# Patient Record
Sex: Male | Born: 1940 | Race: Black or African American | Hispanic: No | Marital: Married | Smoking: Former smoker
Health system: Southern US, Community
[De-identification: ages and names within clinical notes are randomized; demographics above are authoritative.]

## PROBLEM LIST (undated history)

## (undated) DIAGNOSIS — K649 Unspecified hemorrhoids: Secondary | ICD-10-CM

## (undated) DIAGNOSIS — Z9109 Other allergy status, other than to drugs and biological substances: Secondary | ICD-10-CM

## (undated) DIAGNOSIS — K219 Gastro-esophageal reflux disease without esophagitis: Secondary | ICD-10-CM

## (undated) DIAGNOSIS — R351 Nocturia: Secondary | ICD-10-CM

## (undated) DIAGNOSIS — Z87898 Personal history of other specified conditions: Secondary | ICD-10-CM

## (undated) DIAGNOSIS — C61 Malignant neoplasm of prostate: Secondary | ICD-10-CM

## (undated) DIAGNOSIS — N400 Enlarged prostate without lower urinary tract symptoms: Secondary | ICD-10-CM

## (undated) DIAGNOSIS — M1990 Unspecified osteoarthritis, unspecified site: Secondary | ICD-10-CM

## (undated) DIAGNOSIS — R634 Abnormal weight loss: Secondary | ICD-10-CM

## (undated) DIAGNOSIS — IMO0002 Reserved for concepts with insufficient information to code with codable children: Secondary | ICD-10-CM

## (undated) HISTORY — DX: Benign prostatic hyperplasia without lower urinary tract symptoms: N40.0

## (undated) HISTORY — PX: HEMORRHOID BANDING: SHX5850

## (undated) HISTORY — DX: Gastro-esophageal reflux disease without esophagitis: K21.9

## (undated) HISTORY — DX: Abnormal weight loss: R63.4

## (undated) HISTORY — DX: Unspecified hemorrhoids: K64.9

## (undated) HISTORY — DX: Malignant neoplasm of prostate: C61

---

## 2006-10-02 ENCOUNTER — Emergency Department: Payer: Self-pay | Admitting: Unknown Physician Specialty

## 2006-10-02 ENCOUNTER — Other Ambulatory Visit: Payer: Self-pay

## 2013-02-25 ENCOUNTER — Encounter: Payer: Self-pay | Admitting: *Deleted

## 2013-03-07 ENCOUNTER — Ambulatory Visit (INDEPENDENT_AMBULATORY_CARE_PROVIDER_SITE_OTHER): Payer: Medicare Other | Admitting: General Surgery

## 2013-03-07 ENCOUNTER — Encounter: Payer: Self-pay | Admitting: General Surgery

## 2013-03-07 VITALS — BP 128/76 | HR 76 | Resp 14 | Ht 65.0 in | Wt 147.0 lb

## 2013-03-07 DIAGNOSIS — K648 Other hemorrhoids: Secondary | ICD-10-CM

## 2013-03-07 MED ORDER — POLYETHYLENE GLYCOL 3350 17 GM/SCOOP PO POWD: ORAL | Status: DC

## 2013-03-07 NOTE — Patient Instructions (Addendum)
Colonoscopy A colonoscopy is an exam to evaluate your entire colon. In this exam, your colon is cleansed. A long fiberoptic tube is inserted through your rectum and into your colon. The fiberoptic scope (endoscope) is a long bundle of enclosed and very flexible fibers. These fibers transmit light to the area examined and send images from that area to your caregiver. Discomfort is usually minimal. You may be given a drug to help you sleep (sedative) during or prior to the procedure. This exam helps to detect lumps (tumors), polyps, inflammation, and areas of bleeding. Your caregiver may also take a small piece of tissue (biopsy) that will be examined under a microscope. LET YOUR CAREGIVER KNOW ABOUT:   Allergies to food or medicine.  Medicines taken, including vitamins, herbs, eyedrops, over-the-counter medicines, and creams.  Use of steroids (by mouth or creams).  Previous problems with anesthetics or numbing medicines.  History of bleeding problems or blood clots.  Previous surgery.  Other health problems, including diabetes and kidney problems.  Possibility of pregnancy, if this applies. BEFORE THE PROCEDURE   A clear liquid diet may be required for 2 days before the exam.  Ask your caregiver about changing or stopping your regular medications.  Liquid injections (enemas) or laxatives may be required.  A large amount of electrolyte solution may be given to you to drink over a short period of time. This solution is used to clean out your colon.  You should be present 60 minutes prior to your procedure or as directed by your caregiver. AFTER THE PROCEDURE   If you received a sedative or pain relieving medication, you will need to arrange for someone to drive you home.  Occasionally, there is a little blood passed with the first bowel movement. Do not be concerned. FINDING OUT THE RESULTS OF YOUR TEST Not all test results are available during your visit. If your test results are  not back during the visit, make an appointment with your caregiver to find out the results. Do not assume everything is normal if you have not heard from your caregiver or the medical facility. It is important for you to follow up on all of your test results. HOME CARE INSTRUCTIONS   It is not unusual to pass moderate amounts of gas and experience mild abdominal cramping following the procedure. This is due to air being used to inflate your colon during the exam. Walking or a warm pack on your belly (abdomen) may help.  You may resume all normal meals and activities after sedatives and medicines have worn off.  Only take over-the-counter or prescription medicines for pain, discomfort, or fever as directed by your caregiver. Do not use aspirin or blood thinners if a biopsy was taken. Consult your caregiver for medicine usage if biopsies were taken. SEEK IMMEDIATE MEDICAL CARE IF:   You have a fever.  You pass large blood clots or fill a toilet with blood following the procedure. This may also occur 10 to 14 days following the procedure. This is more likely if a biopsy was taken.  You develop abdominal pain that keeps getting worse and cannot be relieved with medicine. Document Released: 07/22/2000 Document Revised: 10/17/2011 Document Reviewed: 03/06/2008 [facility] Patient Information 2014 [place], [place].  Patient has been scheduled for a colonoscopy on 03-13-13 at [facility]. This patient has been asked to discontinue his fish oil today.

## 2013-03-07 NOTE — Progress Notes (Signed)
Patient ID: Keith Bates, male   DOB: March 14, 1941, 72 y.o.   MRN: [id number]  Chief Complaint  Patient presents with  . Other    anal mass    HPI Keith Bates is a 72 y.o. male here for assessment of an anal mass. He has had problems with hemorrhoids for the past 50 years. He states he sometimes gets spotting after wiping. He states that his bowel movements are normally hard. HPI  Past Medical History  Diagnosis Date  . Enlarged prostate     Past Surgical History  Procedure Laterality Date  . Hemorrhoid banding      Family History  Problem Relation Age of Onset  . Prostate cancer Father     Social History History  Substance Use Topics  . Smoking status: Former Smoker    Types: Cigarettes  . Smokeless tobacco: Never Used  . Alcohol Use: No    Allergies  Allergen Reactions  . Penicillins Rash    Current Outpatient Prescriptions  Medication Sig Dispense Refill  . fish oil-omega-3 fatty acids 1000 MG capsule Take 2 g by mouth daily.      . polyethylene glycol powder (GLYCOLAX/MIRALAX) powder 255 grams one bottle for colonoscopy prep  255 g  0   No current facility-administered medications for this visit.    Review of Systems Review of Systems  Constitutional: Negative.   Respiratory: Negative.   Cardiovascular: Negative.     Blood pressure 128/76, pulse 76, resp. rate 14, height 5\' 5"  (1.651 m), weight 147 lb (66.679 kg).  Physical Exam Physical Exam  Constitutional: He is oriented to person, place, and time. He appears well-developed and well-nourished.  Eyes: Conjunctivae are normal. No scleral icterus.  Neck: Neck supple.  Cardiovascular: Normal rate, regular rhythm and normal heart sounds.   Pulses:      Carotid pulses are 2+ on the right side, and 2+ on the left side.      Femoral pulses are 2+ on the right side, and 2+ on the left side.      Popliteal pulses are 2+ on the right side, and 2+ on the left side.  Pulmonary/Chest: Effort normal and breath  sounds normal.  Abdominal: Soft.  Genitourinary: Rectal exam shows internal hemorrhoid (at least 2 , may be 3 , not freely prolapsing.). Prostate is enlarged (nodular.).  Lymphadenopathy:    He has no cervical adenopathy.  Neurological: He is alert and oriented to person, place, and time.    Data Reviewed Dr Keith Bates note  Assessment    Internal hemorrhoids.  Did not see a mass as per prior eval.     Plan    Discussed colonoscopy with pt and he is agreeable.     Patient has been scheduled for a colonoscopy on 03-13-13 at [facility]. This patient has been asked to discontinue his fish oil today.   SANKAR,SEEPLAPUTHUR G 03/08/2013, 11:38 AM

## 2013-03-08 ENCOUNTER — Encounter: Payer: Self-pay | Admitting: General Surgery

## 2013-03-11 ENCOUNTER — Other Ambulatory Visit: Payer: Self-pay | Admitting: General Surgery

## 2013-03-11 DIAGNOSIS — Z1211 Encounter for screening for malignant neoplasm of colon: Secondary | ICD-10-CM

## 2013-03-11 DIAGNOSIS — K648 Other hemorrhoids: Secondary | ICD-10-CM

## 2013-03-13 ENCOUNTER — Ambulatory Visit: Payer: Self-pay | Admitting: General Surgery

## 2013-03-13 DIAGNOSIS — K648 Other hemorrhoids: Secondary | ICD-10-CM

## 2013-03-13 DIAGNOSIS — Z1211 Encounter for screening for malignant neoplasm of colon: Secondary | ICD-10-CM

## 2013-03-13 DIAGNOSIS — D126 Benign neoplasm of colon, unspecified: Secondary | ICD-10-CM

## 2013-03-13 HISTORY — PX: COLONOSCOPY: SHX174

## 2013-03-15 ENCOUNTER — Encounter: Payer: Self-pay | Admitting: General Surgery

## 2013-03-19 ENCOUNTER — Encounter: Payer: Self-pay | Admitting: General Surgery

## 2013-03-20 ENCOUNTER — Telehealth: Payer: Self-pay | Admitting: *Deleted

## 2013-03-20 NOTE — Telephone Encounter (Signed)
Message copied by Currie Paris on Wed Mar 20, 2013  9:57 AM ------      Message from: Kieth Brightly      Created: Tue Mar 19, 2013  7:09 PM       Please let Keith Bates Keith Bates know the pathology was normal on his colon polyp biopsy. Check on f/u appt for his hemorrhoids. ------

## 2013-03-20 NOTE — Telephone Encounter (Signed)
Notified patient as instructed, patient pleased. Discussed follow-up appointments, patient agrees.  appt made for hemorrhoids

## 2013-04-09 ENCOUNTER — Ambulatory Visit (INDEPENDENT_AMBULATORY_CARE_PROVIDER_SITE_OTHER): Payer: Medicare Other | Admitting: General Surgery

## 2013-04-09 ENCOUNTER — Encounter: Payer: Self-pay | Admitting: General Surgery

## 2013-04-09 VITALS — BP 120/68 | Ht 67.0 in | Wt 157.0 lb

## 2013-04-09 DIAGNOSIS — K648 Other hemorrhoids: Secondary | ICD-10-CM

## 2013-04-09 NOTE — Progress Notes (Signed)
Patient ID: Keith Bates, male   DOB: [birth year], 72 y.o.   MRN: [id number]  Chief Complaint  Patient presents with  . Other    hemorrhoids    HPI Keith Bates is a 72 y.o. male. here today for an hemorrhoids check. Patient states he is not bleeding at this time and overall feels better. Patient colonoscopy was normal. HPI  Past Medical History  Diagnosis Date  . Enlarged prostate     Past Surgical History  Procedure Laterality Date  . Hemorrhoid banding    . Colonoscopy  2014    Family History  Problem Relation Age of Onset  . Prostate cancer Father     Social History History  Substance Use Topics  . Smoking status: Former Smoker    Types: Cigarettes  . Smokeless tobacco: Never Used  . Alcohol Use: No    Allergies  Allergen Reactions  . Penicillins Rash    Current Outpatient Prescriptions  Medication Sig Dispense Refill  . fish oil-omega-3 fatty acids 1000 MG capsule Take 2 g by mouth daily.      . polyethylene glycol powder (GLYCOLAX/MIRALAX) powder 255 grams one bottle for colonoscopy prep  255 g  0   No current facility-administered medications for this visit.    Review of Systems Review of Systems  Constitutional: Negative.   Respiratory: Negative.   Cardiovascular: Negative.   Gastrointestinal: Negative.     Blood pressure 120/68, height 5\' 7"  (1.702 m), weight 157 lb (71.215 kg).  Physical Exam Physical Exam  Constitutional: He is oriented to person, place, and time. He appears well-developed and well-nourished.  Genitourinary:  Only one small internal hemorrhoid seen at 4 o'cl location. No other abnormality noted.  Neurological: He is alert and oriented to person, place, and time.  Skin: Skin is warm and dry.    Data Reviewed Path on colon polyp -hyperplastic .  Assessment    Internal hemorrhoids, improved.     Plan     Patient to return as needed.     Abbagayle Zaragoza G 04/09/2013, 11:13 AM

## 2013-04-09 NOTE — Patient Instructions (Addendum)
Patient to return as needed. 

## 2013-04-30 ENCOUNTER — Ambulatory Visit: Payer: Self-pay | Admitting: Urology

## 2013-05-03 ENCOUNTER — Ambulatory Visit: Payer: Self-pay | Admitting: Urology

## 2013-05-03 ENCOUNTER — Telehealth: Payer: Self-pay | Admitting: *Deleted

## 2013-05-03 NOTE — Telephone Encounter (Signed)
Called pt to introduce myself as Prostate Clinic coordinator and to confirm 05/17/13 date.  Line busy.  Will call again on Monday.  Young Berry, RN, BSN, [facility] Prostate Oncology Navigator [phone]

## 2013-05-06 ENCOUNTER — Telehealth: Payer: Self-pay | Admitting: *Deleted

## 2013-05-06 NOTE — Telephone Encounter (Signed)
Called pt to introduce myself and discuss PMDC.  No answer; no VM option. Will try again.  Young Berry, RN, BSN, [facility] Prostate Oncology Navigator [phone]

## 2013-05-07 ENCOUNTER — Telehealth: Payer: Self-pay | Admitting: *Deleted

## 2013-05-07 NOTE — Telephone Encounter (Signed)
Pt returned my call.  Introduced myself, provided an overview of the PMDC, and indicated he would be receiving an Administrator, arts in the mail in the next couple of days.  Will contact pt, again, prior to clinic to answer any questions.  Pt expressed appreciation for information.  Young Berry, RN, BSN, [facility] Prostate Oncology Navigator [phone]

## 2013-05-08 ENCOUNTER — Telehealth: Payer: Self-pay | Admitting: *Deleted

## 2013-05-08 NOTE — Telephone Encounter (Signed)
Pt wife called for appointment clarification.  I also provided directions to [facility].  Young Berry, RN, BSN, [facility] Prostate Oncology Navigator [phone]

## 2013-05-16 ENCOUNTER — Encounter: Payer: Self-pay | Admitting: Radiation Oncology

## 2013-05-16 ENCOUNTER — Telehealth: Payer: Self-pay | Admitting: *Deleted

## 2013-05-16 DIAGNOSIS — Z8546 Personal history of malignant neoplasm of prostate: Secondary | ICD-10-CM | POA: Insufficient documentation

## 2013-05-16 DIAGNOSIS — C61 Malignant neoplasm of prostate: Secondary | ICD-10-CM

## 2013-05-16 NOTE — Progress Notes (Signed)
Referral paperwork very difficult to read  GU Location of Tumor / Histology: adenocarcinoma of prostate  If Prostate Cancer, Gleason Score is (3 + 4=7) and PSA is (66) on 02/25/2013  Patient presented to establish a PCP after ten years without medical care in July of this year. PSA elevated and referral was made to [facility]  Biopsies of prostate (if applicable) revealed:     Past/Anticipated interventions by urology, if any: possible surgery  Past/Anticipated interventions by medical oncology, if any: None  Weight changes, if any: 10 lb over six months (read on another note patient had lost 15 lb within a three year period)  Bowel/Bladder complaints, if any: urinary frequency, stool incontinence, hemorrhoids problems, occasionally sees blood from the rectum   Nausea/Vomiting, if any: None noted   Pain issues, if any:  Pain runs down the back of the legs whenever he lifts items.   SAFETY ISSUES:  Prior radiation? *NO  Pacemaker/ICD? NO  Possible current pregnancy? NO  Is the patient on methotrexate? NO  Current Complaints / other details:  72 year old male. Reports decreased energy. Prostate volume 193 cc. PSA has put in the high risk category. MR of lumbar spine done 05/03/13 showed no evidence of metastatic disease.

## 2013-05-16 NOTE — Telephone Encounter (Signed)
Made multiple attempts to contact patient to remind him of arrival time for clinic but line busy or no answer.  Also LVM on wife's cell to call me.  Young Berry, RN, BSN, [facility] Prostate Oncology Navigator [phone]

## 2013-05-17 ENCOUNTER — Ambulatory Visit
Admission: RE | Admit: 2013-05-17 | Discharge: 2013-05-17 | Disposition: A | Discharge status: Home / Self Care | Payer: Medicare Other | Source: Ambulatory Visit | Attending: Radiation Oncology | Admitting: Radiation Oncology

## 2013-05-17 ENCOUNTER — Encounter: Payer: Self-pay | Admitting: Radiation Oncology

## 2013-05-17 ENCOUNTER — Ambulatory Visit (HOSPITAL_BASED_OUTPATIENT_CLINIC_OR_DEPARTMENT_OTHER): Payer: Medicare Other | Admitting: Oncology

## 2013-05-17 ENCOUNTER — Encounter: Payer: Self-pay | Admitting: *Deleted

## 2013-05-17 VITALS — BP 139/83 | HR 65 | Temp 98.5°F | Resp 18 | Ht 67.0 in | Wt 151.2 lb

## 2013-05-17 DIAGNOSIS — K644 Residual hemorrhoidal skin tags: Secondary | ICD-10-CM | POA: Insufficient documentation

## 2013-05-17 DIAGNOSIS — R300 Dysuria: Secondary | ICD-10-CM | POA: Insufficient documentation

## 2013-05-17 DIAGNOSIS — R61 Generalized hyperhidrosis: Secondary | ICD-10-CM | POA: Insufficient documentation

## 2013-05-17 DIAGNOSIS — C61 Malignant neoplasm of prostate: Secondary | ICD-10-CM | POA: Insufficient documentation

## 2013-05-17 DIAGNOSIS — R32 Unspecified urinary incontinence: Secondary | ICD-10-CM | POA: Insufficient documentation

## 2013-05-17 DIAGNOSIS — L723 Sebaceous cyst: Secondary | ICD-10-CM | POA: Insufficient documentation

## 2013-05-17 NOTE — Progress Notes (Signed)
Please see consult note.  

## 2013-05-17 NOTE — Progress Notes (Signed)
[facility] Clinical Social Work Prostate Clinic Clinical Social Work met with Pt and his wife at prostate clinic for assessment of psychosocial needs and to review Pt's distress screen.  Pt reports a 4 or mild distress on the distress screen. Pt shared he has had some issues with anxiety due to his dx of prostate cancer. CSW reviewed concerns and shared with them both resources/support available to assist.  They both deny concerns or stressors and agree to seek assistance as needed.   Doreen Salvage, LCSW Clinical Social Worker [facility] Wednesday, Thursday and Friday Phone: [phone] Fax: [phone]

## 2013-05-17 NOTE — Progress Notes (Signed)
Radiation Oncology         [phone] ________________________________  Initial outpatient Consultation  Name: Keith Bates MRN: [id number]  Date: 05/17/2013  DOB: 12/14/1940  WU:[id number],NWGNF H, MD  Fidel Levy, MD   REFERRING PHYSICIAN: Fidel Levy, MD  DIAGNOSIS: 72 y.o. 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 gentleman.  He was noted to have an elevated PSA of 66 by his primary care physician, Dr. Veto Kemps on 7/25.  Accordingly, he was referred for evaluation in urology by Dr. Mena Goes,  digital rectal examination was performed at that time revealing possible apical nodule.  The patient proceeded to transrectal ultrasound with 12 biopsies of the prostate on 04/03/13.  The prostate volume measured 193 cc.  Out of 12 core biopsies,2 were positive.  The maximum Gleason score was 3+4, and this was seen in 50% of the right lateral base and 10% of the right lateral mid.  The patient reviewed the biopsy results with his urologist and he has kindly been referred today for discussion of potential radiation treatment options at our multidisciplinary prostate cancer clinic following presentation of slides and x-rays in our conference.  PREVIOUS RADIATION THERAPY: No  PAST MEDICAL HISTORY:  has a past medical history of Enlarged prostate and Prostate cancer.    PAST SURGICAL HISTORY: Past Surgical History  Procedure Laterality Date  . Hemorrhoid banding    . Colonoscopy  03/13/2013    FAMILY HISTORY: family history includes Cancer in his brother; Prostate cancer in his father.  SOCIAL HISTORY:  reports that he has quit smoking. His smoking use included Cigarettes. He smoked 1.50 packs per day. He has never used smokeless tobacco. He reports that he does not drink alcohol or use illicit drugs.  ALLERGIES: Penicillins  MEDICATIONS:  Current Outpatient Prescriptions    Medication Sig Dispense Refill  . phenylephrine-shark liver oil-mineral oil-petrolatum (PREPARATION H) 0.25-3-14-71.9 % rectal ointment Place rectally 2 (two) times daily as needed for hemorrhoids.      . shark liver oil-cocoa butter (PREPARATION H) 0.25-3-85.5 % suppository Place 1 suppository rectally as needed for hemorrhoids.      . fish oil-omega-3 fatty acids 1000 MG capsule Take 2 g by mouth daily.      . polyethylene glycol powder (GLYCOLAX/MIRALAX) powder 255 grams one bottle for colonoscopy prep  255 g  0   No current facility-administered medications for this encounter.    REVIEW OF SYSTEMS:  A 15 point review of systems is documented in the electronic medical record. This was obtained by the nursing staff. However, I reviewed this with the patient to discuss relevant findings and make appropriate changes.  A comprehensive review of systems was negative..  The patient completed an IPSS and IIEF questionnaire.  His IPSS score was 22 indicating severe urinary outflow obstructive symptoms.  He indicated that his erectile function has not been assessed in years.   PHYSICAL EXAM: This patient is in no acute distress.  He is alert and oriented.   height is 5\' 7"  (1.702 m) and weight is 151 lb 3.2 oz (68.584 kg). His oral temperature is 98.5 F (36.9 C). His blood pressure is 139/83 and his pulse is 65. His respiration is 18 and oxygen saturation is 100%.  He exhibits no respiratory distress or labored breathing.  He appears neurologically intact.  His mood is pleasant.  His affect is  appropriate.  Please note the digital rectal exam findings described above.  KPS = 90   - Able to carry on normal activity; minor signs or symptoms of disease.  LABORATORY DATA:  PSA = 66  RADIOGRAPHY:   04/30/2013-CT pelvis severely enlarged and irregular prostate. Periprostatic lymph nodes cannot be excluded.  04/30/2013-bone scan performed in [place] revealed a single focus of increased activity in  the mid lumbar spine potentially representing degenerative disease although MRI was suggested.  05/03/2013-MRI lumbar spine performed in [place] demonstrated no evidence of metastatic disease.     IMPRESSION: This gentleman is a 72 y.o. 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  His T-Stage, Gleason's Score, and PSA put him into the intermediate risk group.  Accordingly he is eligible for a variety of potential treatment options including radical prostatectomy. He is not necessarily an ideal candidate for radiotherapy given the large volume of his gland.  PLAN:Today I reviewed the findings and workup thus far.  We discussed the natural history of prostate cancer.  We reviewed the the implications of T-stage, Gleason's Score, and PSA on decision-making and outcomes related to prostate cancer.  We discussed radiation treatment in the management of prostate cancer with regard to the logistics and delivery of external beam radiation treatment as well as the logistics and delivery of prostate brachytherapy.  We compared and contrasted each of these approaches and also compared these against prostatectomy.    We discussed some of the potential proven indications for postoperative radiotherapy including positive margins, extracapsular extension, and seminal vesicle involvement. We also talked about some of the other potential findings leading to a recommendation for radiotherapy including a non-zero postoperative PSA and positive lymph nodes.   The patient would like to proceed with prostatectomy. I enjoyed meeting with him today, and will look forward to participating in the care of this very nice gentleman.  I will look forward to following his progress   I spent 60 minutes minutes face to face with the patient and more than 50% of that time was spent in counseling and/or coordination of care.    ------------------------------------------------  Artist Pais. Kathrynn Running, M.D.

## 2013-05-17 NOTE — Consult Note (Signed)
Reason for Referral: Prostate cancer.   HPI: 72 year old gentleman native of [place] he lived the majority of his life. He is currently retired and have had multiple occupations in the past. He does have a past medical history that is really unremarkable except for occasional hemorrhoids. Patient is noted to have an elevated PSA of 66 and was evaluated by urology and underwent a prostate biopsy that was done on 04/25/2013 which showed a Gleason score 3+4 equals 7 in 2/12 cores. His staging workup included an MRI of the lumbar spine done on 05/03/2013 which showed severe degenerative changes and spinal stenosis but no evidence of metastatic disease. A bone scan was also done on 04/30/2013 which showed a single focus of increased activity in the mid lumbar vertebra but again the MRIs fail to show metastatic disease. CT scan of the abdomen and pelvis also showed no evidence to suggest metastatic disease.  Clinically, he does report to a lot of symptoms of incomplete emptying, frequency, urgency and straining most of the time. He also reports occasional nocturia at times. He does not report any constitutional symptoms of fevers or chills or sweats. Stat report any weight loss or appetite changes. He still have reasonable performance status and remains active. Does not report any chest pain shortness of breath or difficulty breathing. Is that report any cough or hemoptysis or hematemesis. Does not report any nausea or vomiting or abdominal pain. Does not report any musculoskeletal complaints.   Past Medical History  Diagnosis Date  . Enlarged prostate   . Prostate cancer   :  Past Surgical History  Procedure Laterality Date  . Hemorrhoid banding    . Colonoscopy  03/13/2013  :   Current Outpatient Prescriptions  Medication Sig Dispense Refill  . fish oil-omega-3 fatty acids 1000 MG capsule Take 2 g by mouth daily.      . phenylephrine-shark liver oil-mineral oil-petrolatum (PREPARATION  H) 0.25-3-14-71.9 % rectal ointment Place rectally 2 (two) times daily as needed for hemorrhoids.      . polyethylene glycol powder (GLYCOLAX/MIRALAX) powder 255 grams one bottle for colonoscopy prep  255 g  0  . shark liver oil-cocoa butter (PREPARATION H) 0.25-3-85.5 % suppository Place 1 suppository rectally as needed for hemorrhoids.       No current facility-administered medications for this visit.      Allergies  Allergen Reactions  . Penicillins Rash  :  Family History  Problem Relation Age of Onset  . Prostate cancer Father     with mets to nodes  . Cancer Brother     colon and prostate  :  History   Social History  . Marital Status: Married    Spouse Name: N/A    Number of Children: N/A  . Years of Education: N/A   Occupational History  . Not on file.   Social History Main Topics  . Smoking status: Former Smoker -- 1.50 packs/day    Types: Cigarettes  . Smokeless tobacco: Never Used  . Alcohol Use: No  . Drug Use: No  . Sexual Activity: Not Currently   Other Topics Concern  . Not on file   Social History Narrative  . No narrative on file  :  Pertinent items are noted in HPI.  Exam: ECOG 0 There were no vitals taken for this visit. General appearance: alert, cooperative and appears stated age Head: Normocephalic, without obvious abnormality, atraumatic Nose: Nares normal. Septum midline. Mucosa normal. No drainage or sinus tenderness. Throat:  lips, mucosa, and tongue normal; teeth and gums normal Back: symmetric, no curvature. ROM normal. No CVA tenderness. Resp: clear to auscultation bilaterally Chest wall: no tenderness Cardio: regular rate and rhythm, S1, S2 normal, no murmur, click, rub or gallop GI: soft, non-tender; bowel sounds normal; no masses,  no organomegaly Extremities: extremities normal, atraumatic, no cyanosis or edema Pulses: 2+ and symmetric Skin: Skin color, texture, turgor normal. No rashes or lesions Lymph nodes: Cervical,  supraclavicular, and axillary nodes normal. Neurologic: Grossly normal  Radiology:  His CT scan, bone scan, MRI were reviewed today with radiology as well as with the patient personally.  Pathology: His pathology slides were reviewed today with the reviewing pathologists.   Assessment and Plan:   72 year old gentleman with the diagnosis of prostate cancer presented with a PSA of 66 and a Gleason score 3+4 equals 7 involving 2/12 cores. Options of treatments were discussed today with Mr. Crystal as a part of the prostate cancer multidisciplinary clinic. The natural course of prostate cancer in general was discussed today in detail multiple modalities include radiation therapy, surgical resection, hormone therapy as well as the role of systemic therapy for advanced disease were discussed today.  During the can find it nature of his disease we feel that surgical resection would be his best option with consideration of postop radiation depending on the final pathological findings.  I discussed with him the role of systemic therapy which will be only used it he has developed advanced disease at this time without any role for any adjuvant therapy.  All his questions are answered today to his satisfaction.

## 2013-05-17 NOTE — Progress Notes (Signed)
Patient reports he has 3 cysts on his lower back. He reports these cyst have been present since June. Reports that he has external hemorrhoids that "pop out, swell but, rarely bleed." Patient reports that he uses preparation H to relieve his hemorrhoids. Reports he has night sweats. Reports that he has sinus problems and dentures. Reports that he dribbles urine, suffers from incontinence, and has pain associated with urination. Denies hematuria. Reports back pain. IPSS 25.

## 2013-05-20 NOTE — Progress Notes (Signed)
Met with patient as part of Prostate [facility].  Reintroduced my role as his navigator and encouraged him to call as he proceeds with treatments and appointments at [facility].  Provided the accompanying Care Plan Summary:    Prostate MDC Care Plan Summary  Name: Keith Bates DOB: 03/09/1941  Your Medical Team:   Urologist - Dr. Sebastian Ache, [facility] Hageman[facility]  Radiation Oncologist - Dr. Margaretmary Dys  Medical Oncologist - Dr. Eli Hose  Recommendations: 1) Prostatectomy (removal of prostate gland). * These recommendations are based on information available as of today's consult.      Recommendations may change depending on the results of further tests or exams.  Next Steps: 1) Scheduling of surgery by [facility].  When appointments need to be scheduled, you will be contacted by [facility] and/or [facility].      Questions? Please do not hesitate to call Young Berry, RN, BSN, [facility] at [phone] with any questions or concerns.  Raiford Noble is Radio broadcast assistant and is available to assist you while you're receiving your medical care at [facility].  Young Berry, RN, BSN, [facility] Prostate Oncology Navigator [phone]

## 2013-05-28 ENCOUNTER — Other Ambulatory Visit: Payer: Self-pay | Admitting: Urology

## 2013-06-03 ENCOUNTER — Other Ambulatory Visit: Payer: Self-pay | Admitting: Urology

## 2013-06-06 ENCOUNTER — Ambulatory Visit (INDEPENDENT_AMBULATORY_CARE_PROVIDER_SITE_OTHER): Payer: Medicare Other | Admitting: Cardiovascular Disease

## 2013-06-06 ENCOUNTER — Encounter: Payer: Self-pay | Admitting: Cardiovascular Disease

## 2013-06-06 VITALS — BP 120/88 | HR 85 | Ht 67.0 in | Wt 151.0 lb

## 2013-06-06 DIAGNOSIS — C61 Malignant neoplasm of prostate: Secondary | ICD-10-CM

## 2013-06-06 DIAGNOSIS — Z01810 Encounter for preprocedural cardiovascular examination: Secondary | ICD-10-CM

## 2013-06-06 DIAGNOSIS — R9431 Abnormal electrocardiogram [ECG] [EKG]: Secondary | ICD-10-CM | POA: Insufficient documentation

## 2013-06-06 NOTE — Assessment & Plan Note (Signed)
Nonspecific ST and T wave abnormality seen on recent EKGs. Similar changes noted on EKG in 2008. He is relatively asymptomatic, very active at baseline. Denies any symptoms concerning for angina. Given EKG changes were noted previously, given his very active lifestyle, minimal risk factors (minimal smoking, nondiabetic, no significant family history), would suggest he would be acceptable risk for upcoming prostatectomy. Would hold off on any further testing at this time. We have recommended to him that if he has symptoms in the future of shortness of breath or chest pain with exertion, that he contact our office.

## 2013-06-06 NOTE — Patient Instructions (Signed)
You are doing well. No medication changes were made.  Please call us if you have new issues that need to be addressed before your next appt.    

## 2013-06-06 NOTE — Assessment & Plan Note (Addendum)
He would be acceptable risk for upcoming prostatectomy surgery. No further testing needed at this time.

## 2013-06-06 NOTE — Assessment & Plan Note (Signed)
Scheduled to have surgery in 2 weeks' time at [facility].

## 2013-06-06 NOTE — Progress Notes (Signed)
Patient ID: Keith Bates, male    DOB: [birth year], 72 y.o.   MRN: [id number]  HPI Comments: Keith Bates is a very pleasant 72 year old gentleman, patient of Dr. Juanetta Gosling, recent diagnosis of prostate cancer, remote history of smoking when he was young who presents by referral for evaluation of abnormal EKG and preoperative evaluation prior to prostatectomy.  He reports that overall he is reactive, denies any significant symptoms of shortness of breath or chest pain. In fact he push mows several lawns on a regular basis. Within the past week mowed for 3 hours. No significant symptoms during that time. He is able to lift heavy items, reports lifting refrigerators onto the back of a truck, chopping wood getting ready for the winter. Only complaint he has is occasional GERD symptoms. Also had anxiety while in the MRI machine, had an upset stomach.   He reports having no history of cardiac disease. No significant family history of CAD Notes indicate weight loss since August 2014 of 8 pounds EKG performed by Dr. Juanetta Gosling shows nonspecific ST abnormality in V5 and V6, leads 3 and aVF  He reports having workup in the hospital in 2008 with Dr.CAllwood with echocardiogram and stress test at that time. By his report, these were normal. EKG in 2008 showed nonspecific T wave abnormality in leads V4 through V6, lead 3 and aVF EKG today shows nonspecific ST and T wave abnormality in leads V4 through V6, 2, 3, aVF, unable to exclude repolarization abnormality   Outpatient Encounter Prescriptions as of 06/06/2013  Medication Sig Dispense Refill  . fluticasone (FLONASE) 50 MCG/ACT nasal spray Place 2 sprays into the nose daily.      . phenylephrine-shark liver oil-mineral oil-petrolatum (PREPARATION H) 0.25-3-14-71.9 % rectal ointment Place rectally 2 (two) times daily as needed for hemorrhoids.      . polyethylene glycol powder (GLYCOLAX/MIRALAX) powder 255 grams one bottle for colonoscopy prep  255 g  0  . shark  liver oil-cocoa butter (PREPARATION H) 0.25-3-85.5 % suppository Place 1 suppository rectally as needed for hemorrhoids.        Review of Systems  Constitutional: Negative.   HENT: Negative.   Eyes: Negative.   Respiratory: Negative.   Cardiovascular: Negative.   Gastrointestinal: Negative.   Endocrine: Negative.   Musculoskeletal: Negative.   Skin: Negative.   Allergic/Immunologic: Negative.   Neurological: Negative.   Hematological: Negative.   Psychiatric/Behavioral: Negative.   All other systems reviewed and are negative.    BP 120/88  Pulse 85  Ht 5\' 7"  (1.702 m)  Wt 151 lb (68.493 kg)  BMI 23.64 kg/m2  Physical Exam  Nursing note and vitals reviewed. Constitutional: He is oriented to person, place, and time. He appears well-developed and well-nourished.  HENT:  Head: Normocephalic.  Nose: Nose normal.  Mouth/Throat: Oropharynx is clear and moist.  Eyes: Conjunctivae are normal. Pupils are equal, round, and reactive to light.  Neck: Normal range of motion. Neck supple. No JVD present.  Cardiovascular: Normal rate, regular rhythm, S1 normal, S2 normal, normal heart sounds and intact distal pulses.  Exam reveals no gallop and no friction rub.   No murmur heard. Pulmonary/Chest: Effort normal and breath sounds normal. No respiratory distress. He has no wheezes. He has no rales. He exhibits no tenderness.  Abdominal: Soft. Bowel sounds are normal. He exhibits no distension. There is no tenderness.  Musculoskeletal: Normal range of motion. He exhibits no edema and no tenderness.  Lymphadenopathy:    He has no cervical adenopathy.  Neurological: He is alert and oriented to person, place, and time. Coordination normal.  Skin: Skin is warm and dry. No rash noted. No erythema.  Psychiatric: He has a normal mood and affect. His behavior is normal. Judgment and thought content normal.      Assessment and Plan

## 2013-06-10 ENCOUNTER — Encounter: Payer: Self-pay | Admitting: *Deleted

## 2013-06-11 ENCOUNTER — Encounter: Payer: Self-pay | Admitting: *Deleted

## 2013-06-13 ENCOUNTER — Other Ambulatory Visit (HOSPITAL_COMMUNITY): Payer: Self-pay | Admitting: *Deleted

## 2013-06-13 ENCOUNTER — Encounter (HOSPITAL_COMMUNITY): Payer: Self-pay

## 2013-06-14 ENCOUNTER — Other Ambulatory Visit (HOSPITAL_COMMUNITY): Payer: Self-pay | Admitting: Urology

## 2013-06-17 ENCOUNTER — Encounter (HOSPITAL_COMMUNITY): Payer: Self-pay

## 2013-06-17 ENCOUNTER — Encounter (HOSPITAL_COMMUNITY): Payer: Self-pay | Admitting: Pharmacy Technician

## 2013-06-17 ENCOUNTER — Encounter (HOSPITAL_COMMUNITY)
Admission: RE | Admit: 2013-06-17 | Discharge: 2013-06-17 | Disposition: A | Discharge status: Home / Self Care | Payer: Medicare Other | Source: Ambulatory Visit | Attending: Urology | Admitting: Urology

## 2013-06-17 DIAGNOSIS — Z01812 Encounter for preprocedural laboratory examination: Secondary | ICD-10-CM | POA: Insufficient documentation

## 2013-06-17 HISTORY — DX: Unspecified osteoarthritis, unspecified site: M19.90

## 2013-06-17 HISTORY — DX: Reserved for concepts with insufficient information to code with codable children: IMO0002

## 2013-06-17 HISTORY — DX: Personal history of other specified conditions: Z87.898

## 2013-06-17 HISTORY — DX: Nocturia: R35.1

## 2013-06-17 HISTORY — DX: Other allergy status, other than to drugs and biological substances: Z91.09

## 2013-06-17 LAB — BASIC METABOLIC PANEL
BUN: 14 mg/dL (ref 6–23)
CO2: 30 mEq/L (ref 19–32)
Calcium: 10.5 mg/dL (ref 8.4–10.5)
Creatinine, Ser: 0.92 mg/dL (ref 0.50–1.35)
GFR calc non Af Amer: 83 mL/min — ABNORMAL LOW (ref >90)
Glucose, Bld: 108 mg/dL — ABNORMAL HIGH (ref 70–99)
Potassium: 4.8 mEq/L (ref 3.5–5.1)
Sodium: 138 mEq/L (ref 135–145)

## 2013-06-17 LAB — CBC
Hemoglobin: 15.5 g/dL (ref 13.0–17.0)
MCH: 31.3 pg (ref 26.0–34.0)
MCHC: 33.5 g/dL (ref 30.0–36.0)
Platelets: 247 10*3/uL (ref 150–400)
RBC: 4.95 MIL/uL (ref 4.22–5.81)
RDW: 13 % (ref 11.5–15.5)

## 2013-06-17 NOTE — Patient Instructions (Addendum)
Keith Bates  06/17/2013                           YOUR PROCEDURE IS SCHEDULED ON:  06/21/13               PLEASE REPORT TO SHORT STAY CENTER AT :  8:30 am               CALL THIS NUMBER IF ANY PROBLEMS THE DAY OF SURGERY :               [phone]                      REMEMBER:   Do not eat food or drink liquids AFTER MIDNIGHT   Take these medicines the morning of surgery with A SIP OF WATER: PRILOSEC / CLARITIN / FLONASE NASAL SPRAY   Do not wear jewelry, make-up   Do not wear lotions, powders, or perfumes.   Do not shave legs or underarms 12 hrs. before surgery (men may shave face)  Do not bring valuables to the hospital.  Contacts, dentures or bridgework may not be worn into surgery.  Leave suitcase in the car. After surgery it may be brought to your room.  For patients admitted to the hospital more than one night, checkout time is 11:00                          The day of discharge.   Patients discharged the day of surgery will not be allowed to drive home                             If going home same day of surgery, must have someone stay with you first                           24 hrs at home and arrange for some one to drive you home from hospital.    Special Instructions:   Please read over the following fact sheets that you were given:                 1. [place] PREPARING FOR SURGERY SHEET                                                X_____________________________________________________________________        Failure to follow these instructions may result in cancellation of your surgery

## 2013-06-18 LAB — ABO/RH: ABO/RH(D): O POS

## 2013-06-21 ENCOUNTER — Encounter (HOSPITAL_COMMUNITY)
Admission: RE | Disposition: A | Discharge status: Home / Self Care | Payer: Self-pay | Source: Ambulatory Visit | Attending: Urology

## 2013-06-21 ENCOUNTER — Encounter (HOSPITAL_COMMUNITY): Payer: Self-pay | Admitting: *Deleted

## 2013-06-21 ENCOUNTER — Inpatient Hospital Stay (HOSPITAL_COMMUNITY): Payer: Medicare Other | Admitting: Anesthesiology

## 2013-06-21 ENCOUNTER — Inpatient Hospital Stay (HOSPITAL_COMMUNITY)
Admission: RE | Admit: 2013-06-21 | Discharge: 2013-07-09 | DRG: 707 | Disposition: A | Discharge status: Home / Self Care | Payer: Medicare Other | Source: Ambulatory Visit | Attending: Urology | Admitting: Urology

## 2013-06-21 ENCOUNTER — Encounter (HOSPITAL_COMMUNITY): Payer: Medicare Other | Admitting: Anesthesiology

## 2013-06-21 DIAGNOSIS — E46 Unspecified protein-calorie malnutrition: Secondary | ICD-10-CM | POA: Diagnosis present

## 2013-06-21 DIAGNOSIS — Z87891 Personal history of nicotine dependence: Secondary | ICD-10-CM

## 2013-06-21 DIAGNOSIS — IMO0002 Reserved for concepts with insufficient information to code with codable children: Secondary | ICD-10-CM | POA: Diagnosis not present

## 2013-06-21 DIAGNOSIS — N390 Urinary tract infection, site not specified: Secondary | ICD-10-CM | POA: Diagnosis not present

## 2013-06-21 DIAGNOSIS — R000 Tachycardia, unspecified: Secondary | ICD-10-CM | POA: Diagnosis present

## 2013-06-21 DIAGNOSIS — K219 Gastro-esophageal reflux disease without esophagitis: Secondary | ICD-10-CM | POA: Diagnosis present

## 2013-06-21 DIAGNOSIS — E872 Acidosis, unspecified: Secondary | ICD-10-CM | POA: Diagnosis present

## 2013-06-21 DIAGNOSIS — Z880 Allergy status to penicillin: Secondary | ICD-10-CM

## 2013-06-21 DIAGNOSIS — Y832 Surgical operation with anastomosis, bypass or graft as the cause of abnormal reaction of the patient, or of later complication, without mention of misadventure at the time of the procedure: Secondary | ICD-10-CM | POA: Diagnosis not present

## 2013-06-21 DIAGNOSIS — N529 Male erectile dysfunction, unspecified: Secondary | ICD-10-CM | POA: Diagnosis present

## 2013-06-21 DIAGNOSIS — C61 Malignant neoplasm of prostate: Principal | ICD-10-CM | POA: Diagnosis present

## 2013-06-21 DIAGNOSIS — Z8249 Family history of ischemic heart disease and other diseases of the circulatory system: Secondary | ICD-10-CM

## 2013-06-21 DIAGNOSIS — N9989 Other postprocedural complications and disorders of genitourinary system: Secondary | ICD-10-CM | POA: Diagnosis not present

## 2013-06-21 DIAGNOSIS — K560 Paralytic ileus: Secondary | ICD-10-CM | POA: Diagnosis not present

## 2013-06-21 DIAGNOSIS — Z8042 Family history of malignant neoplasm of prostate: Secondary | ICD-10-CM

## 2013-06-21 DIAGNOSIS — K929 Disease of digestive system, unspecified: Secondary | ICD-10-CM | POA: Diagnosis not present

## 2013-06-21 HISTORY — PX: LYMPHADENECTOMY: SHX5960

## 2013-06-21 HISTORY — PX: ROBOT ASSISTED LAPAROSCOPIC RADICAL PROSTATECTOMY: SHX5141

## 2013-06-21 LAB — CBC
HCT: 42.3 % (ref 39.0–52.0)
Hemoglobin: 14.3 g/dL (ref 13.0–17.0)
MCH: 31.6 pg (ref 26.0–34.0)
Platelets: 242 10*3/uL (ref 150–400)
RBC: 4.53 MIL/uL (ref 4.22–5.81)
WBC: 19.9 10*3/uL — ABNORMAL HIGH (ref 4.0–10.5)

## 2013-06-21 LAB — HEMOGLOBIN AND HEMATOCRIT, BLOOD: Hemoglobin: 14.5 g/dL (ref 13.0–17.0)

## 2013-06-21 LAB — TYPE AND SCREEN
ABO/RH(D): O POS
Antibody Screen: NEGATIVE

## 2013-06-21 LAB — CREATININE, SERUM
Creatinine, Ser: 1.02 mg/dL (ref 0.50–1.35)
GFR calc Af Amer: 83 mL/min — ABNORMAL LOW (ref >90)

## 2013-06-21 SURGERY — ROBOTIC ASSISTED LAPAROSCOPIC RADICAL PROSTATECTOMY
Anesthesia: General | Site: Prostate | Wound class: Clean Contaminated

## 2013-06-21 MED ORDER — LIDOCAINE HCL (CARDIAC) 20 MG/ML IV SOLN
INTRAVENOUS | Status: DC | PRN
  Administered 2013-06-21: 100 mg via INTRAVENOUS

## 2013-06-21 MED ORDER — LABETALOL HCL 5 MG/ML IV SOLN
INTRAVENOUS | Status: DC | PRN
  Administered 2013-06-21: 2.5 mg via INTRAVENOUS

## 2013-06-21 MED ORDER — CLINDAMYCIN PHOSPHATE 900 MG/50ML IV SOLN
900.0000 mg | Freq: Once | INTRAVENOUS | Status: AC
  Administered 2013-06-21: 900 mg via INTRAVENOUS

## 2013-06-21 MED ORDER — LACTATED RINGERS IV SOLN
INTRAVENOUS | Status: DC
  Administered 2013-06-21: 14:00:00 via INTRAVENOUS
  Administered 2013-06-21: 1000 mL via INTRAVENOUS

## 2013-06-21 MED ORDER — CIPROFLOXACIN HCL 500 MG PO TABS: 500.0000 mg | ORAL_TABLET | Freq: Two times a day (BID) | ORAL | Status: DC

## 2013-06-21 MED ORDER — HEPARIN SODIUM (PORCINE) 5000 UNIT/ML IJ SOLN
5000.0000 [IU] | Freq: Three times a day (TID) | INTRAMUSCULAR | Status: DC
  Administered 2013-06-22 – 2013-07-09 (×52): 5000 [IU] via SUBCUTANEOUS
  Filled 2013-06-21 (×58): qty 1

## 2013-06-21 MED ORDER — HYDROMORPHONE HCL PF 1 MG/ML IJ SOLN
0.2500 mg | INTRAMUSCULAR | Status: DC | PRN
  Administered 2013-06-21 (×2): 0.5 mg via INTRAVENOUS

## 2013-06-21 MED ORDER — ONDANSETRON HCL 4 MG/2ML IJ SOLN
INTRAMUSCULAR | Status: DC | PRN
  Administered 2013-06-21: 4 mg via INTRAVENOUS

## 2013-06-21 MED ORDER — CISATRACURIUM BESYLATE (PF) 10 MG/5ML IV SOLN
INTRAVENOUS | Status: DC | PRN
  Administered 2013-06-21: 6 mg via INTRAVENOUS
  Administered 2013-06-21: 4 mg via INTRAVENOUS
  Administered 2013-06-21: 14 mg via INTRAVENOUS
  Administered 2013-06-21 (×2): 4 mg via INTRAVENOUS

## 2013-06-21 MED ORDER — BUPIVACAINE LIPOSOME 1.3 % IJ SUSP
20.0000 mL | Freq: Once | INTRAMUSCULAR | Status: AC
  Administered 2013-06-21: 20 mL
  Filled 2013-06-21: qty 20

## 2013-06-21 MED ORDER — CLINDAMYCIN PHOSPHATE 900 MG/50ML IV SOLN
INTRAVENOUS | Status: AC
Start: 2013-06-21 — End: 2013-06-21
  Filled 2013-06-21: qty 50

## 2013-06-21 MED ORDER — PANTOPRAZOLE SODIUM 40 MG PO TBEC
40.0000 mg | DELAYED_RELEASE_TABLET | Freq: Every day | ORAL | Status: DC
  Administered 2013-06-22 – 2013-06-25 (×4): 40 mg via ORAL
  Filled 2013-06-21 (×6): qty 1

## 2013-06-21 MED ORDER — HYDROMORPHONE HCL PF 1 MG/ML IJ SOLN
INTRAMUSCULAR | Status: AC
  Filled 2013-06-21: qty 1

## 2013-06-21 MED ORDER — MEPERIDINE HCL 50 MG/ML IJ SOLN: 6.2500 mg | INTRAMUSCULAR | Status: DC | PRN

## 2013-06-21 MED ORDER — HYDROCODONE-ACETAMINOPHEN 5-325 MG PO TABS: 1.0000 | ORAL_TABLET | Freq: Four times a day (QID) | ORAL | Status: DC | PRN

## 2013-06-21 MED ORDER — MORPHINE SULFATE 2 MG/ML IJ SOLN
2.0000 mg | INTRAMUSCULAR | Status: DC | PRN
  Administered 2013-06-23 – 2013-06-27 (×4): 2 mg via INTRAVENOUS
  Administered 2013-06-27: 4 mg via INTRAVENOUS
  Administered 2013-06-29 – 2013-07-03 (×5): 2 mg via INTRAVENOUS
  Filled 2013-06-21: qty 1
  Filled 2013-06-21: qty 2
  Filled 2013-06-21 (×8): qty 1

## 2013-06-21 MED ORDER — NEOSTIGMINE METHYLSULFATE 1 MG/ML IJ SOLN
INTRAMUSCULAR | Status: DC | PRN
  Administered 2013-06-21: 4 mg via INTRAVENOUS

## 2013-06-21 MED ORDER — KETOROLAC TROMETHAMINE 15 MG/ML IJ SOLN
15.0000 mg | Freq: Four times a day (QID) | INTRAMUSCULAR | Status: AC
  Administered 2013-06-21 – 2013-06-23 (×6): 15 mg via INTRAVENOUS
  Filled 2013-06-21 (×6): qty 1

## 2013-06-21 MED ORDER — EPHEDRINE SULFATE 50 MG/ML IJ SOLN
INTRAMUSCULAR | Status: DC | PRN
  Administered 2013-06-21: 10 mg via INTRAVENOUS
  Administered 2013-06-21 (×2): 5 mg via INTRAVENOUS

## 2013-06-21 MED ORDER — PROPOFOL 10 MG/ML IV BOLUS
INTRAVENOUS | Status: DC | PRN
  Administered 2013-06-21: 170 mg via INTRAVENOUS

## 2013-06-21 MED ORDER — HYDROCODONE-ACETAMINOPHEN 5-325 MG PO TABS
1.0000 | ORAL_TABLET | ORAL | Status: DC | PRN
  Administered 2013-06-23: 1 via ORAL
  Administered 2013-06-24 – 2013-07-08 (×10): 2 via ORAL
  Filled 2013-06-21 (×4): qty 2
  Filled 2013-06-21: qty 1
  Filled 2013-06-21 (×6): qty 2

## 2013-06-21 MED ORDER — PROMETHAZINE HCL 25 MG/ML IJ SOLN: 6.2500 mg | INTRAMUSCULAR | Status: DC | PRN

## 2013-06-21 MED ORDER — LACTATED RINGERS IV SOLN: INTRAVENOUS | Status: DC

## 2013-06-21 MED ORDER — CIPROFLOXACIN IN D5W 400 MG/200ML IV SOLN
INTRAVENOUS | Status: AC
  Filled 2013-06-21: qty 200

## 2013-06-21 MED ORDER — ACETAMINOPHEN 325 MG PO TABS
650.0000 mg | ORAL_TABLET | ORAL | Status: DC | PRN
  Administered 2013-06-22: 650 mg via ORAL
  Filled 2013-06-21: qty 2

## 2013-06-21 MED ORDER — LACTATED RINGERS IR SOLN
Status: DC | PRN
  Administered 2013-06-21: 1000 mL

## 2013-06-21 MED ORDER — SUFENTANIL CITRATE 50 MCG/ML IV SOLN
INTRAVENOUS | Status: DC | PRN
  Administered 2013-06-21: 10 ug via INTRAVENOUS
  Administered 2013-06-21: 20 ug via INTRAVENOUS
  Administered 2013-06-21 (×2): 10 ug via INTRAVENOUS

## 2013-06-21 MED ORDER — DEXTROSE-NACL 5-0.45 % IV SOLN
INTRAVENOUS | Status: DC
  Administered 2013-06-21: 1000 mL via INTRAVENOUS
  Administered 2013-06-22 (×2): via INTRAVENOUS

## 2013-06-21 MED ORDER — MIDAZOLAM HCL 5 MG/5ML IJ SOLN
INTRAMUSCULAR | Status: DC | PRN
  Administered 2013-06-21: 2 mg via INTRAVENOUS

## 2013-06-21 MED ORDER — CIPROFLOXACIN IN D5W 400 MG/200ML IV SOLN
400.0000 mg | Freq: Two times a day (BID) | INTRAVENOUS | Status: DC
  Administered 2013-06-21: 400 mg via INTRAVENOUS

## 2013-06-21 MED ORDER — GLYCOPYRROLATE 0.2 MG/ML IJ SOLN
INTRAMUSCULAR | Status: DC | PRN
  Administered 2013-06-21: .8 mg via INTRAVENOUS

## 2013-06-21 MED ORDER — DEXAMETHASONE SODIUM PHOSPHATE 10 MG/ML IJ SOLN
INTRAMUSCULAR | Status: DC | PRN
  Administered 2013-06-21: 10 mg via INTRAVENOUS

## 2013-06-21 MED ORDER — SODIUM CHLORIDE 0.9 % IV BOLUS (SEPSIS)
1000.0000 mL | Freq: Once | INTRAVENOUS | Status: AC
Start: 2013-06-21 — End: 2013-06-21
  Administered 2013-06-21: 1000 mL via INTRAVENOUS

## 2013-06-21 MED ORDER — HYDROMORPHONE HCL PF 1 MG/ML IJ SOLN
INTRAMUSCULAR | Status: DC | PRN
  Administered 2013-06-21: .5 mg via INTRAVENOUS
  Administered 2013-06-21 (×2): 0.5 mg via INTRAVENOUS
  Administered 2013-06-21: .5 mg via INTRAVENOUS

## 2013-06-21 SURGICAL SUPPLY — 54 items
CABLE HIGH FREQUENCY MONO STRZ (ELECTRODE) ×3 IMPLANT
CANISTER SUCTION 2500CC (MISCELLANEOUS) IMPLANT
CATH FOLEY 2WAY SLVR 18FR 30CC (CATHETERS) ×3 IMPLANT
CATH TIEMANN FOLEY 18FR 5CC (CATHETERS) ×3 IMPLANT
CHLORAPREP W/TINT 26ML (MISCELLANEOUS) ×3 IMPLANT
CLIP LIGATING HEM O LOK PURPLE (MISCELLANEOUS) ×9 IMPLANT
CLIP LIGATING HEMO LOK XL GOLD (MISCELLANEOUS) ×3 IMPLANT
CONT SPECI 4OZ STER CLIK (MISCELLANEOUS) ×3 IMPLANT
COVER SURGICAL LIGHT HANDLE (MISCELLANEOUS) ×3 IMPLANT
COVER TIP SHEARS 8 DVNC (MISCELLANEOUS) ×2 IMPLANT
COVER TIP SHEARS 8MM DA VINCI (MISCELLANEOUS) ×1
CUTTER ECHEON FLEX ENDO 45 340 (ENDOMECHANICALS) ×3 IMPLANT
DECANTER SPIKE VIAL GLASS SM (MISCELLANEOUS) ×3 IMPLANT
DERMABOND ADVANCED (GAUZE/BANDAGES/DRESSINGS) ×1
DERMABOND ADVANCED .7 DNX12 (GAUZE/BANDAGES/DRESSINGS) ×2 IMPLANT
DRAPE SURG IRRIG POUCH 19X23 (DRAPES) ×3 IMPLANT
DRSG TEGADERM 2-3/8X2-3/4 SM (GAUZE/BANDAGES/DRESSINGS) ×12 IMPLANT
DRSG TEGADERM 4X4.75 (GAUZE/BANDAGES/DRESSINGS) ×9 IMPLANT
DRSG TEGADERM 6X8 (GAUZE/BANDAGES/DRESSINGS) ×6 IMPLANT
ELECT REM PT RETURN 9FT ADLT (ELECTROSURGICAL) ×3
ELECTRODE REM PT RTRN 9FT ADLT (ELECTROSURGICAL) ×2 IMPLANT
GAUZE SPONGE 2X2 8PLY STRL LF (GAUZE/BANDAGES/DRESSINGS) ×4 IMPLANT
GLOVE BIO SURGEON STRL SZ 6.5 (GLOVE) ×3 IMPLANT
GLOVE BIOGEL M STRL SZ7.5 (GLOVE) ×9 IMPLANT
GOWN PREVENTION PLUS LG XLONG (DISPOSABLE) ×6 IMPLANT
GOWN STRL REIN XL XLG (GOWN DISPOSABLE) ×12 IMPLANT
HOLDER FOLEY CATH W/STRAP (MISCELLANEOUS) ×3 IMPLANT
IV LACTATED RINGERS 1000ML (IV SOLUTION) ×3 IMPLANT
KIT ACCESSORY DA VINCI DISP (KITS) ×1
KIT ACCESSORY DVNC DISP (KITS) ×2 IMPLANT
KIT PROCEDURE DA VINCI SI (MISCELLANEOUS) ×1
KIT PROCEDURE DVNC SI (MISCELLANEOUS) ×2 IMPLANT
NEEDLE INSUFFLATION 14GA 120MM (NEEDLE) ×3 IMPLANT
NEEDLE SPNL 22GX7 SPINOC (NEEDLE) ×3 IMPLANT
PACK ROBOT UROLOGY CUSTOM (CUSTOM PROCEDURE TRAY) ×3 IMPLANT
POUCH ENDO CATCH II 15MM (MISCELLANEOUS) ×3 IMPLANT
RELOAD GREEN ECHELON 45 (STAPLE) ×3 IMPLANT
SET TUBE IRRIG SUCTION NO TIP (IRRIGATION / IRRIGATOR) ×3 IMPLANT
SOLUTION ELECTROLUBE (MISCELLANEOUS) ×3 IMPLANT
SPONGE GAUZE 2X2 STER 10/PKG (GAUZE/BANDAGES/DRESSINGS) ×2
SPONGE LAP 4X18 X RAY DECT (DISPOSABLE) ×3 IMPLANT
SUT ETHILON 3 0 PS 1 (SUTURE) IMPLANT
SUT MNCRL AB 4-0 PS2 18 (SUTURE) ×6 IMPLANT
SUT PDS AB 1 CT1 27 (SUTURE) ×6 IMPLANT
SUT SILK 2 0 30  PSL (SUTURE) ×1
SUT SILK 2 0 30 PSL (SUTURE) ×2 IMPLANT
SUT VICRYL 0 UR6 27IN ABS (SUTURE) IMPLANT
SUT VLOC BARB 180 ABS3/0GR12 (SUTURE) ×9
SUTURE VLOC BRB 180 ABS3/0GR12 (SUTURE) ×6 IMPLANT
SYR 27GX1/2 1ML LL SAFETY (SYRINGE) ×3 IMPLANT
TOWEL OR NON WOVEN STRL DISP B (DISPOSABLE) ×3 IMPLANT
TROCAR 12M 150ML BLUNT (TROCAR) ×3 IMPLANT
TUBE FEEDING 8FR 16IN STR KANG (MISCELLANEOUS) ×3 IMPLANT
WATER STERILE IRR 1500ML POUR (IV SOLUTION) ×6 IMPLANT

## 2013-06-21 NOTE — Anesthesia Procedure Notes (Signed)
Procedure Name: Intubation Date/Time: 06/21/2013 11:59 AM Performed by: Leroy Libman L Patient Re-evaluated:Patient Re-evaluated prior to inductionOxygen Delivery Method: Circle system utilized Preoxygenation: Pre-oxygenation with 100% oxygen Intubation Type: IV induction Ventilation: Mask ventilation without difficulty and Oral airway inserted - appropriate to patient size Laryngoscope Size: Miller and 3 Grade View: Grade I Tube type: Oral Tube size: 8.0 mm Number of attempts: 1 Airway Equipment and Method: Stylet Placement Confirmation: ETT inserted through vocal cords under direct vision,  positive ETCO2,  CO2 detector and breath sounds checked- equal and bilateral Secured at: 22 cm Tube secured with: Tape

## 2013-06-21 NOTE — Transfer of Care (Signed)
Immediate Anesthesia Transfer of Care Note  Patient: Keith Bates  Procedure(s) Performed: Procedure(s) (LRB): ROBOTIC ASSISTED LAPAROSCOPIC RADICAL PROSTATECTOMY (N/A) LYMPHADENECTOMY  " BILATERAL PELVIC LYMPH NODE DISSECTION  (Bilateral)  Patient Location: PACU  Anesthesia Type: General  Level of Consciousness: sedated, patient cooperative and responds to stimulation  Airway & Oxygen Therapy: Patient Spontanous Breathing and Patient connected to face mask oxgen  Post-op Assessment: Report given to PACU RN and Post -op Vital signs reviewed and stable  Post vital signs: Reviewed and stable  Complications: No apparent anesthesia complications

## 2013-06-21 NOTE — Anesthesia Preprocedure Evaluation (Addendum)
Anesthesia Evaluation  Patient identified by MRN, date of birth, ID band Patient awake    Reviewed: Allergy & Precautions, H&P , NPO status , Patient's Chart, lab work & pertinent test results  Airway Mallampati: II TM Distance: >3 FB Neck ROM: Full    Dental no notable dental hx. (+) Partial Upper   Pulmonary neg pulmonary ROS, former smoker,  breath sounds clear to auscultation  Pulmonary exam normal       Cardiovascular negative cardio ROS  Rhythm:Regular Rate:Normal     Neuro/Psych negative neurological ROS  negative psych ROS   GI/Hepatic negative GI ROS, Neg liver ROS,   Endo/Other  negative endocrine ROS  Renal/GU negative Renal ROS  negative genitourinary   Musculoskeletal negative musculoskeletal ROS (+)   Abdominal   Peds negative pediatric ROS (+)  Hematology negative hematology ROS (+)   Anesthesia Other Findings   Reproductive/Obstetrics negative OB ROS                          Anesthesia Physical Anesthesia Plan  ASA: II  Anesthesia Plan: General   Post-op Pain Management:    Induction: Intravenous  Airway Management Planned: Oral ETT  Additional Equipment:   Intra-op Plan:   Post-operative Plan: Extubation in OR  Informed Consent: I have reviewed the patients History and Physical, chart, labs and discussed the procedure including the risks, benefits and alternatives for the proposed anesthesia with the patient or authorized representative who has indicated his/her understanding and acceptance.   Dental advisory given  Plan Discussed with: CRNA  Anesthesia Plan Comments:         Anesthesia Quick Evaluation

## 2013-06-21 NOTE — Anesthesia Postprocedure Evaluation (Signed)
  Anesthesia Post-op Note  Patient: Keith Bates  Procedure(s) Performed: Procedure(s) (LRB): ROBOTIC ASSISTED LAPAROSCOPIC RADICAL PROSTATECTOMY (N/A) LYMPHADENECTOMY  " BILATERAL PELVIC LYMPH NODE DISSECTION  (Bilateral)  Patient Location: PACU  Anesthesia Type: General  Level of Consciousness: awake and alert   Airway and Oxygen Therapy: Patient Spontanous Breathing  Post-op Pain: mild  Post-op Assessment: Post-op Vital signs reviewed, Patient's Cardiovascular Status Stable, Respiratory Function Stable, Patent Airway and No signs of Nausea or vomiting  Last Vitals:  Filed Vitals:   06/21/13 1715  BP: 156/94  Pulse: 82  Temp:   Resp: 19    Post-op Vital Signs: stable   Complications: No apparent anesthesia complications

## 2013-06-21 NOTE — Preoperative (Signed)
Beta Blockers   Reason not to administer Beta Blockers:Not Applicable 

## 2013-06-21 NOTE — H&P (Signed)
Keith Bates is an 72 y.o. male.    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 TRUS volume 193gm with large median lobe. IPSS 22 and not sexually active. CT with possible spine lesion proven degenerative on MRI. Bone scan negative.  PMH unremarkable. No CV disease. No strong blood thinners.   Today [name] is seen to proceed with robotic prostatectomy. He visited the [facility] multidisciplinary prostate clinic and has chosen to proceed with surgery.   Past Medical History  Diagnosis Date  . Enlarged prostate   . Prostate cancer   . Unspecified hemorrhoids without mention of complication   . Loss of weight   . GERD (gastroesophageal reflux disease)   . Environmental allergies   . Arthritis   . Hx of ulcer disease   . Nocturia   . DDD (degenerative disc disease)     Past Surgical History  Procedure Laterality Date  . Hemorrhoid banding    . Colonoscopy  03/13/2013    Family History  Problem Relation Age of Onset  . Prostate cancer Father     with mets to nodes  . Cancer Brother     colon and prostate  . Heart attack Mother    Social History:  reports that he quit smoking about 41 years ago. His smoking use included Cigarettes. He has a 15 pack-year smoking history. He has never used smokeless tobacco. He reports that he does not drink alcohol or use illicit drugs.  Allergies:  Allergies  Allergen Reactions  . Penicillins Rash    No prescriptions prior to admission    No results found for this or any previous visit (from the past 48 hour(s)). No results found.  Review of Systems  Constitutional: Negative.  Negative for fever and chills.  HENT: Negative.   Eyes: Negative.   Respiratory: Negative.   Cardiovascular: Negative.   Gastrointestinal: Negative.   Genitourinary: Positive for frequency and hematuria. Negative for flank  pain.  Musculoskeletal: Negative.   Skin: Negative.   Neurological: Negative.   Endo/Heme/Allergies: Negative.   Psychiatric/Behavioral: Negative.     There were no vitals taken for this visit. Physical Exam  Constitutional: He is oriented to person, place, and time. He appears well-developed and well-nourished.  HENT:  Head: Normocephalic and atraumatic.  Eyes: Pupils are equal, round, and reactive to light.  Neck: Normal range of motion. Neck supple.  Cardiovascular: Normal rate and regular rhythm.   Respiratory: Effort normal.  GI: Soft.  Genitourinary: Penis normal.  Musculoskeletal: Normal range of motion.  Neurological: He is alert and oriented to person, place, and time.  Skin: Skin is warm and dry.  Psychiatric: He has a normal mood and affect. His behavior is normal. Judgment and thought content normal.     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 I showed the patient on their abdomen the approximately 6 small incision (trocar) sites as well as presumed extraction sites with robotic approach as well as possible open incision sites should open conversion be necessary. We rediscussed peri-operative risks including bleeding, infection, deep vein thrombosis, pulmonary embolism, compartment syndrome, nuropathy / neuropraxia, heart attack, stroke, death, as well as long-term risks  such as non-cure / need for additional therapy. We specifically readdressed that the procedure would compromise urinary control leading to stress incontinence which typically resolves with time and pelvic rehabilitation (Kegel's, etc..), but can sometimes be permanent and require additional therapy including surgery. We also specifically readdressed sexual sequellae including significant erectile dysfunction which  typically partially resolves with time but can also be permanent and require additional therapy including surgery.   We rediscussed the typical hospital course including usual 1-2 night hospitalization, discharge with foley catheter in place usually for 1-2 weeks before voiding trial as well as usually 2 week recovery until able to perform most non-strenuous activity and 6 weeks until able to return to most jobs and more strenuous activity such as exercise. I specifically sated that he would be at substantially higher risk of all complications and longer local healing given large prostate size.  Pt voiced understanding and will proceed today as planned.  [name] 06/21/2013, 5:49 AM

## 2013-06-21 NOTE — Brief Op Note (Signed)
06/21/2013  4:57 PM  PATIENT:  Keith Bates  72 y.o. male  PRE-OPERATIVE DIAGNOSIS:  PROSTATE CANCER  POST-OPERATIVE DIAGNOSIS:  PROSTATE CANCER  PROCEDURE:  Procedure(s): ROBOTIC ASSISTED LAPAROSCOPIC RADICAL PROSTATECTOMY (N/A) LYMPHADENECTOMY  " BILATERAL PELVIC LYMPH NODE DISSECTION  (Bilateral)  SURGEON:  Surgeon(s) and Role:    * Sebastian Ache, MD - Primary    * Milford Cage, MD - Assisting  PHYSICIAN ASSISTANT:   ASSISTANTS: Lujean Rave PA   ANESTHESIA:   local and general  EBL:  Total I/O In: 2000 [I.V.:2000] Out: 800 [Urine:750; Blood:50]  BLOOD ADMINISTERED:none  DRAINS: JP to bulb suciton, Foley to straight drain   LOCAL MEDICATIONS USED:  MARCAINE     SPECIMEN:  Source of Specimen:  1 - Rt and Lt ext iliac nodes, 2- Rt and Lt obsturator nodes, 3 - radical prostatectomy, 4- periprostatic fat  DISPOSITION OF SPECIMEN:  PATHOLOGY  COUNTS:  YES  TOURNIQUET:  * No tourniquets in log *  DICTATION: .Other Dictation: Dictation Number [id number]  PLAN OF CARE: Admit to inpatient   PATIENT DISPOSITION:  PACU - hemodynamically stable.   Delay start of Pharmacological VTE agent (>24hrs) due to surgical blood loss or risk of bleeding: yes

## 2013-06-22 LAB — BASIC METABOLIC PANEL
Calcium: 9.2 mg/dL (ref 8.4–10.5)
GFR calc Af Amer: 42 mL/min — ABNORMAL LOW (ref >90)
GFR calc non Af Amer: 36 mL/min — ABNORMAL LOW (ref >90)
Glucose, Bld: 120 mg/dL — ABNORMAL HIGH (ref 70–99)
Potassium: 4.8 mEq/L (ref 3.5–5.1)
Sodium: 133 mEq/L — ABNORMAL LOW (ref 135–145)

## 2013-06-22 LAB — HEMOGLOBIN AND HEMATOCRIT, BLOOD
HCT: 39.5 % (ref 39.0–52.0)
Hemoglobin: 13.4 g/dL (ref 13.0–17.0)

## 2013-06-22 LAB — CREATININE, FLUID (PLEURAL, PERITONEAL, JP DRAINAGE): Creat, Fluid: 6.8 mg/dL

## 2013-06-22 NOTE — Op Note (Deleted)
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  PROCEDURES: 1. Robotic-assisted laparoscopic prostatectomy. 2. Bilateral pelvic lymphadenectomy.  CO-SURGEON:  Natalia Leatherwood, MD.  ASSISTANT:  Pecola Leisure, PA.  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  ESTIMATED BLOOD LOSS:  Less than 100 mL.  ASSESSMENT: 1. Right obturator lymph nodes. 2. Right external iliac lymph nodes. 3. Left obturator lymph nodes. 4. Left external iliac lymph nodes. 5. Radical prostatectomy. 6. Periprostatic fat.  All for permanent pathology.  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  He has undergone staging imaging including CT and bone scan, which revealed no distant disease.  He notably has a very large prostate that is approximately 200 g with a very large median lobe and he does have some significant baseline obstructive symptoms.  Options were discussed with the patient in detail.  He is also evaluated at the [facility] multidisciplinary prostate cancer conference and he was wished to proceed with radical prostatectomy.  Informed consent was obtained and placed in medical record.  PROCEDURE IN DETAIL:  The patient being, Keith Bates, verified. Procedure being radial prostatectomy was confirmed, procedure was carried out.  Time-out was performed.  Intravenous antibiotics were administered.  General endotracheal anesthesia was introduced.  The patient placed into a  low-lithotomy position.  His penis, perineum, proximal thighs were prepped with iodine, and then xiphoid, abdomen was prepped with chlorhexidine gluconate.  He was further fashioned on the operating table using 3-inch tape over his chest that was padded. Testis tissue in position was performed.  He was found to be adequately positioned.  Foley catheter was placed through a straight drain.  Next, a high-flow low-pressure pneumoperitoneum was obtained using Veress technique in the supraumbilical midline having passed the aspiration and drop test.  A 12-mm robotic camera port was placed in this location and laparoscopic examination of peritoneal cavity revealed no significant adhesions and no visceral injury.  Additional ports were placed as follows, right far lateral 12 mm assist port, right paramedian 8 mm robotic port, left paramedian 10 mm robotic port, left far lateral 8 mm robotic port, right paramedian 5 mm cyst port.  Robot was docked and passed through electronic checks.  Initial attention was directed at development of retroperitoneum.  Incision was made lateral to the left medial umbilical ligament from the midline towards the area of the internal ring and following the course of the iliac vessels.  The bladder was swept away from the sidewall.  Mirror-image dissection was performed on the right side.  Anterior attachments were then taken down to the area at the base of the prostate.  The endopelvic fascia was then identified and subway and then  the lateral border of the prostate and base to apex orientation.  This exposed the dorsal venous complex, which was controlled with endovascular load stapler.  Next, the bladder neck was identified in the Foley catheter back and forth.  Dissection proceeded at this level in the anterior-posterior direction, thus separating the bladder neck from the base of the prostate.  The mid to posterior aspect of this, the massive median lobe was  encountered and carefully delivered through the incision.  A figure-of-eight Vicryl suture was applied to this to allow manipulation, this placed in superior traction.  Ureteral orifices were identified at this point, and posterior dissection was performed, thus separating the bladder neck completely from the base of the prostate, taking great care to ensure that no direct injury occurred. Dissection proceeded in this plane inferiorly and the bilateral seminal vesicles were encountered, dissected for distance of 4 cm placed on gentle superior lateral traction and the bilateral seminal vesicles. Also, encountered dissection to the tips. The space of Denonviller  was then further developed from the base to apex of the prostate.  This sweeping the perirectal fat posteriorly.  This exposed the prostatic pedicles bilaterally first on the right side using sequential clipping technique.  The right prostatic pedicle was taken out in a base to apex orientation and mirror-image dissection was performed on the left side. Next, apical dissection was performed by keeping the prostate in gentle superior traction, identifying the membranous urethra and transecting leaving what appeared to be an adequate membranous urethral stump.  This completely freed up the radical prostatectomy specimen.  This specimen appeared to be much too large for placement in a standard retreival bag and was therefore set aside at this time.  Attention was directed pelvic lymphadenectomy first in the right side.  All fibrofatty tissue in the confines of the external iliac vein, artery, and genitofemoral nerve carefully dissected.  Hemostasis was achieved with cold clips.  This set aside labeled right external iliac lymph nodes.  All fibrofatty tissue in the confines of the right internal iliac artery, obturator nerve, and pelvic side wall were then dissected, labeled right external iliac lymph nodes and internal iliac lymph  nodes and set aside for permanent pathology.  A mirror-image lymphadenectomy performed on the left side, dissecting again the left external iliac as well as the left obturator and internal iliac lymph nodes.  All set aside for permanent pathology separately and lymphostasis achieved with cold clips.  Following these maneuvers, the obturator nerve were visualized and found to be uninjured.  Next, bladder neck reconstruction was performed using figure-of- eight 2-0 Vicryl at the 3 o'clock and 9 o'clock position of the bladder. Thus, making the caliber more suitable for anastomosis,this apparently also folded ureteral orifices away from the area of anastomosis further. Next, posterior reconstruction was performed by using a single V-Loc suture between the posterior membranous urethral plate and posterior bladder neck fascia reapproximating and tension-free anastomosis.  The mucosa anastomosis was then performed with double-armed V-Loc suture in a posterior and anterior direction, resulted in excellent mucosal apposition.  Final Foley catheter was placed, inflated, irrigated, and found to be no evidence of gross leak.  Notably just prior to the anastomosis, digital rectal exam was performed and there was no evidence of rectal perforation.  A closed suction drain was brought through the left lateral most robotic port site to the area of the pelvis.  The right 12 mm port site was closed with fascia using Leverne Humbles suture passer.  After this, port site was used to deliver the extra large retrieval bag towards the large prostate.  Specimen was retrieved by extending camera port site for a distance of approximately 6 cm and fascia was reapproximated using absorbable suture as a Scarpa's in skin.  All incision sites were infiltrated with dilute Marcaine and the procedure was then terminated.  The patient tolerated the procedure well.  There were no immediate periprocedural complications.  The  patient was taken to postanesthesia care unit in stable condition.          ______________________________ Sebastian Ache, MD     TM/MEDQ  D:  06/21/2013  T:  06/22/2013  Job:  [id number]

## 2013-06-22 NOTE — Progress Notes (Signed)
1 Day Post-Op Subjective: Patient reports minimal abdominal pain. There has been significant drainage around his JP site.  Objective: Vital signs in last 24 hours: Temp:  [97.4 F (36.3 C)-98.4 F (36.9 C)] 97.9 F (36.6 C) (11/15 0246) Pulse Rate:  [71-88] 71 (11/15 0246) Resp:  [9-20] 18 (11/15 0246) BP: (133-163)/(65-94) 135/65 mmHg (11/15 0246) SpO2:  [96 %-100 %] 99 % (11/15 0246) Weight:  [72.8 kg (160 lb 7.9 oz)] 72.8 kg (160 lb 7.9 oz) (11/14 1757)  Intake/Output from previous day: 11/14 0701 - 11/15 0700 In: 3745.4 [P.O.:60; I.V.:2685.4; IV Piggyback:1000] Out: 1248 [Urine:1150; Drains:48; Blood:50] Intake/Output this shift: Total I/O In: 500 [I.V.:500] Out: 390 [Urine:350; Drains:40]  Physical Exam:  Constitutional: Vital signs reviewed. WD WN in NAD   Eyes: PERRL, No scleral icterus.   Cardiovascular: RRR Pulmonary/Chest: Normal effort Abdominal: Soft. Non-tender, non-distended, bowel sounds are normal, no masses, organomegaly, or guarding present. Wounds are clean, dry and intact. JP wound site is draining serosanguineous fluid. Genitourinary: No edema. Extremities: No cyanosis or edema   Lab Results:  Recent Labs  06/21/13 1700 06/21/13 1910  HGB 14.5 14.3  HCT 42.8 42.3   BMET  Recent Labs  06/21/13 1910  CREATININE 1.02   No results found for this basename: LABPT, INR,  in the last 72 hours No results found for this basename: LABURIN,  in the last 72 hours No results found for this or any previous visit.  Studies/Results: No results found.  Assessment/Plan:   Postoperative day #1 robotic assisted laparoscopic radical prostatectomy. He seems to be doing well. There is a fair amount of drainage from his JP site. This may well be urine. I stripped the drain tubing. This decreased some of the fluid drainage around the drain tubing. Otherwise, he is doing well.    I will advance his diet. He will ambulate. Drain fluid sent for creatinine.   LOS: 1 day   Marcine Matar M 06/22/2013, 6:01 AM

## 2013-06-22 NOTE — Progress Notes (Signed)
And 0200 and 0600 pt's JP drain was fully charged with no output noted. Pt did have excessive draining around tubing from JP drain and dressing had to be changed x2 as well as linens changed at these times. Will continue to monitor

## 2013-06-22 NOTE — Progress Notes (Signed)
On assessment, JP site was draining excessively from around site. Not much coming out into bulb. Monitored and changed dressing until MD rounded. MD made aware of drainage and he assessed pt. After MD stripped JP drain, more output was noted in the bulb and site continued to leak at same rate. JP bulb started to fill rapidly and required very frequent emptying. MD made aware. To decreased the amount of emptying and with the approval of MD, the bulb was removed and placed inside an ostomy bag that was attached around the JP site. Hooked ostomy bag up to standard drainage bag. Excessive amount of drainage still continues. Will continue to monitor. Will notify MD if any further changes occur. Julio Sicks RN

## 2013-06-23 LAB — BASIC METABOLIC PANEL
BUN: 29 mg/dL — ABNORMAL HIGH (ref 6–23)
CO2: 21 mEq/L (ref 19–32)
Chloride: 98 mEq/L (ref 96–112)
Creatinine, Ser: 2.02 mg/dL — ABNORMAL HIGH (ref 0.50–1.35)
Glucose, Bld: 162 mg/dL — ABNORMAL HIGH (ref 70–99)
Potassium: 4.8 mEq/L (ref 3.5–5.1)
Sodium: 131 mEq/L — ABNORMAL LOW (ref 135–145)

## 2013-06-23 LAB — CBC WITH DIFFERENTIAL/PLATELET
Eosinophils Absolute: 0 10*3/uL (ref 0.0–0.7)
Eosinophils Relative: 0 % (ref 0–5)
HCT: 46.8 % (ref 39.0–52.0)
Lymphocytes Relative: 4 % — ABNORMAL LOW (ref 12–46)
Lymphs Abs: 0.7 10*3/uL (ref 0.7–4.0)
MCV: 91.9 fL (ref 78.0–100.0)
Monocytes Absolute: 1.2 10*3/uL — ABNORMAL HIGH (ref 0.1–1.0)
Monocytes Relative: 7 % (ref 3–12)
Neutrophils Relative %: 89 % — ABNORMAL HIGH (ref 43–77)
Platelets: 220 10*3/uL (ref 150–400)
RBC: 5.09 MIL/uL (ref 4.22–5.81)
WBC: 16.7 10*3/uL — ABNORMAL HIGH (ref 4.0–10.5)

## 2013-06-23 MED ORDER — ONDANSETRON HCL 4 MG/2ML IJ SOLN
4.0000 mg | INTRAMUSCULAR | Status: DC | PRN
  Administered 2013-06-23 – 2013-06-27 (×13): 4 mg via INTRAVENOUS
  Filled 2013-06-23 (×13): qty 2

## 2013-06-23 MED ORDER — BISACODYL 10 MG RE SUPP
10.0000 mg | Freq: Once | RECTAL | Status: AC
  Administered 2013-06-23: 10 mg via RECTAL
  Filled 2013-06-23: qty 1

## 2013-06-23 NOTE — Progress Notes (Signed)
2 Days Post-Op Subjective: Patient reports some nausea. He had a bit of emesis this morning. He is not complaining about significant abdominal pain. He has not had significant flatus or bowel movement yet.  Objective: Vital signs in last 24 hours: Temp:  [98.3 F (36.8 C)-101.3 F (38.5 C)] 98.6 F (37 C) (11/16 0529) Pulse Rate:  [68-97] 89 (11/16 0529) Resp:  [16-18] 18 (11/16 0529) BP: (123-132)/(68-81) 132/81 mmHg (11/16 0529) SpO2:  [97 %-98 %] 97 % (11/16 0529)  Intake/Output from previous day: 11/15 0701 - 11/16 0700 In: 2096.7 [P.O.:1680; I.V.:416.7] Out: 5150 [Urine:850; Drains:4300] Intake/Output this shift:    Physical Exam:  Constitutional: Vital signs reviewed. WD WN in NAD   Eyes: PERRL, No scleral icterus.   Cardiovascular: RRR Pulmonary/Chest: Normal effort Abdominal: Slightly distended. Appropriately tender. Incision sites are all healing well about evidence of infection. Bowel sounds present but diminished.  Genitourinary: Slight penile and scrotal edema Extremities: No cyanosis or edema   Lab Results:  Recent Labs  06/21/13 1700 06/21/13 1910 06/22/13 0453  HGB 14.5 14.3 13.4  HCT 42.8 42.3 39.5   BMET  Recent Labs  06/21/13 1910 06/22/13 0453  NA  --  133*  K  --  4.8  CL  --  100  CO2  --  24  GLUCOSE  --  120*  BUN  --  18  CREATININE 1.02 1.79*  CALCIUM  --  9.2   No results found for this basename: LABPT, INR,  in the last 72 hours No results found for this basename: LABURIN,  in the last 72 hours No results found for this or any previous visit.  Studies/Results: No results found.  Assessment/Plan:   Postoperative day #2 robotic assisted laparoscopic radical prostatectomy. He has a significant urinary leak. He has a mild ileus.    CBC is pending. I have taken his Harrison Mons drain off of suction. This morning, I   inflated his catheter balloon to 30 cc and put it on gentle traction. If this does not significantly decrease his  drainage, I may advance the drain slightly. I will also consider CT cystogram to assess for extent of anastomotic leak.   LOS: 2 days   Marcine Matar M 06/23/2013, 8:03 AM

## 2013-06-23 NOTE — Progress Notes (Signed)
Pt bowel sounds still hypoactive. Continues to have nausea. Vomited after consuming bowl of soup for lunch. Pt has been encouraged to ambulate in hall every 1-2 hours. Has been ambulating and tolerating it well.

## 2013-06-23 NOTE — Progress Notes (Signed)
INITIAL NUTRITION ASSESSMENT  DOCUMENTATION CODES Per approved criteria  -Not Applicable   INTERVENTION: - Patient encouraged to order easy to digest foods and achieve adequate intake.  - RD will monitor intake and add supplements if necessary  NUTRITION DIAGNOSIS: Inadequate oral intake related to emesis as evidenced by 10% intake of breakfast.   Goal: Patient will meet >/=90% of estimated nutrition needs  Monitor:  PO intake, weight, labs  Reason for Assessment: Malnutrition screening tool  72 y.o. male  Admitting Dx: Radical prostatectomy  ASSESSMENT: Patient with history of high risk prostate cancer, admitted for radial prostatectomy. He reports that he was eating well prior to admission. He lost about 10-12 pounds over the summer, but this is due to an increase in physical activity rather than decreased intake. He did have some emesis this morning, but was able to keep down a snack. No flatus or bowel movement yet.   Height: Ht Readings from Last 1 Encounters:  06/21/13 5\' 7"  (1.702 m)    Weight: Wt Readings from Last 1 Encounters:  06/21/13 160 lb 7.9 oz (72.8 kg)    Ideal Body Weight: 148 pounds  % Ideal Body Weight: 108%  Wt Readings from Last 10 Encounters:  06/21/13 160 lb 7.9 oz (72.8 kg)  06/21/13 160 lb 7.9 oz (72.8 kg)  06/17/13 151 lb (68.493 kg)  06/06/13 151 lb (68.493 kg)  05/17/13 151 lb 3.2 oz (68.584 kg)  04/09/13 157 lb (71.215 kg)  03/07/13 147 lb (66.679 kg)    Usual Body Weight: Unknown  % Usual Body Weight:   BMI:  Body mass index is 25.13 kg/(m^2). Patient is overweight.   Estimated Nutritional Needs: Kcal: 1850-2000 kcal Protein: 85-100 g Fluid: >2.2 L/day  Skin: 6 incisions abdomen with closed system drain  Diet Order: General  EDUCATION NEEDS: -No education needs identified at this time   Intake/Output Summary (Last 24 hours) at 06/23/13 1443 Last data filed at 06/23/13 0940  Gross per 24 hour  Intake    300 ml   Output   4675 ml  Net  -4375 ml    Last BM: PTA   Labs:   Recent Labs Lab 06/17/13 0950 06/21/13 1910 06/22/13 0453 06/23/13 0752  NA 138  --  133* 131*  K 4.8  --  4.8 4.8  CL 101  --  100 98  CO2 30  --  24 21  BUN 14  --  18 29*  CREATININE 0.92 1.02 1.79* 2.02*  CALCIUM 10.5  --  9.2 9.7  GLUCOSE 108*  --  120* 162*    CBG (last 3)  No results found for this basename: GLUCAP,  in the last 72 hours  Scheduled Meds: . heparin subcutaneous  5,000 Units Subcutaneous Q8H  . pantoprazole  40 mg Oral Daily    Continuous Infusions:   Past Medical History  Diagnosis Date  . Enlarged prostate   . Prostate cancer   . Unspecified hemorrhoids without mention of complication   . Loss of weight   . GERD (gastroesophageal reflux disease)   . Environmental allergies   . Arthritis   . Hx of ulcer disease   . Nocturia   . DDD (degenerative disc disease)     Past Surgical History  Procedure Laterality Date  . Hemorrhoid banding    . Colonoscopy  03/13/2013    Linnell Fulling, RD, LDN Pager #: [phone] After-Hours Pager #: [phone]

## 2013-06-24 ENCOUNTER — Encounter (HOSPITAL_COMMUNITY): Payer: Self-pay | Admitting: Urology

## 2013-06-24 LAB — CBC
HCT: 43.7 % (ref 39.0–52.0)
Hemoglobin: 15 g/dL (ref 13.0–17.0)
MCH: 31.8 pg (ref 26.0–34.0)
MCHC: 34.3 g/dL (ref 30.0–36.0)

## 2013-06-24 LAB — BASIC METABOLIC PANEL
BUN: 27 mg/dL — ABNORMAL HIGH (ref 6–23)
Chloride: 97 mEq/L (ref 96–112)
Creatinine, Ser: 1.64 mg/dL — ABNORMAL HIGH (ref 0.50–1.35)
GFR calc non Af Amer: 40 mL/min — ABNORMAL LOW (ref >90)
Glucose, Bld: 131 mg/dL — ABNORMAL HIGH (ref 70–99)
Potassium: 4.5 mEq/L (ref 3.5–5.1)

## 2013-06-24 MED ORDER — MAGNESIUM HYDROXIDE 400 MG/5ML PO SUSP
30.0000 mL | Freq: Once | ORAL | Status: AC
  Administered 2013-06-24: 20:00:00 30 mL via ORAL
  Filled 2013-06-24: qty 30

## 2013-06-24 MED ORDER — BISACODYL 10 MG RE SUPP
10.0000 mg | Freq: Every day | RECTAL | Status: DC | PRN
  Administered 2013-06-24: 14:00:00 10 mg via RECTAL
  Filled 2013-06-24 (×3): qty 1

## 2013-06-24 NOTE — Progress Notes (Signed)
3 Days Post-Op  Subjective:  1 - High Risk Prostate Cancer in Very Large Prostate - s/p robotic radical prostatectomy with bilateral pelvic lymphadenectomy 06/21/2013 for Gleason 4+3=7 adenocarcinoma and PSA 66. TRUS volume 193gm with large median lobe. Path pending.  2 - Urine Leak - Pt with very high JP output and JP Cr 6.8 c/w urine leak. Initially more urine via drain than foley, but transitioning away now.   Today [place] is making progress. Pain controlled, ambulatory, maintaining PO. Had 1 episode of emesis after eating "heavy food too fast". BM x 2 past 24 hours.   Objective: Vital signs in last 24 hours: Temp:  [98.1 F (36.7 C)-100 F (37.8 C)] 100 F (37.8 C) (11/17 1514) Pulse Rate:  [85-96] 96 (11/17 1514) Resp:  [16-28] 20 (11/17 1514) BP: (127-154)/(78-83) 154/83 mmHg (11/17 1514) SpO2:  [96 %-99 %] 99 % (11/17 1514) Last BM Date: 06/24/13  Intake/Output from previous day: 11/16 0701 - 11/17 0700 In: 420 [P.O.:420] Out: 975 [Urine:275; Drains:700] Intake/Output this shift: Total I/O In: 360 [P.O.:360] Out: -   General appearance: alert, cooperative and appears stated age Head: Normocephalic, without obvious abnormality, atraumatic Eyes: conjunctivae/corneas clear. PERRL, EOM's intact. Fundi benign. Ears: normal TM's and external ear canals both ears Nose: Nares normal. Septum midline. Mucosa normal. No drainage or sinus tenderness. Throat: lips, mucosa, and tongue normal; teeth and gums normal Neck: no adenopathy, no carotid bruit, no JVD, supple, symmetrical, trachea midline and thyroid not enlarged, symmetric, no tenderness/mass/nodules Back: symmetric, no curvature. ROM normal. No CVA tenderness. Resp: clear to auscultation bilaterally Chest wall: no tenderness Cardio: regular rate and rhythm, S1, S2 normal, no murmur, click, rub or gallop GI: soft, non-tender; bowel sounds normal; no masses,  no organomegaly Male genitalia: normal, foley in place with clear  urine. Extremities: extremities normal, atraumatic, no cyanosis or edema Pulses: 2+ and symmetric Skin: Skin color, texture, turgor normal. No rashes or lesions Lymph nodes: Cervical, supraclavicular, and axillary nodes normal. Neurologic: Grossly normal Incision/Wound: JP with copious serous output.   Lab Results:   Recent Labs  06/23/13 0752 06/24/13 0747  WBC 16.7* 12.1*  HGB 16.3 15.0  HCT 46.8 43.7  PLT 220 208   BMET  Recent Labs  06/23/13 0752 06/24/13 0747  NA 131* 133*  K 4.8 4.5  CL 98 97  CO2 21 28  GLUCOSE 162* 131*  BUN 29* 27*  CREATININE 2.02* 1.64*  CALCIUM 9.7 9.6   PT/INR No results found for this basename: LABPROT, INR,  in the last 72 hours ABG No results found for this basename: PHART, PCO2, PO2, HCO3,  in the last 72 hours  Studies/Results: No results found.  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 Path pending. Tolerating regular diet with resumed bowel function.   2 - Urine Leak - relative outputs improving,  Now more from foely than JP with light traction, continue. If trend continues, will DC home with current drains with pt to make chart to review at f/u.   [facility], Donnie Panik 06/24/2013

## 2013-06-24 NOTE — Clinical Documentation Improvement (Signed)
Pt with abnormal BUN/CR/GFR  Clarification Needed   Please clarify the clinical significance or underlying diagnosis responsible for the abnormal BUN/CR/GFR in setting of prostate cancer.    Possible Clinical Conditions?                                   Other Condition___________________                 Cannot Clinically Determine_________   Supporting Information: Risk Factors:  Prostate cancer Paralytic ileus Laparoscopic robotic assisted procedure Radical prostatectomy   Diagnostics  Component      BUN Creatinine  Latest Ref Rng      6 - 23 mg/dL 1.61 - 0.96 mg/dL    0.45 (H)  40/98/1191      29 (H) 2.02 (H)  06/24/2013      27 (H) 1.64 (H)   Component      GFR calc Af Amer  Latest Ref Rng      >90 mL/min  06/21/2013     7:10 PM 83 (L)  06/22/2013      42 (L)  06/23/2013      36 (L)  06/24/2013      47 (L)    Treatment:  Monitoring dextrose 5 %-0.45 % sodium chloride infusion     sodium chloride 0.9 % bolus 1,000 mL        Thank You, Enis Slipper RN, BSN, MSN/Inf, CCDS Clinical Documentation Specialist [facility] Dept Pager: [phone] / E-mail: [email or web address] [phone]  [place]- Health Information Management

## 2013-06-25 LAB — BASIC METABOLIC PANEL
BUN: 36 mg/dL — ABNORMAL HIGH (ref 6–23)
CO2: 30 mEq/L (ref 19–32)
Calcium: 9.8 mg/dL (ref 8.4–10.5)
Chloride: 93 mEq/L — ABNORMAL LOW (ref 96–112)
GFR calc Af Amer: 40 mL/min — ABNORMAL LOW (ref >90)
GFR calc non Af Amer: 34 mL/min — ABNORMAL LOW (ref >90)
Sodium: 134 mEq/L — ABNORMAL LOW (ref 135–145)

## 2013-06-25 LAB — URINE CULTURE: Culture: NO GROWTH

## 2013-06-25 MED ORDER — KCL IN DEXTROSE-NACL 20-5-0.45 MEQ/L-%-% IV SOLN
INTRAVENOUS | Status: DC
  Administered 2013-06-25 – 2013-06-26 (×2): via INTRAVENOUS
  Administered 2013-06-26: 1000 mL via INTRAVENOUS
  Administered 2013-06-27: 08:00:00 via INTRAVENOUS
  Administered 2013-06-27: 100 mL/h via INTRAVENOUS
  Administered 2013-06-28 – 2013-06-29 (×4): via INTRAVENOUS
  Filled 2013-06-25 (×13): qty 1000

## 2013-06-25 MED ORDER — BISACODYL 10 MG RE SUPP
10.0000 mg | Freq: Once | RECTAL | Status: AC
  Administered 2013-06-25: 10 mg via RECTAL
  Filled 2013-06-25: qty 1

## 2013-06-25 MED ORDER — VITAMINS A & D EX OINT
TOPICAL_OINTMENT | CUTANEOUS | Status: AC
  Administered 2013-06-25: 06:00:00
  Filled 2013-06-25: qty 5

## 2013-06-25 NOTE — Progress Notes (Signed)
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 TRUS volume 193gm with large median lobe. Path pending.  2 - Urine Leak - Pt with very high JP output and JP Cr 6.8 c/w urine leak. Initially more urine via drain than foley, but transitioning away now.   Today Keith Bates is having some nausea and not eating much. Ambulatory. Emesis x 2 past 24 hrs.  Objective: Vital signs in last 24 hours: Temp:  [98.4 F (36.9 C)-98.9 F (37.2 C)] 98.4 F (36.9 C) (11/18 1340) Pulse Rate:  [100-112] 112 (11/18 1340) Resp:  [16-20] 20 (11/18 1340) BP: (145-158)/(82-100) 145/82 mmHg (11/18 1413) SpO2:  [95 %-98 %] 95 % (11/18 1340) Last BM Date: 06/24/13  Intake/Output from previous day: 11/17 0701 - 11/18 0700 In: 360 [P.O.:360] Out: 1000 [Urine:575; Drains:425] Intake/Output this shift: Total I/O In: 600 [P.O.:600] Out: 350 [Urine:150; Drains:200]  General appearance: alert, cooperative and appears stated age Head: Normocephalic, without obvious abnormality, atraumatic Eyes: conjunctivae/corneas clear. PERRL, EOM's intact. Fundi benign. Ears: normal TM's and external ear canals both ears Nose: Nares normal. Septum midline. Mucosa normal. No drainage or sinus tenderness. Throat: lips, mucosa, and tongue normal; teeth and gums normal Neck: no adenopathy, no carotid bruit, no JVD, supple, symmetrical, trachea midline and thyroid not enlarged, symmetric, no tenderness/mass/nodules Back: symmetric, no curvature. ROM normal. No CVA tenderness. Resp: clear to auscultation bilaterally Chest wall: no tenderness Cardio: regular rate and rhythm, S1, S2 normal, no murmur, click, rub or gallop GI: soft, non-tender; bowel sounds normal; no masses,  no organomegaly and No distention. No tympany. Male genitalia: normal, foley c/d/i with clear  urine. Extremities: extremities normal, atraumatic, no cyanosis or edema Pulses: 2+ and symmetric Skin: Skin color, texture, turgor normal. No rashes or lesions Lymph nodes: Cervical, supraclavicular, and axillary nodes normal. Neurologic: Grossly normal Incision/Wound: Recent port sites and extraction sites c/d/i. JP site pouched with drain in place and stable serous / uriniferous output.  Lab Results:   Recent Labs  06/23/13 0752 06/24/13 0747  WBC 16.7* 12.1*  HGB 16.3 15.0  HCT 46.8 43.7  PLT 220 208   BMET  Recent Labs  06/24/13 0747 06/25/13 0434  NA 133* 134*  K 4.5 4.8  CL 97 93*  CO2 28 30  GLUCOSE 131* 157*  BUN 27* 36*  CREATININE 1.64* 1.88*  CALCIUM 9.6 9.8   PT/INR No results found for this basename: LABPROT, INR,  in the last 72 hours ABG No results found for this basename: PHART, PCO2, PO2, HCO3,  in the last 72 hours  Studies/Results: No results found.  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 Path pending. Now with some nausea / ileus picture likely from  bowel irritation from urine leak.   2 - Urine Leak - relative outputs improving, Now more from foely than JP with light traction, continue. If not sig improved, may consider CT cystogram. This likely explains his ileus as well as increased serum cr via reabsorption.   Resume IVF as not maintaining good PO hydration.  [facility], Maylea Soria 06/25/2013

## 2013-06-25 NOTE — Care Management Note (Signed)
    Page 1 of 1   06/25/2013     12:40:15 PM   CARE MANAGEMENT NOTE 06/25/2013  Patient:  Keith Bates, Keith Bates   Account Number:  [id number]  Date Initiated:  06/22/2013  Documentation initiated by:  Algernon Huxley  Subjective/Objective Assessment:   72 year old male admitted s/p ROBOTIC ASSISTED LAPAROSCOPIC RADICAL PROSTATECTOMY (N/A)  LYMPHADENECTOMY  " BILATERAL PELVIC LYMPH NODE DISSECTION (Bilateral     Action/Plan:   From home.   Anticipated DC Date:  06/27/2013   Anticipated DC Plan:  HOME/SELF CARE      DC Planning Services  CM consult      Choice offered to / List presented to:             Status of service:  In process, will continue to follow Medicare Important Message given?  NA - LOS <3 / Initial given by admissions (If response is "NO", the following Medicare IM given date fields will be blank) Date Medicare IM given:   Date Additional Medicare IM given:    Discharge Disposition:    Per UR Regulation:  Reviewed for med. necessity/level of care/duration of stay  If discussed at Long Length of Stay Meetings, dates discussed:    Comments:  06/25/13 Berkley Wrightsman RN,BSN NCM [phone] POD#3 LAP ASST RAD PROSTATECTOMY.URINE LEAK-UROSTOMY BAG HIGH OUTPUT,PATH PEND.NAUSEA/VOMITING.D/C PLAN HOME.

## 2013-06-26 ENCOUNTER — Inpatient Hospital Stay (HOSPITAL_COMMUNITY): Payer: Medicare Other

## 2013-06-26 ENCOUNTER — Encounter (HOSPITAL_COMMUNITY): Payer: Self-pay | Admitting: Radiology

## 2013-06-26 LAB — BASIC METABOLIC PANEL
BUN: 46 mg/dL — ABNORMAL HIGH (ref 6–23)
Creatinine, Ser: 1.57 mg/dL — ABNORMAL HIGH (ref 0.50–1.35)
GFR calc Af Amer: 49 mL/min — ABNORMAL LOW (ref >90)
GFR calc non Af Amer: 43 mL/min — ABNORMAL LOW (ref >90)
Glucose, Bld: 171 mg/dL — ABNORMAL HIGH (ref 70–99)
Potassium: 4.1 mEq/L (ref 3.5–5.1)
Sodium: 131 mEq/L — ABNORMAL LOW (ref 135–145)

## 2013-06-26 MED ORDER — METOPROLOL TARTRATE 25 MG PO TABS
25.0000 mg | ORAL_TABLET | Freq: Two times a day (BID) | ORAL | Status: DC
  Administered 2013-06-26: 25 mg via ORAL
  Filled 2013-06-26 (×3): qty 1

## 2013-06-26 MED ORDER — SODIUM CHLORIDE 0.9 % IV SOLN
25.0000 mg | Freq: Four times a day (QID) | INTRAVENOUS | Status: DC | PRN
  Administered 2013-06-26: 23:00:00 25 mg via INTRAVENOUS
  Filled 2013-06-26: qty 1

## 2013-06-26 MED ORDER — BISACODYL 10 MG RE SUPP
10.0000 mg | Freq: Once | RECTAL | Status: AC
  Administered 2013-06-26: 10 mg via RECTAL
  Filled 2013-06-26: qty 1

## 2013-06-26 MED ORDER — PROMETHAZINE HCL 25 MG/ML IJ SOLN
12.5000 mg | Freq: Four times a day (QID) | INTRAMUSCULAR | Status: DC | PRN
  Administered 2013-06-26: 21:00:00 12.5 mg via INTRAVENOUS
  Filled 2013-06-26: qty 1

## 2013-06-26 MED ORDER — METOCLOPRAMIDE HCL 5 MG/ML IJ SOLN
10.0000 mg | Freq: Three times a day (TID) | INTRAMUSCULAR | Status: DC
  Administered 2013-06-26 – 2013-07-09 (×40): 10 mg via INTRAVENOUS
  Filled 2013-06-26 (×49): qty 2

## 2013-06-26 MED ORDER — IOHEXOL 300 MG/ML  SOLN
25.0000 mL | INTRAMUSCULAR | Status: AC
  Administered 2013-06-26: 25 mL via ORAL

## 2013-06-26 NOTE — Progress Notes (Signed)
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 TRUS volume 193gm with large median lobe. Path T3bN0Mx with multifocal positive margins including bladder neck, seminal vesicals.  2 - Urine Leak - Pt with very high JP output and JP Cr 6.8 c/w urine leak. Initially more urine via drain than foley, but transitioning away now. CTcystogram 11/19 confirms small anterior anastomotic leak, JP in good position.  3 - Ileus - Pt with persistent nausea and occasional emesis post-op. CT cystogram 06/2013 confirms ileus picture, no obstruction / abscess / perforation.  Today Keith Bates continues to have some nausea. Ambulatory. Emesis x 2 past 24 hrs.  Objective: Vital signs in last 24 hours: Temp:  [97.8 F (36.6 C)-98.5 F (36.9 C)] 97.8 F (36.6 C) (11/19 1523) Pulse Rate:  [97-102] 97 (11/19 1523) Resp:  [19-20] 19 (11/19 1523) BP: (158-159)/(93-103) 159/103 mmHg (11/19 1523) SpO2:  [96 %-97 %] 97 % (11/19 1523) Last BM Date: 06/24/13  Intake/Output from previous day: 11/18 0701 - 11/19 0700 In: 1573.8 [P.O.:600; I.V.:973.8] Out: 1135 [Urine:750; Drains:385] Intake/Output this shift: Total I/O In: 437.5 [P.O.:120; I.V.:317.5] Out: 500 [Urine:400; Drains:100]  General appearance: alert, cooperative and appears stated age Head: Normocephalic, without obvious abnormality, atraumatic Eyes: conjunctivae/corneas clear. PERRL, EOM's intact. Fundi benign. Ears: normal TM's and external ear canals both ears Nose: Nares normal. Septum midline. Mucosa normal. No drainage or sinus tenderness. Throat: lips, mucosa, and tongue normal; teeth and gums normal Neck: no adenopathy, no carotid bruit, no JVD, supple, symmetrical, trachea midline and thyroid not enlarged, symmetric, no tenderness/mass/nodules Back: symmetric, no curvature. ROM normal.  No CVA tenderness. Resp: clear to auscultation bilaterally Chest wall: no tenderness Cardio: regular rate and rhythm, S1, S2 normal, no murmur, click, rub or gallop GI: BS hypoactie, but present, Abd non-tympanic. Male genitalia: normal Extremities: extremities normal, atraumatic, no cyanosis or edema Pulses: 2+ and symmetric Skin: Skin color, texture, turgor normal. No rashes or lesions Lymph nodes: Cervical, supraclavicular, and axillary nodes normal. Neurologic: Grossly normal Incision/Wound: Port sites and extraction sites c/d/i. Jp with uriniferous efflux. Foley c/d/i with clear urine.   Lab Results:   Recent Labs  06/24/13 0747  WBC 12.1*  HGB 15.0  HCT 43.7  PLT 208   BMET  Recent Labs  06/25/13 0434 06/26/13 0438  NA 134* 131*  K 4.8 4.1  CL 93* 86*  CO2 30 34*  GLUCOSE 157* 171*  BUN 36* 46*  CREATININE 1.88* 1.57*  CALCIUM 9.8 10.0   PT/INR No results found for this basename: LABPROT, INR,  in the last 72 hours ABG No results found for this basename: PHART, PCO2, PO2, HCO3,  in the last 72 hours  Studies/Results: Ct Abdomen Pelvis Wo Contrast  06/26/2013   CLINICAL DATA:  Prostate cancer.  EXAM: CT ABDOMEN AND PELVIS WITHOUT CONTRAST  TECHNIQUE: Multidetector CT imaging of the abdomen and pelvis was performed following the standard protocol without intravenous contrast.  COMPARISON:  None.  FINDINGS: The lung bases are clear. There is coronary artery atherosclerosis.  No renal, ureteral, or bladder calculi. No obstructive uropathy. No perinephric stranding is seen. The kidneys are symmetric in size without evidence for exophytic mass.  The liver demonstrates no focal abnormality. The gallbladder is unremarkable. The spleen demonstrates no focal abnormality. The adrenal glands and pancreas are normal.  There is a Foley catheter within the  bladder. Diluted Omnipaque was hand injected into the bladder. There is contrast extravasation from the anterior aspect of  the prostatic urethra and contrast is seen along the lower pelvis and along the left pericolic gutter. There is also contrast seen along the posterior bladder wall which approaches the serosal surface, but does not appear to extend beyond the bladder.  There is distention of the stomach. There is dilatation of the small bowel measuring up to 4.9 cm in diameter with multiple small bowel and stomach air-fluid levels. There is a moderate amount of stool in the ascending colon. There are a few small locular soft pneumoperitoneum likely postsurgical. There is a surgical drain present within the lower abdomen. There is no pneumoperitoneum, pneumatosis, or portal venous gas. There is no lymphadenopathy.  The abdominal aorta is normal in caliber with atherosclerosis.  There is lumbar spine spondylosis.  IMPRESSION: 1. There is contrast extravasation from the anterior aspect of the prostatic urethra and contrast is seen along the lower pelvis and along the left pericolic gutter.  2. Stomach and small bowel distension with multiple air-fluid levels likely reflecting a postoperative ileus.  3. Small amount a pneumoperitoneum likely postsurgical. There is a surgical drain also present.  4. There is also contrast seen along the posterior bladder wall which approaches the serosal surface, but does not appear to extend beyond the bladder.   Electronically Signed   By: Elige Ko   On: 06/26/2013 15:00    Anti-infectives: Anti-infectives   Start     Dose/Rate Route Frequency Ordered Stop   06/21/13 0930  clindamycin (CLEOCIN) IVPB 900 mg     900 mg 100 mL/hr over 30 Minutes Intravenous  Once 06/21/13 0921 06/21/13 1150   06/21/13 0921  ciprofloxacin (CIPRO) IVPB 400 mg  Status:  Discontinued     400 mg 200 mL/hr over 60 Minutes Intravenous Every 12 hours 06/21/13 0921 06/21/13 1753   06/21/13 0000  ciprofloxacin (CIPRO) 500 MG tablet     500 mg Oral 2 times daily 06/21/13 1628        Assessment/Plan:  1 - High  Risk Prostate Cancer in Very Large Prostate - s/p surgery. Path discussed with pt. Will likely need adjuvant therapy eventually.  2 - Urine Leak - relative outputs improving, Now more from foley than JP with light traction, continue. CT cystogram confirms likely anterior anastamotic leak.  3 - Ileus - Likelyl from peritoneal irritation from urine leak. No obstruction. Begin reglan, continue daily ducolax spp and PO bowel regiemn.   [facility], Keith Bates 06/26/2013

## 2013-06-26 NOTE — Progress Notes (Addendum)
Paged Dr. Ezzard Flax, received call back from Jetta Lout, NP.   Regarding worsening abdomen distention, persistent nausea, hiccups and pt is now clammy and diaphoretic.    B/P 158/93 HR 114 T 98.4 SPO2 95.  Received call back with orders of Phenergan 12.5 mg and Simethicone for Hiccups, MD did not want to proceed with any further orders since he was examined within the last two hours.   Renold Don NP back requested Thorazine 25mg  by IV for Hiccups / Nausea to avoid PO route of Simethicone.

## 2013-06-27 ENCOUNTER — Inpatient Hospital Stay (HOSPITAL_COMMUNITY): Payer: Medicare Other

## 2013-06-27 LAB — CBC
HCT: 47.9 % (ref 39.0–52.0)
Hemoglobin: 16.6 g/dL (ref 13.0–17.0)
MCH: 31.8 pg (ref 26.0–34.0)
MCHC: 34.7 g/dL (ref 30.0–36.0)
MCV: 91.8 fL (ref 78.0–100.0)
RBC: 5.22 MIL/uL (ref 4.22–5.81)
WBC: 14.1 10*3/uL — ABNORMAL HIGH (ref 4.0–10.5)

## 2013-06-27 LAB — BASIC METABOLIC PANEL
CO2: 29 mEq/L (ref 19–32)
Chloride: 88 mEq/L — ABNORMAL LOW (ref 96–112)
Creatinine, Ser: 1.61 mg/dL — ABNORMAL HIGH (ref 0.50–1.35)
GFR calc Af Amer: 48 mL/min — ABNORMAL LOW (ref >90)
GFR calc non Af Amer: 41 mL/min — ABNORMAL LOW (ref >90)
Potassium: 4.4 mEq/L (ref 3.5–5.1)
Sodium: 129 mEq/L — ABNORMAL LOW (ref 135–145)

## 2013-06-27 LAB — COMPREHENSIVE METABOLIC PANEL
ALT: 9 U/L (ref 0–53)
BUN: 56 mg/dL — ABNORMAL HIGH (ref 6–23)
CO2: 32 mEq/L (ref 19–32)
Calcium: 9.7 mg/dL (ref 8.4–10.5)
GFR calc Af Amer: 39 mL/min — ABNORMAL LOW (ref >90)
GFR calc non Af Amer: 34 mL/min — ABNORMAL LOW (ref >90)
Glucose, Bld: 243 mg/dL — ABNORMAL HIGH (ref 70–99)
Potassium: 4.1 mEq/L (ref 3.5–5.1)
Total Protein: 7.4 g/dL (ref 6.0–8.3)

## 2013-06-27 LAB — URINE MICROSCOPIC-ADD ON

## 2013-06-27 LAB — URINALYSIS, ROUTINE W REFLEX MICROSCOPIC
Glucose, UA: NEGATIVE mg/dL
Ketones, ur: NEGATIVE mg/dL
pH: 5.5 (ref 5.0–8.0)

## 2013-06-27 LAB — MRSA PCR SCREENING: MRSA by PCR: NEGATIVE

## 2013-06-27 LAB — LACTIC ACID, PLASMA
Lactic Acid, Venous: 3.7 mmol/L — ABNORMAL HIGH (ref 0.5–2.2)
Lactic Acid, Venous: 1.8 mmol/L (ref 0.5–2.2)

## 2013-06-27 LAB — PROTIME-INR: INR: 1.13 (ref 0.00–1.49)

## 2013-06-27 LAB — CORTISOL: Cortisol, Plasma: 85.7 ug/dL

## 2013-06-27 LAB — PROCALCITONIN: Procalcitonin: 1.97 ng/mL

## 2013-06-27 LAB — CK TOTAL AND CKMB (NOT AT ARMC): Total CK: 75 U/L (ref 7–232)

## 2013-06-27 MED ORDER — PANTOPRAZOLE SODIUM 40 MG IV SOLR
40.0000 mg | Freq: Two times a day (BID) | INTRAVENOUS | Status: DC
  Administered 2013-06-27 – 2013-07-04 (×15): 40 mg via INTRAVENOUS
  Filled 2013-06-27 (×17): qty 40

## 2013-06-27 MED ORDER — BOOST / RESOURCE BREEZE PO LIQD
1.0000 | Freq: Two times a day (BID) | ORAL | Status: DC
  Administered 2013-07-02 – 2013-07-05 (×7): 1 via ORAL

## 2013-06-27 MED ORDER — METOPROLOL TARTRATE 1 MG/ML IV SOLN
5.0000 mg | Freq: Four times a day (QID) | INTRAVENOUS | Status: DC
  Administered 2013-06-27 – 2013-07-09 (×47): 5 mg via INTRAVENOUS
  Filled 2013-06-27 (×58): qty 5

## 2013-06-27 MED ORDER — BISACODYL 10 MG RE SUPP
10.0000 mg | Freq: Once | RECTAL | Status: AC
  Administered 2013-06-27: 10 mg via RECTAL

## 2013-06-27 MED ORDER — ADULT MULTIVITAMIN LIQUID CH
5.0000 mL | Freq: Every day | ORAL | Status: DC
  Administered 2013-06-27: 5 mL via ORAL
  Filled 2013-06-27 (×3): qty 5

## 2013-06-27 MED ORDER — SODIUM CHLORIDE 0.9 % IV BOLUS (SEPSIS)
1000.0000 mL | Freq: Once | INTRAVENOUS | Status: AC
  Administered 2013-06-27: 1000 mL via INTRAVENOUS

## 2013-06-27 NOTE — Progress Notes (Signed)
Paged D. Myrtie Soman, NP with abnormal labs. Order given to transfer to ICU.

## 2013-06-27 NOTE — Progress Notes (Signed)
NUTRITION FOLLOW UP  Intervention:   Diet advancement per MD discretion Provide Resource Breeze BID until diet advanced Provide Liquid Multivitamin daily   Nutrition Dx:   Inadequate oral intake related to emesis as evidenced by 10% intake of breakfast; ongoing- varied PO intake 25-100%, clear liquids today  Goal:   Pt to meet >/= 90% of their estimated nutrition needs; not likely being met  Monitor:   Diet advancement PO intake; varied 25-100% Weight; 3 lb weight increase from 11/10 to 11/20 Labs; low sodium, high BUN, low GFR  Assessment:   Patient with history of high risk prostate cancer, admitted for radial prostatectomy. Pt had robotic radical prostatectomy with bilateral pelvic lymphadenectomy 06/21/2013.  Pt was on a regular diet 11/15 until today; pt now on a clear liquid diet due to ileus. Pt ate <50% of lunch today; pt reports his abdomen feels tight so, he can't eat much. Brought pt a Nurse, adult supplement at time of visit; pt willing to drink supplements daily until diet is advanced.     Height: Ht Readings from Last 1 Encounters:  06/27/13 5\' 7"  (1.702 m)    Weight Status:   Wt Readings from Last 1 Encounters:  06/27/13 154 lb 8.7 oz (70.1 kg)    Re-estimated needs:  Kcal: 1750-1950 kcal  Protein: 85-100 g  Fluid: 1.8-2 L/day  Skin: abdominal incisions  Diet Order: Clear Liquid   Intake/Output Summary (Last 24 hours) at 06/27/13 1423 Last data filed at 06/27/13 1400  Gross per 24 hour  Intake 3388.55 ml  Output   2375 ml  Net 1013.55 ml    Last BM: 11/19   Labs:   Recent Labs Lab 06/26/13 0438 06/27/13 0210 06/27/13 1300  NA 131* 130* 129*  K 4.1 4.1 4.4  CL 86* 83* 88*  CO2 34* 32 29  BUN 46* 56* 55*  CREATININE 1.57* 1.91* 1.61*  CALCIUM 10.0 9.7 9.2  GLUCOSE 171* 243* 194*    CBG (last 3)  No results found for this basename: GLUCAP,  in the last 72 hours  Scheduled Meds: . heparin subcutaneous  5,000 Units  Subcutaneous Q8H  . metoCLOPramide (REGLAN) injection  10 mg Intravenous Q8H  . metoprolol  5 mg Intravenous Q6H  . pantoprazole (PROTONIX) IV  40 mg Intravenous Q12H    Continuous Infusions: . dextrose 5 % and 0.45 % NaCl with KCl 20 mEq/L 100 mL/hr at 06/27/13 0825    Ian Malkin RD, LDN Inpatient Clinical Dietitian Pager: [phone] After Hours Pager: [phone]

## 2013-06-27 NOTE — Progress Notes (Signed)
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 TRUS volume 193gm with large median lobe. Path T3bN0Mx with multifocal positive margins including bladder neck, seminal vesicals.  2 - Urine Leak - Pt with very high JP output and JP Cr 6.8 c/w urine leak. Initially more urine via drain than foley, but transitioning away now. CTcystogram 11/19 confirms small anterior anastomotic leak, JP in good position.  3 - Ileus - Pt with persistent nausea and occasional emesis post-op. CT cystogram 06/2013 confirms ileus picture, no obstruction / abscess / perforation.   Today Keith Bates feels a little better in terms of abdomina distension and nausea. No emesis last night. He was moved to ICU for episode of confusion after promethazine that has since resolved. His PO intake remains poor.   Objective: Vital signs in last 24 hours: Temp:  [97.6 F (36.4 C)-98.4 F (36.9 C)] 97.6 F (36.4 C) (11/20 0111) Pulse Rate:  [97-133] 120 (11/20 0600) Resp:  [15-19] 16 (11/20 0600) BP: (135-169)/(81-117) 169/99 mmHg (11/20 0600) SpO2:  [95 %-100 %] 97 % (11/20 0600) Weight:  [70.1 kg (154 lb 8.7 oz)] 70.1 kg (154 lb 8.7 oz) (11/20 0400) Last BM Date: 06/26/13  Intake/Output from previous day: 11/19 0701 - 11/20 0700 In: 1939.8 [P.O.:240; I.V.:1674.8; IV Piggyback:25] Out: 1100 [Urine:600; Drains:500] Intake/Output this shift: Total I/O In: 1382.3 [I.V.:1357.3; IV Piggyback:25] Out: 600 [Urine:200; Drains:400]  General appearance: alert, cooperative and appears stated age Head: Normocephalic, without obvious abnormality, atraumatic Eyes: conjunctivae/corneas clear. PERRL, EOM's intact. Fundi benign. Ears: normal TM's and external ear canals both ears Nose: Nares normal. Septum midline. Mucosa normal. No drainage or sinus tenderness. Throat: lips,  mucosa, and tongue normal; teeth and gums normal Neck: no adenopathy, no carotid bruit, no JVD, supple, symmetrical, trachea midline and thyroid not enlarged, symmetric, no tenderness/mass/nodules Back: symmetric, no curvature. ROM normal. No CVA tenderness. Resp: clear to auscultation bilaterally Breasts: normal appearance, no masses or tenderness Cardio: regular rate and rhythm, S1, S2 normal, no murmur, click, rub or gallop and tachycardic, regular GI: moderate distension, stable. hypoactive bowel sounds. Male genitalia: normal, foley on light traction with clear urine in bag.  Extremities: extremities normal, atraumatic, no cyanosis or edema Pulses: 2+ and symmetric Skin: Skin color, texture, turgor normal. No rashes or lesions Lymph nodes: Cervical, supraclavicular, and axillary nodes normal. Neurologic: Alert and oriented X 3, normal strength and tone. Normal symmetric reflexes. Normal coordination and gait Incision/Wound: Recent port sites / incision sites c/d/i. JP with uiniferous fluid.   Lab Results:   Recent Labs  06/24/13 0747 06/27/13 0210  WBC 12.1* 14.1*  HGB 15.0 16.6  HCT 43.7 47.9  PLT 208 283   BMET  Recent Labs  06/26/13 0438 06/27/13 0210  NA 131* 130*  K 4.1 4.1  CL 86* 83*  CO2 34* 32  GLUCOSE 171* 243*  BUN 46* 56*  CREATININE 1.57* 1.91*  CALCIUM 10.0 9.7   PT/INR  Recent Labs  06/27/13 0210  LABPROT 14.3  INR 1.13   ABG No results found for this basename: PHART, PCO2, PO2, HCO3,  in the last 72 hours  Studies/Results: Ct Abdomen Pelvis Wo Contrast  06/26/2013   CLINICAL DATA:  Prostate cancer.  EXAM: CT ABDOMEN AND PELVIS WITHOUT CONTRAST  TECHNIQUE: Multidetector CT imaging of the abdomen and pelvis was performed following the standard protocol without  intravenous contrast.  COMPARISON:  None.  FINDINGS: The lung bases are clear. There is coronary artery atherosclerosis.  No renal, ureteral, or bladder calculi. No obstructive uropathy.  No perinephric stranding is seen. The kidneys are symmetric in size without evidence for exophytic mass.  The liver demonstrates no focal abnormality. The gallbladder is unremarkable. The spleen demonstrates no focal abnormality. The adrenal glands and pancreas are normal.  There is a Foley catheter within the bladder. Diluted Omnipaque was hand injected into the bladder. There is contrast extravasation from the anterior aspect of the prostatic urethra and contrast is seen along the lower pelvis and along the left pericolic gutter. There is also contrast seen along the posterior bladder wall which approaches the serosal surface, but does not appear to extend beyond the bladder.  There is distention of the stomach. There is dilatation of the small bowel measuring up to 4.9 cm in diameter with multiple small bowel and stomach air-fluid levels. There is a moderate amount of stool in the ascending colon. There are a few small locular soft pneumoperitoneum likely postsurgical. There is a surgical drain present within the lower abdomen. There is no pneumoperitoneum, pneumatosis, or portal venous gas. There is no lymphadenopathy.  The abdominal aorta is normal in caliber with atherosclerosis.  There is lumbar spine spondylosis.  IMPRESSION: 1. There is contrast extravasation from the anterior aspect of the prostatic urethra and contrast is seen along the lower pelvis and along the left pericolic gutter.  2. Stomach and small bowel distension with multiple air-fluid levels likely reflecting a postoperative ileus.  3. Small amount a pneumoperitoneum likely postsurgical. There is a surgical drain also present.  4. There is also contrast seen along the posterior bladder wall which approaches the serosal surface, but does not appear to extend beyond the bladder.   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 Path discussed with pt. Will likely need adjuvant therapy eventually.  2 - Urine Leak - relative outputs improving, Now more from foley than JP with light traction, continue. The importance of this was re-itterated to nursing staff. CT cystogram confirms likely anterior anastamotic leak.  3 - Ileus - Likelyl from peritoneal irritation from urine leak. No obstruction. Continue reglan, continue daily ducolax spp and PO bowel regiemn. Will consider NG if refractory emesis.  4 - Tachycardia, Slight Lactic Acidosis - multifactiorial from discomfort and likely relative dehydration. No significant infectious parameters. IVF bolus and increase base rate, begin B-blocker. Recheck BMP,Lactate in PM.  [facility], Keith Bates 06/27/2013

## 2013-06-27 NOTE — Progress Notes (Signed)
eLink Physician-Brief Progress Note Patient Name: Keith Bates DOB: May 27, 1941 MRN: [id number]  Date of Service  06/27/2013   HPI/Events of Note   Ileus causing pain & hypertension  eICU Interventions  D/w Dr Brunilda Payor - NG to LIS npo   Intervention Category Intermediate Interventions: Abdominal pain - evaluation and management  [name] V. 06/27/2013, 7:34 PM

## 2013-06-27 NOTE — Progress Notes (Signed)
Inpatient Diabetes Program Recommendations  AACE/ADA: New Consensus Statement on Inpatient Glycemic Control (2013)  Target Ranges:  Prepandial:   less than 140 mg/dL      Peak postprandial:   less than 180 mg/dL (1-2 hours)      Critically ill patients:  140 - 180 mg/dL   Reason for Visit: Hyperglycemia  Results for Keith Bates, Keith Bates (MRN [id number]) as of 06/27/2013 16:45  Ref. Range 06/25/2013 04:34 06/26/2013 04:38 06/27/2013 02:10 06/27/2013 13:00  Glucose Latest Range: 70-99 mg/dL 409 (H) 811 (H) 914 (H) 194 (H)  Results for Keith Bates, Keith Bates (MRN [id number]) as of 06/27/2013 16:45  Ref. Range 06/27/2013 02:10 06/27/2013 13:00  Sodium Latest Range: 135-145 mEq/L 130 (L) 129 (L)  Potassium Latest Range: 3.5-5.1 mEq/L 4.1 4.4  Chloride Latest Range: 96-112 mEq/L 83 (L) 88 (L)  CO2 Latest Range: 19-32 mEq/L 32 29  BUN Latest Range: 6-23 mg/dL 56 (H) 55 (H)  Creatinine Latest Range: 0.50-1.35 mg/dL 0.86 (H) 5.78 (H)  Calcium Latest Range: 8.4-10.5 mg/dL 9.7 9.2  GFR calc non Af Amer Latest Range: >90 mL/min 34 (L) 41 (L)  GFR calc Af Amer Latest Range: >90 mL/min 39 (L) 48 (L)  Glucose Latest Range: 70-99 mg/dL 469 (H) 629 (H)    Inpatient Diabetes Program Recommendations Correction (SSI): Add Novolog sensitive Q4 while on CL diet - Increase to tidwc when diet advanced. HgbA1C: Check HgbA1C to assess glycemic control prior to hospitalization  Note: Will follow daily while inpatient. Thank you. Ailene Ards, RD, LDN, CDE Inpatient Diabetes Coordinator [phone]

## 2013-06-27 NOTE — Plan of Care (Signed)
Dr Berneice Heinrich notifed of patients B/P elevation ,glucose level on am lab and abdominal distension.No new orders given

## 2013-06-27 NOTE — Plan of Care (Signed)
Problem: Phase II Progression Outcomes Goal: Ambulate in halls 4-6 x day Outcome: Not Progressing Sat up in chair today for 2 hours. Tolerated well. Goal: Tolerates clear liquids POD #1 Outcome: Progressing Abdomen distended and firm.Has hd 1 small bm today.

## 2013-06-27 NOTE — Progress Notes (Signed)
Patient is cool and clammy.  Bp is elevated.  HR below 100 after 5mg  IV lopressor.  Notified Dr. Brunilda Payor of patient's vital signs, cool and clammy skin, distended, taut abdomen and Pain "8" of 10.  Patient given 4 mg IV morphine and still rates pain "8" of 10.

## 2013-06-27 NOTE — Progress Notes (Signed)
Paged Jetta Lout, NP.   Pt is markedly confused based on earlier assessment. Abdomin is more distended.   Vitals: 135/81, HR 133, T 97.6, SPO2 96  Ordered labs and test under sepsis protocol, will page Dr Laverle Patter if abnormal. Per Cherlynn Polo, NP.

## 2013-06-28 LAB — BASIC METABOLIC PANEL
CO2: 28 mEq/L (ref 19–32)
Calcium: 8.8 mg/dL (ref 8.4–10.5)
Chloride: 91 mEq/L — ABNORMAL LOW (ref 96–112)
Creatinine, Ser: 1.41 mg/dL — ABNORMAL HIGH (ref 0.50–1.35)
GFR calc Af Amer: 56 mL/min — ABNORMAL LOW (ref >90)
GFR calc non Af Amer: 49 mL/min — ABNORMAL LOW (ref >90)

## 2013-06-28 LAB — URINE CULTURE
Colony Count: NO GROWTH
Culture: NO GROWTH

## 2013-06-28 LAB — PROCALCITONIN: Procalcitonin: 1.43 ng/mL

## 2013-06-28 MED ORDER — SODIUM CHLORIDE 0.9 % IV BOLUS (SEPSIS)
1000.0000 mL | Freq: Once | INTRAVENOUS | Status: AC
  Administered 2013-06-28: 1000 mL via INTRAVENOUS

## 2013-06-28 NOTE — Progress Notes (Signed)
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 TRUS volume 193gm with large median lobe. Path T3bN0Mx with multifocal positive margins including bladder neck, seminal vesicals.  2 - Urine Leak - Pt with very high JP output and JP Cr 6.8 c/w urine leak. CTcystogram 11/19 confirms small anterior anastomotic leak, JP in good position. Changed to vented foley suction 11/21.   3 - Ileus - Pt with persistent nausea and occasional emesis post-op. CT cystogram 06/2013 confirms ileus picture, no obstruction / abscess / perforation. NGT placed 11/20 PM with 4l output and immediate symptom relief.   Today Keith Bates feels a much better with NGT. Passing flatus and had small BM yesterday.  Objective: Vital signs in last 24 hours: Temp:  [98 F (36.7 C)-99.1 F (37.3 C)] 99.1 F (37.3 C) (11/21 1600) Pulse Rate:  [72-109] 88 (11/21 1800) Resp:  [11-25] 16 (11/21 1800) BP: (134-188)/(76-123) 166/91 mmHg (11/21 1800) SpO2:  [95 %-100 %] 99 % (11/21 1800) Last BM Date: 06/27/13  Intake/Output from previous day: 11/20 0701 - 11/21 0700 In: 3033 [P.O.:300; I.V.:2400; IV Piggyback:333] Out: 7135 [Urine:1660; Emesis/NG output:4350; Drains:1125] Intake/Output this shift: Total I/O In: 1250 [I.V.:1000; IV Piggyback:250] Out: 2350 [Urine:500; Emesis/NG output:1150; Drains:700]  General appearance: alert, cooperative and appears stated age Head: Normocephalic, without obvious abnormality, atraumatic Eyes: conjunctivae/corneas clear. PERRL, EOM's intact. Fundi benign. Ears: normal TM's and external ear canals both ears Nose: Nares normal. Septum midline. Mucosa normal. No drainage or sinus tenderness. Throat: lips, mucosa, and tongue normal; teeth and gums normal Neck: no adenopathy, no carotid bruit, no JVD, supple, symmetrical, trachea midline  and thyroid not enlarged, symmetric, no tenderness/mass/nodules Back: symmetric, no curvature. ROM normal. No CVA tenderness. Resp: clear to auscultation bilaterally Chest wall: no tenderness Cardio: regular rate and rhythm, S1, S2 normal, no murmur, click, rub or gallop GI: soft, non-tender; bowel sounds normal; no masses,  no organomegaly and NGT i place with bilious efflux.  Male genitalia: normal, foley c/d/i with yellow urine on light traction. Vented suction arranged at 40mm HG wtih 18G angiocath blow-off x 2 in line with tubing.  Extremities: extremities normal, atraumatic, no cyanosis or edema Pulses: 2+ and symmetric Skin: Skin color, texture, turgor normal. No rashes or lesions Lymph nodes: Cervical, supraclavicular, and axillary nodes normal. Neurologic: Grossly normal Incision/Wound: Port sites c/d/i. JP site with copious uriniferous fluid.   Lab Results:   Recent Labs  06/27/13 0210  WBC 14.1*  HGB 16.6  HCT 47.9  PLT 283   BMET  Recent Labs  06/27/13 1300 06/28/13 0330  NA 129* 129*  K 4.4 4.7  CL 88* 91*  CO2 29 28  GLUCOSE 194* 148*  BUN 55* 52*  CREATININE 1.61* 1.41*  CALCIUM 9.2 8.8   PT/INR  Recent Labs  06/27/13 0210  LABPROT 14.3  INR 1.13   ABG No results found for this basename: PHART, PCO2, PO2, HCO3,  in the last 72 hours  Studies/Results: Dg Chest Port 1 View  06/27/2013   CLINICAL DATA:  Sepsis  EXAM: PORTABLE CHEST - 1 VIEW  COMPARISON:  None.  FINDINGS: Decreased lung volume. Bibasilar airspace disease may represent atelectasis or pneumonia. Negative for heart failure or effusion. Heart size is normal.  Advanced degenerative change in both shoulder joints.  IMPRESSION: Low lung volumes with bibasilar atelectasis/infiltrate.   Electronically Signed  By: Marlan Palau M.D.   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 Path discussed with pt. Will likely need adjuvant therapy eventually.  2 - Urine Leak - relative outputs stable about 50/50 drain via foley. Placed on vented foley today. If does not improve over next several days will consider re-image and possible neph tubes vs. revision surgery.   3 - Ileus - Likely from peritoneal irritation from urine leak. No obstruction by imaging. Continue reglan, continue daily ducolax spp. Now with NGT and will follow relative outputs. Will consider PICC/TPN if not improving. 1l bolus x1 as behind on fluids with NG output.   [facility], Becker [name] 06/28/2013

## 2013-06-28 NOTE — Clinical Documentation Improvement (Signed)
Please clarify the underlying diagnosis responsible for abnormal BUN/CR/GFR and document in pn or d/c summary    Possible Clinical Conditions?                                   Other Condition___________________                 Cannot Clinically Determine_________   Supporting Information: Risk Factors:  Prostate Ca Dehydration per pn 06/27/13 Diagnostics Component      BUN  Latest Ref Rng      6 - 23 mg/dL  84/69/6295      29 (H)  06/24/2013      27 (H)  06/25/2013      36 (H)  06/26/2013      46 (H)   Component      Creatinine  Latest Ref Rng      0.50 - 1.35 mg/dL  28/41/3244      0.10 (H)  06/24/2013      1.64 (H)  06/25/2013      1.88 (H)  06/26/2013      1.57 (H)   Component      GFR calc Af Amer  Latest Ref Rng      >90 mL/min  06/23/2013      36 (L)  06/24/2013      47 (L)  06/25/2013      40 (L)  06/26/2013      49 (L)   Treatment:  Monitoring  Thank You, Enis Slipper ,RN, BSN, MSN/Inf, CCDS Clinical Documentation Specialist [facility] Dept Pager: [phone] / E-mail: [email or web address]  [phone]  [facility]- [facility] Information Management

## 2013-06-29 LAB — BASIC METABOLIC PANEL
Calcium: 8.5 mg/dL (ref 8.4–10.5)
Chloride: 97 mEq/L (ref 96–112)
GFR calc Af Amer: 84 mL/min — ABNORMAL LOW (ref >90)
GFR calc non Af Amer: 73 mL/min — ABNORMAL LOW (ref >90)
Glucose, Bld: 140 mg/dL — ABNORMAL HIGH (ref 70–99)
Sodium: 133 mEq/L — ABNORMAL LOW (ref 135–145)

## 2013-06-29 LAB — PREALBUMIN: Prealbumin: 13.8 mg/dL — ABNORMAL LOW (ref 17.0–34.0)

## 2013-06-29 LAB — DIFFERENTIAL
Basophils Absolute: 0 10*3/uL (ref 0.0–0.1)
Basophils Relative: 0 % (ref 0–1)
Eosinophils Absolute: 0.1 10*3/uL (ref 0.0–0.7)
Eosinophils Relative: 1 % (ref 0–5)
Neutrophils Relative %: 70 % (ref 43–77)

## 2013-06-29 LAB — TRIGLYCERIDES: Triglycerides: 136 mg/dL (ref <150)

## 2013-06-29 LAB — CBC
MCH: 31.1 pg (ref 26.0–34.0)
MCHC: 33.6 g/dL (ref 30.0–36.0)
MCV: 92.6 fL (ref 78.0–100.0)
Platelets: 246 10*3/uL (ref 150–400)
RDW: 13 % (ref 11.5–15.5)
WBC: 11.5 10*3/uL — ABNORMAL HIGH (ref 4.0–10.5)

## 2013-06-29 LAB — PROCALCITONIN: Procalcitonin: 0.89 ng/mL

## 2013-06-29 LAB — PHOSPHORUS: Phosphorus: 2.3 mg/dL (ref 2.3–4.6)

## 2013-06-29 LAB — MAGNESIUM: Magnesium: 2.7 mg/dL — ABNORMAL HIGH (ref 1.5–2.5)

## 2013-06-29 LAB — GLUCOSE, CAPILLARY: Glucose-Capillary: 116 mg/dL — ABNORMAL HIGH (ref 70–99)

## 2013-06-29 MED ORDER — FAT EMULSION 20 % IV EMUL
250.0000 mL | INTRAVENOUS | Status: AC
  Filled 2013-06-29: qty 250

## 2013-06-29 MED ORDER — SODIUM CHLORIDE 0.9 % IJ SOLN
10.0000 mL | Freq: Two times a day (BID) | INTRAMUSCULAR | Status: DC
  Administered 2013-06-29: 10 mL
  Administered 2013-06-29: 20 mL
  Administered 2013-06-30 – 2013-07-08 (×9): 10 mL

## 2013-06-29 MED ORDER — TRACE MINERALS CR-CU-F-FE-I-MN-MO-SE-ZN IV SOLN
INTRAVENOUS | Status: AC
  Filled 2013-06-29: qty 1000

## 2013-06-29 MED ORDER — SODIUM CHLORIDE 0.9 % IJ SOLN
10.0000 mL | INTRAMUSCULAR | Status: DC | PRN
Start: 2013-06-29 — End: 2013-07-09
  Administered 2013-07-02 – 2013-07-07 (×5): 10 mL

## 2013-06-29 MED ORDER — KCL IN DEXTROSE-NACL 20-5-0.45 MEQ/L-%-% IV SOLN
INTRAVENOUS | Status: DC
  Administered 2013-06-29: 19:00:00 via INTRAVENOUS
  Filled 2013-06-29: qty 1000

## 2013-06-29 MED ORDER — INSULIN ASPART 100 UNIT/ML ~~LOC~~ SOLN
0.0000 [IU] | SUBCUTANEOUS | Status: DC
  Administered 2013-06-30 – 2013-07-02 (×8): 1 [IU] via SUBCUTANEOUS

## 2013-06-29 NOTE — Progress Notes (Signed)
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 TRUS volume 193gm with large median lobe. Path T3bN0Mx with multifocal positive margins including bladder neck, seminal vesicals.  2 - Urine Leak - Pt with very high JP output and JP Cr 6.8 c/w urine leak. CTcystogram 11/19 confirms small anterior anastomotic leak, JP in good position. Changed to vented foley suction 11/21.   3 - Ileus - Pt with persistent nausea and occasional emesis post-op. CT cystogram 06/2013 confirms ileus picture, no obstruction / abscess / perforation. NGT placed 11/20 PM with 4l output and immediate symptom relief. RX PICC/TPN 11/22.  Today Curly is stable. Much improved abd symptoms with NGT. No significant change in relative drain outputs on vented suction last 24 hrs at pressure .  Objective: Vital signs in last 24 hours: Temp:  [98.2 F (36.8 C)-99.1 F (37.3 C)] 98.2 F (36.8 C) (11/22 0400) Pulse Rate:  [71-94] 79 (11/22 0600) Resp:  [11-23] 15 (11/22 0600) BP: (134-171)/(72-96) 148/84 mmHg (11/22 0600) SpO2:  [97 %-100 %] 99 % (11/22 0600) Weight:  [63.3 kg (139 lb 8.8 oz)] 63.3 kg (139 lb 8.8 oz) (11/22 0400) Last BM Date: 06/27/13  Intake/Output from previous day: 11/21 0701 - 11/22 0700 In: 3300 [I.V.:2300; IV Piggyback:1000] Out: 3825 [Urine:900; Emesis/NG output:1825; Drains:1100] Intake/Output this shift:    General appearance: alert, cooperative and appears stated age Head: Normocephalic, without obvious abnormality, atraumatic Eyes: conjunctivae/corneas clear. PERRL, EOM's intact. Fundi benign. Ears: normal TM's and external ear canals both ears Nose: Nares normal. Septum midline. Mucosa normal. No drainage or sinus tenderness. Throat: lips, mucosa, and tongue normal; teeth and gums normal Neck: no adenopathy, no carotid  bruit, no JVD, supple, symmetrical, trachea midline and thyroid not enlarged, symmetric, no tenderness/mass/nodules Back: symmetric, no curvature. ROM normal. No CVA tenderness. Resp: clear to auscultation bilaterally Chest wall: no tenderness Cardio: regular rate and rhythm, S1, S2 normal, no murmur, click, rub or gallop GI: soft, non-tender; bowel sounds normal; no masses,  no organomegaly Male genitalia: normal, foley c/d/i with clear urine on light traction. Vented suction pressure increased to 87m Hg. Extremities: extremities normal, atraumatic, no cyanosis or edema Pulses: 2+ and symmetric Skin: Skin color, texture, turgor normal. No rashes or lesions Lymph nodes: Cervical, supraclavicular, and axillary nodes normal. Neurologic: Grossly normal  Lab Results:   Recent Labs  06/27/13 0210  WBC 14.1*  HGB 16.6  HCT 47.9  PLT 283   BMET  Recent Labs  06/28/13 0330 06/29/13 0330  NA 129* 133*  K 4.7 4.4  CL 91* 97  CO2 28 30  GLUCOSE 148* 140*  BUN 52* 31*  CREATININE 1.41* 1.01  CALCIUM 8.8 8.5   PT/INR  Recent Labs  06/27/13 0210  LABPROT 14.3  INR 1.13   ABG No results found for this basename: PHART, PCO2, PO2, HCO3,  in the last 72 hours  Studies/Results: No results found.  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 Path discussed with pt. Will likely need adjuvant therapy eventually.  2 - Urine Leak - relative outputs  stable about 50/50 drain via foley. Increased suction pressure on vent foley to just enoguth to see urine motion in tubing, 70mm Hg, vents working properly. If does not improve over next several days will consider re-image and possible neph tubes vs. revision surgery.   3 - Ileus - Likely from peritoneal irritation from urine leak. No obstruction by imaging. Continue reglan, continue daily ducolax spp. Now with NGT and will follow relative outputs. RX'd PICC/TPN today as I am concerned this will be present for at least several more days and pt not received good nutrition in some time.   [facility], Janett Kamath 06/29/2013

## 2013-06-29 NOTE — Progress Notes (Signed)
PARENTERAL NUTRITION CONSULT NOTE - INITIAL  Pharmacy Consult for TNA Indication: post-op ileus  Allergies  Allergen Reactions  . Penicillins Rash   Patient Measurements: Height: 5\' 7"  (170.2 cm) Weight: 139 lb 8.8 oz (63.3 kg) IBW/kg (Calculated) : 66.1 TBW: 63kg  Vital Signs: Temp: 98.7 F (37.1 C) (11/22 1200) Temp src: Oral (11/22 1200) BP: 149/75 mmHg (11/22 1400) Pulse Rate: 69 (11/22 1400) Intake/Output from previous day: 11/21 0701 - 11/22 0700 In: 3400 [I.V.:2400; IV Piggyback:1000] Out: 3825 [Urine:900; Emesis/NG output:1825; Drains:1100] Intake/Output from this shift: Total I/O In: 700 [I.V.:700] Out: 1125 [Urine:600; Emesis/NG output:300; Drains:225]  Labs:  Recent Labs  06/27/13 0210 06/29/13 0724  WBC 14.1* 11.5*  HGB 16.6 15.1  HCT 47.9 45.0  PLT 283 246  APTT 26  --   INR 1.13  --     Recent Labs  06/27/13 0210 06/27/13 1300 06/28/13 0330 06/29/13 0330 06/29/13 0724  NA 130* 129* 129* 133*  --   K 4.1 4.4 4.7 4.4  --   CL 83* 88* 91* 97  --   CO2 32 29 28 30   --   GLUCOSE 243* 194* 148* 140*  --   BUN 56* 55* 52* 31*  --   CREATININE 1.91* 1.61* 1.41* 1.01  --   CALCIUM 9.7 9.2 8.8 8.5  --   MG  --   --   --   --  2.7*  PHOS  --   --   --   --  2.3  PROT 7.4  --   --   --   --   ALBUMIN 3.4*  --   --   --   --   AST 14  --   --   --   --   ALT 9  --   --   --   --   ALKPHOS 72  --   --   --   --   BILITOT 1.0  --   --   --   --   PREALBUMIN  --   --   --   --  13.8*  TRIG  --   --   --   --  136   Estimated Creatinine Clearance: 60.1 ml/min (by C-G formula based on Cr of 1.01).   No results found for this basename: GLUCAP,  in the last 72 hours  Medical History: Past Medical History  Diagnosis Date  . Enlarged prostate   . Prostate cancer   . Unspecified hemorrhoids without mention of complication   . Loss of weight   . GERD (gastroesophageal reflux disease)   . Environmental allergies   . Arthritis   . Hx of ulcer  disease   . Nocturia   . DDD (degenerative disc disease)    Medications:  Scheduled:  . feeding supplement (RESOURCE BREEZE)  1 Container Oral BID BM  . heparin subcutaneous  5,000 Units Subcutaneous Q8H  . metoCLOPramide (REGLAN) injection  10 mg Intravenous Q8H  . metoprolol  5 mg Intravenous Q6H  . pantoprazole (PROTONIX) IV  40 mg Intravenous Q12H  . sodium chloride  10-40 mL Intracatheter Q12H   Infusions:  . dextrose 5 % and 0.45 % NaCl with KCl 20 mEq/L 100 mL/hr at 06/29/13 1130  . Marland KitchenTPN (CLINIMIX-E) Adult     And  . fat emulsion     Insulin Requirements in the past 24 hours:  None ordered, no hx DM Will add sensitive Novolog scale  with serum glucose 140's and no po intake.  Current Nutrition & IV fluids NPO D5 1/2NS with 20 KCl/liter at 100 ml/hr  Assessment: 5 yoM with high-risk prostate cancer s/p prostatectomy  & lymphadenectomy 11/14.  Developed post-op ileus, confirmed by CT. Day 8 post-op: begin TNA per pharmacy 11/22  Lytes: Na 133, Magnesium 2.7  LFT's: wnl  Prealbumin: 13.8 (11/22)  Triglyerides: 136 (11/22)  Nutritional Goals: per RD 1750-1950 kCal 85-100 grams of protein per day Clinimix E 5/15 at goal rate of 41ml/hr will deliver 90 gm protein & 1760 Kcal/ 24 hr.  Plan:   Begin TNA at 40 ml/hr, advance as tolerated to goal rate  TNA labs Mon, Thurs  Daily MVI and trace elements  Adjust IV rate to 60 ml/hr when TNA begins  Add sensitive Novolog scale q6hr  Otho Bellows PharmD Pager [phone] 06/29/2013, 3:30 PM

## 2013-06-30 LAB — GLUCOSE, CAPILLARY
Glucose-Capillary: 125 mg/dL — ABNORMAL HIGH (ref 70–99)
Glucose-Capillary: 122 mg/dL — ABNORMAL HIGH (ref 70–99)
Glucose-Capillary: 129 mg/dL — ABNORMAL HIGH (ref 70–99)
Glucose-Capillary: 117 mg/dL — ABNORMAL HIGH (ref 70–99)

## 2013-06-30 LAB — BASIC METABOLIC PANEL
BUN: 21 mg/dL (ref 6–23)
CO2: 27 mEq/L (ref 19–32)
Calcium: 8.8 mg/dL (ref 8.4–10.5)
Creatinine, Ser: 0.85 mg/dL (ref 0.50–1.35)
GFR calc non Af Amer: 86 mL/min — ABNORMAL LOW (ref >90)
Glucose, Bld: 124 mg/dL — ABNORMAL HIGH (ref 70–99)

## 2013-06-30 LAB — PHOSPHORUS: Phosphorus: 2.3 mg/dL (ref 2.3–4.6)

## 2013-06-30 MED ORDER — TRACE MINERALS CR-CU-F-FE-I-MN-MO-SE-ZN IV SOLN
INTRAVENOUS | Status: AC
  Administered 2013-06-30: 11:00:00 via INTRAVENOUS
  Filled 2013-06-30: qty 1000

## 2013-06-30 MED ORDER — TRACE MINERALS CR-CU-F-FE-I-MN-MO-SE-ZN IV SOLN
INTRAVENOUS | Status: DC
  Administered 2013-06-30: 18:00:00 via INTRAVENOUS
  Filled 2013-06-30: qty 1000

## 2013-06-30 MED ORDER — KCL IN DEXTROSE-NACL 20-5-0.45 MEQ/L-%-% IV SOLN
INTRAVENOUS | Status: AC
  Administered 2013-07-01: 06:00:00 via INTRAVENOUS
  Filled 2013-06-30 (×2): qty 1000

## 2013-06-30 MED ORDER — TRACE MINERALS CR-CU-F-FE-I-MN-MO-SE-ZN IV SOLN
INTRAVENOUS | Status: DC
  Filled 2013-06-30: qty 2000

## 2013-06-30 MED ORDER — FAT EMULSION 20 % IV EMUL
250.0000 mL | INTRAVENOUS | Status: DC
  Administered 2013-06-30: 250 mL via INTRAVENOUS
  Filled 2013-06-30: qty 250

## 2013-06-30 MED ORDER — FAT EMULSION 20 % IV EMUL
250.0000 mL | INTRAVENOUS | Status: DC
  Filled 2013-06-30: qty 250

## 2013-06-30 MED ORDER — FAT EMULSION 20 % IV EMUL
250.0000 mL | INTRAVENOUS | Status: AC
  Administered 2013-06-30: 250 mL via INTRAVENOUS
  Filled 2013-06-30: qty 250

## 2013-06-30 NOTE — Progress Notes (Addendum)
NUTRITION FOLLOW UP  Intervention:   TPN to begin with Clinimix E 5/15 at 40 ml/hr, Lipids 20% at 10 ml/hr.  (1162 kcal, 48 gm protein/day)    TPN goal of 75 ml/hr plus 20% lipids at 10 ml/hr to provide:  1758 kcal and 90 gm protein daily which will meet 100% of estimated protein and calorie needs.  Nutrition Dx:   Inadequate oral intake related to emesis as evidenced by 10% intake of breakfast; ongoing- varied PO intake 25-100%, clear liquids today  Goal:   Pt to meet >/= 90% of their estimated nutrition needs; not likely being met  Monitor:   Diet advancement PO intake; varied 25-100% Weight; 3 lb weight increase from 11/10 to 11/20 Labs; low sodium, high BUN, low GFR  Assessment:   Patient with history of high risk prostate cancer, admitted for radial prostatectomy. Pt had robotic radical prostatectomy with bilateral pelvic lymphadenectomy 06/21/2013.   Patient with post op ileus.  Now NPO.  TPN to begin. NG tube in place.  Height: Ht Readings from Last 1 Encounters:  06/27/13 5\' 7"  (1.702 m)    Weight Status:   Wt Readings from Last 1 Encounters:  06/30/13 136 lb 0.4 oz (61.7 kg)    Re-estimated needs:  Kcal: 1750-1950 kcal  Protein: 85-100 g  Fluid: 1.8-2 L/day  Skin: abdominal incisions  Diet Order:  NPO   Intake/Output Summary (Last 24 hours) at 06/30/13 1016 Last data filed at 06/30/13 1000  Gross per 24 hour  Intake   1660 ml  Output   2500 ml  Net   -840 ml    Last BM: 11/20   Labs:   Recent Labs Lab 06/28/13 0330 06/29/13 0330 06/29/13 0724 06/30/13 0332  NA 129* 133*  --  135  K 4.7 4.4  --  4.3  CL 91* 97  --  102  CO2 28 30  --  27  BUN 52* 31*  --  21  CREATININE 1.41* 1.01  --  0.85  CALCIUM 8.8 8.5  --  8.8  MG  --   --  2.7* 2.5  PHOS  --   --  2.3 2.3  GLUCOSE 148* 140*  --  124*    CBG (last 3)   Recent Labs  06/29/13 2326 06/30/13 0355 06/30/13 0756  GLUCAP 117* 118* 125*    Scheduled Meds: . feeding  supplement (RESOURCE BREEZE)  1 Container Oral BID BM  . heparin subcutaneous  5,000 Units Subcutaneous Q8H  . insulin aspart  0-9 Units Subcutaneous Q4H  . metoCLOPramide (REGLAN) injection  10 mg Intravenous Q8H  . metoprolol  5 mg Intravenous Q6H  . pantoprazole (PROTONIX) IV  40 mg Intravenous Q12H  . sodium chloride  10-40 mL Intracatheter Q12H    Continuous Infusions: . dextrose 5 % and 0.45 % NaCl with KCl 20 mEq/L    . Marland KitchenTPN (CLINIMIX-E) Adult     And  . fat emulsion    . Marland KitchenTPN (CLINIMIX-E) Adult     And  . fat emulsion      Oran Rein, RD, LDN Clinical Inpatient Dietitian Pager:  [phone] Weekend and after hours pager:  [phone]

## 2013-06-30 NOTE — Progress Notes (Signed)
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 TRUS volume 193gm with large median lobe. Path T3bN0Mx with multifocal positive margins including bladder neck, seminal vesicals.  2 - Urine Leak - Pt with very high JP output and JP Cr 6.8 c/w urine leak. CTcystogram 11/19 confirms small anterior anastomotic leak, JP in good position. Changed to vented foley suction 11/21 and now relative output improving again.  3 - Ileus - Pt with persistent nausea and occasional emesis post-op. CT cystogram 06/2013 confirms ileus picture, no obstruction / abscess / perforation. NGT placed 11/20 PM with 4l output and immediate symptom relief. RUE PICC placed 11/22 with plan for TPN top begin 11/23.  Today Keith Bates is improved some. Continues to have some flatus. Significant improvement in relative foley / drain outputs on higher pressure vented suction, only 50cc drain site overnight v. 600 foley.   Objective: Vital signs in last 24 hours: Temp:  [97.8 F (36.6 C)-98.7 F (37.1 C)] 97.8 F (36.6 C) (11/23 0400) Pulse Rate:  [54-83] 65 (11/23 0700) Resp:  [11-16] 12 (11/23 0700) BP: (130-170)/(68-87) 150/83 mmHg (11/23 0700) SpO2:  [94 %-100 %] 98 % (11/23 0700) Weight:  [61.7 kg (136 lb 0.4 oz)] 61.7 kg (136 lb 0.4 oz) (11/23 0400) Last BM Date: 06/27/13  Intake/Output from previous day: 11/22 0701 - 11/23 0700 In: 1660 [I.V.:1660] Out: 3005 [Urine:1500; Emesis/NG output:1150; Drains:355] Intake/Output this shift:    General appearance: alert, cooperative and appears stated age Head: Normocephalic, without obvious abnormality, atraumatic Eyes: conjunctivae/corneas clear. PERRL, EOM's intact. Fundi benign. Ears: normal TM's and external ear canals both ears Nose: Nares normal. Septum midline. Mucosa normal. No drainage or sinus  tenderness. Throat: lips, mucosa, and tongue normal; teeth and gums normal Neck: no adenopathy, no carotid bruit, no JVD, supple, symmetrical, trachea midline and thyroid not enlarged, symmetric, no tenderness/mass/nodules Back: symmetric, no curvature. ROM normal. No CVA tenderness. Resp: clear to auscultation bilaterally Chest wall: no tenderness Cardio: regular rate and rhythm, S1, S2 normal, no murmur, click, rub or gallop GI: soft, non-tender; bowel sounds normal; no masses,  no organomegaly and NGT with continued bilious output. Abd flat and non-tedner. + BS. Male genitalia: normal, foley in place on vented suction, changed to intermitant. JP with uriniferous fluid, improved.  Extremities: extremities normal, atraumatic, no cyanosis or edema and RUE PICC c/d/i. Pulses: 2+ and symmetric Skin: Skin color, texture, turgor normal. No rashes or lesions Lymph nodes: Cervical, supraclavicular, and axillary nodes normal. Neurologic: Grossly normal Incision/Wound: Recent port sites / extraction sites c/d/i.  Lab Results:   Recent Labs  06/29/13 0724  WBC 11.5*  HGB 15.1  HCT 45.0  PLT 246   BMET  Recent Labs  06/29/13 0330 06/30/13 0332  NA 133* 135  K 4.4 4.3  CL 97 102  CO2 30 27  GLUCOSE 140* 124*  BUN 31* 21  CREATININE 1.01 0.85  CALCIUM 8.5 8.8   PT/INR No results found for this basename: LABPROT, INR,  in the last 72 hours ABG No results found for this basename: PHART, PCO2, PO2, HCO3,  in the last 72 hours  Studies/Results: No results found.  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 Path discussed with pt. Will likely need adjuvant therapy eventually.  2 - Urine Leak - relative outputs now improving again with higher-pressure vented foley suction. Changed to intermitant today and NSG informed that if relative outputs worsen we may make continuous again and discuss with MD.   3 - Ileus - Likely from peritoneal irritation from urine leak. No obstruction by imaging. + flatus encouraging. Will likely clamp NGT tomorrow if continuing to make progress. Appreciate TNA pharmacist and would like to continue this until clearly re-established on PO intake as he is likely now malnurished.     Keith Bates, Keith Bates 06/30/2013

## 2013-06-30 NOTE — Progress Notes (Signed)
Patient's Foley attached to low intermittent suction (had been on low continuous prior) this AM by MD due to increased UOP and decreased JP output overnight.  This RN switched Foley back to low continuous suction at 1200 due to lower UOP (approx 25cc every hour).  Total urine output as of 1600 is 250cc.  JP drainage has picked back up from last night, and total as of 1600 is 275cc.  MD on call notified.  Will discuss with Dr. Berneice Heinrich this evening, and call back if any new orders.  Will continue to monitor.

## 2013-06-30 NOTE — Progress Notes (Addendum)
PARENTERAL NUTRITION CONSULT NOTE - Follow up  Pharmacy Consult for TNA Indication: post-op ileus  Allergies  Allergen Reactions  . Penicillins Rash   Patient Measurements: Height: 5\' 7"  (170.2 cm) Weight: 136 lb 0.4 oz (61.7 kg) IBW/kg (Calculated) : 66.1 TBW: 63kg  Vital Signs: Temp: 97.8 F (36.6 C) (11/23 0400) Temp src: Oral (11/23 0400) BP: 150/83 mmHg (11/23 0700) Pulse Rate: 65 (11/23 0700) Intake/Output from previous day: 11/22 0701 - 11/23 0700 In: 1660 [I.V.:1660] Out: 3005 [Urine:1500; Emesis/NG output:1150; Drains:355] Intake/Output from this shift:    Labs:  Recent Labs  06/29/13 0724  WBC 11.5*  HGB 15.1  HCT 45.0  PLT 246    Recent Labs  06/28/13 0330 06/29/13 0330 06/29/13 0724 06/30/13 0332  NA 129* 133*  --  135  K 4.7 4.4  --  4.3  CL 91* 97  --  102  CO2 28 30  --  27  GLUCOSE 148* 140*  --  124*  BUN 52* 31*  --  21  CREATININE 1.41* 1.01  --  0.85  CALCIUM 8.8 8.5  --  8.8  MG  --   --  2.7* 2.5  PHOS  --   --  2.3 2.3  PREALBUMIN  --   --  13.8*  --   TRIG  --   --  136  --    Estimated Creatinine Clearance: 69.6 ml/min (by C-G formula based on Cr of 0.85).    Recent Labs  06/29/13 1951 06/29/13 2326 06/30/13 0355  GLUCAP 116* 117* 118*   Medications:  Scheduled:  . feeding supplement (RESOURCE BREEZE)  1 Container Oral BID BM  . heparin subcutaneous  5,000 Units Subcutaneous Q8H  . insulin aspart  0-9 Units Subcutaneous Q4H  . metoCLOPramide (REGLAN) injection  10 mg Intravenous Q8H  . metoprolol  5 mg Intravenous Q6H  . pantoprazole (PROTONIX) IV  40 mg Intravenous Q12H  . sodium chloride  10-40 mL Intracatheter Q12H   Infusions:  . dextrose 5 % and 0.45 % NaCl with KCl 20 mEq/L 60 mL/hr at 06/29/13 1830  . Marland KitchenTPN (CLINIMIX-E) Adult     And  . 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  Developed post-op ileus, confirmed by CT. Day 8 post-op: begin TNA per pharmacy 11/22 pm.  Lytes: Na 135, Magnesium 2.5, Phos 2.3  LFT's: wnl  Prealbumin: 13.8 (11/22)  Triglyerides: 136 (11/22)  11/23: Lytes normal, UOP 1.3 L (0.9 ml/kg/hr), 1L emesis, 551ml/drain, CBG's < 120. Unfortunately TNA was not started.  Nutritional Goals: per RD 1750-1950 kCal 85-100 grams of protein per day Clinimix E 5/15 at goal rate of 51ml/hr will deliver 90 gm protein & 1760 Kcal/ 24 hr.  Plan: amended today 1100: made aware that TNA was not started last night, bag never mixed, no one taking care of patient (nursing/pharmacy) questioned why TNA not mixed/infusing.  Will mix TNA for this am, infuse at 40 ml/hr, Lipids at 10 ml/hr. Full labs in am.  Will not advance TNA today at 1800, continue 40 ml/hr at 1800 and advance as tolerated to goal rate  TNA labs Mon, Thurs  Daily MVI and trace elements  Adjust IV rate to 45 ml/hr when TNA begins  Continue sensitive Novolog scale q6hr  Otho Bellows PharmD Pager [phone] 06/30/2013, 8:00 AM

## 2013-07-01 ENCOUNTER — Encounter: Payer: Self-pay | Admitting: *Deleted

## 2013-07-01 ENCOUNTER — Encounter (HOSPITAL_COMMUNITY): Payer: Self-pay | Admitting: Urology

## 2013-07-01 ENCOUNTER — Inpatient Hospital Stay (HOSPITAL_COMMUNITY): Payer: Medicare Other

## 2013-07-01 LAB — COMPREHENSIVE METABOLIC PANEL
ALT: 15 U/L (ref 0–53)
AST: 22 U/L (ref 0–37)
Albumin: 2.3 g/dL — ABNORMAL LOW (ref 3.5–5.2)
Alkaline Phosphatase: 54 U/L (ref 39–117)
Calcium: 9 mg/dL (ref 8.4–10.5)
GFR calc non Af Amer: 84 mL/min — ABNORMAL LOW (ref >90)
Potassium: 4.1 mEq/L (ref 3.5–5.1)
Sodium: 135 mEq/L (ref 135–145)
Total Protein: 5.7 g/dL — ABNORMAL LOW (ref 6.0–8.3)

## 2013-07-01 LAB — CBC
HCT: 41.5 % (ref 39.0–52.0)
Hemoglobin: 14.3 g/dL (ref 13.0–17.0)
MCH: 31.6 pg (ref 26.0–34.0)
MCV: 91.6 fL (ref 78.0–100.0)
Platelets: 263 10*3/uL (ref 150–400)
RBC: 4.53 MIL/uL (ref 4.22–5.81)
WBC: 17.8 10*3/uL — ABNORMAL HIGH (ref 4.0–10.5)

## 2013-07-01 LAB — TRIGLYCERIDES: Triglycerides: 127 mg/dL (ref <150)

## 2013-07-01 LAB — DIFFERENTIAL
Basophils Absolute: 0 10*3/uL (ref 0.0–0.1)
Eosinophils Relative: 1 % (ref 0–5)
Lymphocytes Relative: 9 % — ABNORMAL LOW (ref 12–46)
Lymphs Abs: 1.6 10*3/uL (ref 0.7–4.0)
Monocytes Absolute: 1.5 10*3/uL — ABNORMAL HIGH (ref 0.1–1.0)
Monocytes Relative: 8 % (ref 3–12)

## 2013-07-01 LAB — GLUCOSE, CAPILLARY
Glucose-Capillary: 122 mg/dL — ABNORMAL HIGH (ref 70–99)
Glucose-Capillary: 130 mg/dL — ABNORMAL HIGH (ref 70–99)
Glucose-Capillary: 90 mg/dL (ref 70–99)
Glucose-Capillary: 98 mg/dL (ref 70–99)
Glucose-Capillary: 112 mg/dL — ABNORMAL HIGH (ref 70–99)

## 2013-07-01 MED ORDER — IOHEXOL 300 MG/ML  SOLN
25.0000 mL | INTRAMUSCULAR | Status: AC
  Administered 2013-07-01 (×2): 25 mL via ORAL

## 2013-07-01 MED ORDER — BISACODYL 10 MG RE SUPP: 10.0000 mg | Freq: Once | RECTAL | Status: DC

## 2013-07-01 MED ORDER — TRACE MINERALS CR-CU-F-FE-I-MN-MO-SE-ZN IV SOLN
INTRAVENOUS | Status: AC
  Administered 2013-07-01: 19:00:00 via INTRAVENOUS
  Filled 2013-07-01: qty 2000

## 2013-07-01 MED ORDER — KCL IN DEXTROSE-NACL 20-5-0.45 MEQ/L-%-% IV SOLN
INTRAVENOUS | Status: DC
  Administered 2013-07-02 – 2013-07-08 (×6): via INTRAVENOUS
  Filled 2013-07-01 (×7): qty 1000

## 2013-07-01 MED ORDER — FAT EMULSION 20 % IV EMUL
250.0000 mL | INTRAVENOUS | Status: AC
  Administered 2013-07-01: 250 mL via INTRAVENOUS
  Filled 2013-07-01: qty 250

## 2013-07-01 MED ORDER — IOHEXOL 300 MG/ML  SOLN
100.0000 mL | Freq: Once | INTRAMUSCULAR | Status: AC | PRN
  Administered 2013-07-01: 100 mL via INTRAVENOUS

## 2013-07-01 NOTE — Progress Notes (Signed)
Visited patient in [id number] to provide support.  He indicated his prostate surgery of the week before last went well.  He hopes to be DC'd by Wednesday.  Young Berry, RN, BSN, [facility] Prostate Oncology Navigator [phone]

## 2013-07-01 NOTE — Progress Notes (Signed)
PARENTERAL NUTRITION CONSULT NOTE - Follow up  Pharmacy Consult for TNA Indication: post-op ileus  Allergies  Allergen Reactions  . Penicillins Rash   Patient Measurements: Height: 5\' 7"  (170.2 cm) Weight: 136 lb 0.4 oz (61.7 kg) IBW/kg (Calculated) : 66.1 TBW: 63kg  Vital Signs: Temp: 98.3 F (36.8 C) (11/24 0400) Temp src: Oral (11/24 0400) BP: 141/67 mmHg (11/24 0600) Pulse Rate: 64 (11/24 0600) Intake/Output from previous day: 11/23 0701 - 11/24 0700 In: 2195 [I.V.:1245; TPN:950] Out: 2095 [Urine:825; Emesis/NG output:850; Drains:420] Intake/Output from this shift:    Labs:  Recent Labs  06/29/13 0724 07/01/13 0410  WBC 11.5* 17.8*  HGB 15.1 14.3  HCT 45.0 41.5  PLT 246 263    Recent Labs  06/29/13 0330 06/29/13 0724 06/30/13 0332 07/01/13 0410  NA 133*  --  135 135  K 4.4  --  4.3 4.1  CL 97  --  102 101  CO2 30  --  27 28  GLUCOSE 140*  --  124* 132*  BUN 31*  --  21 21  CREATININE 1.01  --  0.85 0.89  CALCIUM 8.5  --  8.8 9.0  MG  --  2.7* 2.5 2.2  PHOS  --  2.3 2.3 2.9  PROT  --   --   --  5.7*  ALBUMIN  --   --   --  2.3*  AST  --   --   --  22  ALT  --   --   --  15  ALKPHOS  --   --   --  54  BILITOT  --   --   --  0.3  PREALBUMIN  --  13.8*  --   --   TRIG  --  136  --  127   Estimated Creatinine Clearance: 66.4 ml/min (by C-G formula based on Cr of 0.89).    Recent Labs  06/30/13 1953 07/01/13 0002 07/01/13 0401  GLUCAP 117* 135* 122*   Medications:  Scheduled:  . feeding supplement (RESOURCE BREEZE)  1 Container Oral BID BM  . heparin subcutaneous  5,000 Units Subcutaneous Q8H  . insulin aspart  0-9 Units Subcutaneous Q4H  . metoCLOPramide (REGLAN) injection  10 mg Intravenous Q8H  . metoprolol  5 mg Intravenous Q6H  . pantoprazole (PROTONIX) IV  40 mg Intravenous Q12H  . sodium chloride  10-40 mL Intracatheter Q12H   Infusions:  . dextrose 5 % and 0.45 % NaCl with KCl 20 mEq/L 45 mL/hr at 07/01/13 0559  . Marland KitchenTPN  (CLINIMIX-E) Adult 40 mL/hr at 06/30/13 1756   And  . fat emulsion 250 mL (06/30/13 1757)  . Marland KitchenTPN (CLINIMIX-E) Adult 40 mL/hr at 06/30/13 1117   And  . fat emulsion 250 mL (06/30/13 1118)   Insulin Requirements in the past 24 hours: 5 units.  Nutritional Goals:  RD recs: 85-100g of protein and 1750-1950 kCal per day. Clinimix E 5/15 at a goal rate of 62ml/hr + 20% fat emulsion at 28ml/hr to provide: 90g/day protein, 1758Kcal/day.  Current Nutrition:  Clinimix E 5/15 at 39ml/hr + 20% fat emulsion at 43ml/hr.  NPO  IVF: D5 1/2NS 20KCL at 43ml/hr.  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  Developed post-op ileus, confirmed by CT. 11/23 am began TNA per pharmacy. I/O about even. UOP and NG out yest.  Glucose - 117-132  Electrolytes - wnl, Corr Ca 10.4.  LFTs - wnl  TGs - 127  Prealbumin - 13.8(11/22)  TPN Access: PICC TPN day #: 2  Plan:   Cont Clinimix E 5/15, increase to 37ml/hr.  Cont 20% fat emulsion at 79ml/hr.  Plan to advance as tolerated to the goal rate.  TNA to contain standard multivitamins and trace elements.  Reduce IVF to 78ml/hr.  Cont SSI sens q4h.   TNA lab panels on Mondays & Thursdays.  Bmet, Mag and Phos in the AM.  F/u daily.  Charolotte Eke, PharmD, pager [phone]. 07/01/2013,8:18 AM.

## 2013-07-01 NOTE — Progress Notes (Signed)
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 TRUS volume 193gm with large median lobe. Path T3bN0Mx with multifocal positive margins including bladder neck, seminal vesicals.  2 - Urine Leak - Pt with very high JP output and JP Cr 6.8 c/w urine leak. CTcystogram 11/19 confirms small anterior anastomotic leak, JP in good position. Changed to vented foley suction 11/21 and now relative output improving again. Repeat CT 11/24 with improved pelvic fluid.   3 - Ileus - Pt with persistent nausea and occasional emesis post-op. CT cystogram 06/2013 confirms ileus picture, no obstruction / abscess / perforation. NGT placed 11/20 PM with 4l output and immediate symptom relief. RUE PICC placed 11/22 with plan for TPN top begin 11/23.  Today Keith Bates is making progress from GI perspective. BM yesterday and today, tollerating NG clamped. CT today with improved pelvic fluid an unlikely ureteral injury (though cannot rule out completely)  Objective: Vital signs in last 24 hours: Temp:  [97.4 F (36.3 C)-98.9 F (37.2 C)] 98.9 F (37.2 C) (11/24 1600) Pulse Rate:  [58-74] 70 (11/24 1700) Resp:  [11-22] 15 (11/24 1700) BP: (112-165)/(47-108) 165/73 mmHg (11/24 1700) SpO2:  [97 %-100 %] 100 % (11/24 1700) Last BM Date: 06/30/13  Intake/Output from previous day: 11/23 0701 - 11/24 0700 In: 2195 [I.V.:1245; TPN:950] Out: 2095 [Urine:825; Emesis/NG output:850; Drains:420] Intake/Output this shift: Total I/O In: 400 [TPN:400] Out: 600 [Urine:400; Drains:200]  General appearance: alert, cooperative, appears stated age and wife at bedside Head: Normocephalic, without obvious abnormality, atraumatic Eyes: conjunctivae/corneas clear. PERRL, EOM's intact. Fundi benign. Ears: normal TM's and external ear canals both ears Nose: Nares normal.  Septum midline. Mucosa normal. No drainage or sinus tenderness. Throat: lips, mucosa, and tongue normal; teeth and gums normal Neck: no adenopathy, no carotid bruit, no JVD, supple, symmetrical, trachea midline and thyroid not enlarged, symmetric, no tenderness/mass/nodules Back: symmetric, no curvature. ROM normal. No CVA tenderness. Resp: clear to auscultation bilaterally Chest wall: no tenderness Cardio: regular rate and rhythm, S1, S2 normal, no murmur, click, rub or gallop GI: soft, non-tender; bowel sounds normal; no masses,  no organomegaly and NGT in place. clamped.  Male genitalia: normal, foley c/d/i on very light traction. uriniferous material on JP site drain.  Extremities: extremities normal, atraumatic, no cyanosis or edema and RUE PICC c/d/i.  Pulses: 2+ and symmetric Skin: Skin color, texture, turgor normal. No rashes or lesions Lymph nodes: Cervical, supraclavicular, and axillary nodes normal. Neurologic: Grossly normal Incision/Wound: Recent port sites and extraction sites c/d/i.   Lab Results:   Recent Labs  06/29/13 0724 07/01/13 0410  WBC 11.5* 17.8*  HGB 15.1 14.3  HCT 45.0 41.5  PLT 246 263   BMET  Recent Labs  06/30/13 0332 07/01/13 0410  NA 135 135  K 4.3 4.1  CL 102 101  CO2 27 28  GLUCOSE 124* 132*  BUN 21 21  CREATININE 0.85 0.89  CALCIUM 8.8 9.0   PT/INR No results found for this basename: LABPROT, INR,  in the last 72 hours ABG No results found for this basename: PHART, PCO2, PO2, HCO3,  in the last 72 hours  Studies/Results: Ct Abdomen Pelvis W Contrast  07/01/2013   CLINICAL DATA:  Status post robotic assisted laparoscopic radical prostatectomy on 06/21/2013. Known bladder leak, evaluate for ureteral leak/injury.  EXAM: CT ABDOMEN AND PELVIS WITH CONTRAST  TECHNIQUE:  Multidetector CT imaging of the abdomen and pelvis was performed using the standard protocol following bolus administration of intravenous contrast.  CONTRAST:   OMNIPAQUE IOHEXOL 300 MG/ML  SOLN  COMPARISON:  CT cystogram dated 06/26/2013  FINDINGS: Mild scarring with atelectasis at the lung bases.  Enteric tube terminates in the gastric body.  Liver, spleen, pancreas, and adrenal glands are within normal limits.  Gallbladder is unremarkable. No intrahepatic or extrahepatic ductal dilatation.  Kidneys are within normal limits. Mild bilateral hydroureteronephrosis.  No evidence of bowel obstruction.  Normal appendix.  Atherosclerotic calcifications of the abdominal aorta and branch vessels.  Thick-walled bladder with indwelling Foley catheter. Multiple layering bladder calculi.  Small volume pelvic ascites (series 5/ image 69), corresponding to known bladder leak, improved. At least one layering calculus is present within the fluid collection (series 2/image 92).  Indwelling right lower quadrant surgical drain.  Delayed imaging through the abdomen/pelvis was performed at 5 minutes, and no leak of extraluminal contrast is visualized. However, the bilateral ureters remain dilated but non-opacified at that time.  Degenerative changes of the visualized thoracolumbar spine. 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  IMPRESSION: No leak of extraluminal contrast is visualized on 5 minute delayed imaging. However, the bilateral ureters remain unopacified at that time.  Mild bilateral hydroureteronephrosis.  Thick-walled bladder with indwelling Foley catheter and multiple layering bladder calculi.  Small volume pelvic ascites, corresponding to known bladder leak, improved.  Indwelling right lower quadrant surgical drain.   Electronically Signed   By: Charline Bills M.D.   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 Path discussed with pt. Will likely need adjuvant therapy eventually.  2 - Urine Leak - relative outputs now improving overall with higher-pressure vented foley suction, though certainly some up an down. Continue continuous vented suction.   3 - Ileus - Likely from peritoneal irritation from urine leak. No obstruction by imaging. + BM's encouraging. Now on NG clamping trial, will remove tomorrow in does well overnight.   [facility], Dulce Martian 07/01/2013

## 2013-07-02 LAB — BASIC METABOLIC PANEL
BUN: 17 mg/dL (ref 6–23)
Chloride: 97 mEq/L (ref 96–112)
Creatinine, Ser: 0.86 mg/dL (ref 0.50–1.35)
GFR calc Af Amer: >90 mL/min (ref >90)
Glucose, Bld: 125 mg/dL — ABNORMAL HIGH (ref 70–99)
Potassium: 4.1 mEq/L (ref 3.5–5.1)

## 2013-07-02 LAB — PHOSPHORUS: Phosphorus: 3.5 mg/dL (ref 2.3–4.6)

## 2013-07-02 LAB — GLUCOSE, CAPILLARY
Glucose-Capillary: 130 mg/dL — ABNORMAL HIGH (ref 70–99)
Glucose-Capillary: 144 mg/dL — ABNORMAL HIGH (ref 70–99)

## 2013-07-02 MED ORDER — INSULIN ASPART 100 UNIT/ML ~~LOC~~ SOLN
0.0000 [IU] | Freq: Three times a day (TID) | SUBCUTANEOUS | Status: DC
  Administered 2013-07-02 – 2013-07-06 (×8): 1 [IU] via SUBCUTANEOUS
  Administered 2013-07-07 (×2): 2 [IU] via SUBCUTANEOUS
  Administered 2013-07-08 – 2013-07-09 (×4): 1 [IU] via SUBCUTANEOUS

## 2013-07-02 NOTE — Progress Notes (Signed)
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 TRUS volume 193gm with large median lobe. Path T3bN0Mx with multifocal positive margins including bladder neck, seminal vesicals.  2 - Urine Leak - Pt with very high JP output and JP Cr 6.8 c/w urine leak. CTcystogram 11/19 confirms small anterior anastomotic leak, JP in good position. Changed to vented foley suction 11/21 and now relative output improving again. Repeat CT 11/24 with improved pelvic fluid.   3 - Ileus - Pt with persistent nausea and occasional emesis post-op. CT cystogram 06/2013 confirms ileus picture, no obstruction / abscess / perforation. NGT placed 11/20 PM with 4l output and immediate symptom relief. RUE PICC placed 11/22 and TPN started 11/23. Tollerated NG clamping with continued large BM and NG removed 11/25.  Today Antwoin continues to make slow progress. JP output slowly trending better with vented foley suction / light traction, Ileus continues to resolve.   Objective: Vital signs in last 24 hours: Temp:  [97.4 F (36.3 C)-99.6 F (37.6 C)] 98.2 F (36.8 C) (11/25 0400) Pulse Rate:  [58-72] 66 (11/25 0600) Resp:  [11-25] 14 (11/25 0600) BP: (118-165)/(66-93) 156/68 mmHg (11/25 0600) SpO2:  [97 %-100 %] 98 % (11/25 0600) Weight:  [63.9 kg (140 lb 14 oz)] 63.9 kg (140 lb 14 oz) (11/25 0400) Last BM Date: 07/01/13  Intake/Output from previous day: 11/24 0701 - 11/25 0700 In: 2034 [I.V.:690; ZOX:0960] Out: 1555 [Urine:1150; Drains:405] Intake/Output this shift:    General appearance: alert, cooperative and appears stated age Head: Normocephalic, without obvious abnormality, atraumatic Eyes: conjunctivae/corneas clear. PERRL, EOM's intact. Fundi benign. Ears: normal TM's and external ear canals both ears Nose: Nares normal. Septum midline. Mucosa  normal. No drainage or sinus tenderness., NGT in placed and clamped. Removed.  Throat: lips, mucosa, and tongue normal; teeth and gums normal Neck: no adenopathy, no carotid bruit, no JVD, supple, symmetrical, trachea midline and thyroid not enlarged, symmetric, no tenderness/mass/nodules Back: symmetric, no curvature. ROM normal. No CVA tenderness. Resp: clear to auscultation bilaterally Chest wall: no tenderness Cardio: regular rate and rhythm, S1, S2 normal, no murmur, click, rub or gallop GI: soft, non-tender; bowel sounds normal; no masses,  no organomegaly Male genitalia: normal, foley c/d/i with urine in bag on vented suction and very light traction. JP with decreased but persistant uriniferous fluid.  Extremities: extremities normal, atraumatic, no cyanosis or edema Pulses: 2+ and symmetric Skin: Skin color, texture, turgor normal. No rashes or lesions Lymph nodes: Cervical, supraclavicular, and axillary nodes normal. Neurologic: Grossly normal Incision/Wound: Recent incision sites c/d/i.   Lab Results:   Recent Labs  07/01/13 0410  WBC 17.8*  HGB 14.3  HCT 41.5  PLT 263   BMET  Recent Labs  07/01/13 0410 07/02/13 0440  NA 135 131*  K 4.1 4.1  CL 101 97  CO2 28 28  GLUCOSE 132* 125*  BUN 21 17  CREATININE 0.89 0.86  CALCIUM 9.0 8.6   PT/INR No results found for this basename: LABPROT, INR,  in the last 72 hours ABG No results found for this basename: PHART, PCO2, PO2, HCO3,  in the last 72 hours  Studies/Results: Ct Abdomen Pelvis W Contrast  07/01/2013   CLINICAL DATA:  Status post robotic assisted laparoscopic radical prostatectomy on 06/21/2013. Known bladder leak, evaluate for ureteral leak/injury.  EXAM: CT ABDOMEN AND PELVIS WITH CONTRAST  TECHNIQUE: Multidetector CT imaging of the abdomen and pelvis was performed using the standard protocol following bolus administration of intravenous contrast.  CONTRAST:  OMNIPAQUE IOHEXOL 300 MG/ML  SOLN   COMPARISON:  CT cystogram dated 06/26/2013  FINDINGS: Mild scarring with atelectasis at the lung bases.  Enteric tube terminates in the gastric body.  Liver, spleen, pancreas, and adrenal glands are within normal limits.  Gallbladder is unremarkable. No intrahepatic or extrahepatic ductal dilatation.  Kidneys are within normal limits. Mild bilateral hydroureteronephrosis.  No evidence of bowel obstruction.  Normal appendix.  Atherosclerotic calcifications of the abdominal aorta and branch vessels.  Thick-walled bladder with indwelling Foley catheter. Multiple layering bladder calculi.  Small volume pelvic ascites (series 5/ image 69), corresponding to known bladder leak, improved. At least one layering calculus is present within the fluid collection (series 2/image 92).  Indwelling right lower quadrant surgical drain.  Delayed imaging through the abdomen/pelvis was performed at 5 minutes, and no leak of extraluminal contrast is visualized. However, the bilateral ureters remain dilated but non-opacified at that time.  Degenerative changes of the visualized thoracolumbar spine. 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  IMPRESSION: No leak of extraluminal contrast is visualized on 5 minute delayed imaging. However, the bilateral ureters remain unopacified at that time.  Mild bilateral hydroureteronephrosis.  Thick-walled bladder with indwelling Foley catheter and multiple layering bladder calculi.  Small volume pelvic ascites, corresponding to known bladder leak, improved.  Indwelling right lower quadrant surgical drain.   Electronically Signed   By: Charline Bills M.D.   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 Path discussed with pt. Will likely need adjuvant therapy eventually.  2 - Urine Leak - relative outputs now improving overall with higher-pressure vented foley suction, though certainly some up an down. Continue continuous vented suction.   3 - Ileus - Likely from peritoneal irritation from urine leak, improving considerable. NG removed. Begin clears. Greatly appreciate TNA pharmacist help. I feel his current TNA can run out and then hold as long as he tollerates PO.   [facility], Zoii Florer 07/02/2013

## 2013-07-02 NOTE — Progress Notes (Signed)
[id number] Earlene Plater RN, BSN, CCN: [id number] Case management. Chart reviewed for discharge planning and present needs. Discharge needs: none present at time of review. Patient continues to require icu level of care at this time. Next chart review due:  [id number]

## 2013-07-02 NOTE — Progress Notes (Signed)
PARENTERAL NUTRITION CONSULT NOTE - Follow up  Pharmacy Consult for TNA Indication: post-op ileus  Allergies  Allergen Reactions  . Penicillins Rash   Patient Measurements: Height: 5\' 7"  (170.2 cm) Weight: 140 lb 14 oz (63.9 kg) IBW/kg (Calculated) : 66.1 TBW: 63kg  Vital Signs: Temp: 98.2 F (36.8 C) (11/25 0400) Temp src: Oral (11/25 0400) BP: 156/68 mmHg (11/25 0600) Pulse Rate: 66 (11/25 0600) Intake/Output from previous day: 11/24 0701 - 11/25 0700 In: 2034 [I.V.:690; ZOX:0960] Out: 1555 [Urine:1150; Drains:405] Intake/Output from this shift: Total I/O In: -  Out: 100 [Urine:100]  Labs:  Recent Labs  07/01/13 0410  WBC 17.8*  HGB 14.3  HCT 41.5  PLT 263    Recent Labs  06/30/13 0332 07/01/13 0410 07/02/13 0440  NA 135 135 131*  K 4.3 4.1 4.1  CL 102 101 97  CO2 27 28 28   GLUCOSE 124* 132* 125*  BUN 21 21 17   CREATININE 0.85 0.89 0.86  CALCIUM 8.8 9.0 8.6  MG 2.5 2.2 2.0  PHOS 2.3 2.9 3.5  PROT  --  5.7*  --   ALBUMIN  --  2.3*  --   AST  --  22  --   ALT  --  15  --   ALKPHOS  --  54  --   BILITOT  --  0.3  --   PREALBUMIN  --  15.2*  --   TRIG  --  127  --    Estimated Creatinine Clearance: 71.2 ml/min (by C-G formula based on Cr of 0.86).    Recent Labs  07/01/13 2356 07/02/13 0422 07/02/13 0835  GLUCAP 130* 124* 114*   Medications:  Scheduled:  . feeding supplement (RESOURCE BREEZE)  1 Container Oral BID BM  . heparin subcutaneous  5,000 Units Subcutaneous Q8H  . insulin aspart  0-9 Units Subcutaneous TID WC  . metoCLOPramide (REGLAN) injection  10 mg Intravenous Q8H  . metoprolol  5 mg Intravenous Q6H  . pantoprazole (PROTONIX) IV  40 mg Intravenous Q12H  . sodium chloride  10-40 mL Intracatheter Q12H   Infusions:  . dextrose 5 % and 0.45 % NaCl with KCl 20 mEq/L 30 mL/hr at 07/02/13 0626  . Marland KitchenTPN (CLINIMIX-E) Adult 60 mL/hr at 07/01/13 1848   And  . fat emulsion 250 mL (07/01/13 1849)   Insulin Requirements in the  past 24 hours: 3 units  Nutritional Goals:  RD recs: 85-100g of protein and 1750-1950 kCal per day. Clinimix E 5/15 at a goal rate of 70ml/hr + 20% fat emulsion at 45ml/hr to provide: 90g/day protein, 1758Kcal/day.  Current Nutrition:  Clinimix E 5/15 at 67ml/hr + 20% fat emulsion at 6ml/hr.  Clear liquid Resource Breeze  IVF: D5 1/2NS 20KCL at 36ml/hr.  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  Developed post-op ileus, confirmed by CT. 11/23 am began TNA per pharmacy. I/O about even. UOP and NG out yest. NG removed and tolerated 100% of clear liquid breakfast with no nausea. Checked with patient and wife at 11:20 and still denies nausea.   Glucose - 90-130  Electrolytes - Na low, Corr Ca 10.9.  LFTs - wnl  TGs - 127  Prealbumin - 13.8(11/22)  TPN Access: PICC TPN day #: 3  Plan:   As per Dr. Emmaline Life note, stop TPN after current bag finishes at 18:00.   DC routine TNA labs. (Ordered a Bmet for 11/26)  Change SSI sens to  [facility].   Pharmacy will dc follow up. Thank you.  Charolotte Eke, PharmD, pager [phone]. 07/02/2013,11:27 AM.

## 2013-07-03 ENCOUNTER — Encounter: Payer: Self-pay | Admitting: *Deleted

## 2013-07-03 LAB — GLUCOSE, CAPILLARY
Glucose-Capillary: 108 mg/dL — ABNORMAL HIGH (ref 70–99)
Glucose-Capillary: 135 mg/dL — ABNORMAL HIGH (ref 70–99)

## 2013-07-03 LAB — CULTURE, BLOOD (ROUTINE X 2)

## 2013-07-03 LAB — BASIC METABOLIC PANEL
BUN: 15 mg/dL (ref 6–23)
CO2: 25 mEq/L (ref 19–32)
Calcium: 8.8 mg/dL (ref 8.4–10.5)
Chloride: 99 mEq/L (ref 96–112)
Creatinine, Ser: 0.93 mg/dL (ref 0.50–1.35)
GFR calc Af Amer: >90 mL/min (ref >90)
GFR calc non Af Amer: 83 mL/min — ABNORMAL LOW (ref >90)
Potassium: 4.3 mEq/L (ref 3.5–5.1)
Sodium: 132 mEq/L — ABNORMAL LOW (ref 135–145)

## 2013-07-03 NOTE — Progress Notes (Signed)
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 TRUS volume 193gm with large median lobe. Path T3bN0Mx with multifocal positive margins including bladder neck, seminal vesicals.  2 - Urine Leak - Pt with very high JP output and JP Cr 6.8 c/w urine leak. CTcystogram 11/19 confirms small anterior anastomotic leak, JP in good position. Changed to vented foley suction 11/21 and now relative output improving again. Repeat CT 11/24 with improved pelvic fluid.   3 - Ileus - Pt with persistent nausea and occasional emesis post-op. CT cystogram 06/2013 confirms ileus picture, no obstruction / abscess / perforation. NGT placed 11/20 PM with 4l output and immediate symptom relief. RUE PICC placed 11/22 and TPN started 11/23. Tollerated NG clamping with continued large BM and NG removed 11/24 and advanced to regular diet 11/26.  Today Alper is without complaints. Had another good BM last night and tollerated clears w/o nausea / emesis.   Objective: Vital signs in last 24 hours: Temp:  [97 F (36.1 C)-98.8 F (37.1 C)] 98.2 F (36.8 C) (11/26 0400) Pulse Rate:  [64-88] 64 (11/26 0600) Resp:  [11-22] 11 (11/26 0600) BP: (102-157)/(61-105) 102/67 mmHg (11/26 0600) SpO2:  [96 %-100 %] 98 % (11/26 0600) Last BM Date: 07/02/13  Intake/Output from previous day: 11/25 0701 - 11/26 0700 In: 1641.7 [I.V.:723.5; TPN:918.2] Out: 2507 [Urine:2100; Drains:405; Stool:2] Intake/Output this shift: Total I/O In: 330 [I.V.:330] Out: 1226 [Urine:1000; Drains:225; Stool:1]  General appearance: alert, cooperative and appears stated age Head: Normocephalic, without obvious abnormality, atraumatic Eyes: conjunctivae/corneas clear. PERRL, EOM's intact. Fundi benign. Ears: normal TM's and external ear canals both ears Nose: Nares normal. Septum midline.  Mucosa normal. No drainage or sinus tenderness. Throat: lips, mucosa, and tongue normal; teeth and gums normal Neck: no adenopathy, no carotid bruit, no JVD, supple, symmetrical, trachea midline and thyroid not enlarged, symmetric, no tenderness/mass/nodules Back: symmetric, no curvature. ROM normal. No CVA tenderness. Resp: clear to auscultation bilaterally Chest wall: no tenderness Cardio: regular rate and rhythm, S1, S2 normal, no murmur, click, rub or gallop GI: soft, non-tender; bowel sounds normal; no masses,  no organomegaly Male genitalia: normal, foley c/d/i on very light traction. JP with darker uriniferous fluid Extremities: extremities normal, atraumatic, no cyanosis or edema and RUE PICC c/d/i. NO erythema.  Pulses: 2+ and symmetric Skin: Skin color, texture, turgor normal. No rashes or lesions Lymph nodes: Cervical, supraclavicular, and axillary nodes normal. Neurologic: Grossly normal Incision/Wound: Recent port sites c/d/i.   Lab Results:   Recent Labs  07/01/13 0410  WBC 17.8*  HGB 14.3  HCT 41.5  PLT 263   BMET  Recent Labs  07/02/13 0440 07/03/13 0440  NA 131* 132*  K 4.1 4.3  CL 97 99  CO2 28 25  GLUCOSE 125* 113*  BUN 17 15  CREATININE 0.86 0.93  CALCIUM 8.6 8.8   PT/INR No results found for this basename: LABPROT, INR,  in the last 72 hours ABG No results found for this basename: PHART, PCO2, PO2, HCO3,  in the last 72 hours  Studies/Results: Ct Abdomen Pelvis W Contrast  07/01/2013   CLINICAL DATA:  Status post robotic assisted laparoscopic radical prostatectomy on 06/21/2013. Known bladder leak, evaluate for ureteral leak/injury.  EXAM: CT ABDOMEN AND PELVIS WITH CONTRAST  TECHNIQUE: Multidetector CT imaging of the abdomen and pelvis was performed using the standard protocol following  bolus administration of intravenous contrast.  CONTRAST:  OMNIPAQUE IOHEXOL 300 MG/ML  SOLN  COMPARISON:  CT cystogram dated 06/26/2013  FINDINGS: Mild  scarring with atelectasis at the lung bases.  Enteric tube terminates in the gastric body.  Liver, spleen, pancreas, and adrenal glands are within normal limits.  Gallbladder is unremarkable. No intrahepatic or extrahepatic ductal dilatation.  Kidneys are within normal limits. Mild bilateral hydroureteronephrosis.  No evidence of bowel obstruction.  Normal appendix.  Atherosclerotic calcifications of the abdominal aorta and branch vessels.  Thick-walled bladder with indwelling Foley catheter. Multiple layering bladder calculi.  Small volume pelvic ascites (series 5/ image 69), corresponding to known bladder leak, improved. At least one layering calculus is present within the fluid collection (series 2/image 92).  Indwelling right lower quadrant surgical drain.  Delayed imaging through the abdomen/pelvis was performed at 5 minutes, and no leak of extraluminal contrast is visualized. However, the bilateral ureters remain dilated but non-opacified at that time.  Degenerative changes of the visualized thoracolumbar spine. 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  IMPRESSION: No leak of extraluminal contrast is visualized on 5 minute delayed imaging. However, the bilateral ureters remain unopacified at that time.  Mild bilateral hydroureteronephrosis.  Thick-walled bladder with indwelling Foley catheter and multiple layering bladder calculi.  Small volume pelvic ascites, corresponding to known bladder leak, improved.  Indwelling right lower quadrant surgical drain.   Electronically Signed   By: Charline Bills M.D.   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 Path discussed with pt. Will likely need adjuvant therapy eventually.  2 - Urine Leak - relative outputs continue to slowly improvel with vented foley suction, though certainly some up an down. Continue continuous vented suction until daily output less than 100-200cc per JP.   3 - Ileus - Likely from peritoneal irritation from urine leak, improving considerable. Advance to regular diet today.   [facility], Kathaleya Mcduffee 07/03/2013

## 2013-07-03 NOTE — Progress Notes (Signed)
Visited patient at [facility] 1229.  He was sitting in recliner, indicated he was feeling much better than previous past couple of days.  He expects to be hospitalized for a few more days.  Will continue to provide support during this admission.  Young Berry, RN, BSN, [facility] Prostate Oncology Navigator [phone]

## 2013-07-04 LAB — GLUCOSE, CAPILLARY: Glucose-Capillary: 123 mg/dL — ABNORMAL HIGH (ref 70–99)

## 2013-07-04 MED ORDER — PANTOPRAZOLE SODIUM 40 MG PO TBEC
40.0000 mg | DELAYED_RELEASE_TABLET | Freq: Two times a day (BID) | ORAL | Status: DC
  Administered 2013-07-04 – 2013-07-09 (×11): 40 mg via ORAL
  Filled 2013-07-04 (×12): qty 1

## 2013-07-04 NOTE — Progress Notes (Signed)
Pt transferred to 1427. Vented coude catheter in place and hooked to suction. Telemetry intact to box 1427.  Pt alert and oriented to transfer.

## 2013-07-04 NOTE — Progress Notes (Signed)
Report given by Clinical research associate to nurse on 4th floor, Amil Amen.

## 2013-07-04 NOTE — Progress Notes (Signed)
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 TRUS volume 193gm with large median lobe. Path T3bN0Mx with multifocal positive margins including bladder neck, seminal vesicals.  2 - Urine Leak - Pt with very high JP output and JP Cr 6.8 c/w urine leak. CTcystogram 11/19 confirms small anterior anastomotic leak, JP in good position. Changed to vented foley suction 11/21 and now relative output improving again. Repeat CT 11/24 with improved pelvic fluid.  3 - Ileus - Pt with persistent nausea and occasional emesis post-op. CT cystogram 06/2013 confirms ileus picture, no obstruction / abscess / perforation. NGT placed 11/20 PM with 4l output and immediate symptom relief. RUE PICC placed 11/22 and TPN started 11/23. Tollerated NG clamping with continued large BM and NG removed 11/24 and advanced to regular diet 11/26.     Subjective:     No acute urologic events overnight. Ambulation:   negative Flatus:    positive Bowel movement  positive  Pain: some relief  Objective:  Blood pressure 102/60, pulse 72, temperature 98.1 F (36.7 C), temperature source Oral, resp. rate 23, height 5\' 7"  (1.702 m), weight 63.9 kg (140 lb 14 oz), SpO2 98.00%.  Physical Exam:  General:  No acute distress, awake Extremities: extremities normal, atraumatic, no cyanosis or edema Genitourinary:  normal Foley:yes    I/O last 3 completed shifts: In: 1830 [P.O.:780; I.V.:1050] Out: 3046 [Urine:2480; Drains:565; Stool:1]  No results found for this basename: HGB, WBC, PLT,  in the last 72 hours  Recent Labs     07/02/13  0440  07/03/13  0440  NA  131*  132*  K  4.1  4.3  CL  97  99  CO2  28  25  BUN  17  15  CREATININE  0.86  0.93  CALCIUM  8.6  8.8  GFRNONAA  85*  83*  GFRAA  >90  >90     No results found for this basename: PT,  INR, APTT,  in the last 72 hours   No components found with this basename: ABG,   Assessment/Plan: Pt seen by Dr. Katheran James this AM.  For transfer to 4th floor today. Catheter not removed. Continue any current medications.

## 2013-07-04 NOTE — Progress Notes (Signed)
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 TRUS volume 193gm with large median lobe. Path T3bN0Mx with multifocal positive margins including bladder neck, seminal vesicals.  2 - Urine Leak - Pt with very high JP output and JP Cr 6.8 c/w urine leak. CTcystogram 11/19 confirms small anterior anastomotic leak, JP in good position. Changed to vented foley suction 11/21 and now relative output improving again. Repeat CT 11/24 with improved pelvic fluid.   3 - Ileus - Pt with persistent nausea and occasional emesis post-op. CT cystogram 06/2013 confirms ileus picture, no obstruction / abscess / perforation. NGT placed 11/20 PM with 4l output and immediate symptom relief. RUE PICC placed 11/22 and TPN started 11/23. Tollerated NG clamping with continued large BM and NG removed 11/24 and advanced to regular diet 11/26.  Today Keith Bates is feeling stronger. Tolerating regular diet and doing well with ambulation. He had numerous family members visit yesterday.   Objective: Vital signs in last 24 hours: Temp:  [97.5 F (36.4 C)-99 F (37.2 C)] 98.1 F (36.7 C) (11/27 0400) Pulse Rate:  [71-86] 72 (11/27 0400) Resp:  [14-23] 23 (11/27 0400) BP: (102-145)/(56-82) 102/60 mmHg (11/27 0400) SpO2:  [92 %-100 %] 98 % (11/27 0400) Last BM Date: 07/03/13  Intake/Output from previous day: 11/26 0701 - 11/27 0700 In: 1470 [P.O.:780; I.V.:690] Out: 1820 [Urine:1480; Drains:340] Intake/Output this shift:    General appearance: alert, cooperative and appears stated age Head: Normocephalic, without obvious abnormality, atraumatic Eyes: conjunctivae/corneas clear. PERRL, EOM's intact. Fundi benign. Ears: normal TM's and external ear canals both ears Nose: Nares normal. Septum midline. Mucosa normal. No drainage or sinus tenderness. Throat:  lips, mucosa, and tongue normal; teeth and gums normal Neck: no adenopathy, no carotid bruit, no JVD, supple, symmetrical, trachea midline and thyroid not enlarged, symmetric, no tenderness/mass/nodules Back: symmetric, no curvature. ROM normal. No CVA tenderness. Resp: clear to auscultation bilaterally Chest wall: no tenderness Cardio: regular rate and rhythm, S1, S2 normal, no murmur, click, rub or gallop GI: soft, non-tender; bowel sounds normal; no masses,  no organomegaly Male genitalia: normal, foley c/d/i on very light traction with clear urine in vented suction container with vents working well. JP with uriniferous fluid.  Extremities: extremities normal, atraumatic, no cyanosis or edema and RUE PICC c/d/i on KVO. Pulses: 2+ and symmetric Skin: Skin color, texture, turgor normal. No rashes or lesions Lymph nodes: Cervical, supraclavicular, and axillary nodes normal. Neurologic: Grossly normal Incision/Wound: Recent port and extraction sites c/d/i.   Lab Results:  No results found for this basename: WBC, HGB, HCT, PLT,  in the last 72 hours BMET  Recent Labs  07/02/13 0440 07/03/13 0440  NA 131* 132*  K 4.1 4.3  CL 97 99  CO2 28 25  GLUCOSE 125* 113*  BUN 17 15  CREATININE 0.86 0.93  CALCIUM 8.6 8.8   PT/INR No results found for this basename: LABPROT, INR,  in the last 72 hours ABG No results found for this basename: PHART, PCO2, PO2, HCO3,  in the last 72 hours  Studies/Results: No results found.  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 Path discussed with pt. Will likely need adjuvant therapy eventually.  2 - Urine Leak - relative outputs continue to slowly improvel with vented foley suction, though certainly some up an down. Continue continuous vented suction until daily output less than 100-200cc per JP.   3 - Ileus - Likely from peritoneal irritation from urine leak, now essentially resolved.  4 - Transfer to floor. Re-iterated importance of vented suction and light foley traction to NSG for hand-off.   [facility], Kaiser Belluomini 07/04/2013

## 2013-07-04 NOTE — Progress Notes (Signed)
The patient is receiving Protonix by the intravenous route.  Based on criteria approved by the Pharmacy and Therapeutics Committee and the Medical Executive Committee, the medication is being converted to the equivalent oral dose form.  These criteria include: -No Active GI bleeding -Able to tolerate diet of full liquids (or better) or tube feeding -Able to tolerate other medications by the oral or enteral route  If you have any questions about this conversion, please contact the Pharmacy Department (phone [phone]).  Thank you.    Aviv Rota, Loma Messing PharmD Pager #: [phone] 9:59 AM 07/04/2013

## 2013-07-04 NOTE — Progress Notes (Signed)
Patient had 8 beats of v.tach, asymptomatic and resting in bed. Vital signs stable. Will continue to monitor patient.

## 2013-07-05 LAB — GLUCOSE, CAPILLARY
Glucose-Capillary: 120 mg/dL — ABNORMAL HIGH (ref 70–99)
Glucose-Capillary: 130 mg/dL — ABNORMAL HIGH (ref 70–99)
Glucose-Capillary: 135 mg/dL — ABNORMAL HIGH (ref 70–99)

## 2013-07-05 MED ORDER — ENSURE COMPLETE PO LIQD
237.0000 mL | ORAL | Status: DC
  Administered 2013-07-06 – 2013-07-08 (×3): 237 mL via ORAL

## 2013-07-05 MED ORDER — BOOST / RESOURCE BREEZE PO LIQD
1.0000 | ORAL | Status: DC
  Administered 2013-07-06 – 2013-07-09 (×4): 1 via ORAL

## 2013-07-05 NOTE — Progress Notes (Signed)
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 TRUS volume 193gm with large median lobe. Path T3bN0Mx with multifocal positive margins including bladder neck, seminal vesicals.  2 - Urine Leak - Pt with very high JP output and JP Cr 6.8 c/w urine leak. CTcystogram 11/19 confirms small anterior anastomotic leak, JP in good position. Changed to vented foley suction 11/21 and now relative output improving again. Repeat CT 11/24 with improved pelvic fluid.   3 - Ileus - Pt with persistent nausea and occasional emesis post-op. CT cystogram 06/2013 confirms ileus picture, no obstruction / abscess / perforation. NGT placed 11/20 PM with 4l output and immediate symptom relief. RUE PICC placed 11/22 and TPN started 11/23. Tollerated NG clamping with continued large BM and NG removed 11/24 and advanced to regular diet 11/26.  Today Keith Bates is back on the med-surg unit, no complaints. JP output continues to fall.   Objective: Vital signs in last 24 hours: Temp:  [97.7 F (36.5 C)-99.5 F (37.5 C)] 99.4 F (37.4 C) (11/28 0514) Pulse Rate:  [85-94] 87 (11/28 0514) Resp:  [16-18] 16 (11/28 0514) BP: (113-136)/(65-74) 136/68 mmHg (11/28 0514) SpO2:  [98 %-100 %] 98 % (11/28 0514) Weight:  [63.9 kg (140 lb 14 oz)] 63.9 kg (140 lb 14 oz) (11/27 1146) Last BM Date: 07/03/13  Intake/Output from previous day: 11/27 0701 - 11/28 0700 In: 990 [P.O.:480; I.V.:510] Out: 1425 [Urine:1300; Drains:125] Intake/Output this shift: Total I/O In: 390 [P.O.:240; I.V.:150] Out: 500 [Urine:500]  General appearance: alert, cooperative and appears stated age Head: Normocephalic, without obvious abnormality, atraumatic Eyes: conjunctivae/corneas clear. PERRL, EOM's intact. Fundi benign. Ears: normal TM's and external ear canals both ears Nose: Nares  normal. Septum midline. Mucosa normal. No drainage or sinus tenderness. Throat: lips, mucosa, and tongue normal; teeth and gums normal Neck: no adenopathy, no carotid bruit, no JVD, supple, symmetrical, trachea midline and thyroid not enlarged, symmetric, no tenderness/mass/nodules Back: symmetric, no curvature. ROM normal. No CVA tenderness. Resp: clear to auscultation bilaterally Chest wall: no tenderness Cardio: regular rate and rhythm, S1, S2 normal, no murmur, click, rub or gallop GI: soft, non-tender; bowel sounds normal; no masses,  no organomegaly Male genitalia: normal, foley on light traction wo vented suction, vents working well. JP output wiht minimal uriniferous fluid Extremities: extremities normal, atraumatic, no cyanosis or edema Pulses: 2+ and symmetric Skin: Skin color, texture, turgor normal. No rashes or lesions Lymph nodes: Cervical, supraclavicular, and axillary nodes normal. Neurologic: Grossly normal Incision/Wound: Recent port sites / extraction sites c/d/i.  Lab Results:  No results found for this basename: WBC, HGB, HCT, PLT,  in the last 72 hours BMET  Recent Labs  07/03/13 0440  NA 132*  K 4.3  CL 99  CO2 25  GLUCOSE 113*  BUN 15  CREATININE 0.93  CALCIUM 8.8   PT/INR No results found for this basename: LABPROT, INR,  in the last 72 hours ABG No results found for this basename: PHART, PCO2, PO2, HCO3,  in the last 72 hours  Studies/Results: No results found.  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 Path discussed with pt. Will likely need adjuvant therapy eventually.  2 - Urine Leak - relative outputs continue to slowly improvel with vented foley suction, though certainly some up an down. Continue continuous vented suction until daily output less than 100-200cc per JP. With current trend, this will likely be tomorrow.  3 - Ileus - Likely from peritoneal irritation from urine leak, now essentially resolved.  4 - Ambulate, out of bed. Vented suction can certainly be off when ambulatory.   [facility], Keith Bates 07/05/2013

## 2013-07-05 NOTE — Progress Notes (Addendum)
NUTRITION FOLLOW UP  Intervention:   Resource Breeze once daily Ensure Complete once daily Encouraged continued good intake of meals and supplements.  Nutrition Dx:   Inadequate oral intake related to emesis as evidenced by 10% intake of breakfast; ongoing- varied PO intake 25-100%, clear liquids today- resolved.  New Nutrition Dx:   Increased nutrient needs related to illness and post op0 AEB protein needs >80 gm daily.  Goal:   Pt to meet >/= 90% of their estimated nutrition needs- met.  Monitor:   Diet advancement PO intake; varied 25-100% Weight; 3 lb weight increase from 11/10 to 11/20 Labs; low sodium, high BUN, low GFR  Assessment:   Patient with history of high risk prostate cancer, admitted for radial prostatectomy. Pt had robotic radical prostatectomy with bilateral pelvic lymphadenectomy 06/21/2013.   Patient with post op ileus, NG tube and TNA now d/ced.  Patient on regular diet with very good intake.  Resource BID which patient likes.  Wants to try Ensure.  Last BM today. Height: Ht Readings from Last 1 Encounters:  07/04/13 5\' 7"  (1.702 m)    Weight Status:   Wt Readings from Last 1 Encounters:  07/04/13 140 lb 14 oz (63.9 kg)    Re-estimated needs:  Kcal: 1750-1950 kcal  Protein: 85-100 g  Fluid: 1.8-2 L/day  Skin: abdominal incisions  Diet Order: GeneralNPO   Intake/Output Summary (Last 24 hours) at 07/05/13 1113 Last data filed at 07/05/13 0825  Gross per 24 hour  Intake   1440 ml  Output   1975 ml  Net   -535 ml    Last BM: 11/28   Labs:   Recent Labs Lab 06/30/13 0332 07/01/13 0410 07/02/13 0440 07/03/13 0440  NA 135 135 131* 132*  K 4.3 4.1 4.1 4.3  CL 102 101 97 99  CO2 27 28 28 25   BUN 21 21 17 15   CREATININE 0.85 0.89 0.86 0.93  CALCIUM 8.8 9.0 8.6 8.8  MG 2.5 2.2 2.0  --   PHOS 2.3 2.9 3.5  --   GLUCOSE 124* 132* 125* 113*    CBG (last 3)   Recent Labs  07/04/13 1138 07/04/13 1636 07/05/13 0720  GLUCAP 123*  101* 120*    Scheduled Meds: . feeding supplement (RESOURCE BREEZE)  1 Container Oral BID BM  . heparin subcutaneous  5,000 Units Subcutaneous Q8H  . insulin aspart  0-9 Units Subcutaneous TID WC  . metoCLOPramide (REGLAN) injection  10 mg Intravenous Q8H  . metoprolol  5 mg Intravenous Q6H  . pantoprazole  40 mg Oral BID  . sodium chloride  10-40 mL Intracatheter Q12H    Continuous Infusions: . dextrose 5 % and 0.45 % NaCl with KCl 20 mEq/L 30 mL/hr at 07/04/13 2152    Oran Rein, RD, LDN Clinical Inpatient Dietitian Pager:  [phone] Weekend and after hours pager:  [phone]

## 2013-07-06 LAB — GLUCOSE, CAPILLARY
Glucose-Capillary: 136 mg/dL — ABNORMAL HIGH (ref 70–99)
Glucose-Capillary: 140 mg/dL — ABNORMAL HIGH (ref 70–99)

## 2013-07-06 NOTE — Progress Notes (Signed)
Urology Progress Note  Subjective:     No acute urologic events overnight. Negative nausea or emesis. Tolerating regular diet. Positive flatus today. Last BM was last night.  ROS: Negative: chest pain or SOB.  Objective:  Patient Vitals for the past 24 hrs:  BP Temp Temp src Pulse Resp SpO2  07/06/13 0459 122/77 mmHg 98.5 F (36.9 C) Oral 100 16 97 %  07/05/13 2337 132/75 mmHg - - 107 - -  07/05/13 2102 142/72 mmHg 98.5 F (36.9 C) Oral 97 18 99 %  07/05/13 1400 164/68 mmHg 99 F (37.2 C) Oral 93 18 97 %    Physical Exam: General:  No acute distress, awake Cardiovascular:    [x]   S1/S2 present, RRR  []   Irregularly irregular Chest:  CTA-B Abdomen:               []  Soft, appropriately TTP  []  Soft, NTTP  [x]  Soft, non- TTP, incision(s) clean/dry/intact, JP serosanguinous.  Genitourinary: Foley clear yellow on vented low intermittent suction. Vents checked and still working.     I/O last 3 completed shifts: In: 1693.5 [P.O.:600; I.V.:1093.5] Out: 2110 [Urine:2025; Drains:85]  No results found for this basename: HGB, WBC, PLT,  in the last 72 hours  No results found for this basename: NA, K, CL, CO2, BUN, CREATININE, CALCIUM, MAGNESIUM, GFRNONAA, GFRAA,  in the last 72 hours   No results found for this basename: PT, INR, APTT,  in the last 72 hours   No components found with this basename: ABG,     Length of stay: 15 days.  Assessment: Prostate cancer. Ileus. Urine leak. POD#15 Robotic radical prostatectomy.   Plan: -JP drain output decreasing. -Will continue foley to vented suction today as JP continues to drop and if remains on course will place back to gravity tomorrow. -Continue regular diet. -Continue prophylaxis w/ Incentive spirometry and heparin.   Natalia Leatherwood, MD [phone]

## 2013-07-07 LAB — GLUCOSE, CAPILLARY: Glucose-Capillary: 178 mg/dL — ABNORMAL HIGH (ref 70–99)

## 2013-07-07 NOTE — Progress Notes (Signed)
Urology Progress Note  Subjective:     No acute urologic events overnight. Eating food well. Positive flatus. Negative nausea. JP output not recorded for day shift yesterday, but the output over night was so low it was unmeasurible.  ROS: Negative: chest pain or SOB.  Objective:  Patient Vitals for the past 24 hrs:  BP Temp Temp src Pulse Resp SpO2  07/07/13 0545 119/80 mmHg 98.5 F (36.9 C) Oral - 20 98 %  07/06/13 2200 124/66 mmHg 98.1 F (36.7 C) Oral 83 20 98 %  07/06/13 1300 117/58 mmHg 98.8 F (37.1 C) Oral 99 18 98 %    Physical Exam: General:  No acute distress, awake Cardiovascular:    [x]   S1/S2 present, RRR  []   Irregularly irregular Chest:  CTA-B Abdomen:               []  Soft, appropriately TTP  []  Soft, NTTP  [x]  Soft, non- TTP, incision(s) clean/dry/intact, JP serosanguinous.  Genitourinary: Foley clear yellow on vented low intermittent suction. Vents checked and still working.     I/O last 3 completed shifts: In: 1560 [P.O.:840; I.V.:720] Out: 1760 [Urine:1750; Drains:10]  No results found for this basename: HGB, WBC, PLT,  in the last 72 hours  No results found for this basename: NA, K, CL, CO2, BUN, CREATININE, CALCIUM, MAGNESIUM, GFRNONAA, GFRAA,  in the last 72 hours   No results found for this basename: PT, INR, APTT,  in the last 72 hours   No components found with this basename: ABG,     Length of stay: 16 days.  Assessment: Prostate cancer. Ileus. Urine leak. POD#16 Robotic radical prostatectomy.   Plan: -Change foley to dependent drainage. -Accurate I's/O's needed. -Continue regular diet. -Continue prophylaxis w/ Incentive spirometry and heparin.   Natalia Leatherwood, MD [phone]

## 2013-07-08 LAB — GENTAMICIN LEVEL, RANDOM: Gentamicin Rm: 1.7 ug/mL

## 2013-07-08 LAB — GLUCOSE, CAPILLARY
Glucose-Capillary: 122 mg/dL — ABNORMAL HIGH (ref 70–99)
Glucose-Capillary: 133 mg/dL — ABNORMAL HIGH (ref 70–99)

## 2013-07-08 LAB — CREATININE, FLUID (PLEURAL, PERITONEAL, JP DRAINAGE)

## 2013-07-08 MED ORDER — FLUCONAZOLE 100MG IVPB
100.0000 mg | INTRAVENOUS | Status: AC
  Administered 2013-07-08 – 2013-07-09 (×2): 100 mg via INTRAVENOUS
  Filled 2013-07-08 (×2): qty 50

## 2013-07-08 MED ORDER — GENTAMICIN SULFATE 40 MG/ML IJ SOLN
7.0000 mg/kg | INTRAMUSCULAR | Status: DC
  Administered 2013-07-08: 450 mg via INTRAVENOUS
  Filled 2013-07-08 (×2): qty 11.25

## 2013-07-08 NOTE — Progress Notes (Signed)
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 TRUS volume 193gm with large median lobe. Path T3bN0Mx with multifocal positive margins including bladder neck, seminal vesicals.  2 - Urine Leak - Pt with very high JP output and JP Cr 6.8 c/w urine leak. CTcystogram 11/19 confirms small anterior anastomotic leak, JP in good position. Changed to vented foley suction 11/21 and now relative output improving again. Repeat CT 11/24 with improved pelvic fluid. Foley changed to gravity 11/30 without increase in JP output.   3 - Ileus - Pt with persistent nausea and occasional emesis post-op. CT cystogram 06/2013 confirms ileus picture, no obstruction / abscess / perforation. NGT placed 11/20 PM with 4l output and immediate symptom relief. RUE PICC placed 11/22 and TPN started 11/23. Tollerated NG clamping with continued large BM and NG removed 11/24 and advanced to regular diet 11/26.  Today Keith Bates is without complaints. Had one fever last PM and urine in foley cloudy this AM.   Objective: Vital signs in last 24 hours: Temp:  [98.1 F (36.7 C)-101.3 F (38.5 C)] 98.1 F (36.7 C) (12/01 0534) Pulse Rate:  [99-114] 99 (12/01 0534) Resp:  [18-20] 18 (12/01 0534) BP: (116-140)/(59-68) 128/65 mmHg (12/01 0534) SpO2:  [98 %-99 %] 99 % (12/01 0534) Last BM Date: 07/05/13  Intake/Output from previous day: 11/30 0701 - 12/01 0700 In: 1605 [P.O.:960; I.V.:645] Out: 1835 [Urine:1830; Drains:5] Intake/Output this shift:    General appearance: alert, cooperative and appears stated age Head: Normocephalic, without obvious abnormality, atraumatic Eyes: conjunctivae/corneas clear. PERRL, EOM's intact. Fundi benign. Ears: normal TM's and external ear canals both ears Nose: Nares normal. Septum midline. Mucosa normal. No drainage or sinus  tenderness. Throat: lips, mucosa, and tongue normal; teeth and gums normal Neck: no adenopathy, no carotid bruit, no JVD, supple, symmetrical, trachea midline and thyroid not enlarged, symmetric, no tenderness/mass/nodules Back: symmetric, no curvature. ROM normal. No CVA tenderness. Resp: clear to auscultation bilaterally Chest wall: no tenderness Cardio: regular rate and rhythm, S1, S2 normal, no murmur, click, rub or gallop GI: soft, non-tender; bowel sounds normal; no masses,  no organomegaly Male genitalia: normal, foley c/d/i with cloudy urine. JP site wtih minimal serous output. Extremities: extremities normal, atraumatic, no cyanosis or edema and RUE PICC site c/d/i. No erythema. Pulses: 2+ and symmetric Skin: Skin color, texture, turgor normal. No rashes or lesions Lymph nodes: Cervical, supraclavicular, and axillary nodes normal. Neurologic: Grossly normal Incision/Wound: Recent port and extraction sites c/d/i.   Lab Results:  No results found for this basename: WBC, HGB, HCT, PLT,  in the last 72 hours BMET No results found for this basename: NA, K, CL, CO2, GLUCOSE, BUN, CREATININE, CALCIUM,  in the last 72 hours PT/INR No results found for this basename: LABPROT, INR,  in the last 72 hours ABG No results found for this basename: PHART, PCO2, PO2, HCO3,  in the last 72 hours  Studies/Results: No results found.  Anti-infectives: Anti-infectives   Start     Dose/Rate Route Frequency Ordered Stop   07/08/13 0800  fluconazole (DIFLUCAN) IVPB 100 mg     100 mg 50 mL/hr over 60 Minutes Intravenous Every 24 hours 07/08/13 0753 07/10/13 0759   06/21/13 0930  clindamycin (CLEOCIN) IVPB 900 mg     900 mg 100 mL/hr over 30 Minutes Intravenous  Once 06/21/13 0921 06/21/13 1150  06/21/13 0921  ciprofloxacin (CIPRO) IVPB 400 mg  Status:  Discontinued     400 mg 200 mL/hr over 60 Minutes Intravenous Every 12 hours 06/21/13 0921 06/21/13 1753   06/21/13 0000  ciprofloxacin  (CIPRO) 500 MG tablet     500 mg Oral 2 times daily 06/21/13 1628        Assessment/Plan:  1 - 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 Path discussed with pt. Will likely need adjuvant therapy eventually.  2 - Urine Leak - relative outputs continue to improve with foley now on gravity drain only. This is very encouraging.   3 - Ileus - Likely from peritoneal irritation from urine leak, now essentially resolved.  4 - UCX and gent/diflucan for presuemed UTI, if fever persists will BCX. LIkely DC in next day or so if continued to do well on gravity-drain foley and remains afebrile.   [facility], Akhila Mahnken 07/08/2013

## 2013-07-08 NOTE — Progress Notes (Signed)
ANTIBIOTIC CONSULT NOTE - INITIAL  Pharmacy Consult for Extended Interval Gentamicin Indication: UTI  Allergies  Allergen Reactions  . Penicillins Rash    Patient Measurements: Height: 5\' 7"  (170.2 cm) Weight: 140 lb 14 oz (63.9 kg) IBW/kg (Calculated) : 66.1   Vital Signs: Temp: 98.1 F (36.7 C) (12/01 0534) Temp src: Oral (12/01 0534) BP: 128/65 mmHg (12/01 0534) Pulse Rate: 99 (12/01 0534) Intake/Output from previous day: 11/30 0701 - 12/01 0700 In: 1605 [P.O.:960; I.V.:645] Out: 1835 [Urine:1830; Drains:5] Intake/Output from this shift: Total I/O In: 240 [P.O.:240] Out: -   Labs: No results found for this basename: WBC, HGB, PLT, LABCREA, CREATININE,  in the last 72 hours Estimated Creatinine Clearance: 65.8 ml/min (by C-G formula based on Cr of 0.93). No results found for this basename: VANCOTROUGH, Leodis Binet, VANCORANDOM, GENTTROUGH, GENTPEAK, GENTRANDOM, TOBRATROUGH, TOBRAPEAK, TOBRARND, AMIKACINPEAK, AMIKACINTROU, AMIKACIN,  in the last 72 hours   Microbiology: Recent Results (from the past 720 hour(s))  URINE CULTURE     Status: None   Collection Time    06/24/13  9:29 AM      Result Value Range Status   Specimen Description URINE, CATHETERIZED   Final   Special Requests NONE   Final   Culture  Setup Time     Final   Value: 06/24/2013 13:54     Performed at [facility]   Culture     Final   Value: NO GROWTH     Performed at [facility]   Report Status 06/25/2013 FINAL   Final  CULTURE, BLOOD (ROUTINE X 2)     Status: None   Collection Time    06/27/13  2:00 AM      Result Value Range Status   Specimen Description BLOOD RIGHT ANTECUBITAL   Final   Special Requests BOTTLES DRAWN AEROBIC AND ANAEROBIC 3CC   Final   Culture  Setup Time     Final   Value: 06/27/2013 08:59     Performed at [facility]   Culture     Final   Value: NO GROWTH 5 DAYS     Performed at [facility]   Report Status 07/03/2013 FINAL   Final   CULTURE, BLOOD (ROUTINE X 2)     Status: None   Collection Time    06/27/13  2:10 AM      Result Value Range Status   Specimen Description BLOOD LEFT HAND   Final   Special Requests BOTTLES DRAWN AEROBIC AND ANAEROBIC 5CC   Final   Culture  Setup Time     Final   Value: 06/27/2013 08:57     Performed at [facility]   Culture     Final   Value: NO GROWTH 5 DAYS     Performed at [facility]   Report Status 07/03/2013 FINAL   Final  MRSA PCR SCREENING     Status: None   Collection Time    06/27/13  3:46 AM      Result Value Range Status   MRSA by PCR NEGATIVE  NEGATIVE Final   Comment:            The GeneXpert MRSA Assay (FDA     approved for NASAL specimens     only), is one component of a     comprehensive MRSA colonization     surveillance program. It is not     intended to diagnose MRSA     infection nor  to guide or     monitor treatment for     MRSA infections.  URINE CULTURE     Status: None   Collection Time    06/27/13  8:30 AM      Result Value Range Status   Specimen Description URINE, CLEAN CATCH   Final   Special Requests NONE   Final   Culture  Setup Time     Final   Value: 06/27/2013 14:34     Performed at [facility] Count     Final   Value: NO GROWTH     Performed at [facility]   Culture     Final   Value: NO GROWTH     Performed at [facility]   Report Status 06/28/2013 FINAL   Final    Medical History: Past Medical History  Diagnosis Date  . Enlarged prostate   . Prostate cancer   . Unspecified hemorrhoids without mention of complication   . Loss of weight   . GERD (gastroesophageal reflux disease)   . Environmental allergies   . Arthritis   . Hx of ulcer disease   . Nocturia   . DDD (degenerative disc disease)    Assessment: 26 yoM with high risk prostate cancer s/p radical prostatectomy 11/14, also with urine leak improving with foley. Overnight pt had one fever and urine in foley is  cloudy this morning.  Pharmacy consulted to dose extended interval gentamicin for UTI.  Noted if patient remains afebrile, may d/c in the next day.    Wt = 63.9kg (less than IBW) Renal function:  SCr 0.93 on 11/26  WBC: 17.9 (11/24) Tm24h: 101.3  Microbiology: 11/20 Urine: NGF 11/20 Blood: NGF  Goal of Therapy:  Gentamicin trough level <2 mcg/ml  Plan:  1.  Gentamicin 7mg /kg = 450mg  IV q 24 hours.   2.  Check 10 hour gentamicin level.   3.  BMET in AM.    Haynes Hoehn, PharmD 07/08/2013, 8:14 AM  Pager: [phone]

## 2013-07-09 LAB — BASIC METABOLIC PANEL
BUN: 10 mg/dL (ref 6–23)
Chloride: 99 mEq/L (ref 96–112)
GFR calc Af Amer: >90 mL/min (ref >90)
Potassium: 3.4 mEq/L — ABNORMAL LOW (ref 3.5–5.1)
Sodium: 134 mEq/L — ABNORMAL LOW (ref 135–145)

## 2013-07-09 LAB — GLUCOSE, CAPILLARY
Glucose-Capillary: 113 mg/dL — ABNORMAL HIGH (ref 70–99)
Glucose-Capillary: 126 mg/dL — ABNORMAL HIGH (ref 70–99)

## 2013-07-09 MED ORDER — SENNOSIDES-DOCUSATE SODIUM 8.6-50 MG PO TABS: 1.0000 | ORAL_TABLET | Freq: Two times a day (BID) | ORAL | Status: DC

## 2013-07-09 MED ORDER — SULFAMETHOXAZOLE-TMP DS 800-160 MG PO TABS: 1.0000 | ORAL_TABLET | Freq: Two times a day (BID) | ORAL | Status: DC

## 2013-07-09 NOTE — Progress Notes (Signed)
ANTIBIOTIC CONSULT NOTE - FOLLOW UP  Pharmacy Consult for Extended interval gentamicin Indication: UTI  Allergies  Allergen Reactions  . Penicillins Rash    Patient Measurements: Height: 5\' 7"  (170.2 cm) Weight: 140 lb 14 oz (63.9 kg) IBW/kg (Calculated) : 66.1 Adjusted Body Weight:   Vital Signs: Temp: 99.7 F (37.6 C) (12/01 2114) Temp src: Oral (12/01 2114) BP: 135/61 mmHg (12/01 2114) Pulse Rate: 104 (12/01 2114) Intake/Output from previous day: 12/01 0701 - 12/02 0700 In: 490 [P.O.:480; I.V.:10] Out: 1232 [Urine:1225; Drains:7] Intake/Output from this shift: Total I/O In: -  Out: 325 [Urine:325]  Labs: No results found for this basename: WBC, HGB, PLT, LABCREA, CREATININE,  in the last 72 hours Estimated Creatinine Clearance: 65.8 ml/min (by C-G formula based on Cr of 0.93).  Recent Labs  07/08/13 2145  GENTRANDOM 1.7     Microbiology: Recent Results (from the past 720 hour(s))  URINE CULTURE     Status: None   Collection Time    06/24/13  9:29 AM      Result Value Range Status   Specimen Description URINE, CATHETERIZED   Final   Special Requests NONE   Final   Culture  Setup Time     Final   Value: 06/24/2013 13:54     Performed at [facility]   Culture     Final   Value: NO GROWTH     Performed at [facility]   Report Status 06/25/2013 FINAL   Final  CULTURE, BLOOD (ROUTINE X 2)     Status: None   Collection Time    06/27/13  2:00 AM      Result Value Range Status   Specimen Description BLOOD RIGHT ANTECUBITAL   Final   Special Requests BOTTLES DRAWN AEROBIC AND ANAEROBIC 3CC   Final   Culture  Setup Time     Final   Value: 06/27/2013 08:59     Performed at [facility]   Culture     Final   Value: NO GROWTH 5 DAYS     Performed at [facility]   Report Status 07/03/2013 FINAL   Final  CULTURE, BLOOD (ROUTINE X 2)     Status: None   Collection Time    06/27/13  2:10 AM      Result Value Range Status   Specimen Description BLOOD LEFT HAND   Final   Special Requests BOTTLES DRAWN AEROBIC AND ANAEROBIC 5CC   Final   Culture  Setup Time     Final   Value: 06/27/2013 08:57     Performed at [facility]   Culture     Final   Value: NO GROWTH 5 DAYS     Performed at [facility]   Report Status 07/03/2013 FINAL   Final  MRSA PCR SCREENING     Status: None   Collection Time    06/27/13  3:46 AM      Result Value Range Status   MRSA by PCR NEGATIVE  NEGATIVE Final   Comment:            The GeneXpert MRSA Assay (FDA     approved for NASAL specimens     only), is one component of a     comprehensive MRSA colonization     surveillance program. It is not     intended to diagnose MRSA     infection nor to guide or     monitor treatment for  MRSA infections.  URINE CULTURE     Status: None   Collection Time    06/27/13  8:30 AM      Result Value Range Status   Specimen Description URINE, CLEAN CATCH   Final   Special Requests NONE   Final   Culture  Setup Time     Final   Value: 06/27/2013 14:34     Performed at [facility] Count     Final   Value: NO GROWTH     Performed at [facility]   Culture     Final   Value: NO GROWTH     Performed at [facility]   Report Status 06/28/2013 FINAL   Final    Anti-infectives   Start     Dose/Rate Route Frequency Ordered Stop   07/08/13 0900  gentamicin (GARAMYCIN) 450 mg in dextrose 5 % 100 mL IVPB     7 mg/kg  63.9 kg 111.3 mL/hr over 60 Minutes Intravenous Every 24 hours 07/08/13 0815     07/08/13 0800  fluconazole (DIFLUCAN) IVPB 100 mg     100 mg 50 mL/hr over 60 Minutes Intravenous Every 24 hours 07/08/13 0753 07/10/13 0759   06/21/13 0930  clindamycin (CLEOCIN) IVPB 900 mg     900 mg 100 mL/hr over 30 Minutes Intravenous  Once 06/21/13 0921 06/21/13 1150   06/21/13 0921  ciprofloxacin (CIPRO) IVPB 400 mg  Status:  Discontinued     400 mg 200 mL/hr over 60 Minutes Intravenous  Every 12 hours 06/21/13 0921 06/21/13 1753   06/21/13 0000  ciprofloxacin (CIPRO) 500 MG tablet     500 mg Oral 2 times daily 06/21/13 1628        Assessment: Patient with 10hr gentamicin level at goal for q24hr dosing.  Goal of Therapy:  [place] nomogram dosing  Plan:  Follow up culture results Continue gentamicin q24hr dosing  Aleene Davidson Crowford 07/09/2013,2:27 AM

## 2013-07-09 NOTE — Discharge Summary (Signed)
Physician Discharge Summary  Patient ID: Keith Bates MRN: [id number] DOB/AGE: 71-Aug-1942 71 y.o.  Admit date: 06/21/2013 Discharge date: 07/09/2013  Admission Diagnoses: Prostate Cancer  Discharge Diagnoses: Prostate Cancer, urine Leak, Ileus Active Problems:   * No active hospital problems. 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 TRUS volume 193gm with large median lobe. Path T3bN0Mx with multifocal positive margins including bladder neck, seminal vesicals.   2 - Urine Leak - Pt with very high JP output and JP Cr 6.8 c/w urine leak. CTcystogram 11/19 confirms small anterior anastomotic leak, JP in good position. Changed to vented foley suction 11/21 and now relative output improving again. Repeat CT 11/24 with improved pelvic fluid. Foley changed to gravity 11/30 without increase in JP output. By time of discharge JP puttin gout <10cc per day but fluid Cr still c/w some urine in it.   3 - Ileus - Pt with persistent nausea and occasional emesis post-op. CT cystogram 06/2013 confirms ileus picture, no obstruction / abscess / perforation. NGT placed 11/20 PM with 4l output and immediate symptom relief. RUE PICC placed 11/22 and TPN started 11/23. Tollerated NG clamping with continued large BM and NG removed 11/24 and advanced to regular diet 11/26. At discharge he has been tolerating regular diet for several days.   Consults: TPN pharmacy  Significant Diagnostic Studies: labs: pathology and CT scans as per above.  Treatments: IV hydration, surgery: radical prostatectomy with bilateral pelvic lymphadenectomy 06/21/2013  and  IN nutrition.  Discharge Exam: Blood pressure 130/75, pulse 99, temperature 98.4 F (36.9 C), temperature source Oral, resp. rate 16, height 5\' 7"  (1.702 m), weight 63.9 kg  (140 lb 14 oz), SpO2 99.00%. General appearance: alert, cooperative and appears stated age Head: Normocephalic, without obvious abnormality, atraumatic Eyes: conjunctivae/corneas clear. PERRL, EOM's intact. Fundi benign. Ears: normal TM's and external ear canals both ears Nose: Nares normal. Septum midline. Mucosa normal. No drainage or sinus tenderness. Throat: lips, mucosa, and tongue normal; teeth and gums normal Neck: no adenopathy, no carotid bruit, no JVD, supple, symmetrical, trachea midline and thyroid not enlarged, symmetric, no tenderness/mass/nodules Back: symmetric, no curvature. ROM normal. No CVA tenderness. Resp: clear to auscultation bilaterally Chest wall: no tenderness Cardio: regular rate and rhythm, S1, S2 normal, no murmur, click, rub or gallop GI: soft, non-tender; bowel sounds normal; no masses,  no organomegaly Male genitalia: normal, foley c/d/i with clear urine. JP with scant output.  Extremities: extremities normal, atraumatic, no cyanosis or edema and RUE PICC c/d/i, to be removed priro to discharge Pulses: 2+ and symmetric Skin: Skin color, texture, turgor normal. No rashes or lesions Lymph nodes: Cervical, supraclavicular, and axillary nodes normal. Neurologic: Grossly normal Incision/Wound: Recent port sites / extraction sites c/d/i.   Disposition: Final discharge disposition not confirmed     Medication List         ciprofloxacin 500 MG tablet  Commonly known as:  CIPRO  Take 1 tablet (500 mg total) by mouth 2 (two) times daily. Start day prior to office visit for foley removal     fluticasone 50 MCG/ACT nasal spray  Commonly known as:  FLONASE  Place 2 sprays into the nose daily.     HYDROcodone-acetaminophen 5-325 MG per tablet  Commonly known as:  NORCO  Take 1-2 tablets by mouth every 6 (six) hours  as needed.     loratadine 10 MG tablet  Commonly known as:  CLARITIN  Take 10 mg by mouth daily.     omeprazole 20 MG capsule  Commonly  known as:  PRILOSEC  Take 20 mg by mouth daily.     senna-docusate 8.6-50 MG per tablet  Commonly known as:  Senokot-S  Take 1 tablet by mouth 2 (two) times daily. While taking pain meds to prevent constipation     sulfamethoxazole-trimethoprim 800-160 MG per tablet  Commonly known as:  BACTRIM DS  Take 1 tablet by mouth 2 (two) times daily. To prevent infection           Follow-up Information   Follow up with Sebastian Ache, MD. (we will call you to arrange office visit for X-Ray and MD visit late next week.)    Specialty:  Urology   Contact information:   [address] [place] [address] [phone]       Signed: Sebastian Ache 07/09/2013, 8:02 AM

## 2013-07-11 LAB — URINE CULTURE: Colony Count: >=100000

## 2013-07-23 ENCOUNTER — Inpatient Hospital Stay (HOSPITAL_COMMUNITY)
Admission: EM | Admit: 2013-07-23 | Discharge: 2013-08-01 | DRG: 389 | Disposition: A | Discharge status: Home / Self Care | Payer: Medicare Other | Attending: Internal Medicine | Admitting: Internal Medicine

## 2013-07-23 ENCOUNTER — Encounter (HOSPITAL_COMMUNITY): Payer: Self-pay | Admitting: Emergency Medicine

## 2013-07-23 ENCOUNTER — Emergency Department (HOSPITAL_COMMUNITY): Payer: Medicare Other

## 2013-07-23 DIAGNOSIS — Z9079 Acquired absence of other genital organ(s): Secondary | ICD-10-CM

## 2013-07-23 DIAGNOSIS — K648 Other hemorrhoids: Secondary | ICD-10-CM

## 2013-07-23 DIAGNOSIS — R112 Nausea with vomiting, unspecified: Secondary | ICD-10-CM | POA: Diagnosis not present

## 2013-07-23 DIAGNOSIS — Z87891 Personal history of nicotine dependence: Secondary | ICD-10-CM

## 2013-07-23 DIAGNOSIS — C61 Malignant neoplasm of prostate: Secondary | ICD-10-CM

## 2013-07-23 DIAGNOSIS — Z8249 Family history of ischemic heart disease and other diseases of the circulatory system: Secondary | ICD-10-CM

## 2013-07-23 DIAGNOSIS — Z8042 Family history of malignant neoplasm of prostate: Secondary | ICD-10-CM

## 2013-07-23 DIAGNOSIS — E46 Unspecified protein-calorie malnutrition: Secondary | ICD-10-CM | POA: Diagnosis present

## 2013-07-23 DIAGNOSIS — N390 Urinary tract infection, site not specified: Secondary | ICD-10-CM

## 2013-07-23 DIAGNOSIS — R000 Tachycardia, unspecified: Secondary | ICD-10-CM | POA: Diagnosis present

## 2013-07-23 DIAGNOSIS — D638 Anemia in other chronic diseases classified elsewhere: Secondary | ICD-10-CM | POA: Diagnosis present

## 2013-07-23 DIAGNOSIS — R9431 Abnormal electrocardiogram [ECG] [EKG]: Secondary | ICD-10-CM

## 2013-07-23 DIAGNOSIS — K219 Gastro-esophageal reflux disease without esophagitis: Secondary | ICD-10-CM | POA: Diagnosis present

## 2013-07-23 DIAGNOSIS — I4891 Unspecified atrial fibrillation: Secondary | ICD-10-CM | POA: Diagnosis present

## 2013-07-23 DIAGNOSIS — B957 Other staphylococcus as the cause of diseases classified elsewhere: Secondary | ICD-10-CM | POA: Diagnosis present

## 2013-07-23 DIAGNOSIS — E871 Hypo-osmolality and hyponatremia: Secondary | ICD-10-CM | POA: Diagnosis present

## 2013-07-23 DIAGNOSIS — IMO0002 Reserved for concepts with insufficient information to code with codable children: Secondary | ICD-10-CM | POA: Diagnosis present

## 2013-07-23 DIAGNOSIS — K56609 Unspecified intestinal obstruction, unspecified as to partial versus complete obstruction: Principal | ICD-10-CM | POA: Diagnosis present

## 2013-07-23 DIAGNOSIS — K565 Intestinal adhesions [bands], unspecified as to partial versus complete obstruction: Secondary | ICD-10-CM | POA: Diagnosis present

## 2013-07-23 DIAGNOSIS — E876 Hypokalemia: Secondary | ICD-10-CM

## 2013-07-23 DIAGNOSIS — D649 Anemia, unspecified: Secondary | ICD-10-CM

## 2013-07-23 LAB — COMPREHENSIVE METABOLIC PANEL
ALT: 23 U/L (ref 0–53)
AST: 18 U/L (ref 0–37)
Albumin: 4 g/dL (ref 3.5–5.2)
Chloride: 86 mEq/L — ABNORMAL LOW (ref 96–112)
Creatinine, Ser: 1.34 mg/dL (ref 0.50–1.35)
GFR calc non Af Amer: 52 mL/min — ABNORMAL LOW (ref >90)
Sodium: 132 mEq/L — ABNORMAL LOW (ref 135–145)
Total Bilirubin: 0.9 mg/dL (ref 0.3–1.2)

## 2013-07-23 LAB — CBC WITH DIFFERENTIAL/PLATELET
Basophils Absolute: 0 10*3/uL (ref 0.0–0.1)
Basophils Relative: 0 % (ref 0–1)
Eosinophils Absolute: 0 10*3/uL (ref 0.0–0.7)
HCT: 43.3 % (ref 39.0–52.0)
MCH: 31.4 pg (ref 26.0–34.0)
MCHC: 34.4 g/dL (ref 30.0–36.0)
Monocytes Absolute: 1.1 10*3/uL — ABNORMAL HIGH (ref 0.1–1.0)
Neutro Abs: 8.4 10*3/uL — ABNORMAL HIGH (ref 1.7–7.7)
Neutrophils Relative %: 76 % (ref 43–77)
Platelets: 374 10*3/uL (ref 150–400)
RDW: 13.5 % (ref 11.5–15.5)
WBC: 10.9 10*3/uL — ABNORMAL HIGH (ref 4.0–10.5)

## 2013-07-23 MED ORDER — FLUTICASONE PROPIONATE 50 MCG/ACT NA SUSP
2.0000 | Freq: Every day | NASAL | Status: DC
  Administered 2013-07-24 – 2013-08-01 (×9): 2 via NASAL
  Filled 2013-07-23: qty 16

## 2013-07-23 MED ORDER — HYDROCODONE-ACETAMINOPHEN 5-325 MG PO TABS: 1.0000 | ORAL_TABLET | ORAL | Status: DC | PRN

## 2013-07-23 MED ORDER — SODIUM CHLORIDE 0.9 % IV SOLN: INTRAVENOUS | Status: DC

## 2013-07-23 MED ORDER — ONDANSETRON HCL 4 MG/2ML IJ SOLN
4.0000 mg | Freq: Once | INTRAMUSCULAR | Status: AC
  Administered 2013-07-23: 4 mg via INTRAVENOUS
  Filled 2013-07-23: qty 2

## 2013-07-23 MED ORDER — SODIUM CHLORIDE 0.9 % IV BOLUS (SEPSIS)
1000.0000 mL | Freq: Once | INTRAVENOUS | Status: AC
  Administered 2013-07-23: 1000 mL via INTRAVENOUS

## 2013-07-23 MED ORDER — HYDROMORPHONE HCL PF 1 MG/ML IJ SOLN
0.5000 mg | INTRAMUSCULAR | Status: DC | PRN
  Administered 2013-07-24 – 2013-07-29 (×15): 0.5 mg via INTRAVENOUS
  Filled 2013-07-23 (×14): qty 1

## 2013-07-23 MED ORDER — ENOXAPARIN SODIUM 40 MG/0.4ML ~~LOC~~ SOLN
40.0000 mg | SUBCUTANEOUS | Status: DC
  Administered 2013-07-23 – 2013-07-31 (×9): 40 mg via SUBCUTANEOUS
  Filled 2013-07-23 (×10): qty 0.4

## 2013-07-23 MED ORDER — SODIUM CHLORIDE 0.9 % IJ SOLN
3.0000 mL | Freq: Two times a day (BID) | INTRAMUSCULAR | Status: DC
  Administered 2013-07-24 – 2013-07-30 (×4): 3 mL via INTRAVENOUS

## 2013-07-23 MED ORDER — SODIUM CHLORIDE 0.9 % IV SOLN
INTRAVENOUS | Status: DC
  Administered 2013-07-23: 14:00:00 via INTRAVENOUS

## 2013-07-23 MED ORDER — ONDANSETRON HCL 4 MG/2ML IJ SOLN
4.0000 mg | Freq: Four times a day (QID) | INTRAMUSCULAR | Status: DC | PRN
  Administered 2013-07-26 – 2013-07-30 (×4): 4 mg via INTRAVENOUS
  Filled 2013-07-23 (×4): qty 2

## 2013-07-23 MED ORDER — PANTOPRAZOLE SODIUM 40 MG PO TBEC
40.0000 mg | DELAYED_RELEASE_TABLET | Freq: Every day | ORAL | Status: DC
  Filled 2013-07-23: qty 1

## 2013-07-23 MED ORDER — ONDANSETRON HCL 4 MG PO TABS: 4.0000 mg | ORAL_TABLET | Freq: Four times a day (QID) | ORAL | Status: DC | PRN

## 2013-07-23 NOTE — ED Provider Notes (Signed)
CSN: [id number]     Arrival date & time 07/23/13  1130 History   First MD Initiated Contact with Patient 07/23/13 1231     Chief Complaint  Patient presents with  . Emesis  . Constipation   (Consider location/radiation/quality/duration/timing/severity/associated sxs/prior Treatment) Patient is a 72 y.o. male presenting with vomiting and constipation. The history is provided by the patient and the spouse.  Emesis Constipation Associated symptoms: vomiting    patient here with 3 day history of abdominal distention and possible coffee-ground emesis. Similar symptoms in the past associated with bowel obstruction. Notes watery stools as well. Denies any fever or chills. No chest pain chest pressure. Pain characterized as colicky. No dysuria or hematuria. Nothing makes her symptoms better or worse no treatment used prior to arrival.  Past Medical History  Diagnosis Date  . Enlarged prostate   . Prostate cancer   . Unspecified hemorrhoids without mention of complication   . Loss of weight   . GERD (gastroesophageal reflux disease)   . Environmental allergies   . Arthritis   . Hx of ulcer disease   . Nocturia   . DDD (degenerative disc disease)    Past Surgical History  Procedure Laterality Date  . Hemorrhoid banding    . Colonoscopy  03/13/2013  . Robot assisted laparoscopic radical prostatectomy N/A 06/21/2013    Procedure: ROBOTIC ASSISTED LAPAROSCOPIC RADICAL PROSTATECTOMY;  Surgeon: Sebastian Ache, MD;  Location: [facility];  Service: Urology;  Laterality: N/A;  . Lymphadenectomy Bilateral 06/21/2013    Procedure: LYMPHADENECTOMY  " BILATERAL PELVIC LYMPH NODE DISSECTION ;  Surgeon: Sebastian Ache, MD;  Location: [facility];  Service: Urology;  Laterality: Bilateral;   Family History  Problem Relation Age of Onset  . Prostate cancer Father     with mets to nodes  . Cancer Brother     colon and prostate  . Heart attack Mother    History  Substance Use Topics  . Smoking status:  Former Smoker -- 1.50 packs/day for 10 years    Types: Cigarettes    Quit date: 06/06/1972  . Smokeless tobacco: Never Used  . Alcohol Use: No    Review of Systems  Gastrointestinal: Positive for vomiting and constipation.  All other systems reviewed and are negative.    Allergies  Penicillins  Home Medications   Current Outpatient Rx  Name  Route  Sig  Dispense  Refill  . fluticasone (FLONASE) 50 MCG/ACT nasal spray   Nasal   Place 2 sprays into the nose daily.         Marland Kitchen HYDROcodone-acetaminophen (NORCO) 5-325 MG per tablet   Oral   Take 1-2 tablets by mouth every 6 (six) hours as needed.   30 tablet   0   . loratadine (CLARITIN) 10 MG tablet   Oral   Take 10 mg by mouth daily.         Marland Kitchen omeprazole (PRILOSEC) 20 MG capsule   Oral   Take 20 mg by mouth daily.         Marland Kitchen sulfamethoxazole-trimethoprim (BACTRIM DS) 800-160 MG per tablet   Oral   Take 1 tablet by mouth 2 (two) times daily. To prevent infection   40 tablet   0   . ciprofloxacin (CIPRO) 500 MG tablet   Oral   Take 1 tablet (500 mg total) by mouth 2 (two) times daily. Start day prior to office visit for foley removal   6 tablet   0    BP  130/92  Pulse 118  Temp(Src) 98.4 F (36.9 C) (Oral)  Resp 16  SpO2 97% Physical Exam  Nursing note and vitals reviewed. Constitutional: He is oriented to person, place, and time. He appears well-developed and well-nourished.  Non-toxic appearance. No distress.  HENT:  Head: Normocephalic and atraumatic.  Eyes: Conjunctivae, EOM and lids are normal. Pupils are equal, round, and reactive to light.  Neck: Normal range of motion. Neck supple. No tracheal deviation present. No mass present.  Cardiovascular: Normal rate, regular rhythm and normal heart sounds.  Exam reveals no gallop.   No murmur heard. Pulmonary/Chest: Effort normal and breath sounds normal. No stridor. No respiratory distress. He has no decreased breath sounds. He has no wheezes. He has no  rhonchi. He has no rales.  Abdominal: Soft. Normal appearance and bowel sounds are normal. He exhibits distension. There is generalized tenderness. There is no rigidity, no rebound, no guarding and no CVA tenderness.  Musculoskeletal: Normal range of motion. He exhibits no edema and no tenderness.  Neurological: He is alert and oriented to person, place, and time. He has normal strength. No cranial nerve deficit or sensory deficit. GCS eye subscore is 4. GCS verbal subscore is 5. GCS motor subscore is 6.  Skin: Skin is warm and dry. No abrasion and no rash noted.  Psychiatric: He has a normal mood and affect. His speech is normal and behavior is normal.    ED Course  Procedures (including critical care time) Labs Review Labs Reviewed  CBC WITH DIFFERENTIAL  COMPREHENSIVE METABOLIC PANEL  LIPASE, BLOOD   Imaging Review No results found.  EKG Interpretation    Date/Time:  Tuesday July 23 2013 11:53:33 EST Ventricular Rate:  112 PR Interval:  158 QRS Duration: 72 QT Interval:  339 QTC Calculation: 463 R Axis:   42 Text Interpretation:  Sinus tachycardia Right atrial enlargement Anterior infarct, old Confirmed by Stassi Fadely  MD, Monia Timmers ([id number]) on 07/23/2013 12:57:28 PM            MDM  No diagnosis found.   Patient had NG tube placed for small bowel obstruction and be admitted to the medicine service. IV hydration has been given heart rate has improved  Toy Baker, MD 07/23/13 1408

## 2013-07-23 NOTE — H&P (Signed)
[facility] History and Physical  Keith Bates WUJ:[id number] DOB: 02-08-1941 DOA: 07/23/2013  Referring physician: ED physician PCP: Park Pope, MD   Chief Complaint: Abdominal pain and vomiting  HPI:  Patient is 72 year old male with history of prostate cancer, small bowel obstruction, recent prolonged hospital stay and discharged on 07/09/2013, status post robotic radical prostatectomy with bilateral pelvic lymphadenectomy 06/21/2013 for Gleason 4+3=7 adenocarcinoma and PSA 66, who now presents to [facility] long emergency department with main concern of progressively worsening abdominal distention and coffee ground emesis that initially started 3 days prior to this admission. He reports abdominal discomfort in epigastric area, intermittent and throbbing, worse with any oral intake and with no specific alleviating symptoms, no radiating symptoms, 5/10 in severity when present. Patient reports similar events in the past that were due to small bowel obstruction. Patient denies fevers and chills, no chest pain or shortness of breath, no specific urinary concerns. In emergency department, x-ray findings consistent with small bowel obstruction, NG tube placed in emergency department and [facility] asked to admit for further evaluation and management.    Assessment and Plan: Active Problems:   Small bowel obstruction - will admit to telemetry bed due to tachycardia - NG tube is already in place - Continue to provide supportive care with IV fluids, analgesia and antiemetics as needed - Keep n.p.o. for now   Hyponatremia - Most likely prerenal in etiology and secondary to poor oral intake and vomiting - Continue IV fluids as mentioned above and repeat BMP in the morning   Leukocytosis - With low-grade fever of 99.4 Fahrenheit noted in ED - X-ray did not indicate any cardiopulmonary events that could explain low-grade fever and leukocytosis - This could be related to small bowel obstruction but  clear etiology not evident at this time - Will check urinalysis, may need to place on empiric antibiotic if fever and leukocytosis persist   Tachycardia - Patient has history of atrial fibrillation but is currently in sinus rhythm - Tachycardia can be due to dehydration, monitor on telemetry for 24 hours - Patient denies chest pain and shortness of breath - IV fluids  Code Status: Full Family Communication: Pt at bedside Disposition Plan: Admit to telemetry bed   Review of Systems:  Constitutional: Negative for diaphoresis.  HENT: Negative for hearing loss, ear pain, nosebleeds, congestion, sore throat, neck pain, tinnitus and ear discharge.   Eyes: Negative for blurred vision, double vision, photophobia, pain, discharge and redness.  Respiratory: Negative for cough, hemoptysis, sputum production, shortness of breath, wheezing and stridor.   Cardiovascular: Negative for chest pain, palpitations, orthopnea, claudication and leg swelling.  Gastrointestinal: Per history of present illness Genitourinary: Negative for dysuria, urgency, frequency, hematuria and flank pain.  Musculoskeletal: Negative for myalgias, back pain, joint pain and falls.  Skin: Negative for itching and rash.  Neurological: Negative for tingling, tremors, sensory change, speech change, focal weakness, loss of consciousness and headaches.  Endo/Heme/Allergies: Negative for environmental allergies and polydipsia. Does not bruise/bleed easily.  Psychiatric/Behavioral: Negative for suicidal ideas. The patient is not nervous/anxious.      Past Medical History  Diagnosis Date  . Enlarged prostate   . Prostate cancer   . Unspecified hemorrhoids without mention of complication   . Loss of weight   . GERD (gastroesophageal reflux disease)   . Environmental allergies   . Arthritis   . Hx of ulcer disease   . Nocturia   . DDD (degenerative disc disease)     Past Surgical History  Procedure Laterality Date  .  Hemorrhoid banding    . Colonoscopy  03/13/2013  . Robot assisted laparoscopic radical prostatectomy N/A 06/21/2013    Procedure: ROBOTIC ASSISTED LAPAROSCOPIC RADICAL PROSTATECTOMY;  Surgeon: Sebastian Ache, MD;  Location: [facility];  Service: Urology;  Laterality: N/A;  . Lymphadenectomy Bilateral 06/21/2013    Procedure: LYMPHADENECTOMY  " BILATERAL PELVIC LYMPH NODE DISSECTION ;  Surgeon: Sebastian Ache, MD;  Location: [facility];  Service: Urology;  Laterality: Bilateral;    Social History:  reports that he quit smoking about 41 years ago. His smoking use included Cigarettes. He has a 15 pack-year smoking history. He has never used smokeless tobacco. He reports that he does not drink alcohol or use illicit drugs.  Allergies  Allergen Reactions  . 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 Plasticized Base [Plastibase] Rash  . Penicillins Rash    Family History  Problem Relation Age of Onset  . Prostate cancer Father     with mets to nodes  . Cancer Brother     colon and prostate  . Heart attack Mother     Prior to Admission medications   Medication Sig Start Date End Date Taking? Authorizing Provider  fluticasone (FLONASE) 50 MCG/ACT nasal spray Place 2 sprays into the nose daily.   Yes Historical Provider, MD  HYDROcodone-acetaminophen (NORCO) 5-325 MG per tablet Take 1-2 tablets by mouth every 6 (six) hours as needed. 06/21/13  Yes Darol Destine. Myer Haff, PA-C  loratadine (CLARITIN) 10 MG tablet Take 10 mg by mouth daily.   Yes Historical Provider, MD  omeprazole (PRILOSEC) 20 MG capsule Take 20 mg by mouth daily.   Yes Historical Provider, MD  sulfamethoxazole-trimethoprim (BACTRIM DS) 800-160 MG per tablet Take 1 tablet by mouth 2 (two) times daily. To prevent infection 07/09/13  Yes Sebastian Ache, MD  ciprofloxacin (CIPRO) 500 MG tablet Take 1 tablet (500 mg total) by mouth 2 (two) times daily. Start day prior to office visit for foley removal 06/21/13   Darol Destine.  Myer Haff, PA-C    Physical Exam: Filed Vitals:   07/23/13 1430 07/23/13 1500 07/23/13 1530 07/23/13 1557  BP: 149/70 153/86 147/78 147/78  Pulse: 104 106 104 110  Temp:    99.4 F (37.4 C)  TempSrc:    Oral  Resp: 13 14 21 18   SpO2: 97% 96% 97% 100%    Physical Exam  Constitutional: Appears well-developed and well-nourished. No distress.  HENT: Normocephalic. External right and left ear normal. Dry mucous membranes Eyes: Conjunctivae and EOM are normal. PERRLA, no scleral icterus.  Neck: Normal ROM. Neck supple. No JVD. No tracheal deviation. No thyromegaly.  CVS: Regular rhythm, tachycardic, S1/S2 +, no murmurs, no gallops, no carotid bruit.  Pulmonary: Effort and breath sounds normal, no stridor, rhonchi, wheezes, rales.  Abdominal: Soft. BS +,  Distension noted with tenderness in epigastric area, no rebound or guarding.  Musculoskeletal: Normal range of motion. No edema and no tenderness.  Lymphadenopathy: No lymphadenopathy noted, cervical, inguinal. Neuro: Alert. Normal reflexes, muscle tone coordination. No cranial nerve deficit. Skin: Skin is warm and dry. No rash noted. Not diaphoretic. No erythema. No pallor.  Psychiatric: Normal mood and affect. Behavior, judgment, thought content normal.   Labs on Admission:  Basic Metabolic Panel:  Recent Labs Lab 07/23/13 1245  NA 132*  K 3.8  CL 86*  CO2 34*  GLUCOSE 128*  BUN 24*  CREATININE 1.34  CALCIUM 10.9*   Liver  Function Tests:  Recent Labs Lab 07/23/13 1245  AST 18  ALT 23  ALKPHOS 112  BILITOT 0.9  PROT 8.2  ALBUMIN 4.0    Recent Labs Lab 07/23/13 1245  LIPASE 48   CBC:  Recent Labs Lab 07/23/13 1245  WBC 10.9*  NEUTROABS 8.4*  HGB 14.9  HCT 43.3  MCV 91.4  PLT 374   Radiological Exams on Admission: Dg Abd Acute W/chest   07/23/2013  Findings consistent with small bowel obstruction. No free air. No edema or consolidation.    EKG: Normal sinus rhythm, no ST/T wave  changes  Debbora Presto, MD  [facility] Pager [phone]  If 7PM-7AM, please contact night-coverage [email or web address] Password [email or web address] 07/23/2013, 4:15 PM

## 2013-07-23 NOTE — ED Notes (Addendum)
Pt reports a developing nausea and emesis that began Saturday night. Pt also reports difficulty having bowel movements. Pt reports having a BM yesterday, which he used a suppository. Pt states he has had difficulty digesting food and has had a similar problem when he had his prostate removed in November of this year. Pt reports generalized abdominal pain and states that he is bloated. Pt reports difficulty holding urine since his surgery.

## 2013-07-24 ENCOUNTER — Encounter (HOSPITAL_COMMUNITY): Payer: Self-pay | Admitting: Radiology

## 2013-07-24 ENCOUNTER — Ambulatory Visit (HOSPITAL_COMMUNITY): Payer: Medicare Other

## 2013-07-24 DIAGNOSIS — E876 Hypokalemia: Secondary | ICD-10-CM

## 2013-07-24 DIAGNOSIS — N390 Urinary tract infection, site not specified: Secondary | ICD-10-CM

## 2013-07-24 DIAGNOSIS — C61 Malignant neoplasm of prostate: Secondary | ICD-10-CM

## 2013-07-24 LAB — URINALYSIS, ROUTINE W REFLEX MICROSCOPIC
Bilirubin Urine: NEGATIVE
Glucose, UA: NEGATIVE mg/dL
Protein, ur: 30 mg/dL — AB
Specific Gravity, Urine: 1.022 (ref 1.005–1.030)
Urobilinogen, UA: 0.2 mg/dL (ref 0.0–1.0)
pH: 6 (ref 5.0–8.0)

## 2013-07-24 LAB — BASIC METABOLIC PANEL
BUN: 21 mg/dL (ref 6–23)
Chloride: 93 mEq/L — ABNORMAL LOW (ref 96–112)
GFR calc Af Amer: 73 mL/min — ABNORMAL LOW (ref >90)
GFR calc non Af Amer: 63 mL/min — ABNORMAL LOW (ref >90)
Glucose, Bld: 110 mg/dL — ABNORMAL HIGH (ref 70–99)
Potassium: 3.4 mEq/L — ABNORMAL LOW (ref 3.5–5.1)
Sodium: 136 mEq/L (ref 135–145)

## 2013-07-24 LAB — CBC
HCT: 40.3 % (ref 39.0–52.0)
Hemoglobin: 13.7 g/dL (ref 13.0–17.0)
MCH: 31 pg (ref 26.0–34.0)
MCHC: 34 g/dL (ref 30.0–36.0)
MCV: 91.2 fL (ref 78.0–100.0)
Platelets: 344 10*3/uL (ref 150–400)
RBC: 4.42 MIL/uL (ref 4.22–5.81)
RDW: 13.5 % (ref 11.5–15.5)
WBC: 8.9 10*3/uL (ref 4.0–10.5)

## 2013-07-24 LAB — URINE MICROSCOPIC-ADD ON

## 2013-07-24 MED ORDER — IOHEXOL 300 MG/ML  SOLN
25.0000 mL | INTRAMUSCULAR | Status: AC
  Administered 2013-07-24 (×2): 25 mL via ORAL

## 2013-07-24 MED ORDER — KCL IN DEXTROSE-NACL 20-5-0.45 MEQ/L-%-% IV SOLN
INTRAVENOUS | Status: AC
  Administered 2013-07-24 – 2013-07-27 (×5): via INTRAVENOUS
  Filled 2013-07-24 (×7): qty 1000

## 2013-07-24 MED ORDER — PANTOPRAZOLE SODIUM 40 MG IV SOLR
40.0000 mg | Freq: Two times a day (BID) | INTRAVENOUS | Status: DC
  Administered 2013-07-24 – 2013-08-01 (×17): 40 mg via INTRAVENOUS
  Filled 2013-07-24 (×18): qty 40

## 2013-07-24 MED ORDER — DEXTROSE 5 % IV SOLN
1.0000 g | INTRAVENOUS | Status: DC
  Administered 2013-07-24 – 2013-07-26 (×3): 1 g via INTRAVENOUS
  Filled 2013-07-24 (×4): qty 10

## 2013-07-24 MED ORDER — IOHEXOL 300 MG/ML  SOLN
100.0000 mL | Freq: Once | INTRAMUSCULAR | Status: AC | PRN
  Administered 2013-07-24: 100 mL via INTRAVENOUS

## 2013-07-24 NOTE — Progress Notes (Addendum)
[facility] PROGRESS NOTE  Keith Bates WUJ:[id number] DOB: 22-Mar-1941 DOA: 07/23/2013 PCP: Park Pope, MD  Assessment/Plan  Small bowel obstruction recurrent. The patient has a small bowel obstruction when he was admitted to the hospital last month for prostatectomy which was complicated by bowel obstruction and urine leak. -  CT of abdomen and pelvis demonstrates small bowel obstruction with transition point at the location of the JP drain at the right lower quadrant. -  Urology assisted with removal of the JP drain -  continue NG to low intermittent suction -  Continue IV fluids, analgesia and antiemetics -  Ice chips only -  Await return of flatus and bowel sounds -  Is slow to progress, surgery consultation  Hyponatremia Likely due to dehydration resolved with IV fluids  Hypokalemia, likely secondary to GI losses.  Start IVF with potassium  Leukocytosis Resolved.  Chest x-ray negative. Urinalysis marginally positive for a UTI with large leukocyte esterase and 21-50 white blood cells, 11-20 red blood cells -   ceftriaxone  -  Followup urine culture   Tachycardia  - Patient has history of atrial fibrillation but is currently in sinus rhythm  - Tachycardia can be due to dehydration, monitor on telemetry for 24 hours  - Patient denies chest pain and shortness of breath  - IV fluids  Diet:  N.p.o.  Access:  PIV  IVF:  Yes  Proph:  Lovenox   Code Status: Full  Family Communication: Patient, his wife, and his daughter  Disposition Plan: Pending improvement in his small bowel obstruction   Consultants:  Urology   Procedures:  Removal of his JP drain on 12/17    CT of abdomen and pelvis on 12/17  Antibiotics:  Ceftriaxone from 12/17   HPI/Subjective:  Patient says that he feels fairly well, however he has not had flatus or bowel movement since 3 days ago.  He denies abdominal pain but states that his abdomen is becoming somewhat tense  Objective: Filed  Vitals:   07/23/13 1700 07/23/13 2132 07/24/13 0440 07/24/13 1358  BP: 152/86 136/76 154/87 136/90  Pulse: 109 101 110 101  Temp: 99.2 F (37.3 C) 98.8 F (37.1 C) 98.4 F (36.9 C) 98.7 F (37.1 C)  TempSrc: Oral Oral Oral Oral  Resp: 18 18 18 16   Height: 5\' 7"  (1.702 m)     Weight:  62.7 kg (138 lb 3.7 oz)    SpO2: 98% 96% 96% 98%    Intake/Output Summary (Last 24 hours) at 07/24/13 1851 Last data filed at 07/24/13 9562  Gross per 24 hour  Intake  672.5 ml  Output   1750 ml  Net -1077.5 ml   Filed Weights   07/23/13 2132  Weight: 62.7 kg (138 lb 3.7 oz)    Exam:   General:   thin African American male, No acute distress  HEENT:  NCAT, MMM  Cardiovascular:  RRR, nl S1, S2 no mrg, 2+ pulses, warm extremities  Respiratory:  CTAB, no increased WOB  Abdomen:    hypoactive bowel sounds in the bowel sounds are present her very high pitched in nature, mild distention, nontender to palpation  MSK:   Normal tone and bulk, no LEE  Neuro:  Grossly intact  Data Reviewed: Basic Metabolic Panel:  Recent Labs Lab 07/23/13 1245 07/24/13 0501  NA 132* 136  K 3.8 3.4*  CL 86* 93*  CO2 34* 30  GLUCOSE 128* 110*  BUN 24* 21  CREATININE 1.34 1.14  CALCIUM 10.9* 9.9  Liver Function Tests:  Recent Labs Lab 07/23/13 1245  AST 18  ALT 23  ALKPHOS 112  BILITOT 0.9  PROT 8.2  ALBUMIN 4.0    Recent Labs Lab 07/23/13 1245  LIPASE 48   No results found for this basename: AMMONIA,  in the last 168 hours CBC:  Recent Labs Lab 07/23/13 1245 07/24/13 0501  WBC 10.9* 8.9  NEUTROABS 8.4*  --   HGB 14.9 13.7  HCT 43.3 40.3  MCV 91.4 91.2  PLT 374 344   Cardiac Enzymes: No results found for this basename: CKTOTAL, CKMB, CKMBINDEX, TROPONINI,  in the last 168 hours BNP (last 3 results) No results found for this basename: PROBNP,  in the last 8760 hours CBG: No results found for this basename: GLUCAP,  in the last 168 hours  No results found for this or  any previous visit (from the past 240 hour(s)).   Studies: Ct Abdomen Pelvis W Contrast  07/24/2013   CLINICAL DATA:  Small bowel obstruction.  Prostate cancer.  EXAM: CT ABDOMEN AND PELVIS WITH CONTRAST  TECHNIQUE: Multidetector CT imaging of the abdomen and pelvis was performed using the standard protocol following bolus administration of intravenous contrast.  CONTRAST:  OMNIPAQUE IOHEXOL 300 MG/ML  SOLN  COMPARISON:  CT scan dated 07/01/2013  FINDINGS: Slight atelectasis at the lung bases has almost completely resolved.  There is a small bowel obstruction in the right lower quadrant. The small bowel is obstructed immediately anterior to the draining which is crossing under the point of obstruction. This is best seen on image number 49 of series 603.  The liver, biliary tree, spleen, pancreas, adrenal glands, and kidneys are normal.  The colon is not distended.  Catheter has been removed from the bladder. Bladder wall is somewhat thickened. There is no residual free fluid in the abdomen or pelvis.  IMPRESSION: 1. Small bowel obstruction immediately adjacent to the drainage tube in the right lower quadrant. 2. There is no residual free fluid in the abdomen.   Electronically Signed   By: Geanie Cooley M.D.   On: 07/24/2013 13:23   Dg Abd Acute W/chest  07/23/2013   CLINICAL DATA:  Nausea and emesis  EXAM: ACUTE ABDOMEN SERIES (ABDOMEN 2 VIEW & CHEST 1 VIEW)  COMPARISON:  Chest radiograph June 27, 2013; abdomen July 18, 2013  FINDINGS: PA chest: Lungs are clear. Heart size and pulmonary vascularity are normal. No adenopathy. There is arthropathy in both shoulders.  Supine and upright abdomen: There are multiple loops of dilated small bowel with multiple air-fluid levels. The appearance is consistent with bowel obstruction. No free air. There are phleboliths in the pelvis.  IMPRESSION: Findings consistent with small bowel obstruction. No free air. No edema or consolidation.   Electronically  Signed   By: Bretta Bang M.D.   On: 07/23/2013 13:56    Scheduled Meds: . cefTRIAXone (ROCEPHIN)  IV  1 g Intravenous Q24H  . enoxaparin (LOVENOX) injection  40 mg Subcutaneous Q24H  . fluticasone  2 spray Each Nare Daily  . pantoprazole (PROTONIX) IV  40 mg Intravenous Q12H  . sodium chloride  3 mL Intravenous Q12H   Continuous Infusions: . dextrose 5 % and 0.45 % NaCl with KCl 20 mEq/L 75 mL/hr at 07/24/13 0844    Active Problems:   Small bowel obstruction   SBO (small bowel obstruction)    Time spent: 30 min    Jontavious Commons, [facility]  [facility] Pager [phone]. If 7PM-7AM, please contact  night-coverage at [email or web address], password [facility] 07/24/2013, 6:51 PM  LOS: 1 day

## 2013-07-24 NOTE — Consult Note (Signed)
Reason for Consult: Small Bowel Obstruction, Prostate Cancer  Referring Physician: Renae Fickle MD  Keith Bates is an 72 y.o. male.  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 TRUS volume 193gm with large median lobe. Path T3bN0Mx with multifocal positive margins including bladder neck, seminal vesicals. Course complicated by urine leak that resolved with prolonged vented suction drainage as confirmed by cystogram last week. Had JP in place with plan to remove this weak to doubly confirm no high output after foley removal  2 - Small Bowel Obstruction - Pt with new small bowel obstruction by CT 12/17 durnig admission for n/v. No abscess, recurrent leak, bowel perforation. Current JP drain felt to be possible nidus.    Today [name] is seen in consultation for above. He has NGT in place and has had no flatus in 2 days. He did have a good abotu 1 week interval after foley removal when he was doing better and regaining control of urine.   Past Medical History  Diagnosis Date  . Enlarged prostate   . Prostate cancer   . Unspecified hemorrhoids without mention of complication   . Loss of weight   . GERD (gastroesophageal reflux disease)   . Environmental allergies   . Arthritis   . Hx of ulcer disease   . Nocturia   . DDD (degenerative disc disease)     Past Surgical History  Procedure Laterality Date  . Hemorrhoid banding    . Colonoscopy  03/13/2013  . Robot assisted laparoscopic radical prostatectomy N/A 06/21/2013    Procedure: ROBOTIC ASSISTED LAPAROSCOPIC RADICAL PROSTATECTOMY;  Surgeon: Sebastian Ache, MD;  Location: [facility];  Service: Urology;  Laterality: N/A;  . Lymphadenectomy Bilateral 06/21/2013    Procedure: LYMPHADENECTOMY  " BILATERAL PELVIC LYMPH NODE DISSECTION ;  Surgeon: Sebastian Ache, MD;  Location: [facility];  Service:  Urology;  Laterality: Bilateral;    Family History  Problem Relation Age of Onset  . Prostate cancer Father     with mets to nodes  . Cancer Brother     colon and prostate  . Heart attack Mother     Social History:  reports that he quit smoking about 41 years ago. His smoking use included Cigarettes. He has a 15 pack-year smoking history. He has never used smokeless tobacco. He reports that he does not drink alcohol or use illicit drugs.  Allergies:  Allergies  Allergen Reactions  . 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 Plasticized Base [Plastibase] Rash  . Penicillins Rash    Medications: I have reviewed the patient's current medications.  Results for orders placed during the hospital encounter of 07/23/13 (from the past 48 hour(s))  CBC WITH DIFFERENTIAL     Status: Abnormal   Collection Time    07/23/13 12:45 PM      Result Value Range   WBC 10.9 (*) 4.0 - 10.5 K/uL   RBC 4.74  4.22 - 5.81 MIL/uL   Hemoglobin 14.9  13.0 - 17.0 g/dL   HCT 81.1  91.4 - 78.2 %   MCV 91.4  78.0 - 100.0 fL   MCH 31.4  26.0 - 34.0 pg   MCHC 34.4  30.0 - 36.0 g/dL   RDW 95.6  21.3 - 08.6 %   Platelets 374  150 - 400 K/uL   Neutrophils Relative %  76  43 - 77 %   Neutro Abs 8.4 (*) 1.7 - 7.7 K/uL   Lymphocytes Relative 13  12 - 46 %   Lymphs Abs 1.4  0.7 - 4.0 K/uL   Monocytes Relative 10  3 - 12 %   Monocytes Absolute 1.1 (*) 0.1 - 1.0 K/uL   Eosinophils Relative 0  0 - 5 %   Eosinophils Absolute 0.0  0.0 - 0.7 K/uL   Basophils Relative 0  0 - 1 %   Basophils Absolute 0.0  0.0 - 0.1 K/uL  COMPREHENSIVE METABOLIC PANEL     Status: Abnormal   Collection Time    07/23/13 12:45 PM      Result Value Range   Sodium 132 (*) 135 - 145 mEq/L   Potassium 3.8  3.5 - 5.1 mEq/L   Chloride 86 (*) 96 - 112 mEq/L   CO2 34 (*) 19 - 32 mEq/L   Glucose, Bld 128 (*) 70 - 99 mg/dL   BUN 24 (*) 6 - 23 mg/dL   Creatinine, Ser 2.13  0.50 - 1.35 mg/dL   Calcium 08.6 (*) 8.4 - 10.5  mg/dL   Total Protein 8.2  6.0 - 8.3 g/dL   Albumin 4.0  3.5 - 5.2 g/dL   AST 18  0 - 37 U/L   ALT 23  0 - 53 U/L   Alkaline Phosphatase 112  39 - 117 U/L   Total Bilirubin 0.9  0.3 - 1.2 mg/dL   GFR calc non Af Amer 52 (*) >90 mL/min   GFR calc Af Amer 60 (*) >90 mL/min   Comment: (NOTE)     The eGFR has been calculated using the CKD EPI equation.     This calculation has not been validated in all clinical situations.     eGFR's persistently <90 mL/min signify possible Chronic Kidney     Disease.  LIPASE, BLOOD     Status: None   Collection Time    07/23/13 12:45 PM      Result Value Range   Lipase 48  11 - 59 U/L  BASIC METABOLIC PANEL     Status: Abnormal   Collection Time    07/24/13  5:01 AM      Result Value Range   Sodium 136  135 - 145 mEq/L   Potassium 3.4 (*) 3.5 - 5.1 mEq/L   Chloride 93 (*) 96 - 112 mEq/L   Comment: REPEATED TO VERIFY     DELTA CHECK NOTED   CO2 30  19 - 32 mEq/L   Glucose, Bld 110 (*) 70 - 99 mg/dL   BUN 21  6 - 23 mg/dL   Creatinine, Ser 5.78  0.50 - 1.35 mg/dL   Calcium 9.9  8.4 - 46.9 mg/dL   GFR calc non Af Amer 63 (*) >90 mL/min   GFR calc Af Amer 73 (*) >90 mL/min   Comment: (NOTE)     The eGFR has been calculated using the CKD EPI equation.     This calculation has not been validated in all clinical situations.     eGFR's persistently <90 mL/min signify possible Chronic Kidney     Disease.  CBC     Status: None   Collection Time    07/24/13  5:01 AM      Result Value Range   WBC 8.9  4.0 - 10.5 K/uL   RBC 4.42  4.22 - 5.81 MIL/uL   Hemoglobin 13.7  13.0 -  17.0 g/dL   HCT 16.1  09.6 - 04.5 %   MCV 91.2  78.0 - 100.0 fL   MCH 31.0  26.0 - 34.0 pg   MCHC 34.0  30.0 - 36.0 g/dL   RDW 40.9  81.1 - 91.4 %   Platelets 344  150 - 400 K/uL  URINALYSIS, ROUTINE W REFLEX MICROSCOPIC     Status: Abnormal   Collection Time    07/24/13  6:22 AM      Result Value Range   Color, Urine YELLOW  YELLOW   APPearance CLOUDY (*) CLEAR    Specific Gravity, Urine 1.022  1.005 - 1.030   pH 6.0  5.0 - 8.0   Glucose, UA NEGATIVE  NEGATIVE mg/dL   Hgb urine dipstick LARGE (*) NEGATIVE   Bilirubin Urine NEGATIVE  NEGATIVE   Ketones, ur 15 (*) NEGATIVE mg/dL   Protein, ur 30 (*) NEGATIVE mg/dL   Urobilinogen, UA 0.2  0.0 - 1.0 mg/dL   Nitrite NEGATIVE  NEGATIVE   Leukocytes, UA LARGE (*) NEGATIVE  URINE MICROSCOPIC-ADD ON     Status: Abnormal   Collection Time    07/24/13  6:22 AM      Result Value Range   Squamous Epithelial / LPF RARE  RARE   WBC, UA 21-50  <3 WBC/hpf   RBC / HPF 11-20  <3 RBC/hpf   Bacteria, UA FEW (*) RARE    Ct Abdomen Pelvis W Contrast  07/24/2013   CLINICAL DATA:  Small bowel obstruction.  Prostate cancer.  EXAM: CT ABDOMEN AND PELVIS WITH CONTRAST  TECHNIQUE: Multidetector CT imaging of the abdomen and pelvis was performed using the standard protocol following bolus administration of intravenous contrast.  CONTRAST:  OMNIPAQUE IOHEXOL 300 MG/ML  SOLN  COMPARISON:  CT scan dated 07/01/2013  FINDINGS: Slight atelectasis at the lung bases has almost completely resolved.  There is a small bowel obstruction in the right lower quadrant. The small bowel is obstructed immediately anterior to the draining which is crossing under the point of obstruction. This is best seen on image number 49 of series 603.  The liver, biliary tree, spleen, pancreas, adrenal glands, and kidneys are normal.  The colon is not distended.  Catheter has been removed from the bladder. Bladder wall is somewhat thickened. There is no residual free fluid in the abdomen or pelvis.  IMPRESSION: 1. Small bowel obstruction immediately adjacent to the drainage tube in the right lower quadrant. 2. There is no residual free fluid in the abdomen.   Electronically Signed   By: Geanie Cooley M.D.   On: 07/24/2013 13:23   Dg Abd Acute W/chest  07/23/2013   CLINICAL DATA:  Nausea and emesis  EXAM: ACUTE ABDOMEN SERIES (ABDOMEN 2 VIEW & CHEST 1 VIEW)   COMPARISON:  Chest radiograph June 27, 2013; abdomen July 18, 2013  FINDINGS: PA chest: Lungs are clear. Heart size and pulmonary vascularity are normal. No adenopathy. There is arthropathy in both shoulders.  Supine and upright abdomen: There are multiple loops of dilated small bowel with multiple air-fluid levels. The appearance is consistent with bowel obstruction. No free air. There are phleboliths in the pelvis.  IMPRESSION: Findings consistent with small bowel obstruction. No free air. No edema or consolidation.   Electronically Signed   By: Bretta Bang M.D.   On: 07/23/2013 13:56    Review of Systems  Constitutional: Negative.  Negative for fever, chills, weight loss and malaise/fatigue.  Eyes: Negative.  Respiratory: Negative.   Cardiovascular: Negative.   Gastrointestinal: Positive for nausea, vomiting and abdominal pain. Negative for diarrhea, constipation and blood in stool.  Genitourinary: Negative.   Musculoskeletal: Negative.   Skin: Negative.   Neurological: Negative.   Endo/Heme/Allergies: Negative.    Blood pressure 136/90, pulse 101, temperature 98.7 F (37.1 C), temperature source Oral, resp. rate 16, height 5\' 7"  (1.702 m), weight 62.7 kg (138 lb 3.7 oz), SpO2 98.00%. Physical Exam  Constitutional: He is oriented to person, place, and time. He appears well-developed and well-nourished.  Wife at bedside  HENT:  Head: Normocephalic and atraumatic.  Eyes: EOM are normal. Pupils are equal, round, and reactive to light.  Neck: Normal range of motion. Neck supple.  Cardiovascular: Normal rate.   Respiratory: Effort normal.  GI: He exhibits distension. There is tenderness. There is no rebound and no guarding.  Distended and with decreased BS. Prior JP drain removed and dry dressing applied.  Genitourinary: Penis normal.  Musculoskeletal: Normal range of motion.  Neurological: He is alert and oriented to person, place, and time.  Skin: Skin is warm and dry.   Psychiatric: He has a normal mood and affect. His behavior is normal. Judgment and thought content normal.    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  2 - Small Bowel Obstruction - JP drain removed today as no longer necessary (was planning to do tomorrow at office visit previously). I hope this will improve his current symptoms.  If he does not progress in next 24-48 hrs would consider general surgery opinion.  Will follow. Call with questions anytime.  Milanya Sunderland 07/24/2013, 5:05 PM

## 2013-07-24 NOTE — Progress Notes (Signed)
INITIAL NUTRITION ASSESSMENT  DOCUMENTATION CODES Per approved criteria  -Not Applicable   INTERVENTION: Diet advancement per MD discretion Provide Resource Breeze or Ensure Complete BID when diet advanced  NUTRITION DIAGNOSIS: Inadequate oral intake related to inability to eat as evidenced by NPO status.   Goal: Pt to meet >/= 90% of their estimated nutrition needs   Monitor:  Bowel Function Diet advancement PO intake Weight Labs  Reason for Assessment: Malnutrition Screening Tool, score of 3  72 y.o. male  Admitting Dx: SBO  ASSESSMENT: 72 year old male with history of prostate cancer, small bowel obstruction, recent prolonged hospital stay and discharged on 07/09/2013, status post robotic radical prostatectomy with bilateral pelvic lymphadenectomy 06/21/2013, who now presents to [facility] long emergency department with main concern of progressively worsening abdominal distention and coffee ground emesis that initially started 3 days prior to this admission.  RD familiar with pt from previous admission. Pt reports that after discharge 12/2 he was eating very well at home (Per pt's wife he was eating "too much") until 3 days ago. Pt states that for the past 3 days he has mostly been eating popsicles due to abdominal pain and nausea. Pt states he usually weighs 157 lbs. Weight history shows 9% weight loss in less than 2 months.  Height: Ht Readings from Last 1 Encounters:  07/23/13 5\' 7"  (1.702 m)    Weight: Wt Readings from Last 1 Encounters:  07/23/13 138 lb 3.7 oz (62.7 kg)    Ideal Body Weight: 148 lbs  % Ideal Body Weight: 93%  Wt Readings from Last 10 Encounters:  07/23/13 138 lb 3.7 oz (62.7 kg)  07/04/13 140 lb 14 oz (63.9 kg)  07/04/13 140 lb 14 oz (63.9 kg)  06/17/13 151 lb (68.493 kg)  06/06/13 151 lb (68.493 kg)  05/17/13 151 lb 3.2 oz (68.584 kg)  04/09/13 157 lb (71.215 kg)  03/07/13 147 lb (66.679 kg)    Usual Body Weight: 157 lbs  % Usual Body  Weight: 88%  BMI:  Body mass index is 21.64 kg/(m^2). (normal range)  Estimated Nutritional Needs: Kcal: 1700-1900 Protein: 75-85 grams Fluid: 1.7-1.9 L/day  Skin: non-pitting RLE and LLE edema; abdominal incision  Diet Order: NPO  EDUCATION NEEDS: -No education needs identified at this time   Intake/Output Summary (Last 24 hours) at 07/24/13 1157 Last data filed at 07/24/13 0619  Gross per 24 hour  Intake 1047.5 ml  Output   1750 ml  Net -702.5 ml    Last BM: 12/14  Labs:   Recent Labs Lab 07/23/13 1245 07/24/13 0501  NA 132* 136  K 3.8 3.4*  CL 86* 93*  CO2 34* 30  BUN 24* 21  CREATININE 1.34 1.14  CALCIUM 10.9* 9.9  GLUCOSE 128* 110*    CBG (last 3)  No results found for this basename: GLUCAP,  in the last 72 hours  Scheduled Meds: . cefTRIAXone (ROCEPHIN)  IV  1 g Intravenous Q24H  . enoxaparin (LOVENOX) injection  40 mg Subcutaneous Q24H  . fluticasone  2 spray Each Nare Daily  . pantoprazole (PROTONIX) IV  40 mg Intravenous Q12H  . sodium chloride  3 mL Intravenous Q12H    Continuous Infusions: . dextrose 5 % and 0.45 % NaCl with KCl 20 mEq/L 75 mL/hr at 07/24/13 0844    Past Medical History  Diagnosis Date  . Enlarged prostate   . Prostate cancer   . Unspecified hemorrhoids without mention of complication   . Loss of weight   .  GERD (gastroesophageal reflux disease)   . Environmental allergies   . Arthritis   . Hx of ulcer disease   . Nocturia   . DDD (degenerative disc disease)     Past Surgical History  Procedure Laterality Date  . Hemorrhoid banding    . Colonoscopy  03/13/2013  . Robot assisted laparoscopic radical prostatectomy N/A 06/21/2013    Procedure: ROBOTIC ASSISTED LAPAROSCOPIC RADICAL PROSTATECTOMY;  Surgeon: Sebastian Ache, MD;  Location: [facility];  Service: Urology;  Laterality: N/A;  . Lymphadenectomy Bilateral 06/21/2013    Procedure: LYMPHADENECTOMY  " BILATERAL PELVIC LYMPH NODE DISSECTION ;  Surgeon: Sebastian Ache, MD;  Location: [facility];  Service: Urology;  Laterality: Bilateral;    Ian Malkin RD, LDN Inpatient Clinical Dietitian Pager: [phone] After Hours Pager: [phone]

## 2013-07-24 NOTE — Progress Notes (Signed)
Patient was complaining of an 8 out of 10 pain. Then while nurse was in there patient vomited with the NG tube. After that one episode RN checked placement and it was in the stomach. After placement was checked, the NG tube started draining better and patient stated he didn't have anymore nausea. Will continue to monitor patient.

## 2013-07-25 DIAGNOSIS — E876 Hypokalemia: Secondary | ICD-10-CM

## 2013-07-25 DIAGNOSIS — N390 Urinary tract infection, site not specified: Secondary | ICD-10-CM

## 2013-07-25 LAB — CBC
MCH: 30.7 pg (ref 26.0–34.0)
MCV: 91.5 fL (ref 78.0–100.0)
Platelets: 332 10*3/uL (ref 150–400)
RBC: 4.46 MIL/uL (ref 4.22–5.81)
RDW: 13.3 % (ref 11.5–15.5)
WBC: 7.8 10*3/uL (ref 4.0–10.5)

## 2013-07-25 LAB — BASIC METABOLIC PANEL
Calcium: 9.6 mg/dL (ref 8.4–10.5)
Creatinine, Ser: 1.12 mg/dL (ref 0.50–1.35)
GFR calc Af Amer: 74 mL/min — ABNORMAL LOW (ref >90)
GFR calc non Af Amer: 64 mL/min — ABNORMAL LOW (ref >90)
Glucose, Bld: 127 mg/dL — ABNORMAL HIGH (ref 70–99)
Potassium: 3.3 mEq/L — ABNORMAL LOW (ref 3.5–5.1)
Sodium: 130 mEq/L — ABNORMAL LOW (ref 135–145)

## 2013-07-25 LAB — MAGNESIUM: Magnesium: 2.2 mg/dL (ref 1.5–2.5)

## 2013-07-25 MED ORDER — POTASSIUM CHLORIDE 10 MEQ/100ML IV SOLN
10.0000 meq | INTRAVENOUS | Status: AC
  Administered 2013-07-25 (×2): 10 meq via INTRAVENOUS
  Filled 2013-07-25 (×3): qty 100

## 2013-07-25 NOTE — Progress Notes (Signed)
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 TRUS volume 193gm with large median lobe. Path T3bN0Mx with multifocal positive margins including bladder neck, seminal vesicals. Course complicated by urine leak that resolved with prolonged vented suction drainage as confirmed by cystogram last week.    2 - Small Bowel Obstruction - Pt with new small bowel obstruction by CT 12/17 durnig admission for n/v. No abscess, recurrent leak, bowel perforation. He did not have any direct bowel surgery as part of his prior procedure. Current JP drain felt to be possible nidus and therefore removed as no longer necessary.  Today Keith Bates is stable. No new flatus or BM. Abd remains distended. No interval fevers.     Objective: Vital signs in last 24 hours: Temp:  [98.1 F (36.7 C)-98.7 F (37.1 C)] 98.1 F (36.7 C) (12/18 0650) Pulse Rate:  [79-101] 79 (12/18 0650) Resp:  [16-20] 20 (12/18 0650) BP: (136-145)/(61-90) 136/61 mmHg (12/18 0650) SpO2:  [96 %-98 %] 96 % (12/18 0650) Last BM Date: 07/21/13  Intake/Output from previous day: 12/17 0701 - 12/18 0700 In: 1642.5 [I.V.:1642.5] Out: 175 [Emesis/NG output:175] Intake/Output this shift:    General appearance: alert, cooperative and appears stated age Head: Normocephalic, without obvious abnormality, atraumatic Eyes: conjunctivae/corneas clear. PERRL, EOM's intact. Fundi benign. Ears: normal TM's and external ear canals both ears Nose: Nares normal. Septum midline. Mucosa normal. No drainage or sinus tenderness., NGT in place wtih bilious efflux Throat: lips, mucosa, and tongue normal; teeth and gums normal Neck: no adenopathy, no carotid bruit, no JVD, supple, symmetrical, trachea midline and thyroid not enlarged, symmetric, no tenderness/mass/nodules Back: symmetric, no curvature. ROM normal.  No CVA tenderness. Resp: clear to auscultation bilaterally Chest wall: no tenderness Cardio: regular rate and rhythm, S1, S2 normal, no murmur, click, rub or gallop GI: distended and tympanic. No reboung / guarding. Prior JP site c/d/i no drainage. Male genitalia: normal Extremities: extremities normal, atraumatic, no cyanosis or edema Pulses: 2+ and symmetric Skin: Skin color, texture, turgor normal. No rashes or lesions Lymph nodes: Cervical, supraclavicular, and axillary nodes normal. Neurologic: Grossly normal Incision/Wound: Recent incision sites c/d/i. No hernias.  Lab Results:   Recent Labs  07/24/13 0501 07/25/13 0450  WBC 8.9 7.8  HGB 13.7 13.7  HCT 40.3 40.8  PLT 344 332   BMET  Recent Labs  07/24/13 0501 07/25/13 0450  NA 136 130*  K 3.4* 3.3*  CL 93* 92*  CO2 30 27  GLUCOSE 110* 127*  BUN 21 19  CREATININE 1.14 1.12  CALCIUM 9.9 9.6   PT/INR No results found for this basename: LABPROT, INR,  in the last 72 hours ABG No results found for this basename: PHART, PCO2, PO2, HCO3,  in the last 72 hours  Studies/Results: Ct Abdomen Pelvis W Contrast  07/24/2013   CLINICAL DATA:  Small bowel obstruction.  Prostate cancer.  EXAM: CT ABDOMEN AND PELVIS WITH CONTRAST  TECHNIQUE: Multidetector CT imaging of the abdomen and pelvis was performed using the standard protocol following bolus administration of intravenous contrast.  CONTRAST:  OMNIPAQUE IOHEXOL 300 MG/ML  SOLN  COMPARISON:  CT scan dated 07/01/2013  FINDINGS: Slight atelectasis at the lung bases has almost completely resolved.  There is a small bowel obstruction in the right lower quadrant. The small bowel is obstructed immediately anterior to the draining which is crossing under the point  of obstruction. This is best seen on image number 49 of series 603.  The liver, biliary tree, spleen, pancreas, adrenal glands, and kidneys are normal.  The colon is not distended.  Catheter has been removed from the  bladder. Bladder wall is somewhat thickened. There is no residual free fluid in the abdomen or pelvis.  IMPRESSION: 1. Small bowel obstruction immediately adjacent to the drainage tube in the right lower quadrant. 2. There is no residual free fluid in the abdomen.   Electronically Signed   By: Geanie Cooley M.D.   On: 07/24/2013 13:23   Dg Abd Acute W/chest  07/23/2013   CLINICAL DATA:  Nausea and emesis  EXAM: ACUTE ABDOMEN SERIES (ABDOMEN 2 VIEW & CHEST 1 VIEW)  COMPARISON:  Chest radiograph June 27, 2013; abdomen July 18, 2013  FINDINGS: PA chest: Lungs are clear. Heart size and pulmonary vascularity are normal. No adenopathy. There is arthropathy in both shoulders.  Supine and upright abdomen: There are multiple loops of dilated small bowel with multiple air-fluid levels. The appearance is consistent with bowel obstruction. No free air. There are phleboliths in the pelvis.  IMPRESSION: Findings consistent with small bowel obstruction. No free air. No edema or consolidation.   Electronically Signed   By: Bretta Bang M.D.   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 No further intervention while in house.    2 - Small Bowel Obstruction - NO sig progress overnight after drain removal, but this may take some time.  If not making progress in next 24-48 hrs, would consider gen surg consult for opinion. Fortunately no abscess / perforation / leak noted on recent CT.   [facility], Keith Bates 07/25/2013

## 2013-07-25 NOTE — Progress Notes (Signed)
[facility] PROGRESS NOTE  Keith Bates IEP:[id number] DOB: [birth year] DOA: 07/23/2013 PCP: Park Pope, MD  Assessment/Plan  Small bowel obstruction recurrent. The patient has a small bowel obstruction when he was admitted to the hospital last month for prostatectomy which was complicated by bowel obstruction and urine leak. -  CT of abdomen and pelvis demonstrated small bowel obstruction with transition point at the location of the JP drain at the right lower quadrant. -  Urology assisted with removal of the JP drain -  continue NG to low intermittent suction -  Continue IV fluids, analgesia and antiemetics -  Ice chips only -  Await return of flatus  -  Had some bowel sounds today -  If slow to progress, will obtain surgery consultation  Hyponatremia Likely due to dehydration resolved with IV fluids -  Repeat in the morning  Hypokalemia, likely secondary to GI losses.  Start IVF with potassium  Leukocytosis Resolved.  Chest x-ray negative. 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, likely staph saprophyticus -  Continue ceftriaxone day 2   Tachycardia  resolving - Patient has history of atrial fibrillation but is currently in sinus rhythm  - DC telemetry -  continue IV fluids  de Status: Full  Family Communication: Patient alone today   Disposition Plan: Pending improvement in his small bowel obstruction   Consultants:  Urology   Procedures:  Removal of his JP drain on 12/17    CT of abdomen and pelvis on 12/17  Antibiotics:  Ceftriaxone from 12/17   HPI/Subjective:  Patient says that he feels fairly well, however he has not had flatus or bowel movement since 4 days ago.   he has been hearing some bowel sounds. Last night his NG tube became displaced and he had nausea and vomiting.    Objective: Filed Vitals:   07/24/13 1358 07/24/13 2216 07/25/13 0650 07/25/13 1410  BP: 136/90 145/86 136/61 140/87  Pulse: 101 90 79 94  Temp: 98.7 F (37.1 C) 98.4 F (36.9 C) 98.1 F (36.7 C) 98.5 F (36.9 C)  TempSrc: Oral Oral Oral Oral  Resp: 16 16 20 18   Height:      Weight:      SpO2: 98% 97% 96% 97%    Intake/Output Summary (Last 24 hours) at 07/25/13 1948 Last data filed at 07/25/13 1700  Gross per 24 hour  Intake 1642.5 ml  Output    175 ml  Net 1467.5 ml   Filed Weights   07/23/13 2132  Weight: 62.7 kg (138 lb 3.7 oz)    Exam:   General:   thin African American male, No acute distress  HEENT:  NCAT, MMM  Cardiovascular:  RRR, nl S1, S2 no mrg, 2+ pulses, warm extremities  Respiratory:  CTAB, no increased WOB  Abdomen:    Hypoactive but present  bowel sounds, mild distention, nontender to palpation  MSK:   Normal tone and bulk, no LEE  Neuro:  Grossly intact  Data Reviewed: Basic Metabolic Panel:  Recent Labs Lab 07/23/13 1245 07/24/13 0501 07/25/13 0450  NA 132* 136 130*  K 3.8 3.4* 3.3*  CL 86* 93* 92*  CO2 34* 30 27  GLUCOSE 128* 110* 127*  BUN 24* 21 19  CREATININE 1.34 1.14 1.12  CALCIUM 10.9* 9.9 9.6  MG  --   --  2.2   Liver Function Tests:  Recent Labs Lab 07/23/13 1245  AST 18  ALT 23  ALKPHOS 112  BILITOT 0.9  PROT 8.2  ALBUMIN 4.0    Recent Labs Lab 07/23/13 1245  LIPASE 48   No results found for this basename: AMMONIA,  in the last 168 hours CBC:  Recent Labs Lab 07/23/13 1245 07/24/13 0501 07/25/13 0450  WBC 10.9* 8.9 7.8  NEUTROABS 8.4*  --   --   HGB 14.9 13.7 13.7  HCT 43.3 40.3 40.8  MCV 91.4 91.2 91.5  PLT 374 344 332   Cardiac Enzymes: No results found for this basename: CKTOTAL, CKMB, CKMBINDEX, TROPONINI,  in the last 168 hours BNP (last 3 results) No results found for this basename: PROBNP,  in the last 8760 hours CBG: No results found for this basename: GLUCAP,  in the last 168  hours  Recent Results (from the past 240 hour(s))  URINE CULTURE     Status: None   Collection Time    07/24/13  6:22 AM      Result Value Range Status   Specimen Description URINE, CLEAN CATCH   Final   Special Requests NONE   Final   Culture  Setup Time     Final   Value: 07/24/2013 12:52     Performed at [facility] Count     Final   Value: >=100,000 COLONIES/ML     Performed at [facility]   Culture     Final   Value: STAPHYLOCOCCUS SPECIES (COAGULASE NEGATIVE)     Note: RIFAMPIN AND GENTAMICIN SHOULD NOT BE USED AS SINGLE DRUGS FOR TREATMENT OF STAPH INFECTIONS.     Performed at [facility]   Report Status PENDING   Incomplete     Studies: Ct Abdomen Pelvis W Contrast  07/24/2013   CLINICAL DATA:  Small bowel obstruction.  Prostate cancer.  EXAM: CT ABDOMEN AND PELVIS WITH CONTRAST  TECHNIQUE: Multidetector CT imaging of the abdomen and pelvis was performed using the standard protocol following bolus administration of intravenous contrast.  CONTRAST:  OMNIPAQUE IOHEXOL 300 MG/ML  SOLN  COMPARISON:  CT scan dated 07/01/2013  FINDINGS: Slight atelectasis at the lung bases has almost completely resolved.  There is a small bowel obstruction in the right lower quadrant. The small bowel is obstructed immediately anterior to the draining which is crossing under the point of obstruction. This is best seen on image number 49 of series 603.  The liver, biliary tree, spleen, pancreas, adrenal glands, and kidneys are normal.  The colon is not distended.  Catheter has been removed from the bladder. Bladder wall is somewhat thickened. There is no residual free fluid in the abdomen or pelvis.  IMPRESSION: 1. Small bowel obstruction immediately adjacent to the drainage tube in the right lower quadrant. 2. There is no residual free fluid in the abdomen.   Electronically Signed   By: Geanie Cooley M.D.   On: 07/24/2013 13:23    Scheduled Meds: . cefTRIAXone  (ROCEPHIN)  IV  1 g Intravenous Q24H  . enoxaparin (LOVENOX) injection  40 mg Subcutaneous Q24H  . fluticasone  2 spray Each Nare Daily  . pantoprazole (PROTONIX) IV  40 mg Intravenous Q12H  . sodium chloride  3 mL Intravenous Q12H   Continuous Infusions: . dextrose 5 % and 0.45 % NaCl with KCl 20 mEq/L 75 mL/hr at 07/24/13 2112    Active Problems:   Small  bowel obstruction   SBO (small bowel obstruction)   UTI (urinary tract infection)   Hypokalemia    Time spent: 30 min    Sabeen Piechocki  [facility] Pager [phone]. If 7PM-7AM, please contact night-coverage at [email or web address], password [facility] 07/25/2013, 7:48 PM  LOS: 2 days

## 2013-07-26 ENCOUNTER — Encounter (HOSPITAL_COMMUNITY): Payer: Self-pay | Admitting: General Surgery

## 2013-07-26 ENCOUNTER — Inpatient Hospital Stay (HOSPITAL_COMMUNITY): Payer: Medicare Other

## 2013-07-26 DIAGNOSIS — K56609 Unspecified intestinal obstruction, unspecified as to partial versus complete obstruction: Secondary | ICD-10-CM

## 2013-07-26 LAB — CBC
HCT: 40.8 % (ref 39.0–52.0)
Hemoglobin: 13.7 g/dL (ref 13.0–17.0)
RBC: 4.5 MIL/uL (ref 4.22–5.81)
RDW: 13.1 % (ref 11.5–15.5)
WBC: 7.9 10*3/uL (ref 4.0–10.5)

## 2013-07-26 LAB — BASIC METABOLIC PANEL
BUN: 15 mg/dL (ref 6–23)
Calcium: 9.5 mg/dL (ref 8.4–10.5)
Chloride: 95 mEq/L — ABNORMAL LOW (ref 96–112)
Creatinine, Ser: 0.93 mg/dL (ref 0.50–1.35)
GFR calc Af Amer: >90 mL/min (ref >90)
GFR calc non Af Amer: 83 mL/min — ABNORMAL LOW (ref >90)
Potassium: 3.9 mEq/L (ref 3.5–5.1)
Sodium: 132 mEq/L — ABNORMAL LOW (ref 135–145)

## 2013-07-26 LAB — URINE CULTURE: Colony Count: >=100000

## 2013-07-26 MED ORDER — INSULIN ASPART 100 UNIT/ML ~~LOC~~ SOLN
0.0000 [IU] | SUBCUTANEOUS | Status: DC
  Administered 2013-07-27: 1 [IU] via SUBCUTANEOUS
  Administered 2013-07-28: 20:00:00 via SUBCUTANEOUS
  Administered 2013-07-28 – 2013-07-30 (×5): 1 [IU] via SUBCUTANEOUS

## 2013-07-26 MED ORDER — SODIUM CHLORIDE 0.9 % IJ SOLN
10.0000 mL | INTRAMUSCULAR | Status: DC | PRN
  Administered 2013-07-28 – 2013-07-31 (×5): 10 mL

## 2013-07-26 MED ORDER — TRACE MINERALS CR-CU-F-FE-I-MN-MO-SE-ZN IV SOLN
INTRAVENOUS | Status: AC
  Administered 2013-07-27: 18:00:00 via INTRAVENOUS
  Filled 2013-07-26: qty 1000

## 2013-07-26 MED ORDER — SODIUM CHLORIDE 0.9 % IV SOLN
INTRAVENOUS | Status: DC
  Administered 2013-07-27: 40 mL/h via INTRAVENOUS
  Administered 2013-07-30: 04:00:00 via INTRAVENOUS

## 2013-07-26 MED ORDER — FAT EMULSION 20 % IV EMUL
250.0000 mL | INTRAVENOUS | Status: AC
  Administered 2013-07-27: 18:00:00 250 mL via INTRAVENOUS
  Filled 2013-07-26: qty 250

## 2013-07-26 MED ORDER — VANCOMYCIN HCL IN DEXTROSE 750-5 MG/150ML-% IV SOLN
750.0000 mg | Freq: Once | INTRAVENOUS | Status: AC
  Administered 2013-07-26: 21:00:00 750 mg via INTRAVENOUS
  Filled 2013-07-26: qty 150

## 2013-07-26 MED ORDER — VANCOMYCIN HCL IN DEXTROSE 750-5 MG/150ML-% IV SOLN
750.0000 mg | Freq: Two times a day (BID) | INTRAVENOUS | Status: DC
  Administered 2013-07-27 – 2013-07-28 (×3): 750 mg via INTRAVENOUS
  Filled 2013-07-26 (×3): qty 150

## 2013-07-26 NOTE — Progress Notes (Signed)
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 Plasticized Base [Plastibase] Rash  . Penicillins Rash    Patient Measurements: Height: 5\' 7"  (170.2 cm) Weight: 138 lb 3.7 oz (62.7 kg) IBW/kg (Calculated) : 66.1  Vital Signs: Temp: 98.1 F (36.7 C) (12/19 1500) Temp src: Oral (12/19 1500) BP: 149/97 mmHg (12/19 1500) Pulse Rate: 94 (12/19 1500) Intake/Output from previous day: 12/18 0701 - 12/19 0700 In: 900 [I.V.:900] Out: 350 [Emesis/NG output:350] Intake/Output from this shift: Total I/O In: -  Out: 700 [Urine:100; Emesis/NG output:600]  Labs:  Recent Labs  07/24/13 0501 07/25/13 0450 07/26/13 0540  WBC 8.9 7.8 7.9  HGB 13.7 13.7 13.7  PLT 344 332 312  CREATININE 1.14 1.12 0.93   Estimated Creatinine Clearance: 64.6 ml/min (by C-G formula based on Cr of 0.93).   Microbiology: Recent Results (from the past 720 hour(s))  CULTURE, BLOOD (ROUTINE X 2)     Status: None   Collection Time    06/27/13  2:00 AM      Result Value Range Status   Specimen Description BLOOD RIGHT ANTECUBITAL   Final   Special Requests BOTTLES DRAWN AEROBIC AND ANAEROBIC 3CC   Final   Culture  Setup Time     Final   Value: 06/27/2013 08:59     Performed at [facility]   Culture     Final   Value: NO GROWTH 5 DAYS     Performed at [facility]   Report Status 07/03/2013 FINAL   Final  CULTURE, BLOOD (ROUTINE X 2)     Status: None   Collection Time    06/27/13  2:10 AM      Result Value Range Status   Specimen Description BLOOD LEFT HAND   Final   Special Requests BOTTLES DRAWN AEROBIC AND ANAEROBIC 5CC   Final   Culture  Setup Time     Final   Value: 06/27/2013 08:57     Performed at [facility]   Culture     Final   Value: NO GROWTH 5 DAYS     Performed at [facility]    Report Status 07/03/2013 FINAL   Final  MRSA PCR SCREENING     Status: None   Collection Time    06/27/13  3:46 AM      Result Value Range Status   MRSA by PCR NEGATIVE  NEGATIVE Final   Comment:            The GeneXpert MRSA Assay (FDA     approved for NASAL specimens     only), is one component of a     comprehensive MRSA colonization     surveillance program. It is not     intended to diagnose MRSA     infection nor to guide or     monitor treatment for     MRSA infections.  URINE CULTURE     Status: None   Collection Time    06/27/13  8:30 AM      Result Value Range Status   Specimen Description URINE, CLEAN CATCH   Final   Special Requests NONE   Final   Culture  Setup Time     Final   Value: 06/27/2013 14:34     Performed at [facility]  Colony Count     Final   Value: NO GROWTH     Performed at [facility]   Culture     Final   Value: NO GROWTH     Performed at [facility]   Report Status 06/28/2013 FINAL   Final  URINE CULTURE     Status: None   Collection Time    07/08/13  9:23 AM      Result Value Range Status   Specimen Description URINE, CATHETERIZED   Final   Special Requests NONE   Final   Culture  Setup Time     Final   Value: 07/08/2013 13:05     Performed at [facility] Count     Final   Value: >=100,000 COLONIES/ML     Performed at [facility]   Culture     Final   Value: KLEBSIELLA PNEUMONIAE     ENTEROCOCCUS SPECIES     Performed at [facility]   Report Status 07/11/2013 FINAL   Final   Organism ID, Bacteria KLEBSIELLA PNEUMONIAE   Final   Organism ID, Bacteria ENTEROCOCCUS SPECIES   Final  URINE CULTURE     Status: None   Collection Time    07/24/13  6:22 AM      Result Value Range Status   Specimen Description URINE, CLEAN CATCH   Final   Special Requests NONE   Final   Culture  Setup Time     Final   Value: 07/24/2013 12:52     Performed at [facility] Count     Final   Value: >=100,000 COLONIES/ML     Performed at [facility]   Culture     Final   Value: STAPHYLOCOCCUS SPECIES (COAGULASE NEGATIVE)     Note: RIFAMPIN AND GENTAMICIN SHOULD NOT BE USED AS SINGLE DRUGS FOR TREATMENT OF STAPH INFECTIONS.     Performed at [facility]   Report Status 07/26/2013 FINAL   Final   Organism ID, Bacteria STAPHYLOCOCCUS SPECIES (COAGULASE NEGATIVE)   Final    Medical History: Past Medical History  Diagnosis Date  . Enlarged prostate   . Prostate cancer   . Unspecified hemorrhoids without mention of complication   . Loss of weight   . GERD (gastroesophageal reflux disease)   . Environmental allergies   . Arthritis   . Hx of ulcer disease   . Nocturia   . DDD (degenerative disc disease)     Assessment: 41 yoM admitted 12/16 for SBO, now with UTI. Pharmacy consulted to dose vancomycin for CoNS UTI- MDR and inability to take PO medications.   Patient with recent Klebsiella and enterococcus UTI on Bactrim x 10 days starting 12/2  Tmax: 99.4 WBCs: WNL Renal: SCr 0.93, CrCl 65 mL/min (GC), 74 (N)  Antiinfectives: 12/17 >> ceftriaxone >> 12/19 12/19 >> vanc >>  Microbiology: 12/17 urine: coagulase negative staph (S=nitrofurantoin, vancomycin)   Goal of Therapy:  Vancomycin trough level 10-15 mcg/ml  Plan:  - vancomycin 750mg  IV q12h - vancomycin trough at steady state if indicated - follow-up clinical course, culture results, renal function - follow-up antibiotic de-escalation and length of therapy   Thank you for the consult.  Tomi Bamberger, PharmD, BCPS Clinical Pharmacist Pager: [phone] Pharmacy: [phone] 07/26/2013 8:12 PM

## 2013-07-26 NOTE — Progress Notes (Signed)
Nasogastric tube advanced.

## 2013-07-26 NOTE — Progress Notes (Signed)
Peripherally Inserted Central Catheter/Midline Placement  The IV Nurse has discussed with the patient and/or persons authorized to consent for the patient, the purpose of this procedure and the potential benefits and risks involved with this procedure.  The benefits include less needle sticks, lab draws from the catheter and patient may be discharged home with the catheter.  Risks include, but not limited to, infection, bleeding, blood clot (thrombus formation), and puncture of an artery; nerve damage and irregular heat beat.  Alternatives to this procedure were also discussed.  PICC/Midline Placement Documentation  PICC / Midline Double Lumen 07/26/13 PICC Right Basilic 43 cm 1 cm (Active)  Indication for Insertion or Continuance of Line Administration of hyperosmolar/irritating solutions (i.e. TPN, Vancomycin, etc.) 07/26/2013  6:00 PM  Exposed Catheter (cm) 1 cm 07/26/2013  6:00 PM  Dressing Change Due 08/02/13 07/26/2013  6:00 PM       Stacie Glaze Horton 07/26/2013, 6:09 PM

## 2013-07-26 NOTE — Consult Note (Signed)
[employer] 30-Nov-1940  [id number].   Requesting MD: Dr. Renae Fickle Chief Complaint/Reason for Consult: SBO HPI: This is a 72 yo black male with T3N0Mx prostate cancer who underwent robotic radical laparoscopic assisted prostatectomy with bilateral pelvic lymphadenectomy on 06/21/13 by Dr. Berneice Heinrich.  He ended up with a urine leak which was healed with time and management with a JP drain.  He underwent a CT cystogram last week that confirmed resolution of this leak.  He had a prolonged hospital course due to a post op ileus as well and was just discharged from the hospital 17 days ago from today.  This past Sunday, he developed abdominal pain, bloating, nausea, and vomiting.  He then presented to [facility] several days later where he was admitted for management of a bowel obstruction.  He had a CT scan that revealed a bowel obstruction with possible transition point around the JP drain.  Dr. Berneice Heinrich was consulted and removed the JP drain, given no further leak, to see if this would help.  It has not.  His bowel obstruction has persisted.  We have been asked to see him today for further evaluation and recommendations.  ROS: Please see HPI, otherwise all other systems are negative.  Family History  Problem Relation Age of Onset  . Prostate cancer Father     with mets to nodes  . Cancer Brother     colon and prostate  . Heart attack Mother     Past Medical History  Diagnosis Date  . Enlarged prostate   . Prostate cancer   . Unspecified hemorrhoids without mention of complication   . Loss of weight   . GERD (gastroesophageal reflux disease)   . Environmental allergies   . Arthritis   . Hx of ulcer disease   . Nocturia   . DDD (degenerative disc disease)     Past Surgical History  Procedure Laterality Date  . Hemorrhoid banding    . Colonoscopy  03/13/2013  . Robot assisted laparoscopic radical prostatectomy N/A 06/21/2013    Procedure: ROBOTIC ASSISTED LAPAROSCOPIC RADICAL PROSTATECTOMY;   Surgeon: Sebastian Ache, MD;  Location: [facility];  Service: Urology;  Laterality: N/A;  . Lymphadenectomy Bilateral 06/21/2013    Procedure: LYMPHADENECTOMY  " BILATERAL PELVIC LYMPH NODE DISSECTION ;  Surgeon: Sebastian Ache, MD;  Location: [facility];  Service: Urology;  Laterality: Bilateral;    Social History:  reports that he quit smoking about 41 years ago. His smoking use included Cigarettes. He has a 15 pack-year smoking history. He has never used smokeless tobacco. He reports that he does not drink alcohol or use illicit drugs.  Allergies:  Allergies  Allergen Reactions  . 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 Plasticized Base [Plastibase] Rash  . Penicillins Rash    Medications Prior to Admission  Medication Sig Dispense Refill  . fluticasone (FLONASE) 50 MCG/ACT nasal spray Place 2 sprays into the nose daily.      Marland Kitchen HYDROcodone-acetaminophen (NORCO) 5-325 MG per tablet Take 1-2 tablets by mouth every 6 (six) hours as needed.  30 tablet  0  . loratadine (CLARITIN) 10 MG tablet Take 10 mg by mouth daily.      Marland Kitchen omeprazole (PRILOSEC) 20 MG capsule Take 20 mg by mouth daily.      Marland Kitchen sulfamethoxazole-trimethoprim (BACTRIM DS) 800-160 MG per tablet Take 1 tablet by mouth 2 (two) times daily. To prevent infection  40 tablet  0  . ciprofloxacin (CIPRO)  500 MG tablet Take 1 tablet (500 mg total) by mouth 2 (two) times daily. Start day prior to office visit for foley removal  6 tablet  0    Blood pressure 148/91, pulse 96, temperature 98.3 F (36.8 C), temperature source Oral, resp. rate 16, height 5\' 7"  (1.702 m), weight 138 lb 3.7 oz (62.7 kg), SpO2 98.00%. Physical Exam: General: pleasant, WD, WN black male who is laying in bed in NAD HEENT: head is normocephalic, atraumatic.  Sclera are noninjected.  PERRL.  Ears and nose without any masses or lesions.  NGT in place with bilious output.  Mouth is dry. Heart: regular, rate, and rhythm.  Normal s1,s2. No obvious  murmurs, gallops, or rubs noted.  Palpable radial and pedal pulses bilaterally Lungs: CTAB, no wheezes, rhonchi, or rales noted.  Respiratory effort nonlabored Abd: soft, but distended with tympany.  NT.  Multiple well healed incisions are noted throughout his abdomen. no masses, hernias, or organomegaly MS: all 4 extremities are symmetrical with no cyanosis, clubbing, or edema. Skin: warm and dry with no masses, lesions, or rashes Psych: A&Ox3 with an appropriate affect.    Results for orders placed during the hospital encounter of 07/23/13 (from the past 48 hour(s))  BASIC METABOLIC PANEL     Status: Abnormal   Collection Time    07/25/13  4:50 AM      Result Value Range   Sodium 130 (*) 135 - 145 mEq/L   Potassium 3.3 (*) 3.5 - 5.1 mEq/L   Chloride 92 (*) 96 - 112 mEq/L   CO2 27  19 - 32 mEq/L   Glucose, Bld 127 (*) 70 - 99 mg/dL   BUN 19  6 - 23 mg/dL   Creatinine, Ser 1.61  0.50 - 1.35 mg/dL   Calcium 9.6  8.4 - 09.6 mg/dL   GFR calc non Af Amer 64 (*) >90 mL/min   GFR calc Af Amer 74 (*) >90 mL/min   Comment: (NOTE)     The eGFR has been calculated using the CKD EPI equation.     This calculation has not been validated in all clinical situations.     eGFR's persistently <90 mL/min signify possible Chronic Kidney     Disease.  CBC     Status: None   Collection Time    07/25/13  4:50 AM      Result Value Range   WBC 7.8  4.0 - 10.5 K/uL   RBC 4.46  4.22 - 5.81 MIL/uL   Hemoglobin 13.7  13.0 - 17.0 g/dL   HCT 04.5  40.9 - 81.1 %   MCV 91.5  78.0 - 100.0 fL   MCH 30.7  26.0 - 34.0 pg   MCHC 33.6  30.0 - 36.0 g/dL   RDW 91.4  78.2 - 95.6 %   Platelets 332  150 - 400 K/uL  MAGNESIUM     Status: None   Collection Time    07/25/13  4:50 AM      Result Value Range   Magnesium 2.2  1.5 - 2.5 mg/dL  BASIC METABOLIC PANEL     Status: Abnormal   Collection Time    07/26/13  5:40 AM      Result Value Range   Sodium 132 (*) 135 - 145 mEq/L   Potassium 3.9  3.5 - 5.1 mEq/L    Chloride 95 (*) 96 - 112 mEq/L   CO2 26  19 - 32 mEq/L   Glucose, Bld 129 (*)  70 - 99 mg/dL   BUN 15  6 - 23 mg/dL   Creatinine, Ser 1.61  0.50 - 1.35 mg/dL   Calcium 9.5  8.4 - 09.6 mg/dL   GFR calc non Af Amer 83 (*) >90 mL/min   GFR calc Af Amer >90  >90 mL/min   Comment: (NOTE)     The eGFR has been calculated using the CKD EPI equation.     This calculation has not been validated in all clinical situations.     eGFR's persistently <90 mL/min signify possible Chronic Kidney     Disease.  CBC     Status: None   Collection Time    07/26/13  5:40 AM      Result Value Range   WBC 7.9  4.0 - 10.5 K/uL   RBC 4.50  4.22 - 5.81 MIL/uL   Hemoglobin 13.7  13.0 - 17.0 g/dL   HCT 04.5  40.9 - 81.1 %   MCV 90.7  78.0 - 100.0 fL   MCH 30.4  26.0 - 34.0 pg   MCHC 33.6  30.0 - 36.0 g/dL   RDW 91.4  78.2 - 95.6 %   Platelets 312  150 - 400 K/uL   No results found.     Assessment/Plan 1. 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 T3N0Mx prostate cancer, s/p robotic radical laparoscopic assisted prostatectomy with bilateral pelvic lymphadenectomy on 06/21/13 by Dr. Berneice Heinrich  Plan: 1. The patient appears to have a complete SBO.  Unfortunately, removing his JP drain did not help his obstruction.  The patient is only 4-5 weeks from his original surgery date.  This is not an optimal time to proceed with surgical intervention as it puts the patient at high risk for complications such as bowel injuries and fistula formation.  For now, we will insert a PICC line and start TNA for nutritional support.  It's very likely that we would give the patient anywhere from 7-10 days more of bowel rest to get him further out from surgery before proceeding with surgical intervention if he does not resolved on his own.  Dr. Ezzard Standing and Dr. Berneice Heinrich have discussed the patient as well.  I have also discussed the patient with Dr. Malachi Bonds and informed her we plan on starting TNA.  We will repeat abdominal films and  follow closely.  OSBORNE,KELLY E 07/26/2013, 2:38 PM Pager: [phone]  Agree with above. KUB shows NGT not in the correct position, so this needs to be advanced.  Plan conservative management for now.  Ovidio Kin, MD, [facility] Pager: [phone] Office phone:  [phone]

## 2013-07-26 NOTE — Progress Notes (Signed)
[facility] PROGRESS NOTE  Keith Bates ZOX:[id number] DOB: 1940-08-17 DOA: 07/23/2013 PCP: Park Pope, MD  Assessment/Plan  Small bowel obstruction recurrent. The patient has a small bowel obstruction when he was admitted to the hospital last month for prostatectomy which was complicated by bowel obstruction and urine leak.  CT of abdomen and pelvis demonstrated small bowel obstruction with transition point at the location of the JP drain at the right lower quadrant.  The JP drain was removed by urology, however, the patient continues to suffer from bowel obstruction. -  General Surgery consult:  Appreciate recommendations -  Agree with PICC and TPN and prolonged bowel rest -  Urology assisted with removal of the JP drain -  continue NG to low intermittent suction  Hyponatremia  Likely iatrogenic due to IVF -  Repeat in the morning -  Starting TPN  Hypokalemia, likely secondary to GI losses.  Resolved with IV supplementation  Leukocytosis Resolved.  Chest x-ray negative. 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, MDR -  Start vancomycin day 1 of tx -  Repeat UA and culture today  Tachycardia  resolving - Patient has history of atrial fibrillation but is currently in sinus rhythm   Code Status: Full  Family Communication: Patient and sister Disposition Plan: Pending improvement in his small bowel obstruction   Consultants:  Urology   Procedures:  Removal of his JP drain on 12/17    CT of abdomen and pelvis on 12/17  Antibiotics:  Ceftriaxone from 12/17 >> 12/19  HPI/Subjective:  Patient says bowels have been quiet, no flatus, still distended and somewhat tender  Objective: Filed Vitals:   07/25/13 1410 07/25/13 2246 07/26/13 0654 07/26/13 1500  BP: 140/87 154/92 148/91 149/97  Pulse:  94 106 96 94  Temp: 98.5 F (36.9 C) 99.4 F (37.4 C) 98.3 F (36.8 C) 98.1 F (36.7 C)  TempSrc: Oral Oral Oral Oral  Resp: 18 20 16 18   Height:      Weight:      SpO2: 97% 98% 98% 95%    Intake/Output Summary (Last 24 hours) at 07/26/13 1926 Last data filed at 07/26/13 1908  Gross per 24 hour  Intake    900 ml  Output    950 ml  Net    -50 ml   Filed Weights   07/23/13 2132  Weight: 62.7 kg (138 lb 3.7 oz)    Exam:   General:   Thin African American male, No acute distress  HEENT:  NCAT, MMM  Cardiovascular:  RRR, nl S1, S2 no mrg, 2+ pulses, warm extremities  Respiratory:  CTAB, no increased WOB  Abdomen:    Hypoactive but present bowel sounds, high pitched, mild to moderate distention, nontender to palpation  MSK:   Normal tone and bulk, no LEE  Neuro:  Grossly intact  Data Reviewed: Basic Metabolic Panel:  Recent Labs Lab 07/23/13 1245 07/24/13 0501 07/25/13 0450 07/26/13 0540  NA 132* 136 130* 132*  K 3.8 3.4* 3.3* 3.9  CL 86* 93* 92* 95*  CO2 34* 30 27 26   GLUCOSE 128* 110* 127* 129*  BUN 24* 21 19 15   CREATININE 1.34 1.14 1.12 0.93  CALCIUM 10.9* 9.9 9.6 9.5  MG  --   --  2.2  --    Liver Function Tests:  Recent  Labs Lab 07/23/13 1245  AST 18  ALT 23  ALKPHOS 112  BILITOT 0.9  PROT 8.2  ALBUMIN 4.0    Recent Labs Lab 07/23/13 1245  LIPASE 48   No results found for this basename: AMMONIA,  in the last 168 hours CBC:  Recent Labs Lab 07/23/13 1245 07/24/13 0501 07/25/13 0450 07/26/13 0540  WBC 10.9* 8.9 7.8 7.9  NEUTROABS 8.4*  --   --   --   HGB 14.9 13.7 13.7 13.7  HCT 43.3 40.3 40.8 40.8  MCV 91.4 91.2 91.5 90.7  PLT 374 344 332 312   Cardiac Enzymes: No results found for this basename: CKTOTAL, CKMB, CKMBINDEX, TROPONINI,  in the last 168 hours BNP (last 3 results) No results found for this basename: PROBNP,  in the last 8760 hours CBG: No results found for this basename: GLUCAP,  in the last 168  hours  Recent Results (from the past 240 hour(s))  URINE CULTURE     Status: None   Collection Time    07/24/13  6:22 AM      Result Value Range Status   Specimen Description URINE, CLEAN CATCH   Final   Special Requests NONE   Final   Culture  Setup Time     Final   Value: 07/24/2013 12:52     Performed at [facility] Count     Final   Value: >=100,000 COLONIES/ML     Performed at [facility]   Culture     Final   Value: STAPHYLOCOCCUS SPECIES (COAGULASE NEGATIVE)     Note: RIFAMPIN AND GENTAMICIN SHOULD NOT BE USED AS SINGLE DRUGS FOR TREATMENT OF STAPH INFECTIONS.     Performed at [facility]   Report Status 07/26/2013 FINAL   Final   Organism ID, Bacteria STAPHYLOCOCCUS SPECIES (COAGULASE NEGATIVE)   Final     Studies: Dg Abd 2 Views  07/26/2013   CLINICAL DATA:  Evaluate small bowel obstruction. Abdominal pain and distension.  EXAM: ABDOMEN - 2 VIEW  COMPARISON:  07/23/2013  FINDINGS: Nasogastric tube tip is in the distal esophagus. The stomach is distended and contains gas. Evidence for dilated loops of small bowel but the small bowel loops contain more fluid than air at this time. Air-fluid levels within the small bowel. No significant colonic gas.  IMPRESSION: The degree of small bowel distension has minimally changed but there is now more fluid within the small bowel loops. Findings remain compatible with a bowel obstruction.  Nasogastric tube tip in the distal esophagus. Nasogastric tube tip location was discussed with the patient's nurse, Amil Amen, on 07/26/2013 at 3:58 p.m.   Electronically Signed   By: Richarda Overlie M.D.   On: 07/26/2013 15:59    Scheduled Meds: . cefTRIAXone (ROCEPHIN)  IV  1 g Intravenous Q24H  . enoxaparin (LOVENOX) injection  40 mg Subcutaneous Q24H  . fluticasone  2 spray Each Nare Daily  . [START ON 07/27/2013] insulin aspart  0-9 Units Subcutaneous Q4H  . pantoprazole (PROTONIX) IV  40 mg Intravenous Q12H  . sodium  chloride  3 mL Intravenous Q12H   Continuous Infusions: . [START ON 07/27/2013] sodium chloride    . dextrose 5 % and 0.45 % NaCl with KCl 20 mEq/L 75 mL/hr at 07/26/13 1450  . [START ON 07/27/2013] .TPN (CLINIMIX-E) Adult     And  . [START ON 07/27/2013] fat emulsion      Active Problems:   Small bowel  obstruction   SBO (small bowel obstruction)   UTI (urinary tract infection)   Hypokalemia    Time spent: 30 min    Korryn Pancoast, [facility]  [facility] Pager [phone]. If 7PM-7AM, please contact night-coverage at [email or web address], password [facility] 07/26/2013, 7:26 PM  LOS: 3 days

## 2013-07-26 NOTE — Progress Notes (Signed)
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 TRUS volume 193gm with large median lobe. Path [id number] with multifocal positive margins including bladder neck, seminal vesicals. Course complicated by urine leak that resolved with prolonged vented suction drainage as confirmed by cystogram last week.    2 - Small Bowel Obstruction - Pt with new small bowel obstruction by CT 12/17 durnig admission for n/v. No abscess, recurrent leak, bowel perforation. He did not have any direct bowel surgery as part of his prior procedure. Current JP drain felt to be possible nidus and therefore removed as no longer necessary. General surgery consult 08/17/23 and reccomends conservative measures with continued bowel rest + TPN.  Today Keith Bates is stable. No new flatus or BM. Abd remains distended. No interval fevers. NGT now working much better after repostioning  Objective: Vital signs in last 24 hours: Temp:  [98.1 F (36.7 C)-99.4 F (37.4 C)] 98.1 F (36.7 C) 17-Aug-2023 1500) Pulse Rate:  [94-106] 94 2023-08-17 1500) Resp:  [16-20] 18 08/17/23 1500) BP: (148-154)/(91-97) 149/97 mmHg 08-17-2023 1500) SpO2:  [95 %-98 %] 95 % 08-17-23 1500) Last BM Date: 07/21/13  Intake/Output from previous day: 12/18 0701 08-17-23 0700 In: 900 [I.V.:900] Out: 350 [Emesis/NG output:350] Intake/Output this shift:    General appearance: alert, cooperative, appears stated age and wife at bedside Head: Normocephalic, without obvious abnormality, atraumatic Eyes: conjunctivae/corneas clear. PERRL, EOM's intact. Fundi benign. Ears: normal TM's and external ear canals both ears Nose: Nares normal. Septum midline. Mucosa normal. No drainage or sinus tenderness. Throat: lips, mucosa, and tongue normal; teeth and gums normal Neck: no adenopathy, no carotid bruit, no JVD, supple, symmetrical,  trachea midline and thyroid not enlarged, symmetric, no tenderness/mass/nodules Back: symmetric, no curvature. ROM normal. No CVA tenderness. Resp: clear to auscultation bilaterally Chest wall: no tenderness Cardio: regular rate and rhythm, S1, S2 normal, no murmur, click, rub or gallop GI: stabley distended abd. Old JP site now healed over. All recent prior incision sites c/d/i. Male genitalia: normal Extremities: extremities normal, atraumatic, no cyanosis or edema Pulses: 2+ and symmetric Skin: Skin color, texture, turgor normal. No rashes or lesions Lymph nodes: Cervical, supraclavicular, and axillary nodes normal. Neurologic: Grossly normal Incision/Wound: NGT with copios bilious efflux.  Lab Results:   Recent Labs  07/25/13 0450 Aug 16, 2013 0540  WBC 7.8 7.9  HGB 13.7 13.7  HCT 40.8 40.8  PLT 332 312   BMET  Recent Labs  07/25/13 0450 August 16, 2013 0540  NA 130* 132*  K 3.3* 3.9  CL 92* 95*  CO2 27 26  GLUCOSE 127* 129*  BUN 19 15  CREATININE 1.12 0.93  CALCIUM 9.6 9.5   PT/INR No results found for this basename: LABPROT, INR,  in the last 72 hours ABG No results found for this basename: PHART, PCO2, PO2, HCO3,  in the last 72 hours  Studies/Results: Dg Abd 2 Views  08/16/2013   CLINICAL DATA:  Evaluate small bowel obstruction. Abdominal pain and distension.  EXAM: ABDOMEN - 2 VIEW  COMPARISON:  07/23/2013  FINDINGS: Nasogastric tube tip is in the distal esophagus. The stomach is distended and contains gas. Evidence for dilated loops of small bowel but the small bowel loops contain more fluid than air at this time. Air-fluid levels within the small bowel. No significant colonic gas.  IMPRESSION: The degree of small bowel distension has minimally changed but there is  now more fluid within the small bowel loops. Findings remain compatible with a bowel obstruction.  Nasogastric tube tip in the distal esophagus. Nasogastric tube tip location was discussed with the patient's  nurse, Amil Amen, on 07/26/2013 at 3:58 p.m.   Electronically Signed   By: Richarda Overlie M.D.   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 No further intervention while in house.    2 - Small Bowel Obstruction - Agree with initial conservative measures as recommended by general surgery and hospitalist team, appreciate. Should pt require abdominal exploration I am happy to assist in anyway. I will be out of the office next week but will be consult rounding prn but am available by phone anytime.   [facility], Leemon Ayala 07/26/2013

## 2013-07-26 NOTE — Progress Notes (Addendum)
PARENTERAL NUTRITION CONSULT NOTE - INITIAL  Pharmacy Consult for TNA  Indication: SBO  Allergies  Allergen Reactions  . 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 Plasticized Base [Plastibase] Rash  . Penicillins Rash    Patient Measurements: Height: 5\' 7"  (170.2 cm) Weight: 138 lb 3.7 oz (62.7 kg) IBW/kg (Calculated) : 66.1 Usual Weight: 71.4 kg  Vital Signs: Temp: 98.3 F (36.8 C) (12/19 0654) Temp src: Oral (12/19 0654) BP: 148/91 mmHg (12/19 0654) Pulse Rate: 96 (12/19 0654) Intake/Output from previous day: 12/18 0701 - 12/19 0700 In: 900 [I.V.:900] Out: 350 [Emesis/NG output:350]  Labs:  Recent Labs  07/24/13 0501 07/25/13 0450 07/26/13 0540  WBC 8.9 7.8 7.9  HGB 13.7 13.7 13.7  HCT 40.3 40.8 40.8  PLT 344 332 312     Recent Labs  07/24/13 0501 07/25/13 0450 07/26/13 0540  NA 136 130* 132*  K 3.4* 3.3* 3.9  CL 93* 92* 95*  CO2 30 27 26   GLUCOSE 110* 127* 129*  BUN 21 19 15   CREATININE 1.14 1.12 0.93  CALCIUM 9.9 9.6 9.5  MG  --  2.2  --    Estimated Creatinine Clearance: 64.6 ml/min (by C-G formula based on Cr of 0.93).   No results found for this basename: GLUCAP,  in the last 72 hours  Medical History: Past Medical History  Diagnosis Date  . Enlarged prostate   . Prostate cancer   . Unspecified hemorrhoids without mention of complication   . Loss of weight   . GERD (gastroesophageal reflux disease)   . Environmental allergies   . Arthritis   . Hx of ulcer disease   . Nocturia   . DDD (degenerative disc disease)     Medications:  Scheduled:  . cefTRIAXone (ROCEPHIN)  IV  1 g Intravenous Q24H  . enoxaparin (LOVENOX) injection  40 mg Subcutaneous Q24H  . fluticasone  2 spray Each Nare Daily  . pantoprazole (PROTONIX) IV  40 mg Intravenous Q12H  . sodium chloride  3 mL Intravenous Q12H   Infusions:  . dextrose 5 % and 0.45 % NaCl with KCl 20 mEq/L 75 mL/hr at 07/26/13 1450    Insulin Requirements in the past  24 hours:  No insulin ordered  Current Nutrition:  NPO MIVF: D5W 1/2 NS with KCl 20 mEq at 75 ml/hr  Assessment: 65 yoM with history of prostate cancer, small bowel obstruction, recent prolonged hospital stay and discharged on 07/09/13, s/p robotic radical prostatectomy with bilateral pelvic lymphadenectomy 06/21/13, admitted 07/23/13 progressively worsening abdominal distention and coffee ground emesis that initially started 3 days prior to this admission.  Pt reports that after discharge 12/2 he was eating very well at home (Per pt's wife he was eating "too much") until 3 days PTA. Pt states that for the past 3 days he has mostly been eating popsicles due to abdominal pain and nausea  Per surgery, patient to need 7-10 more days of bowel rest prior to attempting further surgery  PICC line has been ordered but do not anticipate placement prior to 1800 so will start TNA on 12/20 as discussed with Surgery PA-C  Electrolytes: Na 132, other lytes WNL Renal: SCr 0.93 (improved since admission), CrCl 65 mL/min LFTs: WNL (12/16) Glucose: no CBG monitoring yet this admission, no Hx DM  RD Nutritional Goals:  Kcal: 1700-1900  Protein: 75-85 grams  Fluid: 1.7-1.9 L/day  Clinimix E 5/20 at 65 mL/hr + 20% fat emulsion at  10 mL/hr will provide 1853 Kcal/d, 78 g protein/d, and 1560 mL/d   Plan:  - start Clinimix E 5/20 at 40 mL/hr and 20% fat emulsion at 10 mL/hr to provide 1324 kcal/d and 48 g protein/d to start 12/20 at 1800  - patient at low risk for refeeding syndrome with decreased PO intake x 5 days PTA but had been with normal PO intake prior - TNA to contain standard MVI and trace elements daily - TNA lab panel in AM, Mondays, and Thursdays  - at start of TNA change MIVF to NS at 40 ml/hr  - NS will need to be reduced as TNA rate is increased - begin CBG checks q4h with sensitive SSI at start of TNA on 12/20  Thank you for the consult.  Tomi Bamberger, PharmD, BCPS Clinical  Pharmacist Pager: [phone] Pharmacy: [phone] 07/26/2013 4:45 PM

## 2013-07-27 ENCOUNTER — Inpatient Hospital Stay (HOSPITAL_COMMUNITY): Payer: Medicare Other

## 2013-07-27 LAB — COMPREHENSIVE METABOLIC PANEL
AST: 15 U/L (ref 0–37)
CO2: 28 mEq/L (ref 19–32)
Calcium: 9.5 mg/dL (ref 8.4–10.5)
Chloride: 94 mEq/L — ABNORMAL LOW (ref 96–112)
Creatinine, Ser: 0.99 mg/dL (ref 0.50–1.35)
GFR calc Af Amer: >90 mL/min (ref >90)
GFR calc non Af Amer: 80 mL/min — ABNORMAL LOW (ref >90)
Glucose, Bld: 113 mg/dL — ABNORMAL HIGH (ref 70–99)
Total Bilirubin: 0.8 mg/dL (ref 0.3–1.2)

## 2013-07-27 LAB — DIFFERENTIAL
Basophils Relative: 0 % (ref 0–1)
Lymphocytes Relative: 13 % (ref 12–46)
Monocytes Absolute: 1.5 10*3/uL — ABNORMAL HIGH (ref 0.1–1.0)
Monocytes Relative: 14 % — ABNORMAL HIGH (ref 3–12)
Neutro Abs: 7.6 10*3/uL (ref 1.7–7.7)
Neutrophils Relative %: 72 % (ref 43–77)

## 2013-07-27 LAB — CBC
HCT: 40.2 % (ref 39.0–52.0)
Hemoglobin: 13.4 g/dL (ref 13.0–17.0)
MCHC: 33.3 g/dL (ref 30.0–36.0)
MCV: 91.8 fL (ref 78.0–100.0)
Platelets: 316 10*3/uL (ref 150–400)
RBC: 4.38 MIL/uL (ref 4.22–5.81)
WBC: 10.6 10*3/uL — ABNORMAL HIGH (ref 4.0–10.5)

## 2013-07-27 LAB — GLUCOSE, CAPILLARY
Glucose-Capillary: 112 mg/dL — ABNORMAL HIGH (ref 70–99)
Glucose-Capillary: 131 mg/dL — ABNORMAL HIGH (ref 70–99)
Glucose-Capillary: 102 mg/dL — ABNORMAL HIGH (ref 70–99)

## 2013-07-27 LAB — MAGNESIUM: Magnesium: 2.2 mg/dL (ref 1.5–2.5)

## 2013-07-27 LAB — URINALYSIS, ROUTINE W REFLEX MICROSCOPIC
Bilirubin Urine: NEGATIVE
Glucose, UA: NEGATIVE mg/dL
Specific Gravity, Urine: 1.025 (ref 1.005–1.030)
pH: 6 (ref 5.0–8.0)

## 2013-07-27 LAB — URINE MICROSCOPIC-ADD ON

## 2013-07-27 LAB — PHOSPHORUS: Phosphorus: 3 mg/dL (ref 2.3–4.6)

## 2013-07-27 NOTE — Progress Notes (Signed)
[facility] PROGRESS NOTE  Keith Bates WUJ:[id number] DOB: 08/08/1941 DOA: 07/23/2013 PCP: Park Pope, MD  Assessment/Plan  Small bowel obstruction recurrent. The patient has a small bowel obstruction when he was admitted to the hospital last month for prostatectomy which was complicated by bowel obstruction and urine leak.  CT of abdomen and pelvis demonstrated small bowel obstruction with transition point at the location of the JP drain at the right lower quadrant.  The JP drain was removed by urology, however, the patient continues to suffer from bowel obstruction. -  General Surgery consult:  Appreciate recommendations -  Agree with PICC and TPN and prolonged bowel rest -  Urology assisted with removal of the JP drain -  continue NG to low intermittent suction  Hyponatremia  Likely iatrogenic due to IVF -  Starting TPN -  Trend  Hypokalemia, likely secondary to GI losses.  Resolved with IV supplementation  Leukocytosis  Recurrent and likely due to UTI which was initially treated with ceftriaxone which was ineffective against culprit organism.  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, MDR -  Start vancomycin day 2 of tx -  Repeat UA with higher WBC  -  F/u repeat culture   Tachycardia  HR in 90s-100s - Patient has history of atrial fibrillation but is currently in sinus rhythm   Code Status: Full  Family Communication: Patient and sister Disposition Plan: Pending improvement in his small bowel obstruction   Consultants:  Urology   Procedures:  Removal of his JP drain on 12/17    CT of abdomen and pelvis on 12/17  Antibiotics:  Ceftriaxone from 12/17 >> 12/19  HPI/Subjective:  Patient says bowels have been quiet, no flatus, still distended and somewhat tender, stable from yesterday  Objective: Filed Vitals:   07/26/13 0654 07/26/13 1500 07/26/13 2141  07/27/13 0507  BP: 148/91 149/97 145/85 155/83  Pulse: 96 94 105 95  Temp: 98.3 F (36.8 C) 98.1 F (36.7 C) 99.8 F (37.7 C) 98.9 F (37.2 C)  TempSrc: Oral Oral Oral Oral  Resp: 16 18 16 16   Height:      Weight:      SpO2: 98% 95% 97% 99%    Intake/Output Summary (Last 24 hours) at 07/27/13 1446 Last data filed at 07/27/13 0814  Gross per 24 hour  Intake   1800 ml  Output   1100 ml  Net    700 ml   Filed Weights   07/23/13 2132  Weight: 62.7 kg (138 lb 3.7 oz)    Exam:   General:   Thin African American male, No acute distress  HEENT:  NCAT, MMM  Cardiovascular:  RRR, nl S1, S2 no mrg, 2+ pulses, warm extremities  Respiratory:  CTAB, no increased WOB  Abdomen:    Hypoactive but present bowel sounds, high pitched, mild to moderate distention, nontender to palpation  MSK:   Normal tone and bulk, no LEE  Neuro:  Grossly intact  Data Reviewed: Basic Metabolic Panel:  Recent Labs Lab 07/23/13 1245 07/24/13 0501 07/25/13 0450 07/26/13 0540 07/27/13 0455  NA 132* 136 130* 132* 132*  K 3.8 3.4* 3.3* 3.9 4.0  CL 86* 93* 92* 95* 94*  CO2 34* 30 27 26 28   GLUCOSE 128* 110* 127* 129* 113*  BUN 24* 21 19 15 15   CREATININE 1.34 1.14 1.12 0.93 0.99  CALCIUM 10.9* 9.9 9.6 9.5 9.5  MG  --   --  2.2  --  2.2  PHOS  --   --   --   --  3.0   Liver Function Tests:  Recent Labs Lab 07/23/13 1245 07/27/13 0455  AST 18 15  ALT 23 14  ALKPHOS 112 94  BILITOT 0.9 0.8  PROT 8.2 6.9  ALBUMIN 4.0 3.2*    Recent Labs Lab 07/23/13 1245  LIPASE 48   No results found for this basename: AMMONIA,  in the last 168 hours CBC:  Recent Labs Lab 07/23/13 1245 07/24/13 0501 07/25/13 0450 07/26/13 0540 07/27/13 0455  WBC 10.9* 8.9 7.8 7.9 10.6*  NEUTROABS 8.4*  --   --   --  7.6  HGB 14.9 13.7 13.7 13.7 13.4  HCT 43.3 40.3 40.8 40.8 40.2  MCV 91.4 91.2 91.5 90.7 91.8  PLT 374 344 332 312 316   Cardiac Enzymes: No results found for this basename: CKTOTAL,  CKMB, CKMBINDEX, TROPONINI,  in the last 168 hours BNP (last 3 results) No results found for this basename: PROBNP,  in the last 8760 hours CBG:  Recent Labs Lab 07/27/13 0808 07/27/13 1224  GLUCAP 119* 110*    Recent Results (from the past 240 hour(s))  URINE CULTURE     Status: None   Collection Time    07/24/13  6:22 AM      Result Value Range Status   Specimen Description URINE, CLEAN CATCH   Final   Special Requests NONE   Final   Culture  Setup Time     Final   Value: 07/24/2013 12:52     Performed at [facility] Count     Final   Value: >=100,000 COLONIES/ML     Performed at [facility]   Culture     Final   Value: STAPHYLOCOCCUS SPECIES (COAGULASE NEGATIVE)     Note: RIFAMPIN AND GENTAMICIN SHOULD NOT BE USED AS SINGLE DRUGS FOR TREATMENT OF STAPH INFECTIONS.     Performed at [facility]   Report Status 07/26/2013 FINAL   Final   Organism ID, Bacteria STAPHYLOCOCCUS SPECIES (COAGULASE NEGATIVE)   Final     Studies: Dg Abd 2 Views  07/27/2013   CLINICAL DATA:  Abdominal pain.  Follow-up small bowel obstruction.  EXAM: ABDOMEN - 2 VIEW  COMPARISON:  07/26/2013.  FINDINGS: Persistent small bowel obstruction bowel gas pattern. There is increased density in the right mid abdomen compared to the prior exam, likely representing contrast entering the colon. Small bowel loop in the central abdomen measures 45 mm. Air-fluid levels are present on the upright. Nasogastric tube is present in the gastric fundus. Fluid level is also present stomach. Small amount of stool and bowel gas in the rectum.  IMPRESSION: Persistent small bowel obstruction bowel gas pattern. The presence of contrast within the right mid abdomen, likely within the cecum suggests partial obstruction.   Electronically Signed   By: Andreas Newport M.D.   On: 07/27/2013 08:46   Dg Abd 2 Views  07/26/2013   CLINICAL DATA:  Evaluate small bowel obstruction. Abdominal pain and  distension.  EXAM: ABDOMEN - 2 VIEW  COMPARISON:  07/23/2013  FINDINGS: Nasogastric tube tip is in the distal esophagus. The stomach is distended and contains gas. Evidence for dilated loops of small bowel but the small bowel loops contain more fluid than air at this time. Air-fluid levels within the small bowel. No significant colonic gas.  IMPRESSION: The  degree of small bowel distension has minimally changed but there is now more fluid within the small bowel loops. Findings remain compatible with a bowel obstruction.  Nasogastric tube tip in the distal esophagus. Nasogastric tube tip location was discussed with the patient's nurse, Amil Amen, on 07/26/2013 at 3:58 p.m.   Electronically Signed   By: Richarda Overlie M.D.   On: 07/26/2013 15:59    Scheduled Meds: . enoxaparin (LOVENOX) injection  40 mg Subcutaneous Q24H  . fluticasone  2 spray Each Nare Daily  . insulin aspart  0-9 Units Subcutaneous Q4H  . pantoprazole (PROTONIX) IV  40 mg Intravenous Q12H  . sodium chloride  3 mL Intravenous Q12H  . vancomycin  750 mg Intravenous Q12H   Continuous Infusions: . sodium chloride    . dextrose 5 % and 0.45 % NaCl with KCl 20 mEq/L 75 mL/hr at 07/27/13 0508  . Marland KitchenTPN (CLINIMIX-E) Adult     And  . fat emulsion      Active Problems:   Small bowel obstruction   SBO (small bowel obstruction)   UTI (urinary tract infection)   Hypokalemia    Time spent: 30 min    Tiona Ruane  [facility] Pager [phone]. If 7PM-7AM, please contact night-coverage at [email or web address], password [facility] 07/27/2013, 2:46 PM  LOS: 4 days

## 2013-07-27 NOTE — Progress Notes (Signed)
Patient ID: Keith Bates, male   DOB: 09-11-1940, 72 y.o.   MRN: [id number]    Subjective: No significant change today. Some occasional crampy abdominal pain no better or worse. No flatus or bowel movements.  Objective: Vital signs in last 24 hours: Temp:  [98.1 F (36.7 C)-99.8 F (37.7 C)] 98.9 F (37.2 C) 08-20-2023 0507) Pulse Rate:  [94-105] 95 08-20-23 0507) Resp:  [16-18] 16 08/20/23 0507) BP: (145-155)/(83-97) 155/83 mmHg 2023-08-20 0507) SpO2:  [95 %-99 %] 99 % August 20, 2023 0507) Last BM Date: 07/21/13  Intake/Output from previous day: 12/19 0701 - 08-20-2023 0700 In: 1800 [I.V.:1800] Out: 1100 [Urine:175; Emesis/NG output:925] Intake/Output this shift:    General appearance: alert, cooperative and no distress GI: moderately distended but soft and nontender. No palpable masses.  Lab Results:   Recent Labs  07/26/13 0540 08/19/2013 0455  WBC 7.9 10.6*  HGB 13.7 13.4  HCT 40.8 40.2  PLT 312 316   BMET  Recent Labs  07/26/13 0540 08-19-2013 0455  NA 132* 132*  K 3.9 4.0  CL 95* 94*  CO2 26 28  GLUCOSE 129* 113*  BUN 15 15  CREATININE 0.93 0.99  CALCIUM 9.5 9.5     Studies/Results: Dg Abd 2 Views  August 19, 2013   CLINICAL DATA:  Abdominal pain.  Follow-up small bowel obstruction.  EXAM: ABDOMEN - 2 VIEW  COMPARISON:  07/26/2013.  FINDINGS: Persistent small bowel obstruction bowel gas pattern. There is increased density in the right mid abdomen compared to the prior exam, likely representing contrast entering the colon. Small bowel loop in the central abdomen measures 45 mm. Air-fluid levels are present on the upright. Nasogastric tube is present in the gastric fundus. Fluid level is also present stomach. Small amount of stool and bowel gas in the rectum.  IMPRESSION: Persistent small bowel obstruction bowel gas pattern. The presence of contrast within the right mid abdomen, likely within the cecum suggests partial obstruction.   Electronically Signed   By: Andreas Newport M.D.   On:  08/19/2013 08:46   Dg Abd 2 Views  07/26/2013   CLINICAL DATA:  Evaluate small bowel obstruction. Abdominal pain and distension.  EXAM: ABDOMEN - 2 VIEW  COMPARISON:  07/23/2013  FINDINGS: Nasogastric tube tip is in the distal esophagus. The stomach is distended and contains gas. Evidence for dilated loops of small bowel but the small bowel loops contain more fluid than air at this time. Air-fluid levels within the small bowel. No significant colonic gas.  IMPRESSION: The degree of small bowel distension has minimally changed but there is now more fluid within the small bowel loops. Findings remain compatible with a bowel obstruction.  Nasogastric tube tip in the distal esophagus. Nasogastric tube tip location was discussed with the patient's nurse, Amil Amen, on 07/26/2013 at 3:58 p.m.   Electronically Signed   By: Richarda Overlie M.D.   On: 07/26/2013 15:59    Anti-infectives: Anti-infectives   Start     Dose/Rate Route Frequency Ordered Stop   08/19/2013 0800  vancomycin (VANCOCIN) IVPB 750 mg/150 ml premix     750 mg 150 mL/hr over 60 Minutes Intravenous Every 12 hours 07/26/13 2003     07/26/13 2100  vancomycin (VANCOCIN) IVPB 750 mg/150 ml premix     750 mg 150 mL/hr over 60 Minutes Intravenous  Once 07/26/13 2002 07/26/13 2148   07/24/13 0900  cefTRIAXone (ROCEPHIN) 1 g in dextrose 5 % 50 mL IVPB  Status:  Discontinued     1 g  100 mL/hr over 30 Minutes Intravenous Every 24 hours 07/24/13 0746 07/26/13 1932      Assessment/Plan: Persistent high grade small bowel obstruction, approximately 4-5 weeks following a robotic prostatectomy.  CT scan has indicated an adhesion around the JP drain site. No clear improvement but remained stable. KUB indicates some passage of contrast into the right colon. Continue nonoperative management for now. TNA being started.   LOS: 4 days    Alishia Lebo T 07/27/2013

## 2013-07-27 NOTE — Progress Notes (Signed)
Upon rounding on patient at 0300, pt's NG tube appeared to be pulled out approximately 4-5 inches. Pt did not present uncomfortable. Sophia RN repositioned the NG tube back into proper position. Verified placement by residual. Reinforced tape on and around nose. Intermittent suction still in place and suctioning well at this time. Patient is comfortable at this time. No further needs. Will continue to monitor closely. Fraser Din, RN

## 2013-07-27 NOTE — Progress Notes (Signed)
PARENTERAL NUTRITION CONSULT NOTE - Follow Up  Pharmacy Consult for TNA  Indication: SBO  Allergies  Allergen Reactions  . 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 Plasticized Base [Plastibase] Rash  . Penicillins Rash    Patient Measurements: Height: 5\' 7"  (170.2 cm) Weight: 138 lb 3.7 oz (62.7 kg) IBW/kg (Calculated) : 66.1 Usual Weight: 71.4 kg  Vital Signs: Temp: 98.9 F (37.2 C) (12/20 0507) Temp src: Oral (12/20 0507) BP: 155/83 mmHg (12/20 0507) Pulse Rate: 95 (12/20 0507) Intake/Output from previous day: 12/19 0701 - 12/20 0700 In: 1200 [I.V.:1200] Out: 1100 [Urine:175; Emesis/NG output:925]  Labs:  Recent Labs  07/25/13 0450 07/26/13 0540 07/27/13 0455  WBC 7.8 7.9 10.6*  HGB 13.7 13.7 13.4  HCT 40.8 40.8 40.2  PLT 332 312 316     Recent Labs  07/25/13 0450 07/26/13 0540 07/27/13 0455  NA 130* 132* 132*  K 3.3* 3.9 4.0  CL 92* 95* 94*  CO2 27 26 28   GLUCOSE 127* 129* 113*  BUN 19 15 15   CREATININE 1.12 0.93 0.99  CALCIUM 9.6 9.5 9.5  MG 2.2  --  2.2  PHOS  --   --  3.0  PROT  --   --  6.9  ALBUMIN  --   --  3.2*  AST  --   --  15  ALT  --   --  14  ALKPHOS  --   --  94  BILITOT  --   --  0.8   Estimated Creatinine Clearance: 60.7 ml/min (by C-G formula based on Cr of 0.99).   No results found for this basename: GLUCAP,  in the last 72 hours  Insulin Requirements in the past 24 hours:  None  Current Nutrition:  NPO MIVF: D5W 1/2 NS with KCl 20 mEq at 75 ml/hr  Assessment: 35 yoM with history of prostate cancer, small bowel obstruction, recent prolonged hospital stay and discharged on 07/09/13, s/p robotic radical prostatectomy with bilateral pelvic lymphadenectomy 06/21/13, admitted 07/23/13 progressively worsening abdominal distention and coffee ground emesis that initially started 3 days prior to this admission.  CT scan revealed bowel obstruction.  Per surgery, patient to need 7-10 more days of bowel rest prior  to attempting further surgery.  Starting TNA 12/20.  Access:  PICC line placed 12/19 TNA Day #: 1  Electrolytes: Na 132, other lytes WNL Renal: SCr 0.99, CrCl 60 mL/min LFTs: WNL Glucose: no CBG monitoring yet this admission, no Hx DM Pre-albumin: pending  RD Nutritional Goals:  Kcal: 1700-1900  Protein: 75-85 grams  Fluid: 1.7-1.9 L/day  Goal: Clinimix E 5/20 at 65 mL/hr + 20% fat emulsion at 10 mL/hr will provide 1853 Kcal/d, 78 g protein/d, and 1560 mL/d  Plan:  At 1800 today:  Start Clinimix E 5/20 at 40 ml/hr.  Fat emulsion at 10 ml/hr.  Plan to advance as tolerated to the goal rate.  TNA to contain standard multivitamins and trace elements.  Reduce IVF to 40 ml/hr.  Start CBG checks with sensitive SSI q4h.   TNA lab panels on Mondays & Thursdays.  F/u daily.  BMET, Mag/Phos in AM.  Thank you for the consult.  Clance Boll, PharmD, BCPS Pager: [phone] 07/27/2013 7:16 AM

## 2013-07-28 ENCOUNTER — Inpatient Hospital Stay (HOSPITAL_COMMUNITY): Payer: Medicare Other

## 2013-07-28 LAB — URINE CULTURE: Colony Count: NO GROWTH

## 2013-07-28 LAB — BASIC METABOLIC PANEL
BUN: 15 mg/dL (ref 6–23)
CO2: 28 mEq/L (ref 19–32)
Calcium: 8.7 mg/dL (ref 8.4–10.5)
Chloride: 99 mEq/L (ref 96–112)
Creatinine, Ser: 0.86 mg/dL (ref 0.50–1.35)
Glucose, Bld: 105 mg/dL — ABNORMAL HIGH (ref 70–99)

## 2013-07-28 LAB — CBC
Hemoglobin: 12.3 g/dL — ABNORMAL LOW (ref 13.0–17.0)
MCH: 30.8 pg (ref 26.0–34.0)
MCHC: 34.2 g/dL (ref 30.0–36.0)
MCV: 90 fL (ref 78.0–100.0)
Platelets: 244 10*3/uL (ref 150–400)
RDW: 12.8 % (ref 11.5–15.5)

## 2013-07-28 LAB — GLUCOSE, CAPILLARY
Glucose-Capillary: 126 mg/dL — ABNORMAL HIGH (ref 70–99)
Glucose-Capillary: 131 mg/dL — ABNORMAL HIGH (ref 70–99)

## 2013-07-28 MED ORDER — TRACE MINERALS CR-CU-F-FE-I-MN-MO-SE-ZN IV SOLN
INTRAVENOUS | Status: AC
  Administered 2013-07-28: 18:00:00 via INTRAVENOUS
  Filled 2013-07-28: qty 2000

## 2013-07-28 MED ORDER — PHENAZOPYRIDINE HCL 100 MG PO TABS
100.0000 mg | ORAL_TABLET | Freq: Three times a day (TID) | ORAL | Status: DC
  Administered 2013-07-29: 08:00:00 100 mg via ORAL
  Filled 2013-07-28 (×4): qty 1

## 2013-07-28 MED ORDER — FAT EMULSION 20 % IV EMUL
250.0000 mL | INTRAVENOUS | Status: AC
  Administered 2013-07-28: 250 mL via INTRAVENOUS
  Filled 2013-07-28: qty 250

## 2013-07-28 MED ORDER — VANCOMYCIN HCL 10 G IV SOLR
1250.0000 mg | Freq: Two times a day (BID) | INTRAVENOUS | Status: DC
  Administered 2013-07-28 – 2013-08-01 (×8): 1250 mg via INTRAVENOUS
  Filled 2013-07-28 (×10): qty 1250

## 2013-07-28 MED ORDER — SODIUM CHLORIDE 0.9 % IV SOLN
500.0000 mg | Freq: Once | INTRAVENOUS | Status: AC
  Administered 2013-07-28: 10:00:00 500 mg via INTRAVENOUS
  Filled 2013-07-28: qty 500

## 2013-07-28 NOTE — Progress Notes (Signed)
PARENTERAL NUTRITION CONSULT NOTE - Follow Up  Pharmacy Consult for TNA  Indication: SBO  Allergies  Allergen Reactions  . 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 Plasticized Base [Plastibase] Rash  . Penicillins Rash    Patient Measurements: Height: 5\' 7"  (170.2 cm) Weight: 138 lb 3.7 oz (62.7 kg) IBW/kg (Calculated) : 66.1 Usual Weight: 71.4 kg  Vital Signs: Temp: 98.5 F (36.9 C) (12/21 0558) Temp src: Oral (12/21 0558) BP: 123/79 mmHg (12/21 0558) Pulse Rate: 86 (12/21 0558) Intake/Output from previous day: 12/20 0701 - 12/21 0700 In: 2030 [P.O.:150; I.V.:980; IV Piggyback:300; TPN:600] Out: 3075 [Urine:2000; Emesis/NG output:1075]  Labs:  Recent Labs  07/26/13 0540 07/27/13 0455 07/28/13 0500  WBC 7.9 10.6* 9.1  HGB 13.7 13.4 12.3*  HCT 40.8 40.2 36.0*  PLT 312 316 244     Recent Labs  07/26/13 0540 07/27/13 0455 07/28/13 0500  NA 132* 132* 134*  K 3.9 4.0 3.6  CL 95* 94* 99  CO2 26 28 28   GLUCOSE 129* 113* 105*  BUN 15 15 15   CREATININE 0.93 0.99 0.86  CALCIUM 9.5 9.5 8.7  MG  --  2.2 2.1  PHOS  --  3.0 2.9  PROT  --  6.9  --   ALBUMIN  --  3.2*  --   AST  --  15  --   ALT  --  14  --   ALKPHOS  --  94  --   BILITOT  --  0.8  --   PREALBUMIN  --  21.3  --   TRIG  --  79  --    Estimated Creatinine Clearance: 69.9 ml/min (by C-G formula based on Cr of 0.86).    Recent Labs  07/27/13 2156 07/27/13 2326 07/28/13 0403  GLUCAP 131* 102* 126*    Insulin Requirements in the past 24 hours:  2 units of sensitive SSI  Current Nutrition:  NPO MIVF: D5W 1/2 NS with KCl 20 mEq at 40 ml/hr  Assessment: 58 yoM with history of prostate cancer, small bowel obstruction, recent prolonged hospital stay and discharged on 07/09/13, s/p robotic radical prostatectomy with bilateral pelvic lymphadenectomy 06/21/13, admitted 07/23/13 progressively worsening abdominal distention and coffee ground emesis that initially started 3 days  prior to this admission.  CT scan revealed bowel obstruction.  Per surgery, patient to need 7-10 more days of bowel rest prior to attempting further surgery.  Starting TNA 12/20.  Access:  PICC line placed 12/19 TNA Day #: 2  NG output: 1400 mL recorded yesterday, I/O net -945 mL. No flatus or BM.  Patient stable.   Electrolytes: Na 134, other lytes WNL Renal: SCr 0.86, CrCl 70 mL/min LFTs: WNL Glucose: CBGs controlled Pre-albumin: 21.3 (12/20)  RD Nutritional Goals:  Kcal: 1700-1900  Protein: 75-85 grams  Fluid: 1.7-1.9 L/day  Goal for TNA: Clinimix E 5/20 at 65 mL/hr + 20% fat emulsion at 10 mL/hr will provide 1853 Kcal/d, 78 g protein/d, and 1560 mL/d  Plan:  At 1800 today:  Advance Clinimix E 5/20 to 65 ml/hr (goal rate).  Fat emulsion at 10 ml/hr.  TNA to contain standard multivitamins and trace elements.  Reduce IVF to [facility] after increasing TNA to goal rate today.  Continue CBG checks with sensitive SSI q4h.   TNA lab panels on Mondays & Thursdays.  F/u daily.    Clance Boll, PharmD, BCPS Pager: [phone] 07/28/2013 7:22 AM

## 2013-07-28 NOTE — Progress Notes (Addendum)
[facility] PROGRESS NOTE  Keith Bates ZOX:[id number] DOB: [birth year] DOA: 07/23/2013 PCP: Park Pope, MD  Assessment/Plan  Small bowel obstruction recurrent. The patient has a small bowel obstruction when he was admitted to the hospital last month for prostatectomy which was complicated by bowel obstruction and urine leak.  CT of abdomen and pelvis demonstrated small bowel obstruction with transition point at the location of the JP drain at the right lower quadrant.  The JP drain was removed by urology, however, the patient continues to suffer from bowel obstruction. -  General Surgery consult:  Appreciate recommendations -  Agree with PICC and TPN and prolonged bowel rest -  Urology assisted with removal of the JP drain -  continue NG to low intermittent suction  Hyponatremia  Likely iatrogenic due to IVF and resolving -  Continue TPN  Hypokalemia, likely secondary to GI losses.  Resolved with IV supplementation  Leukocytosis  Recurrent and likely due to UTI which was initially treated with ceftriaxone which was ineffective against culprit organism.  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, MDR -  vancomycin day #3  -  Start pyridium  Tachycardia  HR in 90s-100s - Patient has history of atrial fibrillation but is currently in sinus rhythm   Code Status: Full  Family Communication: Patient and sister Disposition Plan: Pending improvement in his small bowel obstruction   Consultants:  Urology   Procedures:  Removal of his JP drain on 12/17    CT of abdomen and pelvis on 12/17  Antibiotics:  Ceftriaxone from 12/17 >> 12/19  Vancomycin 12/19  HPI/Subjective:  Patient says bowels have been quiet, no flatus, less distended  Objective: Filed Vitals:   07/27/13 1503 07/27/13 2158 07/28/13 0558 07/28/13 1400  BP: 128/90 132/77 123/79 118/64  Pulse: 109 86 86 75   Temp: 98.3 F (36.8 C) 97.4 F (36.3 C) 98.5 F (36.9 C) 98.5 F (36.9 C)  TempSrc: Oral Oral Oral Oral  Resp: 18 20 20 18   Height:      Weight:      SpO2: 100% 99% 99% 98%    Intake/Output Summary (Last 24 hours) at 07/28/13 1709 Last data filed at 07/28/13 1400  Gross per 24 hour  Intake   1700 ml  Output   3000 ml  Net  -1300 ml   Filed Weights   07/23/13 2132  Weight: 62.7 kg (138 lb 3.7 oz)    Exam:   General:   Thin African American male, No acute distress  HEENT:  NCAT, MMM  Cardiovascular:  RRR, nl S1, S2 no mrg, 2+ pulses, warm extremities  Respiratory:  CTAB, no increased WOB  Abdomen:    absent bowel sounds, nondistended, nontender to palpation  MSK:   Normal tone and bulk, no LEE  Neuro:  Grossly intact  Data Reviewed: Basic Metabolic Panel:  Recent Labs Lab 07/24/13 0501 07/25/13 0450 07/26/13 0540 07/27/13 0455 07/28/13 0500  NA 136 130* 132* 132* 134*  K 3.4* 3.3* 3.9 4.0 3.6  CL 93* 92* 95* 94* 99  CO2 30 27 26 28 28   GLUCOSE 110* 127* 129* 113* 105*  BUN 21 19 15 15 15   CREATININE 1.14 1.12 0.93 0.99 0.86  CALCIUM 9.9 9.6 9.5 9.5 8.7  MG  --  2.2  --  2.2 2.1  PHOS  --   --   --  3.0 2.9  Liver Function Tests:  Recent Labs Lab 07/23/13 1245 07/27/13 0455  AST 18 15  ALT 23 14  ALKPHOS 112 94  BILITOT 0.9 0.8  PROT 8.2 6.9  ALBUMIN 4.0 3.2*    Recent Labs Lab 07/23/13 1245  LIPASE 48   No results found for this basename: AMMONIA,  in the last 168 hours CBC:  Recent Labs Lab 07/23/13 1245 07/24/13 0501 07/25/13 0450 07/26/13 0540 07/27/13 0455 07/28/13 0500  WBC 10.9* 8.9 7.8 7.9 10.6* 9.1  NEUTROABS 8.4*  --   --   --  7.6  --   HGB 14.9 13.7 13.7 13.7 13.4 12.3*  HCT 43.3 40.3 40.8 40.8 40.2 36.0*  MCV 91.4 91.2 91.5 90.7 91.8 90.0  PLT 374 344 332 312 316 244   Cardiac Enzymes: No results found for this basename: CKTOTAL, CKMB, CKMBINDEX, TROPONINI,  in the last 168 hours BNP (last 3 results) No  results found for this basename: PROBNP,  in the last 8760 hours CBG:  Recent Labs Lab 07/27/13 2326 07/28/13 0403 07/28/13 0720 07/28/13 1211 07/28/13 1629  GLUCAP 102* 126* 121* 100* 101*    Recent Results (from the past 240 hour(s))  URINE CULTURE     Status: None   Collection Time    07/24/13  6:22 AM      Result Value Range Status   Specimen Description URINE, CLEAN CATCH   Final   Special Requests NONE   Final   Culture  Setup Time     Final   Value: 07/24/2013 12:52     Performed at [facility] Count     Final   Value: >=100,000 COLONIES/ML     Performed at [facility]   Culture     Final   Value: STAPHYLOCOCCUS SPECIES (COAGULASE NEGATIVE)     Note: RIFAMPIN AND GENTAMICIN SHOULD NOT BE USED AS SINGLE DRUGS FOR TREATMENT OF STAPH INFECTIONS.     Performed at [facility]   Report Status 07/26/2013 FINAL   Final   Organism ID, Bacteria STAPHYLOCOCCUS SPECIES (COAGULASE NEGATIVE)   Final     Studies: Dg Chest Port 1 View  07/28/2013   CLINICAL DATA:  PICC line placement.  EXAM: PORTABLE CHEST - 1 VIEW  COMPARISON:  07/23/2013  FINDINGS: Right PICC line is in place with the tip at the cavoatrial junction. NG tube is in the proximal stomach. Heart is normal size. Lungs are clear. No effusions. Degenerative changes in the shoulders. No acute bony abnormality.  IMPRESSION: Right PICC line tip at the cavoatrial junction.  No active disease.   Electronically Signed   By: Charlett Nose M.D.   On: 07/28/2013 07:59   Dg Abd 2 Views  07/27/2013   CLINICAL DATA:  Abdominal pain.  Follow-up small bowel obstruction.  EXAM: ABDOMEN - 2 VIEW  COMPARISON:  07/26/2013.  FINDINGS: Persistent small bowel obstruction bowel gas pattern. There is increased density in the right mid abdomen compared to the prior exam, likely representing contrast entering the colon. Small bowel loop in the central abdomen measures 45 mm. Air-fluid levels are present on the  upright. Nasogastric tube is present in the gastric fundus. Fluid level is also present stomach. Small amount of stool and bowel gas in the rectum.  IMPRESSION: Persistent small bowel obstruction bowel gas pattern. The presence of contrast within the right mid abdomen, likely within the cecum suggests partial obstruction.   Electronically Signed   By: Juliene Pina  Lamke M.D.   On: 07/27/2013 08:46    Scheduled Meds: . enoxaparin (LOVENOX) injection  40 mg Subcutaneous Q24H  . fluticasone  2 spray Each Nare Daily  . insulin aspart  0-9 Units Subcutaneous Q4H  . pantoprazole (PROTONIX) IV  40 mg Intravenous Q12H  . sodium chloride  3 mL Intravenous Q12H  . vancomycin  1,250 mg Intravenous Q12H   Continuous Infusions: . sodium chloride 40 mL/hr (07/27/13 1722)  . Marland KitchenTPN (CLINIMIX-E) Adult 40 mL/hr at 07/27/13 1811   And  . fat emulsion 250 mL (07/27/13 1811)  . Marland KitchenTPN (CLINIMIX-E) Adult     And  . fat emulsion      Active Problems:   Small bowel obstruction   SBO (small bowel obstruction)   UTI (urinary tract infection)   Hypokalemia    Time spent: 30 min    Angy Swearengin  [facility] Pager [phone]. If 7PM-7AM, please contact night-coverage at [email or web address], password [facility] 07/28/2013, 5:09 PM  LOS: 5 days

## 2013-07-28 NOTE — Progress Notes (Signed)
Patient ID: Keith Bates, male   DOB: 13-Jun-1941, 72 y.o.   MRN: [id number]    Subjective: States he feels better overall today. Denies abdominal pain. He has not had any flatus or bowel movements.  Objective: Vital signs in last 24 hours: Temp:  [97.4 F (36.3 C)-98.5 F (36.9 C)] 98.5 F (36.9 C) (12/21 0558) Pulse Rate:  [86-109] 86 (12/21 0558) Resp:  [18-20] 20 (12/21 0558) BP: (123-132)/(77-90) 123/79 mmHg (12/21 0558) SpO2:  [99 %-100 %] 99 % (12/21 0558) Last BM Date: 07/21/13  Intake/Output from previous day: 12/20 0701 - 12/21 0700 In: 2030 [P.O.:150; I.V.:980; IV Piggyback:300; TPN:600] Out: 3075 [Urine:2000; Emesis/NG output:1075] Intake/Output this shift:    General appearance: alert, cooperative and no distress GI: abdomen seems less distended today. Soft and nontender. No masses.  Lab Results:   Recent Labs  07/27/13 0455 07/28/13 0500  WBC 10.6* 9.1  HGB 13.4 12.3*  HCT 40.2 36.0*  PLT 316 244   BMET  Recent Labs  07/27/13 0455 07/28/13 0500  NA 132* 134*  K 4.0 3.6  CL 94* 99  CO2 28 28  GLUCOSE 113* 105*  BUN 15 15  CREATININE 0.99 0.86  CALCIUM 9.5 8.7     Studies/Results: Dg Chest Port 1 View  07/28/2013   CLINICAL DATA:  PICC line placement.  EXAM: PORTABLE CHEST - 1 VIEW  COMPARISON:  07/23/2013  FINDINGS: Right PICC line is in place with the tip at the cavoatrial junction. NG tube is in the proximal stomach. Heart is normal size. Lungs are clear. No effusions. Degenerative changes in the shoulders. No acute bony abnormality.  IMPRESSION: Right PICC line tip at the cavoatrial junction.  No active disease.   Electronically Signed   By: Charlett Nose M.D.   On: 07/28/2013 07:59   Dg Abd 2 Views  07/27/2013   CLINICAL DATA:  Abdominal pain.  Follow-up small bowel obstruction.  EXAM: ABDOMEN - 2 VIEW  COMPARISON:  07/26/2013.  FINDINGS: Persistent small bowel obstruction bowel gas pattern. There is increased density in the right mid abdomen  compared to the prior exam, likely representing contrast entering the colon. Small bowel loop in the central abdomen measures 45 mm. Air-fluid levels are present on the upright. Nasogastric tube is present in the gastric fundus. Fluid level is also present stomach. Small amount of stool and bowel gas in the rectum.  IMPRESSION: Persistent small bowel obstruction bowel gas pattern. The presence of contrast within the right mid abdomen, likely within the cecum suggests partial obstruction.   Electronically Signed   By: Andreas Newport M.D.   On: 07/27/2013 08:46   Dg Abd 2 Views  07/26/2013   CLINICAL DATA:  Evaluate small bowel obstruction. Abdominal pain and distension.  EXAM: ABDOMEN - 2 VIEW  COMPARISON:  07/23/2013  FINDINGS: Nasogastric tube tip is in the distal esophagus. The stomach is distended and contains gas. Evidence for dilated loops of small bowel but the small bowel loops contain more fluid than air at this time. Air-fluid levels within the small bowel. No significant colonic gas.  IMPRESSION: The degree of small bowel distension has minimally changed but there is now more fluid within the small bowel loops. Findings remain compatible with a bowel obstruction.  Nasogastric tube tip in the distal esophagus. Nasogastric tube tip location was discussed with the patient's nurse, Amil Amen, on 07/26/2013 at 3:58 p.m.   Electronically Signed   By: Richarda Overlie M.D.   On: 07/26/2013 15:59  Anti-infectives: Anti-infectives   Start     Dose/Rate Route Frequency Ordered Stop   07/27/13 0800  vancomycin (VANCOCIN) IVPB 750 mg/150 ml premix     750 mg 150 mL/hr over 60 Minutes Intravenous Every 12 hours 07/26/13 2003     07/26/13 2100  vancomycin (VANCOCIN) IVPB 750 mg/150 ml premix     750 mg 150 mL/hr over 60 Minutes Intravenous  Once 07/26/13 2002 07/26/13 2148   07/24/13 0900  cefTRIAXone (ROCEPHIN) 1 g in dextrose 5 % 50 mL IVPB  Status:  Discontinued     1 g 100 mL/hr over 30 Minutes  Intravenous Every 24 hours 07/24/13 0746 07/26/13 1932      Assessment/Plan: Small bowel obstruction approximately 5 weeks following a robotic prostatectomy, apparently at JP drain site likely secondary to adhesion. Patient is very stable and abdomen seems definitely less distended today. Starting TNA. Repeat abdominal x-rays tomorrow. Seems to be improved.     LOS: 5 days    Livianna Petraglia T 07/28/2013

## 2013-07-28 NOTE — Progress Notes (Signed)
NUTRITION Follow Up  DOCUMENTATION CODES Per approved criteria  -Not Applicable   INTERVENTION:  Continue TPN per pharmacy.  RD to monitor.  Diet advancement per MD discretion  NUTRITION DIAGNOSIS: Inadequate oral intake related to inability to eat as evidenced by NPO status.   Goal: Pt to meet >/= 90% of their estimated nutrition needs   Monitor:  Bowel Function Diet advancement PO intake Weight Labs  Reason for Assessment: Malnutrition Screening Tool, score of 3  72 y.o. male  Admitting Dx: SBO  ASSESSMENT: 72 year old male with history of prostate cancer, small bowel obstruction, recent prolonged hospital stay and discharged on 07/09/2013, status post robotic radical prostatectomy with bilateral pelvic lymphadenectomy 06/21/2013, who now presents to [facility] long emergency department with main concern of progressively worsening abdominal distention and coffee ground emesis that initially started 3 days prior to this admission.  RD familiar with pt from previous admission. Pt reports that after discharge 12/2 he was eating very well at home (Per pt's wife he was eating "too much") until 3 days ago. Pt states that for the past 3 days he has mostly been eating popsicles due to abdominal pain and nausea. Pt states he usually weighs 157 lbs. Weight history shows 9% weight loss in less than 2 months.  12/21  Patient remains NPO and has started on TPN with lipids secondary to continued SBO.  Some improved per MD.  TPN:    Clinimix E 5/20 at 40 ml/hr to increase to goal of 65 ml/hr.  20% lipids at 10 ml/hr.  TPN with lipids at goal to provide:  1853 kcal, 78 gm protein and meet 100% of estimated needs.    Height: Ht Readings from Last 1 Encounters:  07/23/13 5\' 7"  (1.702 m)    Weight: Wt Readings from Last 1 Encounters:  07/23/13 138 lb 3.7 oz (62.7 kg)    Ideal Body Weight: 148 lbs  % Ideal Body Weight: 93%  Wt Readings from Last 10 Encounters:  07/23/13 138 lb 3.7  oz (62.7 kg)  07/04/13 140 lb 14 oz (63.9 kg)  07/04/13 140 lb 14 oz (63.9 kg)  06/17/13 151 lb (68.493 kg)  06/06/13 151 lb (68.493 kg)  05/17/13 151 lb 3.2 oz (68.584 kg)  04/09/13 157 lb (71.215 kg)  03/07/13 147 lb (66.679 kg)    Usual Body Weight: 157 lbs  % Usual Body Weight: 88%  BMI:  Body mass index is 21.64 kg/(m^2). (normal range)  Estimated Nutritional Needs: Kcal: 1700-1900 Protein: 75-85 grams Fluid: 1.7-1.9 L/day  Skin: non-pitting RLE and LLE edema; abdominal incision  Diet Order: NPO  EDUCATION NEEDS: -No education needs identified at this time   Intake/Output Summary (Last 24 hours) at 07/28/13 1340 Last data filed at 07/28/13 1100  Gross per 24 hour  Intake   1880 ml  Output   3250 ml  Net  -1370 ml    Last BM: 12/14  Labs:   Recent Labs Lab 07/25/13 0450 07/26/13 0540 07/27/13 0455 07/28/13 0500  NA 130* 132* 132* 134*  K 3.3* 3.9 4.0 3.6  CL 92* 95* 94* 99  CO2 27 26 28 28   BUN 19 15 15 15   CREATININE 1.12 0.93 0.99 0.86  CALCIUM 9.6 9.5 9.5 8.7  MG 2.2  --  2.2 2.1  PHOS  --   --  3.0 2.9  GLUCOSE 127* 129* 113* 105*    CBG (last 3)   Recent Labs  07/28/13 0403 07/28/13 0720 07/28/13 1211  GLUCAP 126* 121* 100*    Scheduled Meds: . enoxaparin (LOVENOX) injection  40 mg Subcutaneous Q24H  . fluticasone  2 spray Each Nare Daily  . insulin aspart  0-9 Units Subcutaneous Q4H  . pantoprazole (PROTONIX) IV  40 mg Intravenous Q12H  . sodium chloride  3 mL Intravenous Q12H  . vancomycin  1,250 mg Intravenous Q12H    Continuous Infusions: . sodium chloride 40 mL/hr (07/27/13 1722)  . Marland KitchenTPN (CLINIMIX-E) Adult 40 mL/hr at 07/27/13 1811   And  . fat emulsion 250 mL (07/27/13 1811)  . Marland KitchenTPN (CLINIMIX-E) Adult     And  . fat emulsion      Past Medical History  Diagnosis Date  . Enlarged prostate   . Prostate cancer   . Unspecified hemorrhoids without mention of complication   . Loss of weight   . GERD  (gastroesophageal reflux disease)   . Environmental allergies   . Arthritis   . Hx of ulcer disease   . Nocturia   . DDD (degenerative disc disease)     Past Surgical History  Procedure Laterality Date  . Hemorrhoid banding    . Colonoscopy  03/13/2013  . Robot assisted laparoscopic radical prostatectomy N/A 06/21/2013    Procedure: ROBOTIC ASSISTED LAPAROSCOPIC RADICAL PROSTATECTOMY;  Surgeon: Sebastian Ache, MD;  Location: [facility] ORS;  Service: Urology;  Laterality: N/A;  . Lymphadenectomy Bilateral 06/21/2013    Procedure: LYMPHADENECTOMY  " BILATERAL PELVIC LYMPH NODE DISSECTION ;  Surgeon: Sebastian Ache, MD;  Location: [facility] ORS;  Service: Urology;  Laterality: Bilateral;    Oran Rein, RD, LDN Clinical Inpatient Dietitian Pager:  [phone] Weekend and after hours pager:  [phone]

## 2013-07-28 NOTE — Progress Notes (Addendum)
PICC placed 2 days ago by ECG (no xray done),  3" round area BELOW picc now with hardended area, I have had IVT RN Misty Stanley come and assess.  She does not think it is an infiltration or problem  c PICC but suggests getting chest xray.  I have notified Lenny Pastel NP who came up to assess.  Area marked, has blood return.    CMS WNL in rue extremity.

## 2013-07-28 NOTE — Progress Notes (Signed)
At beginning of shift CTM informs me that the monitors have been monitoring him in lead 1 which only showed SR, placed on lead 2 interpretation and Lenny from CTM states pt has really been in [employer].  I believe strip being interpreted incorrectly and can still identify P waves and consider strip SR.  Later in shift, Lenny, CTM reports pt has returned to SR.  Strip placed on chart

## 2013-07-28 NOTE — Progress Notes (Signed)
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 Plasticized Base [Plastibase] Rash  . Penicillins Rash    Patient Measurements: Height: 5\' 7"  (170.2 cm) Weight: 138 lb 3.7 oz (62.7 kg) IBW/kg (Calculated) : 66.1  Vital Signs: Temp: 98.5 F (36.9 C) (12/21 0558) Temp src: Oral (12/21 0558) BP: 123/79 mmHg (12/21 0558) Pulse Rate: 86 (12/21 0558) Intake/Output from previous day: 12/20 0701 - 12/21 0700 In: 2030 [P.O.:150; I.V.:980; IV Piggyback:300; TPN:600] Out: 3075 [Urine:2000; Emesis/NG output:1075] Intake/Output from this shift:    Labs:  Recent Labs  07/26/13 0540 07/27/13 0455 07/28/13 0500  WBC 7.9 10.6* 9.1  HGB 13.7 13.4 12.3*  PLT 312 316 244  CREATININE 0.93 0.99 0.86   Estimated Creatinine Clearance: 69.9 ml/min (by C-G formula based on Cr of 0.86).   Microbiology: Recent Results (from the past 720 hour(s))  URINE CULTURE     Status: None   Collection Time    07/08/13  9:23 AM      Result Value Range Status   Specimen Description URINE, CATHETERIZED   Final   Special Requests NONE   Final   Culture  Setup Time     Final   Value: 07/08/2013 13:05     Performed at [facility] Count     Final   Value: >=100,000 COLONIES/ML     Performed at [facility]   Culture     Final   Value: KLEBSIELLA PNEUMONIAE     ENTEROCOCCUS SPECIES     Performed at [facility]   Report Status 07/11/2013 FINAL   Final   Organism ID, Bacteria KLEBSIELLA PNEUMONIAE   Final   Organism ID, Bacteria ENTEROCOCCUS SPECIES   Final  URINE CULTURE     Status: None   Collection Time    07/24/13  6:22 AM      Result Value Range Status   Specimen Description URINE, CLEAN CATCH   Final   Special Requests NONE   Final   Culture  Setup Time     Final   Value: 07/24/2013  12:52     Performed at [facility] Count     Final   Value: >=100,000 COLONIES/ML     Performed at [facility]   Culture     Final   Value: STAPHYLOCOCCUS SPECIES (COAGULASE NEGATIVE)     Note: RIFAMPIN AND GENTAMICIN SHOULD NOT BE USED AS SINGLE DRUGS FOR TREATMENT OF STAPH INFECTIONS.     Performed at [facility]   Report Status 07/26/2013 FINAL   Final   Organism ID, Bacteria STAPHYLOCOCCUS SPECIES (COAGULASE NEGATIVE)   Final    Medical History: Past Medical History  Diagnosis Date  . Enlarged prostate   . Prostate cancer   . Unspecified hemorrhoids without mention of complication   . Loss of weight   . GERD (gastroesophageal reflux disease)   . Environmental allergies   . Arthritis   . Hx of ulcer disease   . Nocturia   . DDD (degenerative disc disease)     Assessment: 60 yoM admitted 12/16 for SBO.  Pharmacy consulted 12/19 to dose vancomycin for CoNS UTI- MDR and inability to take PO medications.   Note patient treated for recent Klebsiella and enterococcus UTI with Bactrim x  10 days starting 12/2.  Day #3 Vancomycin 750 mg IV q12h.    Vancomycin trough: 6.5 mcg/ml  Tmax: Afebrile  WBCs: WNL  Renal: SCr 0.86, CrCl 70 mL/min (GC), 79 (N)  Antiinfectives: 12/17 >> ceftriaxone >> 12/19 12/19 >> vanc >>  Microbiology: 12/17 urine: coagulase negative staph (S=nitrofurantoin, vancomycin)  Goal of Therapy:  Vancomycin trough level 10-15 mcg/ml  Plan:  Adjust Vancomycin to 1250 mg IV q12h.  Clance Boll, PharmD, BCPS Pager: [phone] 07/28/2013 9:30 AM

## 2013-07-29 ENCOUNTER — Inpatient Hospital Stay (HOSPITAL_COMMUNITY): Payer: Medicare Other

## 2013-07-29 ENCOUNTER — Encounter: Payer: Self-pay | Admitting: *Deleted

## 2013-07-29 DIAGNOSIS — E46 Unspecified protein-calorie malnutrition: Secondary | ICD-10-CM

## 2013-07-29 DIAGNOSIS — D649 Anemia, unspecified: Secondary | ICD-10-CM

## 2013-07-29 LAB — DIFFERENTIAL
Basophils Absolute: 0 10*3/uL (ref 0.0–0.1)
Basophils Relative: 0 % (ref 0–1)
Eosinophils Absolute: 0.1 10*3/uL (ref 0.0–0.7)
Monocytes Absolute: 1 10*3/uL (ref 0.1–1.0)
Monocytes Relative: 12 % (ref 3–12)
Neutro Abs: 5.7 10*3/uL (ref 1.7–7.7)
Neutrophils Relative %: 71 % (ref 43–77)

## 2013-07-29 LAB — CBC
Hemoglobin: 11.5 g/dL — ABNORMAL LOW (ref 13.0–17.0)
MCH: 30.7 pg (ref 26.0–34.0)
MCV: 90.7 fL (ref 78.0–100.0)
RBC: 3.75 MIL/uL — ABNORMAL LOW (ref 4.22–5.81)

## 2013-07-29 LAB — PHOSPHORUS: Phosphorus: 2.9 mg/dL (ref 2.3–4.6)

## 2013-07-29 LAB — COMPREHENSIVE METABOLIC PANEL
ALT: 8 U/L (ref 0–53)
CO2: 29 mEq/L (ref 19–32)
Calcium: 8.6 mg/dL (ref 8.4–10.5)
Chloride: 96 mEq/L (ref 96–112)
GFR calc Af Amer: >90 mL/min (ref >90)
GFR calc non Af Amer: 90 mL/min — ABNORMAL LOW (ref >90)
Glucose, Bld: 127 mg/dL — ABNORMAL HIGH (ref 70–99)
Sodium: 133 mEq/L — ABNORMAL LOW (ref 135–145)
Total Bilirubin: 0.3 mg/dL (ref 0.3–1.2)
Total Protein: 5.6 g/dL — ABNORMAL LOW (ref 6.0–8.3)

## 2013-07-29 LAB — GLUCOSE, CAPILLARY
Glucose-Capillary: 113 mg/dL — ABNORMAL HIGH (ref 70–99)
Glucose-Capillary: 107 mg/dL — ABNORMAL HIGH (ref 70–99)
Glucose-Capillary: 114 mg/dL — ABNORMAL HIGH (ref 70–99)
Glucose-Capillary: 107 mg/dL — ABNORMAL HIGH (ref 70–99)

## 2013-07-29 LAB — PREALBUMIN: Prealbumin: 16.8 mg/dL — ABNORMAL LOW (ref 17.0–34.0)

## 2013-07-29 MED ORDER — FAT EMULSION 20 % IV EMUL
250.0000 mL | INTRAVENOUS | Status: AC
  Administered 2013-07-29: 250 mL via INTRAVENOUS
  Filled 2013-07-29: qty 250

## 2013-07-29 MED ORDER — POTASSIUM CHLORIDE 10 MEQ/100ML IV SOLN
10.0000 meq | INTRAVENOUS | Status: AC
  Administered 2013-07-29 (×2): 10 meq via INTRAVENOUS
  Filled 2013-07-29 (×2): qty 100

## 2013-07-29 MED ORDER — TRACE MINERALS CR-CU-F-FE-I-MN-MO-SE-ZN IV SOLN
INTRAVENOUS | Status: AC
  Administered 2013-07-29: 18:00:00 via INTRAVENOUS
  Filled 2013-07-29: qty 2000

## 2013-07-29 MED ORDER — GLYCERIN (LAXATIVE) 2.1 G RE SUPP
1.0000 | Freq: Once | RECTAL | Status: AC
  Administered 2013-07-29: 1 via RECTAL
  Filled 2013-07-29: qty 1

## 2013-07-29 NOTE — Progress Notes (Signed)
PARENTERAL NUTRITION CONSULT NOTE - Follow Up  Pharmacy Consult for TNA  Indication: SBO  Allergies  Allergen Reactions  . 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 Plasticized Base [Plastibase] Rash  . Penicillins Rash    Patient Measurements: Height: 5\' 7"  (170.2 cm) Weight: 138 lb 3.7 oz (62.7 kg) IBW/kg (Calculated) : 66.1 Usual Weight: 71.4 kg  Vital Signs: Temp: 98 F (36.7 C) (12/22 0503) Temp src: Oral (12/22 0503) BP: 137/73 mmHg (12/22 0503) Pulse Rate: 89 (12/22 0503) Intake/Output from previous day: 12/21 0701 - 12/22 0700 In: 2620 [P.O.:510; I.V.:960; IV Piggyback:250; TPN:900] Out: 1825 [Urine:675; Emesis/NG output:1150]  Labs:  Recent Labs  07/27/13 0455 07/28/13 0500 07/29/13 0405  WBC 10.6* 9.1 8.0  HGB 13.4 12.3* 11.5*  HCT 40.2 36.0* 34.0*  PLT 316 244 232     Recent Labs  07/27/13 0455 07/28/13 0500 07/29/13 0405  NA 132* 134* 133*  K 4.0 3.6 3.1*  CL 94* 99 96  CO2 28 28 29   GLUCOSE 113* 105* 127*  BUN 15 15 13   CREATININE 0.99 0.86 0.76  CALCIUM 9.5 8.7 8.6  MG 2.2 2.1 2.1  PHOS 3.0 2.9 2.9  PROT 6.9  --  5.6*  ALBUMIN 3.2*  --  2.6*  AST 15  --  11  ALT 14  --  8  ALKPHOS 94  --  68  BILITOT 0.8  --  0.3  PREALBUMIN 21.3  --   --   TRIG 79  --   --    Estimated Creatinine Clearance: 75.1 ml/min (by C-G formula based on Cr of 0.76).    Recent Labs  07/28/13 2141 07/29/13 0030 07/29/13 0452  GLUCAP 131* 134* 121*    Nutritional Goals:   RD recs 12/21: 1700-1900 Kcal, 75-85 g protein and 1.7-1.9 L fluid / day.  Clinimix 5/20 at a goal rate of 65 ml/hr + 20% fat emulsion at 10 ml/hr to provide: 78 g/day protein, 1853 Kcal/day.  Current nutrition:   Diet: NPO  IVF: NS @ 40 ml/hr  CBGs & Insulin requirements past 24 hours:   CBG range: 100-134  3 units of sensitive SSI  Assessment:  37 yoM with history of prostate cancer, small bowel obstruction, recent prolonged hospital stay and  discharged on 07/09/13, s/p robotic radical prostatectomy with bilateral pelvic lymphadenectomy 06/21/13, admitted 07/23/13 progressively worsening abdominal distention and coffee ground emesis that initially started 3 days prior to this admission.  CT scan revealed bowel obstruction.  Per surgery, patient to need 7-10 more days of bowel rest prior to attempting further surgery.  Starting TNA 12/20.   TPN Access: PICC line placed 12/19  TPN day# 3.  NG output increased to 1150 ml, net I/O +800 mL.  No flatus or BM reported.  Labs:  Renal function: SCr 0.76, stable  Hepatic function: LFTs WNL (12/22)  Electrolytes: Na 133 (unable to adjust in TPN), K 3.1 (replaced per MD order).  Other WNL.  Pre-Albumin:  21.3 (12/20)  TG:  79 (12/20)  Glucose: CBGs at goal < 150   Plan:  At 1800 today:  Continue Clinimix E 5/20 at 65 ml/hr.  TNA to contain standard multivitamins and trace elements daily.  Continue 20% fat emulsion at 10 ml/hr.  IVF per MD orders.  Continue CBG checks with sensitive SSI q4h.   TNA lab panels on Mondays & Thursdays.  F/u daily.   Lynann Beaver PharmD, BCPS Pager [phone]  07/29/2013 7:47 AM

## 2013-07-29 NOTE — Progress Notes (Signed)
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 TRUS volume 193gm with large median lobe. Path [id number] with multifocal positive margins including bladder neck, seminal vesicals. Course complicated by urine leak that resolved with prolonged vented suction drainage as confirmed by cystogram last week.    2 - Small Bowel Obstruction - Pt with new small bowel obstruction by CT 12/17 durnig admission for n/v. No abscess, recurrent leak, bowel perforation. He did not have any direct bowel surgery as part of his prior procedure. Current JP drain felt to be possible nidus and therefore removed as no longer necessary. General surgery consult 12/19 and reccomends conservative measures with continued bowel rest + TPN.  Today Keith Bates is stable. No new flatus or BM. Abd remains distended, but improved. No interval fevers. NGT now working much better after repostioning. KUB with some contrast that passes to distal colon.  Objective: Vital signs in last 24 hours: Temp:  [98 F (36.7 C)-98.7 F (37.1 C)] 98 F (36.7 C) (12/22 0503) Pulse Rate:  [75-89] 89 (12/22 0503) Resp:  [18] 18 (12/22 0503) BP: (118-142)/(64-98) 137/73 mmHg (12/22 0503) SpO2:  [98 %-99 %] 99 % (12/22 0503) Last BM Date: 07/21/13  Intake/Output from previous day: 12/21 0701 - 12/22 0700 In: 2620 [P.O.:510; I.V.:960; IV Piggyback:250; TPN:900] Out: 1825 [Urine:675; Emesis/NG output:1150] Intake/Output this shift:    General appearance: alert, cooperative and appears stated age Head: Normocephalic, without obvious abnormality, atraumatic Eyes: conjunctivae/corneas clear. PERRL, EOM's intact. Fundi benign. Ears: normal TM's and external ear canals both ears Nose: Nares normal. Septum midline. Mucosa normal. No drainage or sinus tenderness. Throat: lips, mucosa, and tongue normal;  teeth and gums normal Neck: no adenopathy, no carotid bruit, no JVD, supple, symmetrical, trachea midline and thyroid not enlarged, symmetric, no tenderness/mass/nodules Back: symmetric, no curvature. ROM normal. No CVA tenderness. Resp: clear to auscultation bilaterally Chest wall: no tenderness Cardio: regular rate and rhythm, S1, S2 normal, no murmur, click, rub or gallop GI: decreased distention. NGT wtih bilious output. Recent port sites c/d/i.  Male genitalia: normal Extremities: extremities normal, atraumatic, no cyanosis or edema Pulses: 2+ and symmetric Skin: Skin color, texture, turgor normal. No rashes or lesions Lymph nodes: Cervical, supraclavicular, and axillary nodes normal. Neurologic: Grossly normal Incision/Wound:  Lab Results:   Recent Labs  07/28/13 0500 07/29/13 0405  WBC 9.1 8.0  HGB 12.3* 11.5*  HCT 36.0* 34.0*  PLT 244 232   BMET  Recent Labs  07/28/13 0500 07/29/13 0405  NA 134* 133*  K 3.6 3.1*  CL 99 96  CO2 28 29  GLUCOSE 105* 127*  BUN 15 13  CREATININE 0.86 0.76  CALCIUM 8.7 8.6   PT/INR No results found for this basename: LABPROT, INR,  in the last 72 hours ABG No results found for this basename: PHART, PCO2, PO2, HCO3,  in the last 72 hours  Studies/Results: Dg Chest Port 1 View  07/28/2013   CLINICAL DATA:  PICC line placement.  EXAM: PORTABLE CHEST - 1 VIEW  COMPARISON:  07/23/2013  FINDINGS: Right PICC line is in place with the tip at the cavoatrial junction. NG tube is in the proximal stomach. Heart is normal size. Lungs are clear. No effusions. Degenerative changes in the shoulders. No acute bony abnormality.  IMPRESSION: Right PICC line tip at the cavoatrial junction.  No active disease.   Electronically Signed  By: Charlett Nose M.D.   On: 07/28/2013 07:59   Dg Abd 2 Views  07/29/2013   CLINICAL DATA:  Bowel obstruction.  EXAM: ABDOMEN - 2 VIEW  COMPARISON:  July 27, 2013.  FINDINGS: There is a moderate amount of contrast  within normal calibered colon and rectum. There are several loops of mildly distended gas-filled small bowel in the mid abdomen. There is small amount of gas in the stomach. The esophagogastric tube tip in proximal port lie in the region of the gastric cardia. No free extraluminal gas collections are demonstrated.  IMPRESSION: The findings are consistent with a partial distal small bowel obstruction. The volume of fluid within small bowel loops appears to have decreased somewhat while gaseous distention has become more conspicuous.   Electronically Signed   By: David  [place]   On: 07/29/2013 07:38    Anti-infectives: Anti-infectives   Start     Dose/Rate Route Frequency Ordered Stop   07/28/13 2200  vancomycin (VANCOCIN) 1,250 mg in sodium chloride 0.9 % 250 mL IVPB     1,250 mg 166.7 mL/hr over 90 Minutes Intravenous Every 12 hours 07/28/13 0918     07/28/13 1000  vancomycin (VANCOCIN) 500 mg in sodium chloride 0.9 % 100 mL IVPB     500 mg 100 mL/hr over 60 Minutes Intravenous  Once 07/28/13 0917 07/28/13 1058   07/27/13 0800  vancomycin (VANCOCIN) IVPB 750 mg/150 ml premix  Status:  Discontinued     750 mg 150 mL/hr over 60 Minutes Intravenous Every 12 hours 07/26/13 2003 07/28/13 0918   07/26/13 2100  vancomycin (VANCOCIN) IVPB 750 mg/150 ml premix     750 mg 150 mL/hr over 60 Minutes Intravenous  Once 07/26/13 2002 07/26/13 2148   07/24/13 0900  cefTRIAXone (ROCEPHIN) 1 g in dextrose 5 % 50 mL IVPB  Status:  Discontinued     1 g 100 mL/hr over 30 Minutes Intravenous Every 24 hours 07/24/13 0746 07/26/13 1932      Assessment/Plan:  1 - 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 No further intervention while in house.    2 - Small Bowel Obstruction - Encouraged by fact some contrast making it to colon bu KUB today.  Agree with initial conservative measures as recommended by general surgery and hospitalist team, appreciate.  Should pt require abdominal exploration I am happy to assist in anyway. I will be out of the office this week but will be consult rounding prn but am available by phone anytime.    LOS: 6 days    [facility], Clancy Mullarkey 07/29/2013

## 2013-07-29 NOTE — Progress Notes (Signed)
Met with patient and his wife during [facility] admission to provide support and encouragement.  They expressed appreciation.  Young Berry, RN, BSN, [facility] Prostate Oncology Navigator [phone]

## 2013-07-29 NOTE — Progress Notes (Signed)
[facility] PROGRESS NOTE  Keith Bates ZOX:[id number] DOB: Dec 20, 1940 DOA: 07/23/2013 PCP: Park Pope, MD  Assessment/Plan  Small bowel obstruction recurrent. The patient has a small bowel obstruction when he was admitted to the hospital last month for prostatectomy which was complicated by bowel obstruction and urine leak.  CT of abdomen and pelvis demonstrated small bowel obstruction with transition point at the location of the JP drain at the right lower quadrant.  The JP drain was removed by urology, however, the patient continues to suffer from bowel obstruction. -  General Surgery consult:  Appreciate recommendations -  Agree with PICC and TPN and prolonged bowel rest -  Urology assisted with removal of the JP drain  Hyponatremia  Likely iatrogenic due to IVF and resolving -  Continue TPN  Hypokalemia, likely secondary to GI losses.   -  IV supplementation  Leukocytosis due to UTI and resolving on abx. 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, MDR -  vancomycin day #4 -  Repeat urine culture negative  Tachycardia  Resolved with IVF, tx of UTI and SBO  Normocytic anemia likely due to chronic disease - iron studies, B12, folate in AM -  Decrease phlebotomy   Code Status: Full  Family Communication: Patient and sister Disposition Plan: Pending improvement in his small bowel obstruction   Consultants:  Urology   Procedures:  Removal of his JP drain on 12/17    CT of abdomen and pelvis on 12/17  Antibiotics:  Ceftriaxone from 12/17 >> 12/19  Vancomycin 12/19  HPI/Subjective:  Patient says bowels have been quiet, no flatus, less distended  Objective: Filed Vitals:   07/28/13 1400 07/28/13 2144 07/29/13 0503 07/29/13 1555  BP: 118/64 142/98 137/73 134/71  Pulse: 75 77 89 78  Temp: 98.5 F (36.9 C) 98.7 F (37.1 C) 98 F (36.7 C) 98.5 F (36.9 C)  TempSrc: Oral Oral Oral Oral  Resp:  18 18 18 20   Height:      Weight:      SpO2: 98% 99% 99% 100%    Intake/Output Summary (Last 24 hours) at 07/29/13 1710 Last data filed at 07/29/13 1556  Gross per 24 hour  Intake   3070 ml  Output   1250 ml  Net   1820 ml   Filed Weights   07/23/13 2132  Weight: 62.7 kg (138 lb 3.7 oz)    Exam:   General:   Thin African American male, No acute distress  HEENT:  NCAT, MMM  Cardiovascular:  RRR, nl S1, S2 no mrg, 2+ pulses, warm extremities  Respiratory:  CTAB, no increased WOB  Abdomen:    Hypoactive but present bowel sounds, nondistended, nontender to palpation  MSK:   Normal tone and bulk, no LEE  Neuro:  Grossly intact  Data Reviewed: Basic Metabolic Panel:  Recent Labs Lab 07/25/13 0450 07/26/13 0540 07/27/13 0455 07/28/13 0500 07/29/13 0405  NA 130* 132* 132* 134* 133*  K 3.3* 3.9 4.0 3.6 3.1*  CL 92* 95* 94* 99 96  CO2 27 26 28 28 29   GLUCOSE 127* 129* 113* 105* 127*  BUN 19 15 15 15 13   CREATININE 1.12 0.93 0.99 0.86 0.76  CALCIUM 9.6 9.5 9.5 8.7 8.6  MG 2.2  --  2.2 2.1 2.1  PHOS  --   --  3.0 2.9 2.9   Liver Function Tests:  Recent Labs Lab 07/23/13 1245 07/27/13 0455 07/29/13 0405  AST  18 15 11   ALT 23 14 8   ALKPHOS 112 94 68  BILITOT 0.9 0.8 0.3  PROT 8.2 6.9 5.6*  ALBUMIN 4.0 3.2* 2.6*    Recent Labs Lab 07/23/13 1245  LIPASE 48   No results found for this basename: AMMONIA,  in the last 168 hours CBC:  Recent Labs Lab 07/23/13 1245  07/25/13 0450 07/26/13 0540 07/27/13 0455 07/28/13 0500 07/29/13 0405  WBC 10.9*  < > 7.8 7.9 10.6* 9.1 8.0  NEUTROABS 8.4*  --   --   --  7.6  --  5.7  HGB 14.9  < > 13.7 13.7 13.4 12.3* 11.5*  HCT 43.3  < > 40.8 40.8 40.2 36.0* 34.0*  MCV 91.4  < > 91.5 90.7 91.8 90.0 90.7  PLT 374  < > 332 312 316 244 232  < > = values in this interval not displayed. Cardiac Enzymes: No results found for this basename: CKTOTAL, CKMB, CKMBINDEX, TROPONINI,  in the last 168 hours BNP (last 3  results) No results found for this basename: PROBNP,  in the last 8760 hours CBG:  Recent Labs Lab 07/29/13 0030 07/29/13 0452 07/29/13 0735 07/29/13 1209 07/29/13 1649  GLUCAP 134* 121* 113* 107* 114*    Recent Results (from the past 240 hour(s))  URINE CULTURE     Status: None   Collection Time    07/24/13  6:22 AM      Result Value Range Status   Specimen Description URINE, CLEAN CATCH   Final   Special Requests NONE   Final   Culture  Setup Time     Final   Value: 07/24/2013 12:52     Performed at [facility] Count     Final   Value: >=100,000 COLONIES/ML     Performed at [facility]   Culture     Final   Value: STAPHYLOCOCCUS SPECIES (COAGULASE NEGATIVE)     Note: RIFAMPIN AND GENTAMICIN SHOULD NOT BE USED AS SINGLE DRUGS FOR TREATMENT OF STAPH INFECTIONS.     Performed at [facility]   Report Status 07/26/2013 FINAL   Final   Organism ID, Bacteria STAPHYLOCOCCUS SPECIES (COAGULASE NEGATIVE)   Final  URINE CULTURE     Status: None   Collection Time    07/27/13 12:39 AM      Result Value Range Status   Specimen Description URINE, RANDOM   Final   Special Requests NONE   Final   Culture  Setup Time     Final   Value: 07/27/2013 18:55     Performed at [facility] Count     Final   Value: NO GROWTH     Performed at [facility]   Culture     Final   Value: NO GROWTH     Performed at [facility]   Report Status 07/28/2013 FINAL   Final     Studies: Dg Chest Port 1 View  07/28/2013   CLINICAL DATA:  PICC line placement.  EXAM: PORTABLE CHEST - 1 VIEW  COMPARISON:  07/23/2013  FINDINGS: Right PICC line is in place with the tip at the cavoatrial junction. NG tube is in the proximal stomach. Heart is normal size. Lungs are clear. No effusions. Degenerative changes in the shoulders. No acute bony abnormality.  IMPRESSION: Right PICC line tip at the cavoatrial junction.  No active disease.    Electronically Signed   By: Caryn Bee  [place] M.D.   On: 07/28/2013 07:59   Dg Abd 2 Views  07/29/2013   CLINICAL DATA:  Bowel obstruction.  EXAM: ABDOMEN - 2 VIEW  COMPARISON:  July 27, 2013.  FINDINGS: There is a moderate amount of contrast within normal calibered colon and rectum. There are several loops of mildly distended gas-filled small bowel in the mid abdomen. There is small amount of gas in the stomach. The esophagogastric tube tip in proximal port lie in the region of the gastric cardia. No free extraluminal gas collections are demonstrated.  IMPRESSION: The findings are consistent with a partial distal small bowel obstruction. The volume of fluid within small bowel loops appears to have decreased somewhat while gaseous distention has become more conspicuous.   Electronically Signed   By: David  [place]   On: 07/29/2013 07:38    Scheduled Meds: . enoxaparin (LOVENOX) injection  40 mg Subcutaneous Q24H  . fluticasone  2 spray Each Nare Daily  . insulin aspart  0-9 Units Subcutaneous Q4H  . pantoprazole (PROTONIX) IV  40 mg Intravenous Q12H  . phenazopyridine  100 mg Oral TID WC  . sodium chloride  3 mL Intravenous Q12H  . vancomycin  1,250 mg Intravenous Q12H   Continuous Infusions: . sodium chloride 40 mL/hr (07/27/13 1722)  . Marland KitchenTPN (CLINIMIX-E) Adult 65 mL/hr at 07/28/13 1748   And  . fat emulsion 250 mL (07/28/13 1748)  . Marland KitchenTPN (CLINIMIX-E) Adult     And  . fat emulsion      Active Problems:   Small bowel obstruction   SBO (small bowel obstruction)   UTI (urinary tract infection)   Hypokalemia    Time spent: 30 min    Daleysa Kristiansen  [facility] Pager [phone]. If 7PM-7AM, please contact night-coverage at [email or web address], password [facility] 07/29/2013, 5:10 PM  LOS: 6 days

## 2013-07-29 NOTE — Progress Notes (Addendum)
Patient ID: Keith Bates, male   DOB: [birth year], 72 y.o.   MRN: [id number]    Subjective: Pt feels ok today.  Still no flatus or BM.  No pain and feels less distended.  Up walking in the halls while I talk to him.  Objective: Vital signs in last 24 hours: Temp:  [98 F (36.7 C)-98.7 F (37.1 C)] 98 F (36.7 C) (12/22 0503) Pulse Rate:  [75-89] 89 (12/22 0503) Resp:  [18] 18 (12/22 0503) BP: (118-142)/(64-98) 137/73 mmHg (12/22 0503) SpO2:  [98 %-99 %] 99 % (12/22 0503) Last BM Date: 07/21/13  Intake/Output from previous day: 12/21 0701 - 12/22 0700 In: 2620 [P.O.:510; I.V.:960; IV Piggyback:250; TPN:900] Out: 1825 [Urine:675; Emesis/NG output:1150] Intake/Output this shift:    PE: Abd: soft, still distended, but less so, unable to auscultate as patient mobilizes as we talk. NGT is clamped, NT  Lab Results:   Recent Labs  07/28/13 0500 07/29/13 0405  WBC 9.1 8.0  HGB 12.3* 11.5*  HCT 36.0* 34.0*  PLT 244 232   BMET  Recent Labs  07/28/13 0500 07/29/13 0405  NA 134* 133*  K 3.6 3.1*  CL 99 96  CO2 28 29  GLUCOSE 105* 127*  BUN 15 13  CREATININE 0.86 0.76  CALCIUM 8.7 8.6   PT/INR No results found for this basename: LABPROT, INR,  in the last 72 hours CMP     Component Value Date/Time   NA 133* 07/29/2013 0405   K 3.1* 07/29/2013 0405   CL 96 07/29/2013 0405   CO2 29 07/29/2013 0405   GLUCOSE 127* 07/29/2013 0405   BUN 13 07/29/2013 0405   CREATININE 0.76 07/29/2013 0405   CALCIUM 8.6 07/29/2013 0405   PROT 5.6* 07/29/2013 0405   ALBUMIN 2.6* 07/29/2013 0405   AST 11 07/29/2013 0405   ALT 8 07/29/2013 0405   ALKPHOS 68 07/29/2013 0405   BILITOT 0.3 07/29/2013 0405   GFRNONAA 90* 07/29/2013 0405   GFRAA >90 07/29/2013 0405   Lipase     Component Value Date/Time   LIPASE 48 07/23/2013 1245       Studies/Results: Dg Chest Port 1 View  07/28/2013   CLINICAL DATA:  PICC line placement.  EXAM: PORTABLE CHEST - 1 VIEW  COMPARISON:  07/23/2013   FINDINGS: Right PICC line is in place with the tip at the cavoatrial junction. NG tube is in the proximal stomach. Heart is normal size. Lungs are clear. No effusions. Degenerative changes in the shoulders. No acute bony abnormality.  IMPRESSION: Right PICC line tip at the cavoatrial junction.  No active disease.   Electronically Signed   By: Charlett Nose M.D.   On: 07/28/2013 07:59   Dg Abd 2 Views  07/29/2013   CLINICAL DATA:  Bowel obstruction.  EXAM: ABDOMEN - 2 VIEW  COMPARISON:  July 27, 2013.  FINDINGS: There is a moderate amount of contrast within normal calibered colon and rectum. There are several loops of mildly distended gas-filled small bowel in the mid abdomen. There is small amount of gas in the stomach. The esophagogastric tube tip in proximal port lie in the region of the gastric cardia. No free extraluminal gas collections are demonstrated.  IMPRESSION: The findings are consistent with a partial distal small bowel obstruction. The volume of fluid within small bowel loops appears to have decreased somewhat while gaseous distention has become more conspicuous.   Electronically Signed   By: Waverley Krempasky  [place]   On: 07/29/2013 07:38  Anti-infectives: Anti-infectives   Start     Dose/Rate Route Frequency Ordered Stop   07/28/13 2200  vancomycin (VANCOCIN) 1,250 mg in sodium chloride 0.9 % 250 mL IVPB     1,250 mg 166.7 mL/hr over 90 Minutes Intravenous Every 12 hours 07/28/13 0918     07/28/13 1000  vancomycin (VANCOCIN) 500 mg in sodium chloride 0.9 % 100 mL IVPB     500 mg 100 mL/hr over 60 Minutes Intravenous  Once 07/28/13 0917 07/28/13 1058   07/27/13 0800  vancomycin (VANCOCIN) IVPB 750 mg/150 ml premix  Status:  Discontinued     750 mg 150 mL/hr over 60 Minutes Intravenous Every 12 hours 07/26/13 2003 07/28/13 0918   07/26/13 2100  vancomycin (VANCOCIN) IVPB 750 mg/150 ml premix     750 mg 150 mL/hr over 60 Minutes Intravenous  Once 07/26/13 2002 07/26/13 2148   07/24/13  0900  cefTRIAXone (ROCEPHIN) 1 g in dextrose 5 % 50 mL IVPB  Status:  Discontinued     1 g 100 mL/hr over 30 Minutes Intravenous Every 24 hours 07/24/13 0746 07/26/13 1932       Assessment/Plan  1. 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 T3N0Mx prostate cancer, s/p robotic radical laparoscopic assisted prostatectomy with bilateral pelvic lymphadenectomy on 06/21/13 by Dr. Berneice Heinrich 3. PCM/TNA  Plan: 1. The patient's films today show contrast from his CT scan throughout his entire colon.  He still has some pretty dilated loops of small bowel on his film, but overall improved with the passage of contrast.  Cont current treatment with conservative management to see if we can resolve this without needing surgery which carries higher complication rates given recent surgery. 2. Cont TNA while NPO.    LOS: 6 days    OSBORNE,KELLY E 07/29/2013, 9:55 AM Pager: [phone]  Agree with above. Wife in room with patient.   He is better than last week.  No real abdominal pain. KUB still shows some dilated SB loops.  Ovidio Kin, MD, [facility] Surgery Pager: [phone] Office phone:  [phone]

## 2013-07-30 LAB — GLUCOSE, CAPILLARY
Glucose-Capillary: 108 mg/dL — ABNORMAL HIGH (ref 70–99)
Glucose-Capillary: 109 mg/dL — ABNORMAL HIGH (ref 70–99)
Glucose-Capillary: 118 mg/dL — ABNORMAL HIGH (ref 70–99)

## 2013-07-30 LAB — IRON AND TIBC
Iron: 36 ug/dL — ABNORMAL LOW (ref 42–135)
Saturation Ratios: 16 % — ABNORMAL LOW (ref 20–55)
TIBC: 221 ug/dL (ref 215–435)

## 2013-07-30 LAB — VITAMIN B12: Vitamin B-12: 646 pg/mL (ref 211–911)

## 2013-07-30 MED ORDER — TRACE MINERALS CR-CU-F-FE-I-MN-MO-SE-ZN IV SOLN
INTRAVENOUS | Status: AC
  Administered 2013-07-30: 18:00:00 via INTRAVENOUS
  Filled 2013-07-30: qty 2000

## 2013-07-30 MED ORDER — FAT EMULSION 20 % IV EMUL
250.0000 mL | INTRAVENOUS | Status: AC
  Administered 2013-07-30: 250 mL via INTRAVENOUS
  Filled 2013-07-30: qty 250

## 2013-07-30 NOTE — Progress Notes (Signed)
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 TRUS volume 193gm with large median lobe. Path T3bN0Mx with multifocal positive margins including bladder neck, seminal vesicals. Course complicated by urine leak that resolved with prolonged vented suction drainage as confirmed by cystogram.    2 - Small Bowel Obstruction - Pt with new small bowel obstruction by CT 12/17 durnig admission for n/v. No abscess, recurrent leak, bowel perforation. He did not have any direct bowel surgery as part of his prior procedure. Current JP drain felt to be possible nidus and therefore removed as no longer necessary. General surgery consult 12/19 and reccomends conservative measures with continued bowel rest + TPN. KUB 08-18-23 improved with contrast to distal colon and then pt began having large formed BM later 08/18/2023.  Today Keith Bates is feeling improved after large BM x several yesterday. He is very happy with the progress.  Objective: Vital signs in last 24 hours: Temp:  [98.3 F (36.8 C)-98.9 F (37.2 C)] 98.3 F (36.8 C) (12/23 0424) Pulse Rate:  [78-84] 84 (12/23 0424) Resp:  [16-20] 16 (12/23 0424) BP: (123-134)/(71-82) 131/82 mmHg (12/23 0424) SpO2:  [100 %] 100 % (12/23 0424) Last BM Date: 08/17/13  Intake/Output from previous day: 08-18-23 0701 - 12/23 0700 In: 1380 [I.V.:680; IV Piggyback:700] Out: 1300 [Urine:550; Emesis/NG output:750] Intake/Output this shift: Total I/O In: 610 [I.V.:360; IV Piggyback:250] Out: 650 [Urine:250; Emesis/NG output:400]  General appearance: alert, cooperative and appears stated age Head: Normocephalic, without obvious abnormality, atraumatic Eyes: conjunctivae/corneas clear. PERRL, EOM's intact. Fundi benign. Ears: normal TM's and external ear canals both ears Nose: Nares normal. Septum midline. Mucosa normal. No  drainage or sinus tenderness. Throat: lips, mucosa, and tongue normal; teeth and gums normal Neck: no adenopathy, no carotid bruit, no JVD, supple, symmetrical, trachea midline and thyroid not enlarged, symmetric, no tenderness/mass/nodules Back: symmetric, no curvature. ROM normal. No CVA tenderness. Resp: clear to auscultation bilaterally Cardio: regular rate and rhythm, S1, S2 normal, no murmur, click, rub or gallop GI: soft, non-tender; bowel sounds normal; no masses,  no organomegaly and prior abd distention resolved. NGT still in place to [facility] with decreased output. Male genitalia: normal Extremities: extremities normal, atraumatic, no cyanosis or edema Pulses: 2+ and symmetric Skin: Skin color, texture, turgor normal. No rashes or lesions Lymph nodes: Cervical, supraclavicular, and axillary nodes normal. Neurologic: Grossly normal Incision/Wound: Recent port and drain sites c/d/i. No hernias. PICC c/d/i without site erythema.   Lab Results:   Recent Labs  07/28/13 0500 Aug 17, 2013 0405  WBC 9.1 8.0  HGB 12.3* 11.5*  HCT 36.0* 34.0*  PLT 244 232   BMET  Recent Labs  07/28/13 0500 17-Aug-2013 0405  NA 134* 133*  K 3.6 3.1*  CL 99 96  CO2 28 29  GLUCOSE 105* 127*  BUN 15 13  CREATININE 0.86 0.76  CALCIUM 8.7 8.6   PT/INR No results found for this basename: LABPROT, INR,  in the last 72 hours ABG No results found for this basename: PHART, PCO2, PO2, HCO3,  in the last 72 hours  Studies/Results: Dg Abd 2 Views  08-17-2013   CLINICAL DATA:  Bowel obstruction.  EXAM: ABDOMEN - 2 VIEW  COMPARISON:  July 27, 2013.  FINDINGS: There is a moderate amount of contrast within normal calibered colon and rectum. There are several loops of mildly distended gas-filled small bowel in the mid abdomen. There  is small amount of gas in the stomach. The esophagogastric tube tip in proximal port lie in the region of the gastric cardia. No free extraluminal gas collections are demonstrated.   IMPRESSION: The findings are consistent with a partial distal small bowel obstruction. The volume of fluid within small bowel loops appears to have decreased somewhat while gaseous distention has become more conspicuous.   Electronically Signed   By: David  [place]   On: 07/29/2013 07:38    Anti-infectives: Anti-infectives   Start     Dose/Rate Route Frequency Ordered Stop   07/28/13 2200  vancomycin (VANCOCIN) 1,250 mg in sodium chloride 0.9 % 250 mL IVPB     1,250 mg 166.7 mL/hr over 90 Minutes Intravenous Every 12 hours 07/28/13 0918     07/28/13 1000  vancomycin (VANCOCIN) 500 mg in sodium chloride 0.9 % 100 mL IVPB     500 mg 100 mL/hr over 60 Minutes Intravenous  Once 07/28/13 0917 07/28/13 1058   07/27/13 0800  vancomycin (VANCOCIN) IVPB 750 mg/150 ml premix  Status:  Discontinued     750 mg 150 mL/hr over 60 Minutes Intravenous Every 12 hours 07/26/13 2003 07/28/13 0918   07/26/13 2100  vancomycin (VANCOCIN) IVPB 750 mg/150 ml premix     750 mg 150 mL/hr over 60 Minutes Intravenous  Once 07/26/13 2002 07/26/13 2148   07/24/13 0900  cefTRIAXone (ROCEPHIN) 1 g in dextrose 5 % 50 mL IVPB  Status:  Discontinued     1 g 100 mL/hr over 30 Minutes Intravenous Every 24 hours 07/24/13 0746 07/26/13 1932      Assessment/Plan:  1 - 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 No further intervention while in house.    2 - Small Bowel Obstruction - Resolving clinically. This is great news. Would consider clamping or DC NGT and slowly bridge back to diet / wean TPN. I did inform him that although this is favorable, his symptoms could recur.   [facility], Aurther Harlin 07/30/2013

## 2013-07-30 NOTE — Progress Notes (Signed)
Patient ID: Keith Bates, male   DOB: 1940-12-02, 72 y.o.   MRN: [id number]    Subjective: Pt feels well.  Had 3 BM so far since yesterday  Objective: Vital signs in last 24 hours: Temp:  [98.3 F (36.8 C)-98.9 F (37.2 C)] 98.3 F (36.8 C) (12/23 0424) Pulse Rate:  [78-84] 84 (12/23 0424) Resp:  [16-20] 16 (12/23 0424) BP: (123-134)/(71-82) 131/82 mmHg (12/23 0424) SpO2:  [100 %] 100 % (12/23 0424) Last BM Date: 2013-08-05  Intake/Output from previous day: Aug 06, 2023 0701 - 12/23 0700 In: 1700 [I.V.:1000; IV Piggyback:700] Out: 1300 [Urine:550; Emesis/NG output:750] Intake/Output this shift:    PE: Abd: much softer, +BS, incisions c/d/i, NGT with minimal output  Lab Results:   Recent Labs  07/28/13 0500 2013/08/05 0405  WBC 9.1 8.0  HGB 12.3* 11.5*  HCT 36.0* 34.0*  PLT 244 232   BMET  Recent Labs  07/28/13 0500 08/05/2013 0405  NA 134* 133*  K 3.6 3.1*  CL 99 96  CO2 28 29  GLUCOSE 105* 127*  BUN 15 13  CREATININE 0.86 0.76  CALCIUM 8.7 8.6   PT/INR No results found for this basename: LABPROT, INR,  in the last 72 hours CMP     Component Value Date/Time   NA 133* 2013-08-05 0405   K 3.1* 08-05-2013 0405   CL 96 August 05, 2013 0405   CO2 29 08/05/2013 0405   GLUCOSE 127* 2013-08-05 0405   BUN 13 05-Aug-2013 0405   CREATININE 0.76 08-05-2013 0405   CALCIUM 8.6 08-05-13 0405   PROT 5.6* 05-Aug-2013 0405   ALBUMIN 2.6* August 05, 2013 0405   AST 11 08-05-2013 0405   ALT 8 08/05/2013 0405   ALKPHOS 68 08/05/13 0405   BILITOT 0.3 08-05-2013 0405   GFRNONAA 90* 2013-08-05 0405   GFRAA >90 2013-08-05 0405   Lipase     Component Value Date/Time   LIPASE 48 07/23/2013 1245       Studies/Results: Dg Abd 2 Views  Aug 05, 2013   CLINICAL DATA:  Bowel obstruction.  EXAM: ABDOMEN - 2 VIEW  COMPARISON:  July 27, 2013.  FINDINGS: There is a moderate amount of contrast within normal calibered colon and rectum. There are several loops of mildly distended gas-filled  small bowel in the mid abdomen. There is small amount of gas in the stomach. The esophagogastric tube tip in proximal port lie in the region of the gastric cardia. No free extraluminal gas collections are demonstrated.  IMPRESSION: The findings are consistent with a partial distal small bowel obstruction. The volume of fluid within small bowel loops appears to have decreased somewhat while gaseous distention has become more conspicuous.   Electronically Signed   By: David  [place]   On: 08/05/13 07:38    Anti-infectives: Anti-infectives   Start     Dose/Rate Route Frequency Ordered Stop   07/28/13 2200  vancomycin (VANCOCIN) 1,250 mg in sodium chloride 0.9 % 250 mL IVPB     1,250 mg 166.7 mL/hr over 90 Minutes Intravenous Every 12 hours 07/28/13 0918     07/28/13 1000  vancomycin (VANCOCIN) 500 mg in sodium chloride 0.9 % 100 mL IVPB     500 mg 100 mL/hr over 60 Minutes Intravenous  Once 07/28/13 0917 07/28/13 1058   07/27/13 0800  vancomycin (VANCOCIN) IVPB 750 mg/150 ml premix  Status:  Discontinued     750 mg 150 mL/hr over 60 Minutes Intravenous Every 12 hours 07/26/13 2003 07/28/13 0918   07/26/13 2100  vancomycin (VANCOCIN)  IVPB 750 mg/150 ml premix     750 mg 150 mL/hr over 60 Minutes Intravenous  Once 07/26/13 2002 07/26/13 2148   07/24/13 0900  cefTRIAXone (ROCEPHIN) 1 g in dextrose 5 % 50 mL IVPB  Status:  Discontinued     1 g 100 mL/hr over 30 Minutes Intravenous Every 24 hours 07/24/13 0746 07/26/13 1932       Assessment/Plan  1. SBO 2. S/p prostatectomy 3. PCM/TNA  Plan: 1. Patient seems to be improving.  I will clamp his NGT today to see how he does.  If he does well, hopefully we can dc this tomorrow. 2. Cont TNA for nutritional support  LOS: 7 days    Kaely Hollan E 07/30/2013, 11:36 AM Pager: [phone]

## 2013-07-30 NOTE — Progress Notes (Signed)
Agree with A&P of KO,PA. He has tolerated NG clamped so far with no nausea

## 2013-07-30 NOTE — Progress Notes (Signed)
[facility] PROGRESS NOTE  Keith Bates WUJ:[id number] DOB: [birth year] DOA: 07/23/2013 PCP: Park Pope, MD  Assessment/Plan  Small bowel obstruction recurrent. The patient has a small bowel obstruction when he was admitted to the hospital last month for prostatectomy which was complicated by bowel obstruction and urine leak.  CT of abdomen and pelvis demonstrated small bowel obstruction with transition point at the location of the JP drain at the right lower quadrant.  The JP drain was removed by urology, however, the patient continued to suffer from bowel obstruction so general surgery was consulted.  TPN was started.  Started passing gas and having BMs on 12/22.  Still having nausea and abdominal pain -  General Surgery consult:  Appreciate recommendations -   Continue TPN -  NG clamped today  Hyponatremia  Likely iatrogenic and stable -  Continue TPN  Hypokalemia, likely secondary to GI losses.  Receiving intermittent IV supplementation  Leukocytosis due to UTI and resolving on abx. 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, MDR -  vancomycin day #5/7 -  Repeat urine culture negative  Tachycardia  Resolved with IVF, tx of UTI and SBO  Normocytic anemia likely due to chronic disease - iron studies consistent with chronic disease, B12 646, folate pending -  Decrease phlebotomy   Code Status: Full  Family Communication: Patient and sister Disposition Plan: Pending improvement in his small bowel obstruction   Consultants:  Urology   Procedures:  Removal of his JP drain on 12/17    CT of abdomen and pelvis on 12/17  Antibiotics:  Ceftriaxone from 12/17 >> 12/19  Vancomycin 12/19  HPI/Subjective:  Flatus and BMs overnight.  Having RLQ cramping and persistent nausea despite NG tube to suction.  Walking frequently around halls.    Objective: Filed Vitals:   07/29/13 0503 07/29/13 1555 07/29/13 2003  07/30/13 0424  BP: 137/73 134/71 123/80 131/82  Pulse: 89 78 84 84  Temp: 98 F (36.7 C) 98.5 F (36.9 C) 98.9 F (37.2 C) 98.3 F (36.8 C)  TempSrc: Oral Oral Oral Oral  Resp: 18 20 16 16   Height:      Weight:      SpO2: 99% 100% 100% 100%    Intake/Output Summary (Last 24 hours) at 07/30/13 1310 Last data filed at 07/30/13 0745  Gross per 24 hour  Intake   1090 ml  Output   1300 ml  Net   -210 ml   Filed Weights   07/23/13 2132  Weight: 62.7 kg (138 lb 3.7 oz)    Exam:   General:   Thin African American male, No acute distress  HEENT:  NCAT, MMM  Cardiovascular:  RRR, nl S1, S2 no mrg, 2+ pulses, warm extremities  Respiratory:  CTAB, no increased WOB  Abdomen:    Hypoactive but present bowel sounds, nondistended, nontender to palpation  MSK:   Normal tone and bulk, no LEE  Neuro:  Grossly intact  Data Reviewed: Basic Metabolic Panel:  Recent Labs Lab 07/25/13 0450 07/26/13 0540 07/27/13 0455 07/28/13 0500 07/29/13 0405  NA 130* 132* 132* 134* 133*  K 3.3* 3.9 4.0 3.6 3.1*  CL 92* 95* 94* 99 96  CO2 27 26 28 28 29   GLUCOSE 127* 129* 113* 105* 127*  BUN 19 15 15 15 13   CREATININE 1.12 0.93 0.99 0.86 0.76  CALCIUM 9.6 9.5 9.5 8.7 8.6  MG 2.2  --  2.2 2.1 2.1  PHOS  --   --  3.0 2.9 2.9   Liver Function Tests:  Recent Labs Lab 07/27/13 0455 07/29/13 0405  AST 15 11  ALT 14 8  ALKPHOS 94 68  BILITOT 0.8 0.3  PROT 6.9 5.6*  ALBUMIN 3.2* 2.6*   No results found for this basename: LIPASE, AMYLASE,  in the last 168 hours No results found for this basename: AMMONIA,  in the last 168 hours CBC:  Recent Labs Lab 07/25/13 0450 07/26/13 0540 07/27/13 0455 07/28/13 0500 07/29/13 0405  WBC 7.8 7.9 10.6* 9.1 8.0  NEUTROABS  --   --  7.6  --  5.7  HGB 13.7 13.7 13.4 12.3* 11.5*  HCT 40.8 40.8 40.2 36.0* 34.0*  MCV 91.5 90.7 91.8 90.0 90.7  PLT 332 312 316 244 232   Cardiac Enzymes: No results found for this basename: CKTOTAL, CKMB,  CKMBINDEX, TROPONINI,  in the last 168 hours BNP (last 3 results) No results found for this basename: PROBNP,  in the last 8760 hours CBG:  Recent Labs Lab 07/29/13 1950 07/30/13 0029 07/30/13 0423 07/30/13 0718 07/30/13 1121  GLUCAP 107* 108* 104* 124* 118*    Recent Results (from the past 240 hour(s))  URINE CULTURE     Status: None   Collection Time    07/24/13  6:22 AM      Result Value Range Status   Specimen Description URINE, CLEAN CATCH   Final   Special Requests NONE   Final   Culture  Setup Time     Final   Value: 07/24/2013 12:52     Performed at [facility] Count     Final   Value: >=100,000 COLONIES/ML     Performed at [facility]   Culture     Final   Value: STAPHYLOCOCCUS SPECIES (COAGULASE NEGATIVE)     Note: RIFAMPIN AND GENTAMICIN SHOULD NOT BE USED AS SINGLE DRUGS FOR TREATMENT OF STAPH INFECTIONS.     Performed at [facility]   Report Status 07/26/2013 FINAL   Final   Organism ID, Bacteria STAPHYLOCOCCUS SPECIES (COAGULASE NEGATIVE)   Final  URINE CULTURE     Status: None   Collection Time    07/27/13 12:39 AM      Result Value Range Status   Specimen Description URINE, RANDOM   Final   Special Requests NONE   Final   Culture  Setup Time     Final   Value: 07/27/2013 18:55     Performed at [facility] Count     Final   Value: NO GROWTH     Performed at [facility]   Culture     Final   Value: NO GROWTH     Performed at [facility]   Report Status 07/28/2013 FINAL   Final     Studies: Dg Abd 2 Views  07/29/2013   CLINICAL DATA:  Bowel obstruction.  EXAM: ABDOMEN - 2 VIEW  COMPARISON:  July 27, 2013.  FINDINGS: There is a moderate amount of contrast within normal calibered colon and rectum. There are several loops of mildly distended gas-filled small bowel in the mid abdomen. There is small amount of gas in the stomach. The esophagogastric tube tip in proximal port lie  in the region of the gastric cardia. No free extraluminal gas collections are demonstrated.  IMPRESSION: The findings are consistent with a partial distal small bowel obstruction. The volume of  fluid within small bowel loops appears to have decreased somewhat while gaseous distention has become more conspicuous.   Electronically Signed   By: David  [place]   On: 07/29/2013 07:38    Scheduled Meds: . enoxaparin (LOVENOX) injection  40 mg Subcutaneous Q24H  . fluticasone  2 spray Each Nare Daily  . insulin aspart  0-9 Units Subcutaneous Q4H  . pantoprazole (PROTONIX) IV  40 mg Intravenous Q12H  . sodium chloride  3 mL Intravenous Q12H  . vancomycin  1,250 mg Intravenous Q12H   Continuous Infusions: . sodium chloride 40 mL/hr at 07/30/13 0355  . Marland KitchenTPN (CLINIMIX-E) Adult 65 mL/hr at 07/29/13 1751   And  . fat emulsion 250 mL (07/29/13 1752)  . Marland KitchenTPN (CLINIMIX-E) Adult     And  . fat emulsion      Active Problems:   Small bowel obstruction   SBO (small bowel obstruction)   UTI (urinary tract infection)   Hypokalemia   Normocytic anemia    Time spent: 30 min    Addelyn Alleman  [facility] Pager [phone]. If 7PM-7AM, please contact night-coverage at [email or web address], password [facility] 07/30/2013, 1:10 PM  LOS: 7 days

## 2013-07-30 NOTE — Progress Notes (Signed)
PARENTERAL NUTRITION CONSULT NOTE - Follow Up  Pharmacy Consult for TNA  Indication: SBO  Allergies  Allergen Reactions  . 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 Plasticized Base [Plastibase] Rash  . Penicillins Rash    Patient Measurements: Height: 5\' 7"  (170.2 cm) Weight: 138 lb 3.7 oz (62.7 kg) IBW/kg (Calculated) : 66.1 Usual Weight: 71.4 kg  Vital Signs: Temp: 98.3 F (36.8 C) (12/23 0424) Temp src: Oral (12/23 0424) BP: 131/82 mmHg (12/23 0424) Pulse Rate: 84 (12/23 0424) Intake/Output from previous day: 12/22 0701 - 12/23 0700 In: 1700 [I.V.:1000; IV Piggyback:700] Out: 1300 [Urine:550; Emesis/NG output:750]  Labs:  Recent Labs  07/28/13 0500 07/29/13 0405  WBC 9.1 8.0  HGB 12.3* 11.5*  HCT 36.0* 34.0*  PLT 244 232     Recent Labs  07/28/13 0500 07/29/13 0405 07/29/13 0409  NA 134* 133*  --   K 3.6 3.1*  --   CL 99 96  --   CO2 28 29  --   GLUCOSE 105* 127*  --   BUN 15 13  --   CREATININE 0.86 0.76  --   CALCIUM 8.7 8.6  --   MG 2.1 2.1  --   PHOS 2.9 2.9  --   PROT  --  5.6*  --   ALBUMIN  --  2.6*  --   AST  --  11  --   ALT  --  8  --   ALKPHOS  --  68  --   BILITOT  --  0.3  --   PREALBUMIN  --  16.8*  --   TRIG  --   --  86   Estimated Creatinine Clearance: 75.1 ml/min (by C-G formula based on Cr of 0.76).    Recent Labs  07/30/13 0029 07/30/13 0423 07/30/13 0718  GLUCAP 108* 104* 124*    Nutritional Goals:   RD recs 12/21: 1700-1900 Kcal, 75-85 g protein and 1.7-1.9 L fluid / day.  Clinimix 5/20 at a goal rate of 65 ml/hr + 20% fat emulsion at 10 ml/hr to provide: 78 g/day protein, 1853 Kcal/day.  Current nutrition:   Diet: NPO  IVF: NS @ 40 ml/hr  Clinimix 5/20 + Lipids at goal rate  CBGs & Insulin requirements past 24 hours:   CBG range: 104-124  1 unit of sensitive SSI  Assessment:  66 yoM with history of prostate cancer, small bowel obstruction, recent prolonged hospital stay and  discharged on 07/09/13, s/p robotic radical prostatectomy with bilateral pelvic lymphadenectomy 06/21/13, admitted 07/23/13 progressively worsening abdominal distention and coffee ground emesis that initially started 3 days prior to this admission.  CT scan revealed bowel obstruction.  Per surgery, patient to need 7-10 more days of bowel rest prior to attempting further surgery.  Starting TNA 12/20.   TPN Access: PICC line placed 12/19  TPN day# 4.  NG output decreased to 750 ml, net I/O +400 mL.  BM x 3 since yesterday and seems to be improving per CCS.  Plan to clamp NGT today and maybe d/c tomorrow.  Continue TPN today, but look to start weaning soon.  Labs:  Renal function: SCr 0.76, stable  Hepatic function: LFTs WNL (12/22)  Electrolytes: Na 133 (unable to adjust in TPN), K 3.1 (replaced per MD order).  Other WNL.  Pre-Albumin:  21.3 (12/20)  TG:  79 (12/20)  Glucose: CBGs at goal < 150   Plan:  At 1800 today:  Continue Clinimix E 5/20 at 65 ml/hr.   TNA to contain standard multivitamins and trace elements daily.  Continue 20% fat emulsion at 10 ml/hr.  IVF per MD orders.  Continue CBG checks with sensitive SSI q4h.   TNA lab panels on Mondays & Thursdays.  F/u daily.   Lynann Beaver PharmD, BCPS Pager [phone] 07/30/2013 12:09 PM

## 2013-07-31 LAB — BASIC METABOLIC PANEL
BUN: 12 mg/dL (ref 6–23)
CO2: 24 mEq/L (ref 19–32)
Calcium: 7.4 mg/dL — ABNORMAL LOW (ref 8.4–10.5)
Chloride: 105 mEq/L (ref 96–112)
GFR calc Af Amer: >90 mL/min (ref >90)
Potassium: 2.7 mEq/L — CL (ref 3.5–5.1)

## 2013-07-31 LAB — GLUCOSE, CAPILLARY
Glucose-Capillary: 111 mg/dL — ABNORMAL HIGH (ref 70–99)
Glucose-Capillary: 112 mg/dL — ABNORMAL HIGH (ref 70–99)
Glucose-Capillary: 113 mg/dL — ABNORMAL HIGH (ref 70–99)
Glucose-Capillary: 114 mg/dL — ABNORMAL HIGH (ref 70–99)
Glucose-Capillary: 91 mg/dL (ref 70–99)

## 2013-07-31 MED ORDER — TRACE MINERALS CR-CU-F-FE-I-MN-MO-SE-ZN IV SOLN
INTRAVENOUS | Status: DC
  Administered 2013-07-31: 18:00:00 via INTRAVENOUS
  Filled 2013-07-31: qty 2000

## 2013-07-31 MED ORDER — POTASSIUM CHLORIDE IN NACL 20-0.45 MEQ/L-% IV SOLN
INTRAVENOUS | Status: DC
  Administered 2013-07-31: 11:00:00 via INTRAVENOUS
  Filled 2013-07-31 (×2): qty 1000

## 2013-07-31 MED ORDER — POTASSIUM CHLORIDE 10 MEQ/100ML IV SOLN
10.0000 meq | INTRAVENOUS | Status: AC
  Administered 2013-07-31 (×6): 10 meq via INTRAVENOUS
  Filled 2013-07-31 (×6): qty 100

## 2013-07-31 MED ORDER — FAT EMULSION 20 % IV EMUL
250.0000 mL | INTRAVENOUS | Status: DC
  Administered 2013-07-31: 18:00:00 250 mL via INTRAVENOUS
  Filled 2013-07-31: qty 250

## 2013-07-31 NOTE — Progress Notes (Signed)
PARENTERAL NUTRITION CONSULT NOTE - Follow Up  Pharmacy Consult for TNA  Indication: SBO  Allergies  Allergen Reactions  . 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 Plasticized Base [Plastibase] Rash  . Penicillins Rash    Patient Measurements: Height: 5\' 7"  (170.2 cm) Weight: 138 lb 3.7 oz (62.7 kg) IBW/kg (Calculated) : 66.1 Usual Weight: 71.4 kg  Vital Signs: Temp: 98.5 F (36.9 C) (12/24 0434) Temp src: Oral (12/24 0434) BP: 123/63 mmHg (12/24 0434) Pulse Rate: 70 (12/24 0434) Intake/Output from previous day: 12/23 0701 - 12/24 0700 In: 4740 [I.V.:640; IV Piggyback:500; TPN:3600] Out: 1060 [Urine:860; Emesis/NG output:200]  Labs:  Recent Labs  07/29/13 0405  WBC 8.0  HGB 11.5*  HCT 34.0*  PLT 232     Recent Labs  07/29/13 0405 07/29/13 0409 07/31/13 0615  NA 133*  --  134*  K 3.1*  --  2.7*  CL 96  --  105  CO2 29  --  24  GLUCOSE 127*  --  103*  BUN 13  --  12  CREATININE 0.76  --  0.62  CALCIUM 8.6  --  7.4*  MG 2.1  --   --   PHOS 2.9  --   --   PROT 5.6*  --   --   ALBUMIN 2.6*  --   --   AST 11  --   --   ALT 8  --   --   ALKPHOS 68  --   --   BILITOT 0.3  --   --   PREALBUMIN 16.8*  --   --   TRIG  --  86  --    Estimated Creatinine Clearance: 75.1 ml/min (by C-G formula based on Cr of 0.62).    Recent Labs  07/30/13 2013 07/31/13 0025 07/31/13 0432  GLUCAP 100* 114* 118*    Nutritional Goals:   RD recs 12/21: 1700-1900 Kcal, 75-85 g protein and 1.7-1.9 L fluid / day.  Clinimix 5/20 at a goal rate of 65 ml/hr + 20% fat emulsion at 10 ml/hr to provide: 78 g/day protein, 1853 Kcal/day.  Current nutrition:   Diet: Clear liquids (from floor only, no tray.  Starting 12/24)  IVF: 1/2 NS + KCl/L @ 40 ml/hr  Clinimix 5/20 + Lipids at goal rate  CBGs & Insulin requirements past 24 hours:   CBG range: 100-124  1 unit of sensitive SSI  Assessment:  35 yoM with history of prostate cancer, small bowel  obstruction, recent prolonged hospital stay and discharged on 07/09/13, s/p robotic radical prostatectomy with bilateral pelvic lymphadenectomy 06/21/13, admitted 07/23/13 progressively worsening abdominal distention and coffee ground emesis that initially started 3 days prior to this admission.  CT scan revealed bowel obstruction.  Per surgery, patient to need 7-10 more days of bowel rest prior to attempting further surgery.  Starting TNA 12/20.   TPN Access: PICC line placed 12/19  TPN day# 5.  NGT removed.  BM x 2 on 12/22 and x1 on 12/23.  Continue TPN today, but look to start weaning tomorrow if tolerating PO.  Hypokalemic today, but should improve with KCl supplements, KCl added to IVF, and NGT removed.  Labs:  Renal function: SCr 0.7,2 stable  Hepatic function: LFTs WNL (12/22)  Electrolytes: Na 134 (unable to adjust in TPN), K 2.7 (replacing), Corrected Ca 8.5, Other WNL.  Pre-Albumin:  21.3 (12/20), 16.8 (12/22)  TG:  86 (12/22)  Glucose: CBGs  at goal < 150   Plan:  At 1800 today:  Continue Clinimix E 5/20 at 65 ml/hr.   TNA to contain standard multivitamins and trace elements daily.  Continue 20% fat emulsion at 10 ml/hr.  IVF per MD orders.  Continue CBG checks with sensitive SSI q4h.   TNA lab panels on Mondays & Thursdays.  F/u daily.   Lynann Beaver PharmD, BCPS Pager [phone] 07/31/2013 9:58 AM

## 2013-07-31 NOTE — Progress Notes (Signed)
[facility] PROGRESS NOTE  Keith Bates JYN:[id number] DOB: 15-Nov-1940 DOA: 07/23/2013 PCP: Park Pope, MD  Assessment/Plan  Small bowel obstruction recurrent. The patient has a small bowel obstruction when he was admitted to the hospital last month for prostatectomy which was complicated by bowel obstruction and urine leak.  CT of abdomen and pelvis demonstrated small bowel obstruction with transition point at the location of the JP drain at the right lower quadrant.  The JP drain was removed by urology, however, the patient continued to suffer from bowel obstruction so general surgery was consulted.  TPN was started.  Started passing gas and having BMs on 12/22.   -  General Surgery consult:  Appreciate recommendations - Diet being advanced -  NG tube removed today and patient started on clear liquids  Hyponatremia  Likely iatrogenic and stable -  Continue TPN  Hypokalemia, likely secondary to GI losses.  Receiving intermittent IV supplementation  Leukocytosis due to UTI and resolving on abx. 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, MDR -  vancomycin day #6/7 -  Repeat urine culture negative  Tachycardia  Resolved with IVF, tx of UTI and SBO  Normocytic anemia likely due to chronic disease - iron studies consistent with chronic disease, B12 646, folate pending -  Decrease phlebotomy   Code Status: Full  Family Communication: Plan discussed with sister yesterday Disposition Plan: Likely tomorrow   Consultants:  Urology   General surgery  Procedures:  Removal of his JP drain on 12/17    CT of abdomen and pelvis on 12/17  Antibiotics:  Ceftriaxone from 12/17 >> 12/19  Vancomycin 12/19-12/25  HPI/Subjective:  Continues to have flatus and movements. No complaints of an occasional gas  Objective: Filed Vitals:   07/30/13 1350 07/30/13 2015 07/31/13 0434 07/31/13 1320  BP: 129/75 124/71 123/63  117/74  Pulse: 79 70 70 68  Temp: 98.2 F (36.8 C) 98.9 F (37.2 C) 98.5 F (36.9 C) 98 F (36.7 C)  TempSrc: Oral Oral Oral Oral  Resp: 16 16 16 16   Height:      Weight:      SpO2: 99% 100% 100% 100%    Intake/Output Summary (Last 24 hours) at 07/31/13 1735 Last data filed at 07/31/13 1500  Gross per 24 hour  Intake 3847.5 ml  Output   1610 ml  Net 2237.5 ml   Filed Weights   07/23/13 2132  Weight: 62.7 kg (138 lb 3.7 oz)    Exam:   General:    No acute distress  HEENT:  Normocephalic, atraumatic, mucous membranes are moist  Cardiovascular:  RRR, nl S1, S2   Respiratory:  Clear to auscultation bilaterally  Abdomen:    Hypoactive but present bowel sounds, nondistended, nontender to palpation  MSK:   Normal tone and bulk, no LEE    Data Reviewed: Basic Metabolic Panel:  Recent Labs Lab 07/25/13 0450 07/26/13 0540 07/27/13 0455 07/28/13 0500 07/29/13 0405 07/31/13 0615  NA 130* 132* 132* 134* 133* 134*  K 3.3* 3.9 4.0 3.6 3.1* 2.7*  CL 92* 95* 94* 99 96 105  CO2 27 26 28 28 29 24   GLUCOSE 127* 129* 113* 105* 127* 103*  BUN 19 15 15 15 13 12   CREATININE 1.12 0.93 0.99 0.86 0.76 0.62  CALCIUM 9.6 9.5 9.5 8.7 8.6 7.4*  MG 2.2  --  2.2 2.1 2.1  --   PHOS  --   --  3.0  2.9 2.9  --    Liver Function Tests:  Recent Labs Lab 07/27/13 0455 07/29/13 0405  AST 15 11  ALT 14 8  ALKPHOS 94 68  BILITOT 0.8 0.3  PROT 6.9 5.6*  ALBUMIN 3.2* 2.6*   No results found for this basename: LIPASE, AMYLASE,  in the last 168 hours No results found for this basename: AMMONIA,  in the last 168 hours CBC:  Recent Labs Lab 07/25/13 0450 07/26/13 0540 07/27/13 0455 07/28/13 0500 07/29/13 0405  WBC 7.8 7.9 10.6* 9.1 8.0  NEUTROABS  --   --  7.6  --  5.7  HGB 13.7 13.7 13.4 12.3* 11.5*  HCT 40.8 40.8 40.2 36.0* 34.0*  MCV 91.5 90.7 91.8 90.0 90.7  PLT 332 312 316 244 232   Cardiac Enzymes: No results found for this basename: CKTOTAL, CKMB, CKMBINDEX,  TROPONINI,  in the last 168 hours BNP (last 3 results) No results found for this basename: PROBNP,  in the last 8760 hours CBG:  Recent Labs Lab 07/31/13 0025 07/31/13 0432 07/31/13 0809 07/31/13 1233 07/31/13 1707  GLUCAP 114* 118* 112* 113* 91    Recent Results (from the past 240 hour(s))  URINE CULTURE     Status: None   Collection Time    07/24/13  6:22 AM      Result Value Range Status   Specimen Description URINE, CLEAN CATCH   Final   Special Requests NONE   Final   Culture  Setup Time     Final   Value: 07/24/2013 12:52     Performed at [facility] Count     Final   Value: >=100,000 COLONIES/ML     Performed at [facility]   Culture     Final   Value: STAPHYLOCOCCUS SPECIES (COAGULASE NEGATIVE)     Note: RIFAMPIN AND GENTAMICIN SHOULD NOT BE USED AS SINGLE DRUGS FOR TREATMENT OF STAPH INFECTIONS.     Performed at [facility]   Report Status 07/26/2013 FINAL   Final   Organism ID, Bacteria STAPHYLOCOCCUS SPECIES (COAGULASE NEGATIVE)   Final  URINE CULTURE     Status: None   Collection Time    07/27/13 12:39 AM      Result Value Range Status   Specimen Description URINE, RANDOM   Final   Special Requests NONE   Final   Culture  Setup Time     Final   Value: 07/27/2013 18:55     Performed at [facility] Count     Final   Value: NO GROWTH     Performed at [facility]   Culture     Final   Value: NO GROWTH     Performed at [facility]   Report Status 07/28/2013 FINAL   Final     Studies: No results found.  Scheduled Meds: . enoxaparin (LOVENOX) injection  40 mg Subcutaneous Q24H  . fluticasone  2 spray Each Nare Daily  . insulin aspart  0-9 Units Subcutaneous Q4H  . pantoprazole (PROTONIX) IV  40 mg Intravenous Q12H  . sodium chloride  3 mL Intravenous Q12H  . vancomycin  1,250 mg Intravenous Q12H   Continuous Infusions: . 0.45 % NaCl with KCl 20 mEq / L 40 mL/hr at 07/31/13 1030   . Marland KitchenTPN (CLINIMIX-E) Adult 65 mL/hr at 07/30/13 1751   And  . fat emulsion 250 mL (07/30/13 1751)  . Marland KitchenTPN (CLINIMIX-E) Adult 5  mL/hr at 07/31/13 1730   And  . fat emulsion 250 mL (07/31/13 1731)    Active Problems:   Small bowel obstruction   SBO (small bowel obstruction)   UTI (urinary tract infection)   Hypokalemia   Normocytic anemia    Time spent: 20 min    Hollice Espy  [facility] Pager [phone]. If 7PM-7AM, please contact night-coverage at [email or web address], password [facility] 07/31/2013, 5:35 PM  LOS: 8 days

## 2013-07-31 NOTE — Progress Notes (Signed)
General Surgery Note  LOS: 8 days  POD -     Assessment/Plan: 1.  Small bowel obstruction  Seems to be turning the corner.  Had 3 BM's last 24 hours.  Will pull NGT and start sips from the floor.  If tolerated, then can go to clear liquids later today or tomorrow.  2.  S/p robotic prostatectomy/lymph node dissection for T3N0 prostate ca - 06/21/2013 - T. Manny 3.  DVT prophylaxis - Lovenox 4.  UIT - coag neg staph aureus   On Vanc 5.  Hypokalemia - 2.7 - 07/31/2013  Has 6 K+ runs ordered 6.  On TPN   Active Problems:   Small bowel obstruction   SBO (small bowel obstruction)   UTI (urinary tract infection)   Hypokalemia   Normocytic anemia  Subjective:  Doing well.  Had 3 BM's last 24 hours.  No nausea or pain. Objective:   Filed Vitals:   07/31/13 0434  BP: 123/63  Pulse: 70  Temp: 98.5 F (36.9 C)  Resp: 16     Intake/Output from previous day:  12/23 0701 - 12/24 0700 In: 4740 [I.V.:640; IV Piggyback:500; TPN:3600] Out: 1060 [Urine:860; Emesis/NG output:200]  Intake/Output this shift:      Physical Exam:   General: WN thin AA M who is alert and oriented.    HEENT: Normal. Pupils equal. .   Lungs: Clear.   Abdomen: Soft.  Has BS.  No tenderness.   Lab Results:    Recent Labs  07/29/13 0405  WBC 8.0  HGB 11.5*  HCT 34.0*  PLT 232    BMET   Recent Labs  07/29/13 0405 07/31/13 0615  NA 133* 134*  K 3.1* 2.7*  CL 96 105  CO2 29 24  GLUCOSE 127* 103*  BUN 13 12  CREATININE 0.76 0.62  CALCIUM 8.6 7.4*    PT/INR  No results found for this basename: LABPROT, INR,  in the last 72 hours  ABG  No results found for this basename: PHART, PCO2, PO2, HCO3,  in the last 72 hours   Studies/Results:  No results found.   Anti-infectives:   Anti-infectives   Start     Dose/Rate Route Frequency Ordered Stop   07/28/13 2200  vancomycin (VANCOCIN) 1,250 mg in sodium chloride 0.9 % 250 mL IVPB     1,250 mg 166.7 mL/hr over 90 Minutes Intravenous Every  12 hours 07/28/13 0918     07/28/13 1000  vancomycin (VANCOCIN) 500 mg in sodium chloride 0.9 % 100 mL IVPB     500 mg 100 mL/hr over 60 Minutes Intravenous  Once 07/28/13 0917 07/28/13 1058   07/27/13 0800  vancomycin (VANCOCIN) IVPB 750 mg/150 ml premix  Status:  Discontinued     750 mg 150 mL/hr over 60 Minutes Intravenous Every 12 hours 07/26/13 2003 07/28/13 0918   07/26/13 2100  vancomycin (VANCOCIN) IVPB 750 mg/150 ml premix     750 mg 150 mL/hr over 60 Minutes Intravenous  Once 07/26/13 2002 07/26/13 2148   07/24/13 0900  cefTRIAXone (ROCEPHIN) 1 g in dextrose 5 % 50 mL IVPB  Status:  Discontinued     1 g 100 mL/hr over 30 Minutes Intravenous Every 24 hours 07/24/13 0746 07/26/13 1932      Ovidio Kin, MD, FACS Pager: [phone] [facility] [place] [facility] Office: [phone] 07/31/2013

## 2013-07-31 NOTE — Progress Notes (Signed)
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 Plasticized Base [Plastibase] Rash  . Penicillins Rash    Patient Measurements: Height: 5\' 7"  (170.2 cm) Weight: 138 lb 3.7 oz (62.7 kg) IBW/kg (Calculated) : 66.1  Vital Signs: Temp: 98.5 F (36.9 C) (12/24 0434) Temp src: Oral (12/24 0434) BP: 123/63 mmHg (12/24 0434) Pulse Rate: 70 (12/24 0434) Intake/Output from previous day: 12/23 0701 - 12/24 0700 In: 4740 [I.V.:640; IV Piggyback:500; TPN:3600] Out: 1060 [Urine:860; Emesis/NG output:200]  Labs:  Recent Labs  07/29/13 0405 07/31/13 0615  WBC 8.0  --   HGB 11.5*  --   PLT 232  --   CREATININE 0.76 0.62   Estimated Creatinine Clearance: 75.1 ml/min (by C-G formula based on Cr of 0.62).   Microbiology: Recent Results (from the past 720 hour(s))  URINE CULTURE     Status: None   Collection Time    07/08/13  9:23 AM      Result Value Range Status   Specimen Description URINE, CATHETERIZED   Final   Special Requests NONE   Final   Culture  Setup Time     Final   Value: 07/08/2013 13:05     Performed at [facility] Count     Final   Value: >=100,000 COLONIES/ML     Performed at [facility]   Culture     Final   Value: KLEBSIELLA PNEUMONIAE     ENTEROCOCCUS SPECIES     Performed at [facility]   Report Status 07/11/2013 FINAL   Final   Organism ID, Bacteria KLEBSIELLA PNEUMONIAE   Final   Organism ID, Bacteria ENTEROCOCCUS SPECIES   Final  URINE CULTURE     Status: None   Collection Time    07/24/13  6:22 AM      Result Value Range Status   Specimen Description URINE, CLEAN CATCH   Final   Special Requests NONE   Final   Culture  Setup Time     Final   Value: 07/24/2013 12:52     Performed at [facility] Count     Final   Value: >=100,000 COLONIES/ML     Performed at [facility]   Culture     Final   Value: STAPHYLOCOCCUS SPECIES (COAGULASE NEGATIVE)     Note: RIFAMPIN AND GENTAMICIN SHOULD NOT BE USED AS SINGLE DRUGS FOR TREATMENT OF STAPH INFECTIONS.     Performed at [facility]   Report Status 07/26/2013 FINAL   Final   Organism ID, Bacteria STAPHYLOCOCCUS SPECIES (COAGULASE NEGATIVE)   Final  URINE CULTURE     Status: None   Collection Time    07/27/13 12:39 AM      Result Value Range Status   Specimen Description URINE, RANDOM   Final   Special Requests NONE   Final   Culture  Setup Time     Final   Value: 07/27/2013 18:55     Performed at [facility] Count     Final   Value: NO GROWTH     Performed at [facility]   Culture     Final   Value: NO GROWTH     Performed at [facility]   Report Status  07/28/2013 FINAL   Final   Assessment: 55 yoM admitted 12/16 for SBO.  Pharmacy consulted 12/19 to dose vancomycin for CoNS UTI- MDR and inability to take PO medications.   Note patient treated for recent Klebsiella and enterococcus UTI with Bactrim x 10 days starting 12/2.  Day #6/7 Vancomycin    Vancomycin trough: 6.5 mcg/ml, dose was increased on 12/21.  Repeat VT will be obtained if vancomycin is continued beyond 7 days.  Tmax: Afebrile  WBCs: WNL  Renal: SCr 0.62, CrCl 75 mL/min (GC)  Goal of Therapy:  Vancomycin trough level 10-15 mcg/ml  Plan:   Continue Vancomycin 1250 mg IV q12h.  Measure Vanc trough at steady state as needed.  Follow up renal fxn and culture results.   Lynann Beaver PharmD, BCPS Pager [phone] 07/31/2013 7:32 AM

## 2013-07-31 NOTE — Progress Notes (Signed)
NG tube pulled by MD this AM, patient started on clear liq tolerated it well denies any distress, no N/V, will continue to assess patient.

## 2013-07-31 NOTE — Progress Notes (Signed)
CRITICAL VALUE ALERT  Critical value received:  Potassium 2.7  Date of notification:  07/31/13  Time of notification:  0716  Critical value read back:yes  Nurse who received alert:  J. Rimando  MD notified (1st page):  Dr. Rito Ehrlich  Time of first page:  0745  MD notified (2nd page):  Time of second page:  Responding MD:  Dr. Rito Ehrlich  Time MD responded:  0800

## 2013-08-01 LAB — COMPREHENSIVE METABOLIC PANEL
ALT: 12 U/L (ref 0–53)
AST: 13 U/L (ref 0–37)
Albumin: 2.6 g/dL — ABNORMAL LOW (ref 3.5–5.2)
Calcium: 8.8 mg/dL (ref 8.4–10.5)
Creatinine, Ser: 0.71 mg/dL (ref 0.50–1.35)
GFR calc non Af Amer: >90 mL/min (ref >90)
Sodium: 135 mEq/L (ref 135–145)
Total Protein: 5.7 g/dL — ABNORMAL LOW (ref 6.0–8.3)

## 2013-08-01 LAB — GLUCOSE, CAPILLARY
Glucose-Capillary: 100 mg/dL — ABNORMAL HIGH (ref 70–99)
Glucose-Capillary: 107 mg/dL — ABNORMAL HIGH (ref 70–99)
Glucose-Capillary: 116 mg/dL — ABNORMAL HIGH (ref 70–99)

## 2013-08-01 LAB — MAGNESIUM: Magnesium: 2.2 mg/dL (ref 1.5–2.5)

## 2013-08-01 LAB — PHOSPHORUS: Phosphorus: 3.2 mg/dL (ref 2.3–4.6)

## 2013-08-01 NOTE — Progress Notes (Signed)
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 TRUS volume 193gm with large median lobe. Path [id number] with multifocal positive margins including bladder neck, seminal vesicals. Course complicated by urine leak that resolved with prolonged vented suction drainage as confirmed by cystogram.    2 - Small Bowel Obstruction - Pt with new small bowel obstruction by CT 12/17 durnig admission for n/v. No abscess, recurrent leak, bowel perforation. He did not have any direct bowel surgery as part of his prior procedure. Current JP drain felt to be possible nidus and therefore removed as no longer necessary. General surgery consult 12/19 and reccomends conservative measures with continued bowel rest + TPN. KUB 12/22 improved with contrast to distal colon and then pt began having large formed BM later 12/22 and now tolerating diet off NG with daily formed BM's.   Today Michail is feeling stronger. He remains without nasuea and tolerating his full Lq diet.  Objective: Vital signs in last 24 hours: Temp:  [98 F (36.7 C)-99.1 F (37.3 C)] 98.2 F (36.8 C) (12/25 0500) Pulse Rate:  [68-72] 72 (12/25 0500) Resp:  [16-20] 20 (12/25 0500) BP: (115-128)/(67-74) 128/69 mmHg (12/25 0500) SpO2:  [100 %] 100 % (12/25 0500) Last BM Date: 08/01/13  Intake/Output from previous day: 12/24 0701 - 12/25 0700 In: 3451 [I.V.:626; IV Piggyback:1100; TPN:1725] Out: 1675 [Urine:1675] Intake/Output this shift: Total I/O In: -  Out: 200 [Urine:200]  General appearance: alert, cooperative, appears stated age and More vigorous today Head: Normocephalic, without obvious abnormality, atraumatic Eyes: conjunctivae/corneas clear. PERRL, EOM's intact. Fundi benign. Ears: normal TM's and external ear canals both ears Nose: Nares normal. Septum midline. Mucosa normal. No  drainage or sinus tenderness. Throat: lips, mucosa, and tongue normal; teeth and gums normal Neck: no adenopathy, no carotid bruit, no JVD, supple, symmetrical, trachea midline and thyroid not enlarged, symmetric, no tenderness/mass/nodules Back: symmetric, no curvature. ROM normal. No CVA tenderness. Resp: clear to auscultation bilaterally Chest wall: no tenderness Cardio: regular rate and rhythm, S1, S2 normal, no murmur, click, rub or gallop GI: soft, non-tender; bowel sounds normal; no masses,  no organomegaly and decreased distention. No non-tympanic. Increased BS. Male genitalia: normal Extremities: extremities normal, atraumatic, no cyanosis or edema Pulses: 2+ and symmetric Skin: Skin color, texture, turgor normal. No rashes or lesions Lymph nodes: Cervical, supraclavicular, and axillary nodes normal. Neurologic: Grossly normal Incision/Wound: recent port sites c/d/i. No hernias.  Lab Results:  No results found for this basename: WBC, HGB, HCT, PLT,  in the last 72 hours BMET  Recent Labs  07/31/13 0615 08/01/13 0420  NA 134* 135  K 2.7* 3.8  CL 105 103  CO2 24 25  GLUCOSE 103* 104*  BUN 12 11  CREATININE 0.62 0.71  CALCIUM 7.4* 8.8   PT/INR No results found for this basename: LABPROT, INR,  in the last 72 hours ABG No results found for this basename: PHART, PCO2, PO2, HCO3,  in the last 72 hours  Studies/Results: No results found.  Anti-infectives: Anti-infectives   Start     Dose/Rate Route Frequency Ordered Stop   07/28/13 2200  vancomycin (VANCOCIN) 1,250 mg in sodium chloride 0.9 % 250 mL IVPB     1,250 mg 166.7 mL/hr over 90 Minutes Intravenous Every 12 hours 07/28/13 0918     07/28/13 1000  vancomycin (VANCOCIN) 500 mg in sodium chloride 0.9 % 100 mL  IVPB     500 mg 100 mL/hr over 60 Minutes Intravenous  Once 07/28/13 0917 07/28/13 1058   07/27/13 0800  vancomycin (VANCOCIN) IVPB 750 mg/150 ml premix  Status:  Discontinued     750 mg 150 mL/hr over  60 Minutes Intravenous Every 12 hours 07/26/13 2003 07/28/13 0918   07/26/13 2100  vancomycin (VANCOCIN) IVPB 750 mg/150 ml premix     750 mg 150 mL/hr over 60 Minutes Intravenous  Once 07/26/13 2002 07/26/13 2148   07/24/13 0900  cefTRIAXone (ROCEPHIN) 1 g in dextrose 5 % 50 mL IVPB  Status:  Discontinued     1 g 100 mL/hr over 30 Minutes Intravenous Every 24 hours 07/24/13 0746 07/26/13 1932      Assessment/Plan:   1 - 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 No further intervention while in house.    2 - Small Bowel Obstruction - Making great progress clinically. Agree with TPN wean, adv diet briskly.  No contraindications for DC soon from GU perspective.   [facility], Kierstyn Baranowski 08/01/2013

## 2013-08-01 NOTE — Progress Notes (Signed)
PARENTERAL NUTRITION CONSULT NOTE - Follow Up  Pharmacy Consult for TNA  Indication: SBO  Allergies  Allergen Reactions  . 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 Plasticized Base [Plastibase] Rash  . Penicillins Rash    Patient Measurements: Height: 5\' 7"  (170.2 cm) Weight: 138 lb 3.7 oz (62.7 kg) IBW/kg (Calculated) : 66.1 Usual Weight: 71.4 kg  Vital Signs: Temp: 98.2 F (36.8 C) (12/25 0500) Temp src: Oral (12/25 0500) BP: 128/69 mmHg (12/25 0500) Pulse Rate: 72 (12/25 0500) Intake/Output from previous day: 12/24 0701 - 12/25 0700 In: 3451 [I.V.:626; IV Piggyback:1100; TPN:1725] Out: 1675 [Urine:1675]  Labs: No results found for this basename: WBC, HGB, HCT, PLT, APTT, INR,  in the last 72 hours   Recent Labs  07/31/13 0615 08/01/13 0420  NA 134* 135  K 2.7* 3.8  CL 105 103  CO2 24 25  GLUCOSE 103* 104*  BUN 12 11  CREATININE 0.62 0.71  CALCIUM 7.4* 8.8  MG  --  2.2  PHOS  --  3.2  PROT  --  5.7*  ALBUMIN  --  2.6*  AST  --  13  ALT  --  12  ALKPHOS  --  67  BILITOT  --  0.3   Estimated Creatinine Clearance: 75.1 ml/min (by C-G formula based on Cr of 0.71).    Recent Labs  08/01/13 0026 08/01/13 0404 08/01/13 0748  GLUCAP 116* 100* 107*    Nutritional Goals:   RD recs 12/21: 1700-1900 Kcal, 75-85 g protein and 1.7-1.9 L fluid / day.  Clinimix 5/20 at a goal rate of 65 ml/hr + 20% fat emulsion at 10 ml/hr to provide: 78 g/day protein, 1853 Kcal/day.  Current nutrition:   Diet: Full liquids (started 12/25)  IVF: 1/2 NS + KCl/L @ 40 ml/hr  Clinimix 5/20 + Lipids at goal rate  CBGs & Insulin requirements past 24 hours:   CBG range: 91-116  0 units of sensitive SSI  Assessment:  13 yoM with history of prostate cancer, small bowel obstruction, recent prolonged hospital stay and discharged on 07/09/13, s/p robotic radical prostatectomy with bilateral pelvic lymphadenectomy 06/21/13, admitted 07/23/13  progressively worsening abdominal distention and coffee ground emesis that initially started 3 days prior to this admission.  CT scan revealed bowel obstruction.  Per surgery, patient to need 7-10 more days of bowel rest prior to attempting further surgery.  Starting TNA 12/20.   TPN Access: PICC line placed 12/19  TPN day# 6.  Pt continues on TPN at goal rate without reported complications.  NGT removed 12/24 and advanced to clears.  Advanced to full liquids today.  Per Rn pt is tolerating PO well at this time.  BM x 2 on 12/24 and x1 on 12/25 so far.  Consider weaning TPN today if advancing PO diet.   Labs:  Renal function: SCr 0.71 stable  Hepatic function: LFTs WNL (12/25)  Electrolytes: Na 135, K improved to 3.8, others wnl.    Pre-Albumin:  21.3 (12/20), 16.8 (12/22), pending (12/25)  TG:  86 (12/22)  Glucose: CBGs at goal < 150  Plan:   Wean TNA:  Reduce to 1/2 rate (~ 30 ml/hr) x2 hours, then D/C.   Lynann Beaver PharmD, BCPS Pager [phone] 08/01/2013 10:20 AM

## 2013-08-01 NOTE — Progress Notes (Signed)
Discharge to home, wife at bedside. D/c instructions and follow up appointment done and was given tot he patient. PICC line removed by IV RN no s/s of bleeding noted upon discharged.

## 2013-08-01 NOTE — Discharge Summary (Signed)
Physician Discharge Summary  Keith Bates JXB:[id number] DOB: 09/23/1940 DOA: 07/23/2013  PCP: Park Pope, MD  Admit date: 07/23/2013 Discharge date: 08/01/2013  Time spent: 25 minutes  Recommendations for Outpatient Follow-up:  1. Patient will followup with his primary care physician in the next one month.  2. He'll follow up with urology in the next one month.  Discharge Diagnoses:  Active Problems:   Small bowel obstruction   SBO (small bowel obstruction)   UTI (urinary tract infection)   Hypokalemia   Normocytic anemia Prostate cancer Leukocytosis   Discharge Condition: Improved, being discharged home  Diet recommendation: Regular diet  Filed Weights   07/23/13 2132  Weight: 62.7 kg (138 lb 3.7 oz)    History of present illness:  72 year old African American male past Mr. prostate cancer and small bowel obstruction with a recent prolonged hospital stay discharge on 12/2 status post robotic radical prostatectomy and bilateral pelvic lymphadenectomy in November for prostate cancer presented to [facility] long emergency room on 12/16 for progressively worsening abdominal distention and coffee ground emesis that started 3 days prior. He was found to have a small bowel obstruction by x-ray and was admitted to the hospitalist service. He had nasogastric tube placed in the emergency room.  Hospital Course:  Active Problems:   Small bowel obstruction: Patient had a similar small obstruction last month during his previous hospitalization. CT of abdomen and pelvis noted transition point and location a JP drain a right lower quadrant. JP drain was removed by urology on 12/17 but with persistence of SBO, sternal surgery was consulted. Patient was medically managed with fluids, n.p.o. status. He was placed on peripheral nutrition. By 12/22 patient started to have flatus and having, meds and his diet was slowly advanced once his NG tube was clamped and then removed. He was able to tolerate  solid food by 12/25 initially discharged.    UTI (urinary tract infection): Patient was noted to drop coag negative staph. Repeat urine cultures negative. Possible contaminant, however patient was placed on vancomycin for thoroughness sake especially in setting of other issues. By day of discharge, patient completed 7 day course of IV vancomycin    Hypokalemia: Felt to be likely iatrogenic from GI losses and stable. Treated by TPN.  Leukocytosis: Felt to be secondary UTI and resolved on antibiotics.    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 Stable.  Prostate cancer: Patient will need likely adjuvant therapy in a few months. Follow up with urology as outpatient   Procedures: Removal of his JP drain on 12/17    Consultations:  [facility] surgery  Urology  Discharge Exam: Filed Vitals:   08/01/13 0500  BP: 128/69  Pulse: 72  Temp: 98.2 F (36.8 C)  Resp: 20    General: Alert and oriented x3, no acute distress Cardiovascular: Regular rate and rhythm, S1-S2 Respiratory: Clear to auscultation bilaterally  Discharge Instructions  Discharge Orders   Future Orders Complete By Expires   Diet general  As directed    Increase activity slowly  As directed        Medication List    STOP taking these medications       ciprofloxacin 500 MG tablet  Commonly known as:  CIPRO     sulfamethoxazole-trimethoprim 800-160 MG per tablet  Commonly known as:  BACTRIM DS      TAKE these medications       fluticasone 50 MCG/ACT nasal spray  Commonly known as:  FLONASE  Place 2  sprays into the nose daily.     HYDROcodone-acetaminophen 5-325 MG per tablet  Commonly known as:  NORCO  Take 1-2 tablets by mouth every 6 (six) hours as needed.     loratadine 10 MG tablet  Commonly known as:  CLARITIN  Take 10 mg by mouth daily.     omeprazole 20 MG capsule  Commonly known as:  PRILOSEC  Take 20 mg by mouth daily.       Allergies  Allergen Reactions   . 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 Plasticized Base [Plastibase] Rash  . Penicillins Rash       Follow-up Information   Follow up with Park Pope, MD In 1 month.   Specialty:  Family Medicine   Contact information:   [address]   [place] [address] [phone]        The results of significant diagnostics from this hospitalization (including imaging, microbiology, ancillary and laboratory) are listed below for reference.    Significant Diagnostic Studies: Ct Abdomen Pelvis W Contrast  07/24/2013   CLINICAL DATA:  Small bowel obstruction.  Prostate cancer.  EXAM: CT ABDOMEN AND PELVIS WITH CONTRAST  TECHNIQUE: Multidetector CT imaging of the abdomen and pelvis was performed using the standard protocol following bolus administration of intravenous contrast.  CONTRAST:  OMNIPAQUE IOHEXOL 300 MG/ML  SOLN  COMPARISON:  CT scan dated 07/01/2013  FINDINGS: Slight atelectasis at the lung bases has almost completely resolved.  There is a small bowel obstruction in the right lower quadrant. The small bowel is obstructed immediately anterior to the draining which is crossing under the point of obstruction. This is best seen on image number 49 of series 603.  The liver, biliary tree, spleen, pancreas, adrenal glands, and kidneys are normal.  The colon is not distended.  Catheter has been removed from the bladder. Bladder wall is somewhat thickened. There is no residual free fluid in the abdomen or pelvis.  IMPRESSION: 1. Small bowel obstruction immediately adjacent to the drainage tube in the right lower quadrant. 2. There is no residual free fluid in the abdomen.   Electronically Signed   By: Geanie Cooley M.D.   On: 07/24/2013 13:23   Dg Chest Port 1 View  07/28/2013   CLINICAL DATA:  PICC line placement.  EXAM: PORTABLE CHEST - 1 VIEW  COMPARISON:  07/23/2013  FINDINGS: Right PICC line is in place with the tip at the cavoatrial junction. NG tube is in the  proximal stomach. Heart is normal size. Lungs are clear. No effusions. Degenerative changes in the shoulders. No acute bony abnormality.  IMPRESSION: Right PICC line tip at the cavoatrial junction.  No active disease.   Electronically Signed   By: Charlett Nose M.D.   On: 07/28/2013 07:59   Dg Abd 2 Views  07/29/2013   CLINICAL DATA:  Bowel obstruction.  EXAM: ABDOMEN - 2 VIEW  COMPARISON:  July 27, 2013.  FINDINGS: There is a moderate amount of contrast within normal calibered colon and rectum. There are several loops of mildly distended gas-filled small bowel in the mid abdomen. There is small amount of gas in the stomach. The esophagogastric tube tip in proximal port lie in the region of the gastric cardia. No free extraluminal gas collections are demonstrated.  IMPRESSION: The findings are consistent with a partial distal small bowel obstruction. The volume of fluid within small bowel loops appears to have decreased somewhat while gaseous  distention has become more conspicuous.   Electronically Signed   By: David  [place]   On: 07/29/2013 07:38   Dg Abd 2 Views  07/27/2013  IMPRESSION: Persistent small bowel obstruction bowel gas pattern. The presence of contrast within the right mid abdomen, likely within the cecum suggests partial obstruction.   Electronically Signed   By: Andreas Newport M.D.   On: 07/27/2013 08:46   Dg Abd 2 Views  07/26/2013    IMPRESSION: The degree of small bowel distension has minimally changed but there is now more fluid within the small bowel loops. Findings remain compatible with a bowel obstruction.  Nasogastric tube tip in the distal esophagus. Nasogastric tube tip location was discussed with the patient's nurse, Amil Amen, on 07/26/2013 at 3:58 p.m.   Electronically Signed   By: Richarda Overlie M.D.   On: 07/26/2013 15:59   Dg Abd Acute W/chest  07/23/2013     IMPRESSION: Findings consistent with small bowel obstruction. No free air. No edema or consolidation.    Electronically Signed   By: Bretta Bang M.D.   On: 07/23/2013 13:56    Microbiology: Recent Results (from the past 240 hour(s))  URINE CULTURE     Status: None   Collection Time    07/24/13  6:22 AM      Result Value Range Status   Specimen Description URINE, CLEAN CATCH   Final   Special Requests NONE   Final   Culture  Setup Time     Final   Value: 07/24/2013 12:52     Performed at [facility] Count     Final   Value: >=100,000 COLONIES/ML     Performed at [facility]   Culture     Final   Value: STAPHYLOCOCCUS SPECIES (COAGULASE NEGATIVE)     Note: RIFAMPIN AND GENTAMICIN SHOULD NOT BE USED AS SINGLE DRUGS FOR TREATMENT OF STAPH INFECTIONS.     Performed at [facility]   Report Status 07/26/2013 FINAL   Final   Organism ID, Bacteria STAPHYLOCOCCUS SPECIES (COAGULASE NEGATIVE)   Final  URINE CULTURE     Status: None   Collection Time    07/27/13 12:39 AM      Result Value Range Status   Specimen Description URINE, RANDOM   Final   Special Requests NONE   Final   Culture  Setup Time     Final   Value: 07/27/2013 18:55     Performed at [facility] Count     Final   Value: NO GROWTH     Performed at [facility]   Culture     Final   Value: NO GROWTH     Performed at [facility]   Report Status 07/28/2013 FINAL   Final     Labs: Basic Metabolic Panel:  Recent Labs Lab 07/27/13 0455 07/28/13 0500 07/29/13 0405 07/31/13 0615 08/01/13 0420  NA 132* 134* 133* 134* 135  K 4.0 3.6 3.1* 2.7* 3.8  CL 94* 99 96 105 103  CO2 28 28 29 24 25   GLUCOSE 113* 105* 127* 103* 104*  BUN 15 15 13 12 11   CREATININE 0.99 0.86 0.76 0.62 0.71  CALCIUM 9.5 8.7 8.6 7.4* 8.8  MG 2.2 2.1 2.1  --  2.2  PHOS 3.0 2.9 2.9  --  3.2   Liver Function Tests:  Recent Labs Lab 07/27/13 0455 07/29/13 0405 08/01/13 0420  AST 15  11 13  ALT 14 8 12   ALKPHOS 94 68 67  BILITOT 0.8 0.3 0.3  PROT 6.9 5.6* 5.7*   ALBUMIN 3.2* 2.6* 2.6*   No results found for this basename: LIPASE, AMYLASE,  in the last 168 hours No results found for this basename: AMMONIA,  in the last 168 hours CBC:  Recent Labs Lab 07/26/13 0540 07/27/13 0455 07/28/13 0500 07/29/13 0405  WBC 7.9 10.6* 9.1 8.0  NEUTROABS  --  7.6  --  5.7  HGB 13.7 13.4 12.3* 11.5*  HCT 40.8 40.2 36.0* 34.0*  MCV 90.7 91.8 90.0 90.7  PLT 312 316 244 232   Cardiac Enzymes: No results found for this basename: CKTOTAL, CKMB, CKMBINDEX, TROPONINI,  in the last 168 hours BNP: BNP (last 3 results) No results found for this basename: PROBNP,  in the last 8760 hours CBG:  Recent Labs Lab 07/31/13 2043 08/01/13 0026 08/01/13 0404 08/01/13 0748 08/01/13 1232  GLUCAP 111* 116* 100* 107* 93       Signed:  Jaquasha Carnevale K  [facility] 08/01/2013, 1:50 PM

## 2013-08-01 NOTE — Progress Notes (Signed)
Patient ID: Keith Bates, male   DOB: [birth year], 72 y.o.   MRN: [id number]    Subjective: Walking the halls. Tolerating a full liquid diet and having bowel movements. No palpable pain or distention or bloating.  Objective: Vital signs in last 24 hours: Temp:  [98 F (36.7 C)-99.1 F (37.3 C)] 98.2 F (36.8 C) (12/25 0500) Pulse Rate:  [68-72] 72 (12/25 0500) Resp:  [16-20] 20 (12/25 0500) BP: (115-128)/(67-74) 128/69 mmHg (12/25 0500) SpO2:  [100 %] 100 % (12/25 0500) Last BM Date: 08/01/13  Intake/Output from previous day: 12/24 0701 - 12/25 0700 In: 3451 [I.V.:626; IV Piggyback:1100; TPN:1725] Out: 1675 [Urine:1675] Intake/Output this shift: Total I/O In: -  Out: 200 [Urine:200]  General appearance: alert, cooperative and no distress GI: non distended  Lab Results:  No results found for this basename: WBC, HGB, HCT, PLT,  in the last 72 hours BMET  Recent Labs  07/31/13 0615 08/01/13 0420  NA 134* 135  K 2.7* 3.8  CL 105 103  CO2 24 25  GLUCOSE 103* 104*  BUN 12 11  CREATININE 0.62 0.71  CALCIUM 7.4* 8.8     Studies/Results: No results found.  Anti-infectives: Anti-infectives   Start     Dose/Rate Route Frequency Ordered Stop   07/28/13 2200  vancomycin (VANCOCIN) 1,250 mg in sodium chloride 0.9 % 250 mL IVPB     1,250 mg 166.7 mL/hr over 90 Minutes Intravenous Every 12 hours 07/28/13 0918     07/28/13 1000  vancomycin (VANCOCIN) 500 mg in sodium chloride 0.9 % 100 mL IVPB     500 mg 100 mL/hr over 60 Minutes Intravenous  Once 07/28/13 0917 07/28/13 1058   07/27/13 0800  vancomycin (VANCOCIN) IVPB 750 mg/150 ml premix  Status:  Discontinued     750 mg 150 mL/hr over 60 Minutes Intravenous Every 12 hours 07/26/13 2003 07/28/13 0918   07/26/13 2100  vancomycin (VANCOCIN) IVPB 750 mg/150 ml premix     750 mg 150 mL/hr over 60 Minutes Intravenous  Once 07/26/13 2002 07/26/13 2148   07/24/13 0900  cefTRIAXone (ROCEPHIN) 1 g in dextrose 5 % 50 mL IVPB   Status:  Discontinued     1 g 100 mL/hr over 30 Minutes Intravenous Every 24 hours 07/24/13 0746 07/26/13 1932      Assessment/Plan: Indwelling, small bowel obstruction clinically resolved. Diet being advanced. Anticipated discharge tomorrow. We will sign off. Please call if needed.     LOS: 9 days    Creedon Danielski T 08/01/2013

## 2013-08-02 NOTE — Progress Notes (Signed)
Discharge summary sent to payer through [facility]  

## 2013-09-04 NOTE — Op Note (Signed)
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   PROCEDURES:  1. Robotic-assisted laparoscopic prostatectomy.  2. Bilateral pelvic lymphadenectomy.   ASSISTANT: Leta Baptist, PA.   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   ESTIMATED BLOOD LOSS: Less than 100 mL.   ASSESSMENT:  1. Right obturator lymph nodes.  2. Right external iliac lymph nodes.  3. Left obturator lymph nodes.  4. Left external iliac lymph nodes.  5. Radical prostatectomy.  6. Periprostatic fat. All for permanent pathology.   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 He has undergone staging imaging  including CT and bone scan, which revealed no distant disease. He  notably has a very large prostate that is approximately 200 g with a  very large median lobe and he does have some significant baseline  obstructive symptoms. Options were discussed with the patient in  detail. He is also evaluated at the [facility] multidisciplinary  prostate cancer conference and he was wished to proceed with radical  prostatectomy. Informed consent was obtained and placed in medical  record.   PROCEDURE IN DETAIL: The patient being, Keith Bates, verified.  Procedure being radial prostatectomy was confirmed, procedure was  carried out. Time-out was performed. Intravenous antibiotics were  administered. General endotracheal anesthesia was introduced. The  patient placed into a low-lithotomy position. His penis, perineum,  proximal thighs were prepped with iodine, and then  xiphoid, abdomen was  prepped with chlorhexidine gluconate. He was further fashioned on the  operating table using 3-inch tape over his chest that was padded.  Testis tissue in position was performed. He was found to be adequately  positioned. Foley catheter was placed through a straight drain. Next,  a high-flow low-pressure pneumoperitoneum was obtained using Veress  technique in the supraumbilical midline having passed the aspiration and  drop test. A 12-mm robotic camera port was placed in this location and  laparoscopic examination of peritoneal cavity revealed no significant  adhesions and no visceral injury. Additional ports were placed as  follows, right far lateral 12 mm assist port, right paramedian 8 mm  robotic port, left paramedian 10 mm robotic port, left far lateral 8 mm  robotic port, right paramedian 5 mm cyst port. Robot was docked and  passed through electronic checks. Initial attention was directed at  development of retroperitoneum. Incision was made lateral to the left  medial umbilical ligament from the midline towards the area of the  internal ring and following the course of the iliac vessels. The  bladder was swept away from the sidewall. Mirror-image dissection was  performed on the right side. Anterior attachments were then taken down  to the area at the base of the prostate. The endopelvic fascia was then  identified and subway and then the lateral border of the prostate and  base to apex orientation. This exposed the dorsal venous complex, which  was controlled with endovascular load stapler. Next, the bladder neck  was identified in the Foley catheter back and forth. Dissection  proceeded at this level in the anterior-posterior direction, thus  separating the bladder neck from the  base of the prostate. The mid to  posterior aspect of this, the massive median lobe was encountered and  carefully delivered through the incision. A figure-of-eight Vicryl  suture  was applied to this to allow manipulation, this placed in  superior traction. Ureteral orifices were identified at this point, and  posterior dissection was performed, thus separating the bladder neck  completely from the base of the prostate, taking great care to ensure  that no direct injury occurred. Dissection proceeded in this plane inferiorly and the bilateral seminal  vesicles were encountered, dissected for distance of 4 cm placed on  gentle superior lateral traction and the bilateral seminal vesicles.  Also, encountered dissection to the tips. The space of [place] was  then further developed from the base to apex of the prostate. This  sweeping the perirectal fat posteriorly. This exposed the prostatic  pedicles bilaterally first on the right side using sequential clipping  technique. The right prostatic pedicle was taken out in a base to apex  orientation and mirror-image dissection was performed on the left side.  Next, apical dissection was performed by keeping the prostate in gentle  superior traction, identifying the membranous urethra and transecting  leaving what appeared to be an adequate membranous urethral stump. This  completely freed up the radical prostatectomy specimen. This specimen  appeared to be much too large for placement in a standard retreival bag and was  therefore set aside at this time. Attention was directed pelvic  lymphadenectomy first in the right side. All fibrofatty tissue in the  confines of the external iliac vein, artery, and genitofemoral nerve  carefully dissected. Hemostasis was achieved with cold clips. This set  aside labeled right external iliac lymph nodes. All fibrofatty tissue  in the confines of the right internal iliac artery, obturator nerve, and  pelvic side wall were then dissected, labeled right external iliac lymph  nodes and internal iliac lymph nodes and set aside for permanent  pathology. A mirror-image lymphadenectomy  performed on the left side,  dissecting again the left external iliac as well as the left obturator  and internal iliac lymph nodes. All set aside for permanent pathology  separately and lymphostasis achieved with cold clips. Following these  maneuvers, the obturator nerve were visualized and found to be  uninjured. Next, bladder neck reconstruction was performed using figure-of-  eight 2-0 Vicryl at the 3 o'clock and 9 o'clock position of the bladder.  Thus, making the caliber more suitable for anastomosis,this apparently  also folded ureteral orifices away from the area of anastomosis further.  Next, posterior reconstruction was performed by using a single V-Loc suture  between the posterior membranous urethral plate and posterior bladder neck fascia  reapproximating and tension-free anastomosis. The mucosa anastomosis  was then performed with double-armed V-Loc suture in a posterior and  anterior direction, resulted in excellent mucosal apposition. Final  Foley catheter was placed, inflated, irrigated, and found to be no  evidence of gross leak. Notably just prior to the anastomosis, digital rectal  exam was performed and there was no evidence of rectal perforation. A  closed suction drain was brought through the left lateral most robotic  port site to the area of the pelvis. The right 12 mm port site was  closed with fascia using [place] suture passer. After this,  port site was used to deliver the extra large retrieval bag towards the  large prostate. Specimen was retrieved by extending camera port site  for a distance of  approximately 6 cm and fascia was reapproximated using  absorbable suture as a Scarpa's in skin. All  incision sites were infiltrated with dilute Marcaine and the procedure  was then terminated. The patient tolerated the procedure well. There  were no immediate periprocedural complications. The patient was taken  to postanesthesia care unit in stable  condition.  ______________________________  Alexis Frock, MD

## 2014-01-06 IMAGING — CT CT ABD-PELV W/ CM
3 of 5 series · 16 of 46 positions shown, 18 images · IV contrast (OMNIPAQUE)
Comparison: CT cystogram dated 06/26/2013

CLINICAL DATA: Status post robotic assisted laparoscopic radical
prostatectomy on 06/21/2013. Known bladder leak, evaluate for
ureteral leak/injury.

EXAM:
CT ABDOMEN AND PELVIS WITH CONTRAST
TECHNIQUE: Multidetector CT imaging of the abdomen and pelvis was performed
using the standard protocol following bolus administration of
intravenous contrast.
CONTRAST:  100mL OMNIPAQUE IOHEXOL 300 MG/ML  SOLN

[Series 2: rtn a/p with · axial · 0.77mm/px · z∈[-390,-20]mm · 12 of 88 slices shown, 14 images]
[im 7/88  soft-tissue]
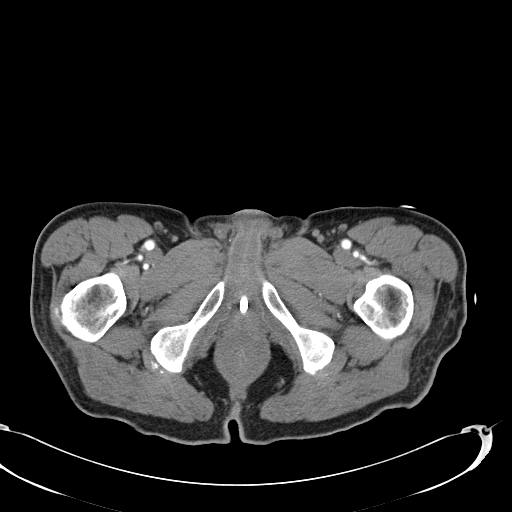
[im 7/88  bone]
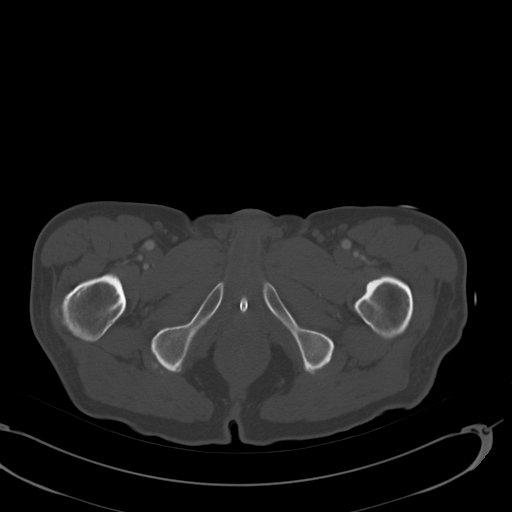
[im 14/88  soft-tissue]
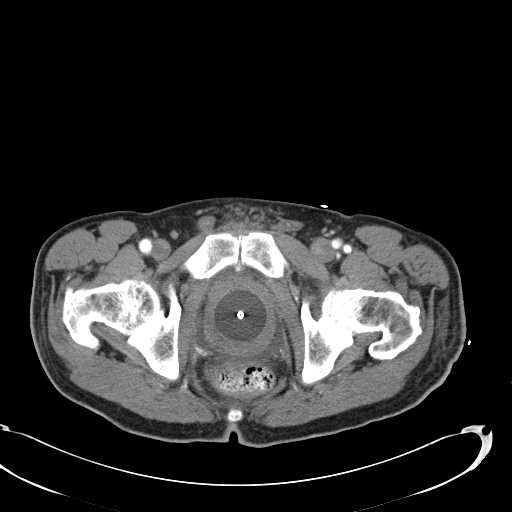
[im 21/88  soft-tissue]
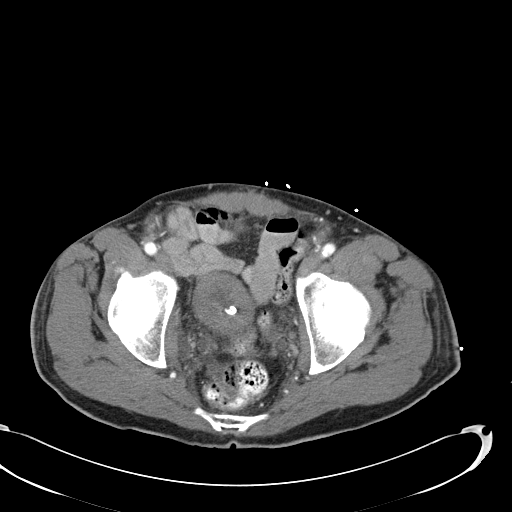
[im 27/88  soft-tissue]
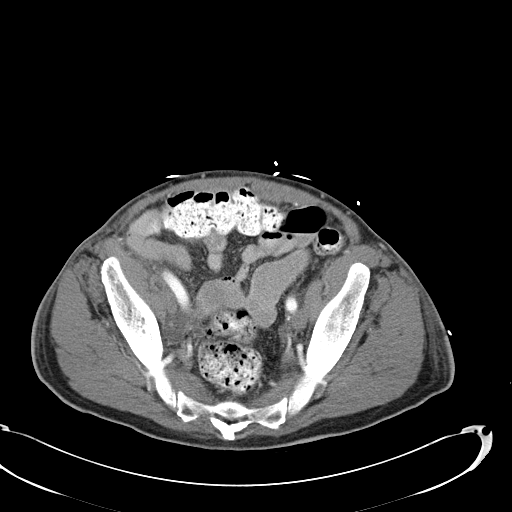
[im 34/88  soft-tissue]
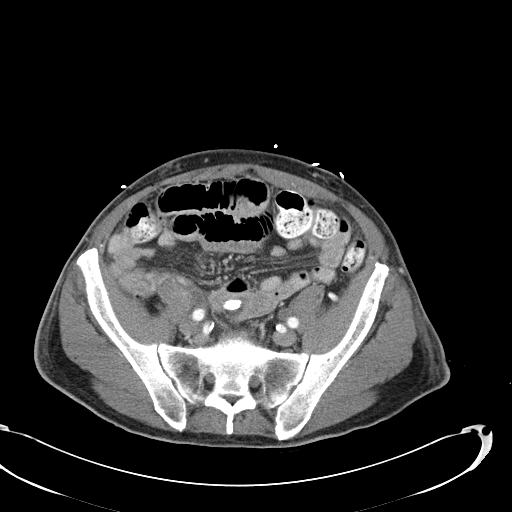
[im 41/88  soft-tissue]
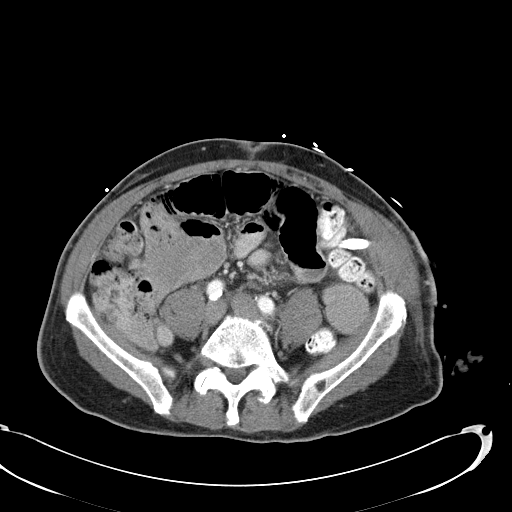
[im 47/88  soft-tissue]
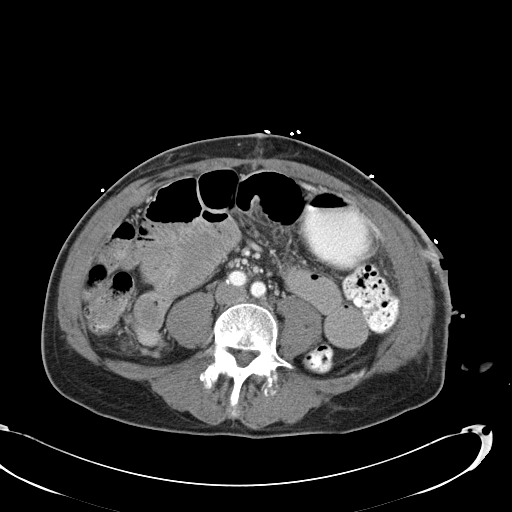
[im 54/88  soft-tissue]
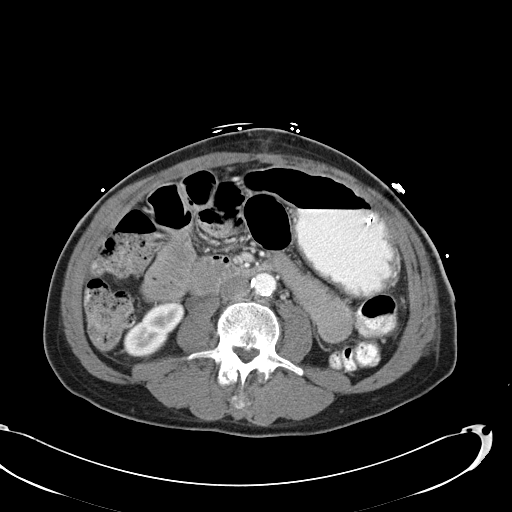
[im 61/88  soft-tissue]
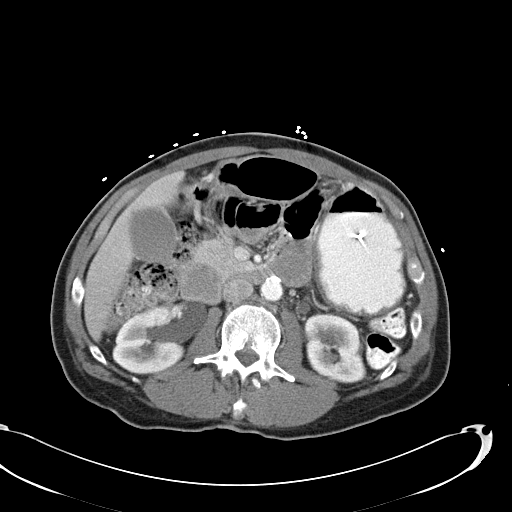
[im 61/88  bone]
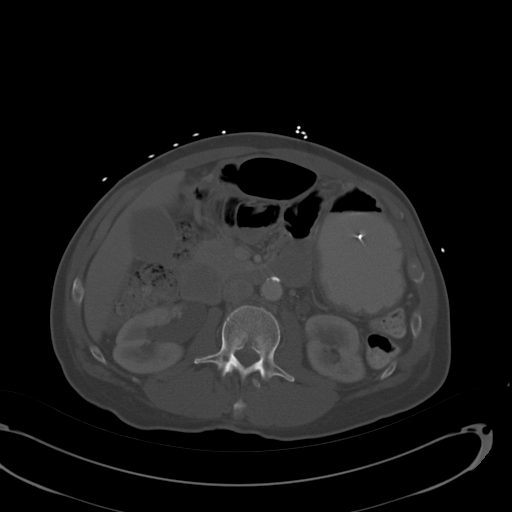
[im 67/88  soft-tissue]
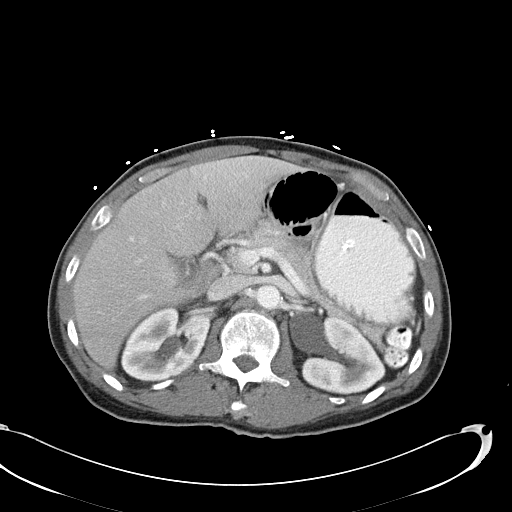
[im 74/88  soft-tissue]
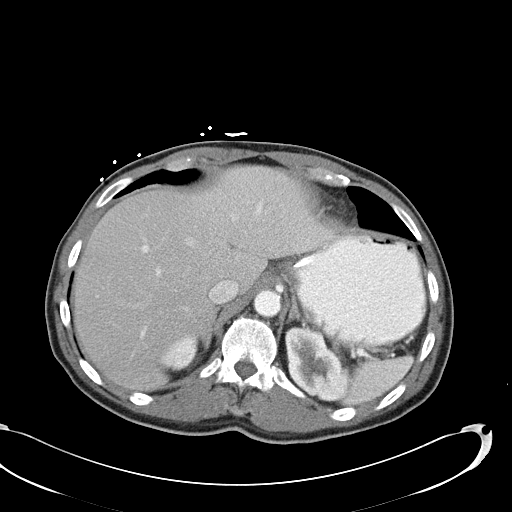
[im 81/88  soft-tissue]
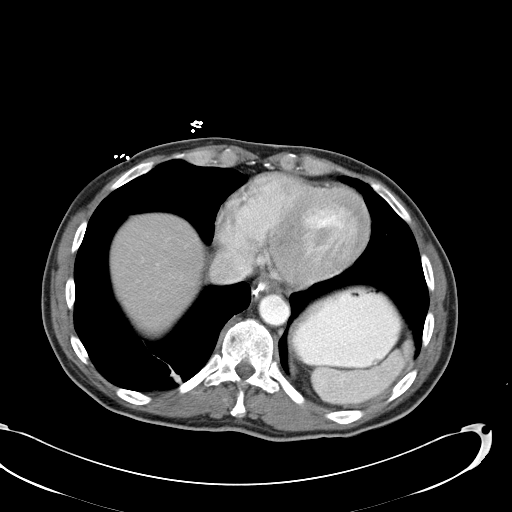

[Series 4: lung windows · axial · 0.77mm/px · 1 of 23 slices shown]
[im 8/23  bone]
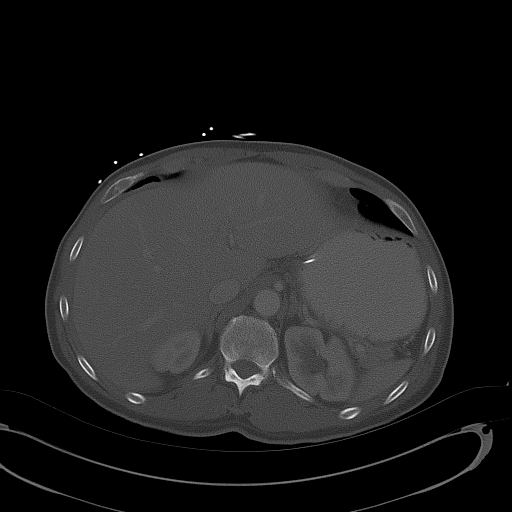

[Series 602: <mpr thick range> · coronal · 0.85mm/px · 3 of 84 slices shown]
[im 28/84  soft-tissue]
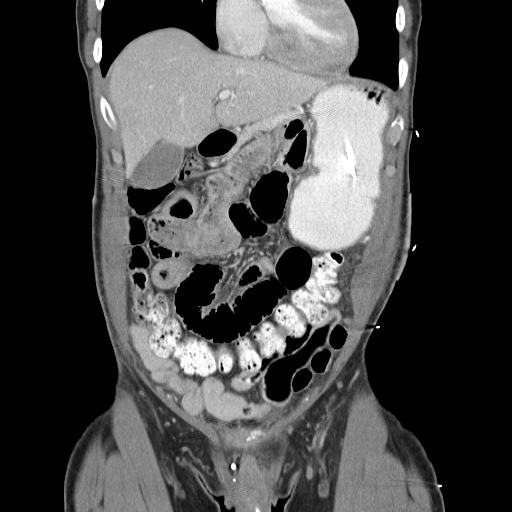
[im 37/84  soft-tissue]
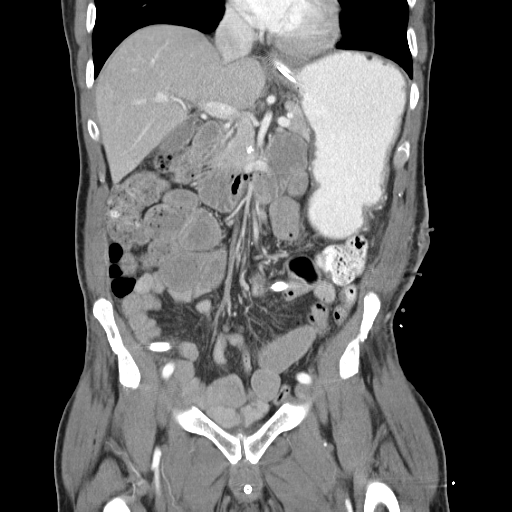
[im 47/84  soft-tissue]
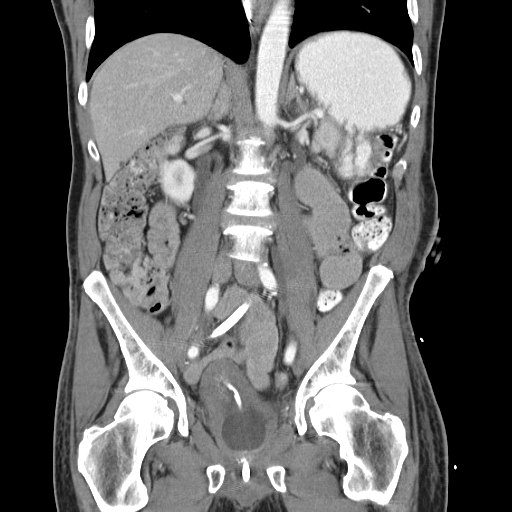

[16 of 46 positions shown; findings below may reference images not displayed]

FINDINGS: Mild scarring with atelectasis at the lung bases.

Enteric tube terminates in the gastric body.

Liver, spleen, pancreas, and adrenal glands are within normal
limits.

Gallbladder is unremarkable. No intrahepatic or extrahepatic ductal
dilatation.

Kidneys are within normal limits. Mild bilateral
hydroureteronephrosis.

No evidence of bowel obstruction.  Normal appendix.

Atherosclerotic calcifications of the abdominal aorta and branch
vessels.

Thick-walled bladder with indwelling Foley catheter. Multiple
layering bladder calculi.

Small volume pelvic ascites (series 5/ image 69), corresponding to
known bladder leak, improved. At least one layering calculus is
present within the fluid collection (series 2/image 92).

Indwelling right lower quadrant surgical drain.

Delayed imaging through the abdomen/pelvis was performed at 5
minutes, and no leak of extraluminal contrast is visualized.
However, the bilateral ureters remain dilated but non-opacified at
that time.

Degenerative changes of the visualized thoracolumbar spine. Grade 1
anterolisthesis of L4 on L5.
IMPRESSION: No leak of extraluminal contrast is visualized on 5 minute delayed
imaging. However, the bilateral ureters remain unopacified at that
time.

Mild bilateral hydroureteronephrosis.

Thick-walled bladder with indwelling Foley catheter and multiple
layering bladder calculi.

Small volume pelvic ascites, corresponding to known bladder leak,
improved.

Indwelling right lower quadrant surgical drain.

## 2014-01-28 IMAGING — CR DG ABDOMEN ACUTE W/ 1V CHEST
3 series · 3 of 3 positions shown · non-contrast
Comparison: Chest radiograph June 27, 2013; abdomen July 18, 2013

CLINICAL DATA: Nausea and emesis

EXAM:
ACUTE ABDOMEN SERIES (ABDOMEN 2 VIEW & CHEST 1 VIEW)

[w chest pa]
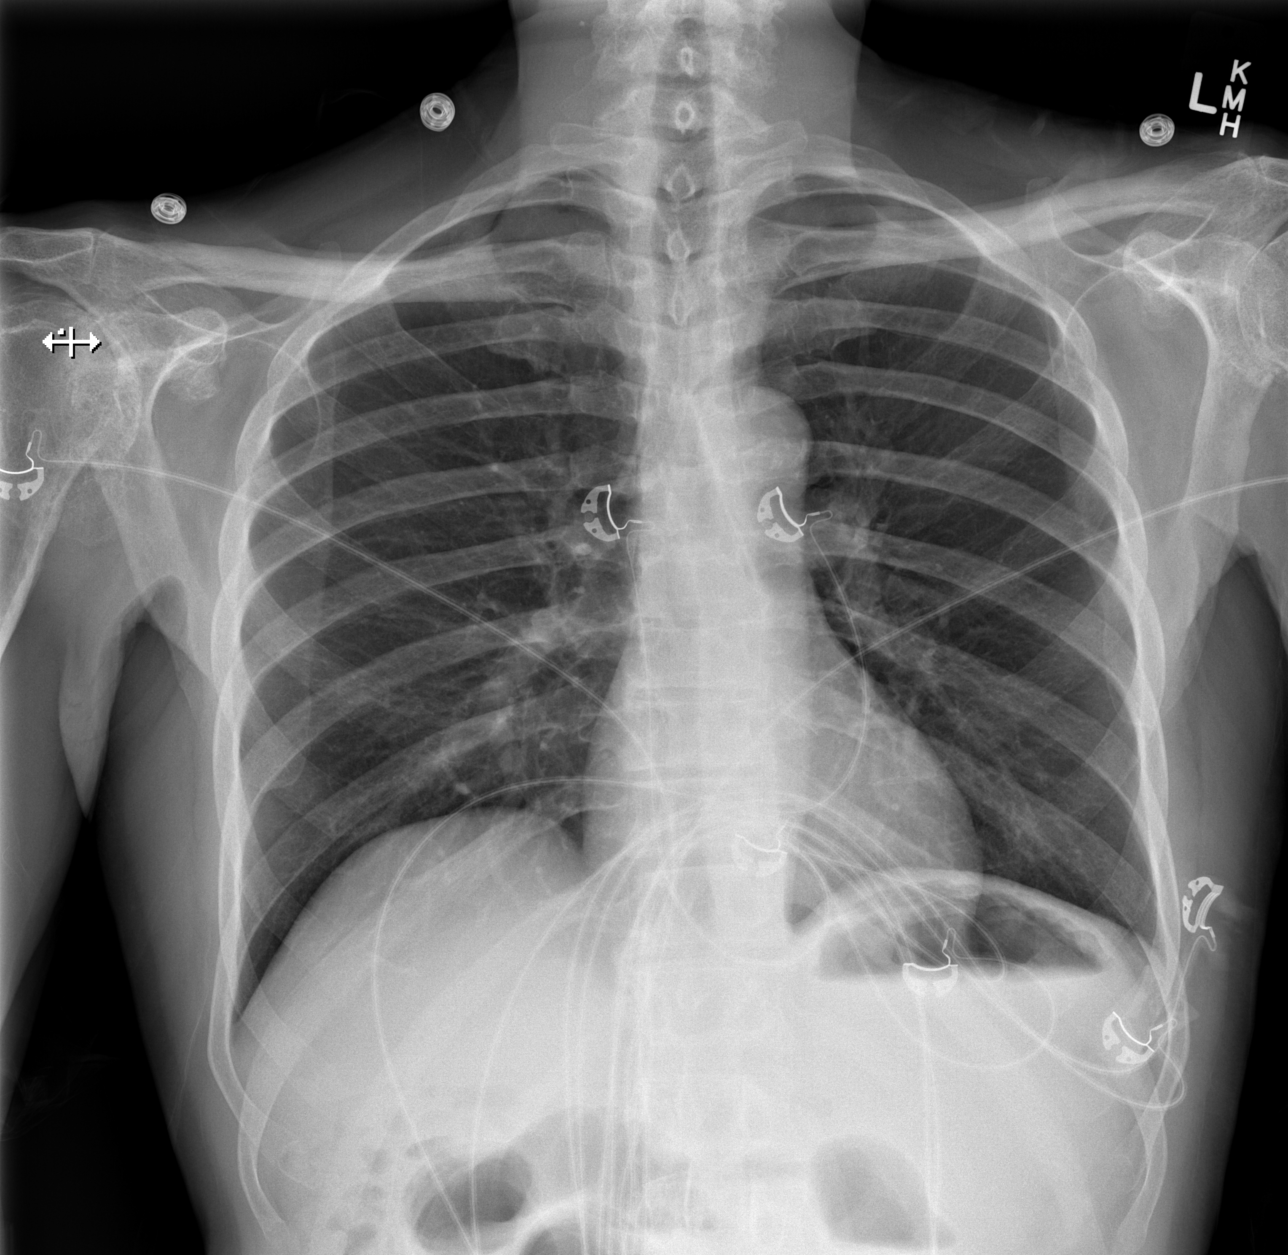

[w abdomen upright]
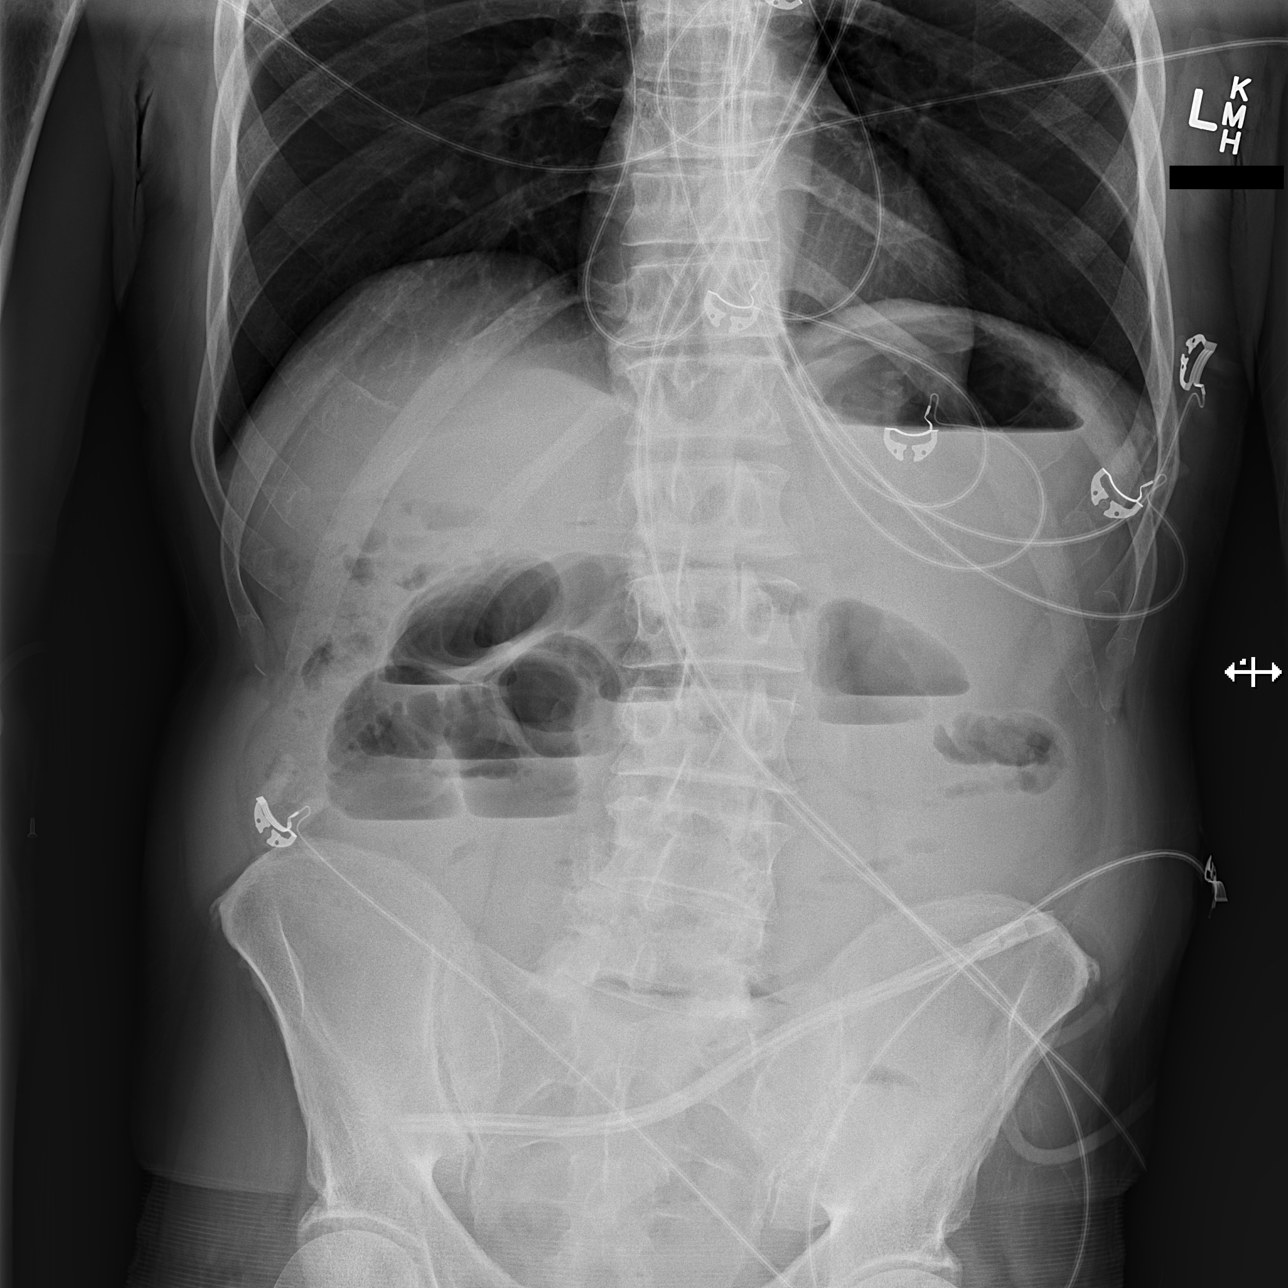

[t abdomen supine]
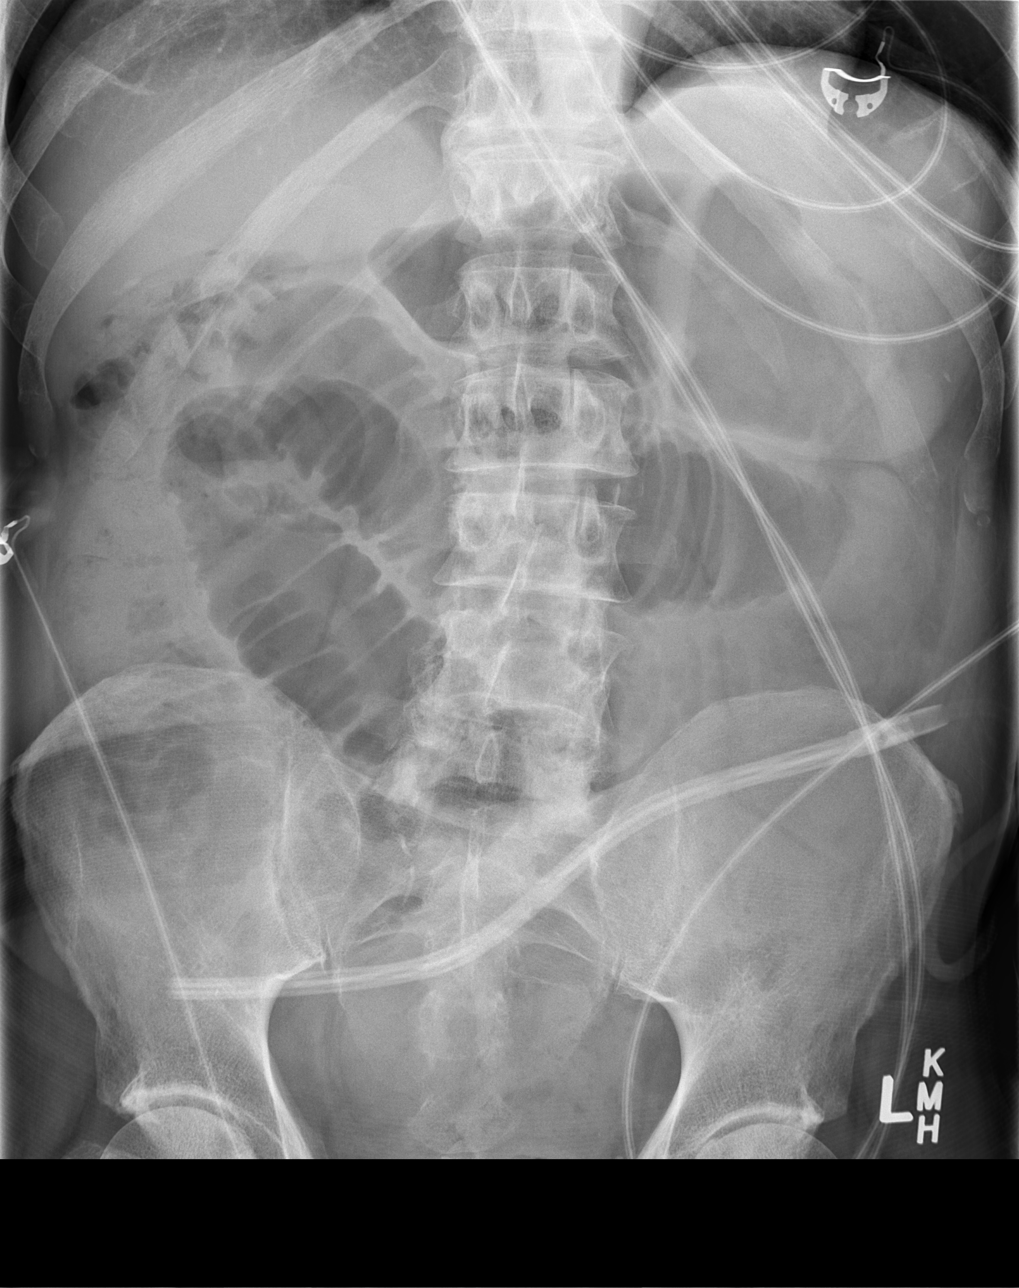

[3 of 3 positions shown; findings below may reference images not displayed]

FINDINGS: PA chest: Lungs are clear. Heart size and pulmonary vascularity are
normal. No adenopathy. There is arthropathy in both shoulders.

Supine and upright abdomen: There are multiple loops of dilated
small bowel with multiple air-fluid levels. The appearance is
consistent with bowel obstruction. No free air. There are
phleboliths in the pelvis.
IMPRESSION: Findings consistent with small bowel obstruction. No free air. No
edema or consolidation.

## 2014-03-27 ENCOUNTER — Ambulatory Visit (INDEPENDENT_AMBULATORY_CARE_PROVIDER_SITE_OTHER): Payer: Medicare Other | Admitting: Cardiovascular Disease

## 2014-03-27 ENCOUNTER — Encounter: Payer: Self-pay | Admitting: Cardiovascular Disease

## 2014-03-27 VITALS — BP 120/82 | HR 53 | Ht 67.5 in | Wt 150.5 lb

## 2014-03-27 DIAGNOSIS — R9431 Abnormal electrocardiogram [ECG] [EKG]: Secondary | ICD-10-CM

## 2014-03-27 DIAGNOSIS — C61 Malignant neoplasm of prostate: Secondary | ICD-10-CM

## 2014-03-27 NOTE — Patient Instructions (Signed)
You are doing well. No medication changes were made.  Please call us if you have new issues that need to be addressed before your next appt.    

## 2014-03-27 NOTE — Progress Notes (Signed)
Patient ID: Keith Bates, male    DOB: 28-May-1941, 73 y.o.   MRN: [id number]  HPI Comments: Mr. Keith Bates is a very pleasant 73 year old gentleman, patient of Dr. Luan Pulling, diagnosis of prostate cancer, status post prostate surgery in 2014 , remote history of smoking when he was young, previously evaluated for abnormal EKG. he presents for routine followup  In general he reports that he is doing well. He reports having successful prostate surgery. He is a collecting not to have radiation treatment. He also reports having a ureter stent last year and a recovery of 4 small kidney stones  He denies having any significant shortness of breath or chest pain with exertion. 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   He is able to lift heavy items, he has is occasional GERD symptoms.  He reports having no history of cardiac disease. No significant family history of CAD  EKG today shows normal sinus rhythm with rate 53 beats a minute, poor R-wave progression to the anterior precordial leads, unable to exclude old anteroseptal infarct   in the hospital in 2008 with Dr.CAllwood with echocardiogram and stress test at that time. By his report, these were normal. EKG in 2008 showed nonspecific T wave abnormality in leads V4 through V6, lead 3 and aVF   Outpatient Encounter Prescriptions as of 03/27/2014  Medication Sig  . loratadine (CLARITIN) 10 MG tablet Take 10 mg by mouth daily.  . [DISCONTINUED] fluticasone (FLONASE) 50 MCG/ACT nasal spray Place 2 sprays into the nose daily.  . [DISCONTINUED] HYDROcodone-acetaminophen (NORCO) 5-325 MG per tablet Take 1-2 tablets by mouth every 6 (six) hours as needed.  . [DISCONTINUED] omeprazole (PRILOSEC) 20 MG capsule Take 20 mg by mouth daily.    Review of Systems  Constitutional: Negative.   HENT: Negative.   Eyes: Negative.   Respiratory: Negative.   Cardiovascular: Negative.   Gastrointestinal: Negative.   Endocrine: Negative.   Musculoskeletal:  Negative.   Skin: Negative.   Allergic/Immunologic: Negative.   Neurological: Negative.   Hematological: Negative.   Psychiatric/Behavioral: Negative.   All other systems reviewed and are negative.   BP 120/82  Pulse 53  Ht 5' 7.5" (1.715 m)  Wt 150 lb 8 oz (68.266 kg)  BMI 23.21 kg/m2  Physical Exam  Nursing note and vitals reviewed. Constitutional: He is oriented to person, place, and time. He appears well-developed and well-nourished.  HENT:  Head: Normocephalic.  Nose: Nose normal.  Mouth/Throat: Oropharynx is clear and moist.  Eyes: Conjunctivae are normal. Pupils are equal, round, and reactive to light.  Neck: Normal range of motion. Neck supple. No JVD present.  Cardiovascular: Normal rate, regular rhythm, S1 normal, S2 normal, normal heart sounds and intact distal pulses.  Exam reveals no gallop and no friction rub.   No murmur heard. Pulmonary/Chest: Effort normal and breath sounds normal. No respiratory distress. He has no wheezes. He has no rales. He exhibits no tenderness.  Abdominal: Soft. Bowel sounds are normal. He exhibits no distension. There is no tenderness.  Musculoskeletal: Normal range of motion. He exhibits no edema and no tenderness.  Lymphadenopathy:    He has no cervical adenopathy.  Neurological: He is alert and oriented to person, place, and time. Coordination normal.  Skin: Skin is warm and dry. No rash noted. No erythema.  Psychiatric: He has a normal mood and affect. His behavior is normal. Judgment and thought content normal.      Assessment and Plan

## 2014-03-27 NOTE — Assessment & Plan Note (Signed)
EKG today is unchanged from prior EKGs. He is asymptomatic. No further testing ordered

## 2014-03-27 NOTE — Assessment & Plan Note (Signed)
Reports that he is cancer free. Recent PSA is undetectable. He is not choosing to proceed with radiation treatment

## 2015-01-15 ENCOUNTER — Other Ambulatory Visit: Payer: Self-pay | Admitting: Urology

## 2015-01-15 DIAGNOSIS — C61 Malignant neoplasm of prostate: Secondary | ICD-10-CM

## 2015-01-27 ENCOUNTER — Ambulatory Visit (HOSPITAL_COMMUNITY): Payer: Self-pay

## 2015-01-27 ENCOUNTER — Encounter (HOSPITAL_COMMUNITY): Payer: Self-pay

## 2015-02-05 ENCOUNTER — Encounter (HOSPITAL_COMMUNITY)
Admission: RE | Admit: 2015-02-05 | Discharge: 2015-02-05 | Disposition: A | Discharge status: Home / Self Care | Payer: Medicare Other | Source: Ambulatory Visit | Attending: Urology | Admitting: Urology

## 2015-02-05 DIAGNOSIS — C61 Malignant neoplasm of prostate: Secondary | ICD-10-CM | POA: Diagnosis present

## 2015-02-05 MED ORDER — TECHNETIUM TC 99M MEDRONATE IV KIT
25.0000 | PACK | Freq: Once | INTRAVENOUS | Status: AC | PRN
  Administered 2015-02-05: 25 via INTRAVENOUS

## 2015-08-21 ENCOUNTER — Other Ambulatory Visit: Payer: Self-pay | Admitting: Family Medicine

## 2016-05-31 ENCOUNTER — Other Ambulatory Visit: Payer: Self-pay | Admitting: Family Medicine

## 2016-08-31 ENCOUNTER — Encounter: Payer: Self-pay | Admitting: Family Medicine

## 2017-01-12 ENCOUNTER — Encounter: Payer: Self-pay | Admitting: Family Medicine

## 2017-01-12 ENCOUNTER — Ambulatory Visit (INDEPENDENT_AMBULATORY_CARE_PROVIDER_SITE_OTHER): Payer: 59 | Admitting: Family Medicine

## 2017-01-12 ENCOUNTER — Other Ambulatory Visit: Payer: Self-pay | Admitting: Family Medicine

## 2017-01-12 VITALS — BP 123/74 | HR 55 | Temp 98.4°F | Resp 16 | Ht 67.5 in | Wt 164.0 lb

## 2017-01-12 DIAGNOSIS — Z7689 Persons encountering health services in other specified circumstances: Secondary | ICD-10-CM

## 2017-01-12 DIAGNOSIS — R7309 Other abnormal glucose: Secondary | ICD-10-CM

## 2017-01-12 DIAGNOSIS — M150 Primary generalized (osteo)arthritis: Secondary | ICD-10-CM

## 2017-01-12 DIAGNOSIS — D649 Anemia, unspecified: Secondary | ICD-10-CM

## 2017-01-12 DIAGNOSIS — C61 Malignant neoplasm of prostate: Secondary | ICD-10-CM | POA: Diagnosis not present

## 2017-01-12 DIAGNOSIS — M159 Polyosteoarthritis, unspecified: Secondary | ICD-10-CM

## 2017-01-12 DIAGNOSIS — R7303 Prediabetes: Secondary | ICD-10-CM | POA: Insufficient documentation

## 2017-01-12 DIAGNOSIS — Z0000 Encounter for general adult medical examination without abnormal findings: Secondary | ICD-10-CM

## 2017-01-12 DIAGNOSIS — M069 Rheumatoid arthritis, unspecified: Secondary | ICD-10-CM | POA: Insufficient documentation

## 2017-01-12 DIAGNOSIS — R799 Abnormal finding of blood chemistry, unspecified: Secondary | ICD-10-CM

## 2017-01-12 MED ORDER — DICLOFENAC SODIUM 1 % TD GEL: 2.0000 g | Freq: Three times a day (TID) | TRANSDERMAL | 3 refills | Status: DC | PRN

## 2017-01-12 NOTE — Progress Notes (Signed)
Subjective:    Patient ID: Keith Bates, male    DOB: [birth year], 76 y.o.   MRN: [id number]  Keith Bates is a 76 y.o. male presenting on 01/12/2017 for Establish Care  Re-establish care with [facility]. He was previously followed by Dr Luan Pulling, last visit >3 years ago.  HPI   Pre-Diabetes vs Borderline Elevated Glucose Reports no concerns Meds: none Currently not on ACEi or ARB Lifestyle: - Diet (Limits sugars, fatty foods. Drinks mostly water 6-9 glasses daily, rarely drinks soda)  - Exercise (active outdoors, walking and yardwork lawnmower, does some stretches daily) Denies hypoglycemia, polyuria, visual changes, numbness or tingling.  Prostate Cancer - Followed by Dr Tresa Moore, s/p robotic prostatectomy 06/2013, now receives injection q 6 months - No new concerns. 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 Arthritis   . DDD (degenerative disc disease)   . Enlarged prostate   . Environmental allergies   . GERD (gastroesophageal reflux disease)   . Hx of ulcer disease   . Loss of weight   . Nocturia   . Prostate cancer ([place])   . Unspecified hemorrhoids without mention of complication    Past Surgical History:  Procedure Laterality Date  . COLONOSCOPY  03/13/2013  . HEMORRHOID BANDING    . LYMPHADENECTOMY Bilateral 06/21/2013   Procedure: LYMPHADENECTOMY  " BILATERAL PELVIC LYMPH NODE DISSECTION ;  Surgeon: Alexis Frock, MD;  Location: [facility];  Service: Urology;  Laterality: Bilateral;  . ROBOT ASSISTED LAPAROSCOPIC RADICAL PROSTATECTOMY N/A 06/21/2013   Procedure: ROBOTIC ASSISTED  LAPAROSCOPIC RADICAL PROSTATECTOMY;  Surgeon: Alexis Frock, MD;  Location: [facility];  Service: Urology;  Laterality: N/A;   Social History   Social History  . Marital status: Married    Spouse name: N/A  . Number of children: N/A  . Years of education: N/A   Occupational History  . Not on file.   Social History Main Topics  . Smoking status: Former Smoker    Packs/day: 1.50    Years: 10.00    Types: Cigarettes    Quit date: 06/06/1972  . Smokeless tobacco: Never Used  . Alcohol use No  . Drug use: No  . Sexual activity: Not Currently   Other Topics Concern  . Not on file   Social History Narrative  . No narrative on file   Family History  Problem Relation Age of Onset  . Prostate cancer Father        with mets to nodes  . Cancer Brother        colon and prostate  . Heart attack Mother    Current Outpatient Prescriptions on File Prior to Visit  Medication Sig  . fluticasone (FLONASE) 50 MCG/ACT nasal spray SPRAY 2 SPRAYS INTO EACH NOSTRIL EVERY DAY  . loratadine (CLARITIN) 10 MG tablet Take 10 mg by mouth daily.   No current facility-administered medications on file prior to visit.     Review of Systems  Constitutional: Negative for activity change, appetite change, chills, diaphoresis, fatigue, fever and unexpected weight change.  HENT: Negative for congestion, hearing loss  and sinus pressure.   Eyes: Negative for visual disturbance.  Respiratory: Negative for cough, chest tightness, shortness of breath and wheezing.   Cardiovascular: Negative for chest pain, palpitations and leg swelling.  Gastrointestinal: Negative for abdominal pain, anal bleeding, blood in stool, constipation, diarrhea, nausea and vomiting.  Endocrine: Negative for cold intolerance and polyuria.  Genitourinary: Negative for decreased urine volume, difficulty urinating, dysuria, frequency, hematuria and testicular pain.  Musculoskeletal: Positive for arthralgias. Negative for back pain, joint  swelling and neck pain.  Skin: Negative for rash.  Allergic/Immunologic: Negative for environmental allergies.  Neurological: Negative for dizziness, weakness, light-headedness, numbness and headaches.  Hematological: Negative for adenopathy.  Psychiatric/Behavioral: Negative for behavioral problems, dysphoric mood and sleep disturbance. The patient is not nervous/anxious.    Per HPI unless specifically indicated above     Objective:    BP 123/74   Pulse (!) 55   Temp 98.4 F (36.9 C) (Oral)   Resp 16   Ht 5' 7.5" (1.715 m)   Wt 164 lb (74.4 kg)   BMI 25.31 kg/m   Wt Readings from Last 3 Encounters:  01/12/17 164 lb (74.4 kg)  03/27/14 150 lb 8 oz (68.3 kg)  07/23/13 138 lb 3.7 oz (62.7 kg)    Physical Exam  Constitutional: He is oriented to person, place, and time. He appears well-developed and well-nourished. No distress.  Well-appearing, comfortable, cooperative, pleasant  HENT:  Head: Normocephalic and atraumatic.  Mouth/Throat: Oropharynx is clear and moist.  Eyes: Conjunctivae are normal. Right eye exhibits no discharge. Left eye exhibits no discharge.  Neck: Normal range of motion. Neck supple. No thyromegaly present.  No carotid bruits  Cardiovascular: Normal rate, regular rhythm, normal heart sounds and intact distal pulses.   No murmur heard. Pulmonary/Chest: Effort normal and breath sounds normal. No respiratory distress. He has no wheezes. He has no rales.  Abdominal: Soft. Bowel sounds are normal. He exhibits no distension and no mass.  Musculoskeletal: Normal range of motion. He exhibits no edema or tenderness.  Right Hand/Wrist Inspection: Significant bulky appearance R wrist, asymmetrical, some slightly bulky MCP joints, no edema or erythema. Palpation: Non tender hand / wrist, carpal bones, including MCP, base of thumb. No distinct anatomical snuff box or scaphoid tenderness. Special Testing: Negative Tinel's median nerve Strength: 5/5 grip, thumb  opposition, wrist flex/ext Neurovascular: distally intact  Lymphadenopathy:    He has no cervical adenopathy.  Neurological: He is alert and oriented to person, place, and time.  Distal sensation to light touch intact  Skin: Skin is warm and dry. No rash noted. He is not diaphoretic. No erythema.  Right breast / chest wall with palpable large soft tissue soft mobile mass, non tender  Psychiatric: He has a normal mood and affect. His behavior is normal.  Well groomed, good eye contact, normal speech and thoughts  Nursing note and vitals reviewed.  Results for orders placed or performed during the hospital encounter of 07/23/13  Urine culture  Result Value Ref Range   Specimen Description URINE, CLEAN CATCH    Special Requests NONE    Culture  Setup Time      07/24/2013 12:52 Performed at [place]      >=100,000 COLONIES/ML Performed at [facility] (COAGULASE NEGATIVE) Note: RIFAMPIN AND GENTAMICIN SHOULD NOT BE USED AS SINGLE DRUGS FOR TREATMENT OF STAPH INFECTIONS. Performed at [facility]   Report Status  07/26/2013 FINAL    Organism ID, Bacteria STAPHYLOCOCCUS SPECIES (COAGULASE NEGATIVE)       Susceptibility   Staphylococcus species (coagulase negative) - MIC*    GENTAMICIN >=16 RESISTANT Resistant     LEVOFLOXACIN >=8 RESISTANT Resistant     NITROFURANTOIN <=16 SENSITIVE Sensitive     OXACILLIN >=4 RESISTANT Resistant     PENICILLIN >=0.5 RESISTANT Resistant     RIFAMPIN <=0.5 SENSITIVE Sensitive     TRIMETH/SULFA 160 RESISTANT Resistant     VANCOMYCIN 1 SENSITIVE Sensitive     TETRACYCLINE >=16 RESISTANT Resistant     * STAPHYLOCOCCUS SPECIES (COAGULASE NEGATIVE)  Urine culture  Result Value Ref Range   Specimen Description URINE, RANDOM    Special Requests NONE    Culture  Setup Time      07/27/2013 18:55 Performed at [place] Performed at  [facility]    Culture NO GROWTH Performed at [facility]    Report Status 07/28/2013 FINAL   CBC with Differential  Result Value Ref Range   WBC 10.9 (H) 4.0 - 10.5 K/uL   RBC 4.74 4.22 - 5.81 MIL/uL   Hemoglobin 14.9 13.0 - 17.0 g/dL   HCT 43.3 39.0 - 52.0 %   MCV 91.4 78.0 - 100.0 fL   MCH 31.4 26.0 - 34.0 pg   MCHC 34.4 30.0 - 36.0 g/dL   RDW 13.5 11.5 - 15.5 %   Platelets 374 150 - 400 K/uL   Neutrophils Relative % 76 43 - 77 %   Neutro Abs 8.4 (H) 1.7 - 7.7 K/uL   Lymphocytes Relative 13 12 - 46 %   Lymphs Abs 1.4 0.7 - 4.0 K/uL   Monocytes Relative 10 3 - 12 %   Monocytes Absolute 1.1 (H) 0.1 - 1.0 K/uL   Eosinophils Relative 0 0 - 5 %   Eosinophils Absolute 0.0 0.0 - 0.7 K/uL   Basophils Relative 0 0 - 1 %   Basophils Absolute 0.0 0.0 - 0.1 K/uL  Comprehensive metabolic panel  Result Value Ref Range   Sodium 132 (L) 135 - 145 mEq/L   Potassium 3.8 3.5 - 5.1 mEq/L   Chloride 86 (L) 96 - 112 mEq/L   CO2 34 (H) 19 - 32 mEq/L   Glucose, Bld 128 (H) 70 - 99 mg/dL   BUN 24 (H) 6 - 23 mg/dL   Creatinine, Ser 1.34 0.50 - 1.35 mg/dL   Calcium 10.9 (H) 8.4 - 10.5 mg/dL   Total Protein 8.2 6.0 - 8.3 g/dL   Albumin 4.0 3.5 - 5.2 g/dL   AST 18 0 - 37 U/L   ALT 23 0 - 53 U/L   Alkaline Phosphatase 112 39 - 117 U/L   Total Bilirubin 0.9 0.3 - 1.2 mg/dL   GFR calc non Af Amer 52 (L) >90 mL/min   GFR calc Af Amer 60 (L) >90 mL/min  Lipase, blood  Result Value Ref Range   Lipase 48 11 - 59 U/L  Basic metabolic panel  Result Value Ref Range   Sodium 136 135 - 145 mEq/L   Potassium 3.4 (L) 3.5 - 5.1 mEq/L   Chloride 93 (L) 96 - 112 mEq/L   CO2 30 19 - 32 mEq/L   Glucose, Bld 110 (H) 70 - 99 mg/dL   BUN 21 6 - 23 mg/dL   Creatinine, Ser 1.14 0.50 - 1.35 mg/dL   Calcium 9.9 8.4 - 10.5 mg/dL  GFR calc non Af Amer 63 (L) >90 mL/min   GFR calc Af Amer 73 (L) >90 mL/min  CBC  Result Value Ref Range   WBC 8.9 4.0 - 10.5 K/uL   RBC 4.42 4.22 - 5.81 MIL/uL     Hemoglobin 13.7 13.0 - 17.0 g/dL   HCT 40.3 39.0 - 52.0 %   MCV 91.2 78.0 - 100.0 fL   MCH 31.0 26.0 - 34.0 pg   MCHC 34.0 30.0 - 36.0 g/dL   RDW 13.5 11.5 - 15.5 %   Platelets 344 150 - 400 K/uL  Urinalysis, Routine w reflex microscopic  Result Value Ref Range   Color, Urine YELLOW YELLOW   APPearance CLOUDY (A) CLEAR   Specific Gravity, Urine 1.022 1.005 - 1.030   pH 6.0 5.0 - 8.0   Glucose, UA NEGATIVE NEGATIVE mg/dL   Hgb urine dipstick LARGE (A) NEGATIVE   Bilirubin Urine NEGATIVE NEGATIVE   Ketones, ur 15 (A) NEGATIVE mg/dL   Protein, ur 30 (A) NEGATIVE mg/dL   Urobilinogen, UA 0.2 0.0 - 1.0 mg/dL   Nitrite NEGATIVE NEGATIVE   Leukocytes, UA LARGE (A) NEGATIVE  Urine microscopic-add on  Result Value Ref Range   Squamous Epithelial / LPF RARE RARE   WBC, UA 21-50 <3 WBC/hpf   RBC / HPF 11-20 <3 RBC/hpf   Bacteria, UA FEW (A) RARE  Basic metabolic panel  Result Value Ref Range   Sodium 130 (L) 135 - 145 mEq/L   Potassium 3.3 (L) 3.5 - 5.1 mEq/L   Chloride 92 (L) 96 - 112 mEq/L   CO2 27 19 - 32 mEq/L   Glucose, Bld 127 (H) 70 - 99 mg/dL   BUN 19 6 - 23 mg/dL   Creatinine, Ser 1.12 0.50 - 1.35 mg/dL   Calcium 9.6 8.4 - 10.5 mg/dL   GFR calc non Af Amer 64 (L) >90 mL/min   GFR calc Af Amer 74 (L) >90 mL/min  CBC  Result Value Ref Range   WBC 7.8 4.0 - 10.5 K/uL   RBC 4.46 4.22 - 5.81 MIL/uL   Hemoglobin 13.7 13.0 - 17.0 g/dL   HCT 40.8 39.0 - 52.0 %   MCV 91.5 78.0 - 100.0 fL   MCH 30.7 26.0 - 34.0 pg   MCHC 33.6 30.0 - 36.0 g/dL   RDW 13.3 11.5 - 15.5 %   Platelets 332 150 - 400 K/uL  Magnesium  Result Value Ref Range   Magnesium 2.2 1.5 - 2.5 mg/dL  Basic metabolic panel  Result Value Ref Range   Sodium 132 (L) 135 - 145 mEq/L   Potassium 3.9 3.5 - 5.1 mEq/L   Chloride 95 (L) 96 - 112 mEq/L   CO2 26 19 - 32 mEq/L   Glucose, Bld 129 (H) 70 - 99 mg/dL   BUN 15 6 - 23 mg/dL   Creatinine, Ser 0.93 0.50 - 1.35 mg/dL   Calcium 9.5 8.4 - 10.5 mg/dL   GFR  calc non Af Amer 83 (L) >90 mL/min   GFR calc Af Amer >90 >90 mL/min  CBC  Result Value Ref Range   WBC 7.9 4.0 - 10.5 K/uL   RBC 4.50 4.22 - 5.81 MIL/uL   Hemoglobin 13.7 13.0 - 17.0 g/dL   HCT 40.8 39.0 - 52.0 %   MCV 90.7 78.0 - 100.0 fL   MCH 30.4 26.0 - 34.0 pg   MCHC 33.6 30.0 - 36.0 g/dL   RDW 13.1 11.5 - 15.5 %  Platelets 312 150 - 400 K/uL  CBC  Result Value Ref Range   WBC 10.6 (H) 4.0 - 10.5 K/uL   RBC 4.38 4.22 - 5.81 MIL/uL   Hemoglobin 13.4 13.0 - 17.0 g/dL   HCT 40.2 39.0 - 52.0 %   MCV 91.8 78.0 - 100.0 fL   MCH 30.6 26.0 - 34.0 pg   MCHC 33.3 30.0 - 36.0 g/dL   RDW 13.0 11.5 - 15.5 %   Platelets 316 150 - 400 K/uL  Comprehensive metabolic panel  Result Value Ref Range   Sodium 132 (L) 135 - 145 mEq/L   Potassium 4.0 3.5 - 5.1 mEq/L   Chloride 94 (L) 96 - 112 mEq/L   CO2 28 19 - 32 mEq/L   Glucose, Bld 113 (H) 70 - 99 mg/dL   BUN 15 6 - 23 mg/dL   Creatinine, Ser 0.99 0.50 - 1.35 mg/dL   Calcium 9.5 8.4 - 10.5 mg/dL   Total Protein 6.9 6.0 - 8.3 g/dL   Albumin 3.2 (L) 3.5 - 5.2 g/dL   AST 15 0 - 37 U/L   ALT 14 0 - 53 U/L   Alkaline Phosphatase 94 39 - 117 U/L   Total Bilirubin 0.8 0.3 - 1.2 mg/dL   GFR calc non Af Amer 80 (L) >90 mL/min   GFR calc Af Amer >90 >90 mL/min  Prealbumin  Result Value Ref Range   Prealbumin 21.3 17.0 - 34.0 mg/dL  Magnesium  Result Value Ref Range   Magnesium 2.2 1.5 - 2.5 mg/dL  Phosphorus  Result Value Ref Range   Phosphorus 3.0 2.3 - 4.6 mg/dL  Triglycerides  Result Value Ref Range   Triglycerides 79 <150 mg/dL  Differential  Result Value Ref Range   Neutrophils Relative % 72 43 - 77 %   Neutro Abs 7.6 1.7 - 7.7 K/uL   Lymphocytes Relative 13 12 - 46 %   Lymphs Abs 1.4 0.7 - 4.0 K/uL   Monocytes Relative 14 (H) 3 - 12 %   Monocytes Absolute 1.5 (H) 0.1 - 1.0 K/uL   Eosinophils Relative 1 0 - 5 %   Eosinophils Absolute 0.1 0.0 - 0.7 K/uL   Basophils Relative 0 0 - 1 %   Basophils Absolute 0.0 0.0 - 0.1  K/uL  Urinalysis, Routine w reflex microscopic  Result Value Ref Range   Color, Urine AMBER (A) YELLOW   APPearance TURBID (A) CLEAR   Specific Gravity, Urine 1.025 1.005 - 1.030   pH 6.0 5.0 - 8.0   Glucose, UA NEGATIVE NEGATIVE mg/dL   Hgb urine dipstick LARGE (A) NEGATIVE   Bilirubin Urine NEGATIVE NEGATIVE   Ketones, ur NEGATIVE NEGATIVE mg/dL   Protein, ur 100 (A) NEGATIVE mg/dL   Urobilinogen, UA 0.2 0.0 - 1.0 mg/dL   Nitrite NEGATIVE NEGATIVE   Leukocytes, UA LARGE (A) NEGATIVE  Urine microscopic-add on  Result Value Ref Range   WBC, UA TOO NUMEROUS TO COUNT <3 WBC/hpf   RBC / HPF 11-20 <3 RBC/hpf   Bacteria, UA MANY (A) RARE   Casts GRANULAR CAST (A) NEGATIVE  Glucose, capillary  Result Value Ref Range   Glucose-Capillary 119 (H) 70 - 99 mg/dL  Glucose, capillary  Result Value Ref Range   Glucose-Capillary 110 (H) 70 - 99 mg/dL  Glucose, capillary  Result Value Ref Range   Glucose-Capillary 112 (H) 70 - 99 mg/dL  Basic metabolic panel  Result Value Ref Range   Sodium 134 (L) 135 -  145 mEq/L   Potassium 3.6 3.5 - 5.1 mEq/L   Chloride 99 96 - 112 mEq/L   CO2 28 19 - 32 mEq/L   Glucose, Bld 105 (H) 70 - 99 mg/dL   BUN 15 6 - 23 mg/dL   Creatinine, Ser 0.86 0.50 - 1.35 mg/dL   Calcium 8.7 8.4 - 10.5 mg/dL   GFR calc non Af Amer 85 (L) >90 mL/min   GFR calc Af Amer >90 >90 mL/min  CBC  Result Value Ref Range   WBC 9.1 4.0 - 10.5 K/uL   RBC 4.00 (L) 4.22 - 5.81 MIL/uL   Hemoglobin 12.3 (L) 13.0 - 17.0 g/dL   HCT 36.0 (L) 39.0 - 52.0 %   MCV 90.0 78.0 - 100.0 fL   MCH 30.8 26.0 - 34.0 pg   MCHC 34.2 30.0 - 36.0 g/dL   RDW 12.8 11.5 - 15.5 %   Platelets 244 150 - 400 K/uL  Magnesium  Result Value Ref Range   Magnesium 2.1 1.5 - 2.5 mg/dL  Phosphorus  Result Value Ref Range   Phosphorus 2.9 2.3 - 4.6 mg/dL  Vancomycin, trough  Result Value Ref Range   Vancomycin Tr 6.5 (L) 10.0 - 20.0 ug/mL  Glucose, capillary  Result Value Ref Range   Glucose-Capillary  123 (H) 70 - 99 mg/dL   Comment 1 Documented in Chart    Comment 2 Notify RN   Glucose, capillary  Result Value Ref Range   Glucose-Capillary 131 (H) 70 - 99 mg/dL  Glucose, capillary  Result Value Ref Range   Glucose-Capillary 102 (H) 70 - 99 mg/dL   Comment 1 Documented in Chart    Comment 2 Notify RN   Glucose, capillary  Result Value Ref Range   Glucose-Capillary 126 (H) 70 - 99 mg/dL   Comment 1 Documented in Chart    Comment 2 Notify RN   Glucose, capillary  Result Value Ref Range   Glucose-Capillary 121 (H) 70 - 99 mg/dL  Glucose, capillary  Result Value Ref Range   Glucose-Capillary 100 (H) 70 - 99 mg/dL  Glucose, capillary  Result Value Ref Range   Glucose-Capillary 101 (H) 70 - 99 mg/dL  CBC  Result Value Ref Range   WBC 8.0 4.0 - 10.5 K/uL   RBC 3.75 (L) 4.22 - 5.81 MIL/uL   Hemoglobin 11.5 (L) 13.0 - 17.0 g/dL   HCT 34.0 (L) 39.0 - 52.0 %   MCV 90.7 78.0 - 100.0 fL   MCH 30.7 26.0 - 34.0 pg   MCHC 33.8 30.0 - 36.0 g/dL   RDW 12.7 11.5 - 15.5 %   Platelets 232 150 - 400 K/uL  Comprehensive metabolic panel  Result Value Ref Range   Sodium 133 (L) 135 - 145 mEq/L   Potassium 3.1 (L) 3.5 - 5.1 mEq/L   Chloride 96 96 - 112 mEq/L   CO2 29 19 - 32 mEq/L   Glucose, Bld 127 (H) 70 - 99 mg/dL   BUN 13 6 - 23 mg/dL   Creatinine, Ser 0.76 0.50 - 1.35 mg/dL   Calcium 8.6 8.4 - 10.5 mg/dL   Total Protein 5.6 (L) 6.0 - 8.3 g/dL   Albumin 2.6 (L) 3.5 - 5.2 g/dL   AST 11 0 - 37 U/L   ALT 8 0 - 53 U/L   Alkaline Phosphatase 68 39 - 117 U/L   Total Bilirubin 0.3 0.3 - 1.2 mg/dL   GFR calc non Af Amer 90 (L) >90 mL/min  GFR calc Af Amer >90 >90 mL/min  Magnesium  Result Value Ref Range   Magnesium 2.1 1.5 - 2.5 mg/dL  Phosphorus  Result Value Ref Range   Phosphorus 2.9 2.3 - 4.6 mg/dL  Differential  Result Value Ref Range   Neutrophils Relative % 71 43 - 77 %   Neutro Abs 5.7 1.7 - 7.7 K/uL   Lymphocytes Relative 15 12 - 46 %   Lymphs Abs 1.2 0.7 - 4.0 K/uL     Monocytes Relative 12 3 - 12 %   Monocytes Absolute 1.0 0.1 - 1.0 K/uL   Eosinophils Relative 2 0 - 5 %   Eosinophils Absolute 0.1 0.0 - 0.7 K/uL   Basophils Relative 0 0 - 1 %   Basophils Absolute 0.0 0.0 - 0.1 K/uL  Triglycerides  Result Value Ref Range   Triglycerides 86 <150 mg/dL  Prealbumin  Result Value Ref Range   Prealbumin 16.8 (L) 17.0 - 34.0 mg/dL  Glucose, capillary  Result Value Ref Range   Glucose-Capillary 131 (H) 70 - 99 mg/dL  Glucose, capillary  Result Value Ref Range   Glucose-Capillary 134 (H) 70 - 99 mg/dL  Glucose, capillary  Result Value Ref Range   Glucose-Capillary 121 (H) 70 - 99 mg/dL  Glucose, capillary  Result Value Ref Range   Glucose-Capillary 113 (H) 70 - 99 mg/dL  Glucose, capillary  Result Value Ref Range   Glucose-Capillary 107 (H) 70 - 99 mg/dL  Glucose, capillary  Result Value Ref Range   Glucose-Capillary 114 (H) 70 - 99 mg/dL  Iron and TIBC  Result Value Ref Range   Iron 36 (L) 42 - 135 ug/dL   TIBC 221 215 - 435 ug/dL   Saturation Ratios 16 (L) 20 - 55 %   UIBC 185 125 - 400 ug/dL  Ferritin  Result Value Ref Range   Ferritin 368 (H) 22 - 322 ng/mL  Transferrin  Result Value Ref Range   Transferrin 157 (L) 200 - 360 mg/dL  Folate RBC  Result Value Ref Range   RBC Folate 808 (H) >280 ng/mL  Vitamin B12  Result Value Ref Range   Vitamin B-12 646 211 - 911 pg/mL  Glucose, capillary  Result Value Ref Range   Glucose-Capillary 107 (H) 70 - 99 mg/dL   Comment 1 Documented in Chart    Comment 2 Notify RN   Glucose, capillary  Result Value Ref Range   Glucose-Capillary 108 (H) 70 - 99 mg/dL   Comment 1 Documented in Chart    Comment 2 Notify RN   Glucose, capillary  Result Value Ref Range   Glucose-Capillary 104 (H) 70 - 99 mg/dL   Comment 1 Documented in Chart    Comment 2 Notify RN   Glucose, capillary  Result Value Ref Range   Glucose-Capillary 124 (H) 70 - 99 mg/dL  Glucose, capillary  Result Value Ref Range    Glucose-Capillary 118 (H) 70 - 99 mg/dL  Basic metabolic panel  Result Value Ref Range   Sodium 134 (L) 135 - 145 mEq/L   Potassium 2.7 (LL) 3.5 - 5.1 mEq/L   Chloride 105 96 - 112 mEq/L   CO2 24 19 - 32 mEq/L   Glucose, Bld 103 (H) 70 - 99 mg/dL   BUN 12 6 - 23 mg/dL   Creatinine, Ser 0.62 0.50 - 1.35 mg/dL   Calcium 7.4 (L) 8.4 - 10.5 mg/dL   GFR calc non Af Amer >90 >90 mL/min   GFR  calc Af Amer >90 >90 mL/min  Glucose, capillary  Result Value Ref Range   Glucose-Capillary 109 (H) 70 - 99 mg/dL  Glucose, capillary  Result Value Ref Range   Glucose-Capillary 100 (H) 70 - 99 mg/dL   Comment 1 Documented in Chart    Comment 2 Notify RN   Glucose, capillary  Result Value Ref Range   Glucose-Capillary 114 (H) 70 - 99 mg/dL   Comment 1 Documented in Chart    Comment 2 Notify RN   Glucose, capillary  Result Value Ref Range   Glucose-Capillary 118 (H) 70 - 99 mg/dL   Comment 1 Documented in Chart    Comment 2 Notify RN   Glucose, capillary  Result Value Ref Range   Glucose-Capillary 112 (H) 70 - 99 mg/dL  Glucose, capillary  Result Value Ref Range   Glucose-Capillary 113 (H) 70 - 99 mg/dL   Comment 1 Documented in Chart    Comment 2 Notify RN   Comprehensive metabolic panel  Result Value Ref Range   Sodium 135 135 - 145 mEq/L   Potassium 3.8 3.5 - 5.1 mEq/L   Chloride 103 96 - 112 mEq/L   CO2 25 19 - 32 mEq/L   Glucose, Bld 104 (H) 70 - 99 mg/dL   BUN 11 6 - 23 mg/dL   Creatinine, Ser 0.71 0.50 - 1.35 mg/dL   Calcium 8.8 8.4 - 10.5 mg/dL   Total Protein 5.7 (L) 6.0 - 8.3 g/dL   Albumin 2.6 (L) 3.5 - 5.2 g/dL   AST 13 0 - 37 U/L   ALT 12 0 - 53 U/L   Alkaline Phosphatase 67 39 - 117 U/L   Total Bilirubin 0.3 0.3 - 1.2 mg/dL   GFR calc non Af Amer >90 >90 mL/min   GFR calc Af Amer >90 >90 mL/min  Magnesium  Result Value Ref Range   Magnesium 2.2 1.5 - 2.5 mg/dL  Phosphorus  Result Value Ref Range   Phosphorus 3.2 2.3 - 4.6 mg/dL  Glucose, capillary  Result  Value Ref Range   Glucose-Capillary 91 70 - 99 mg/dL  Glucose, capillary  Result Value Ref Range   Glucose-Capillary 111 (H) 70 - 99 mg/dL  Glucose, capillary  Result Value Ref Range   Glucose-Capillary 116 (H) 70 - 99 mg/dL  Glucose, capillary  Result Value Ref Range   Glucose-Capillary 100 (H) 70 - 99 mg/dL  Glucose, capillary  Result Value Ref Range   Glucose-Capillary 107 (H) 70 - 99 mg/dL   Comment 1 Documented in Chart    Comment 2 Notify RN   Glucose, capillary  Result Value Ref Range   Glucose-Capillary 93 70 - 99 mg/dL   Comment 1 Notify RN    Comment 2 Documented in Chart       Assessment & Plan:   Problem List Items Addressed This Visit    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 Reviewed management with conservative therapy - advised to start with regular Tylenol dosing first, limit excessive NSAIDs orally. And start new rx topical Diclofenac TID PRN 2. RICE therapy 3. Follow-up in future if flares, can consider X-ray - will need chemistry in future for monitoring Cr since on NSAIDs      Relevant Medications   ibuprofen (ADVIL,MOTRIN) 200 MG tablet   diclofenac sodium (VOLTAREN) 1 % GEL   Abnormal glucose    History of elevated glucose. No pre-DM or DM Monitoring A1c routinely Encourage improved lifestyle       Other Visit Diagnoses    Encounter to establish care with new doctor          Meds ordered this encounter  Medications   . ibuprofen (ADVIL,MOTRIN) 200 MG tablet    Sig: Take 200 mg by mouth every 6 (six) hours as needed.  . diclofenac sodium (VOLTAREN) 1 % GEL    Sig: Apply 2 g topically 3 (three) times daily as needed. For Right wrist arthritis    Dispense:  100 g    Refill:  3    Follow up plan: Return in about 3 months (around 04/14/2017) for Annual Physical.  Nobie Putnam, DO [facility] 01/12/2017, 5:36 PM

## 2017-01-12 NOTE — Assessment & Plan Note (Signed)
History of elevated glucose. No pre-DM or DM Monitoring A1c routinely Encourage improved lifestyle

## 2017-01-12 NOTE — Assessment & Plan Note (Signed)
Suspected RA based on history vs likely mixed OA/DJD, with repetitive stress - See A&P for OA

## 2017-01-12 NOTE — Assessment & Plan Note (Signed)
Stable, s/p robotic prostatectomy and hormone therapy Followed by Urology Dr Tresa Moore, PSA monitoring

## 2017-01-12 NOTE — Patient Instructions (Addendum)
Thank you for coming to the clinic today.  1. Keep up the good work!  Stay active and continue drinking water, eating healthy.  Recommend to start taking Tylenol Extra Strength 500mg  tabs - take 1 to 2 tabs per dose (max 1000mg ) every 6-8 hours for pain (take regularly, don't skip a dose for next 7 days), max 24 hour daily dose is 6 tablets or 3000mg . In the future you can repeat the same everyday Tylenol course for 1-2 weeks at a time.  Next try the Voltaren (Diclofenac) topical anti-inflammatory gel - use it up to 3 times a day as needed for usually 1-2 weeks at a time.  You may still take Motrin only occasionally, prefer to limit this use as it can harm your kidneys.  You will be due for FASTING BLOOD WORK (no food or drink after midnight before, only water or coffee without cream/sugar on the morning of)  - Please go ahead and schedule a "Lab Only" visit in the morning at the clinic for lab draw in 3 months  - Make sure Lab Only appointment is at least 1-2 weeks before your next appointment, so that results will be available  For Lab Results, once available within 2-3 days of blood draw, you can can log in to MyChart online to view your results and a brief explanation. Also, we can discuss results at next follow-up visit.  Please schedule a Follow-up Appointment to: Return in about 3 months (around 04/14/2017) for Annual Physical.  If you have any other questions or concerns, please feel free to call the clinic or send a message through [place]. You may also schedule an earlier appointment if necessary.  Nobie Putnam, DO [place]

## 2017-01-12 NOTE — Assessment & Plan Note (Signed)
Stable with intermittent joint pain flares, now improved s/p less repetitive activity with R hand/wrist - No recent imaging  Plan: 1. Reviewed management with conservative therapy - advised to start with regular Tylenol dosing first, limit excessive NSAIDs orally. And start new rx topical Diclofenac TID PRN 2. RICE therapy 3. Follow-up in future if flares, can consider X-ray - will need chemistry in future for monitoring Cr since on NSAIDs

## 2017-01-13 ENCOUNTER — Telehealth: Payer: Self-pay | Admitting: Family Medicine

## 2017-01-13 DIAGNOSIS — M150 Primary generalized (osteo)arthritis: Secondary | ICD-10-CM

## 2017-01-13 DIAGNOSIS — M159 Polyosteoarthritis, unspecified: Secondary | ICD-10-CM

## 2017-01-13 DIAGNOSIS — M069 Rheumatoid arthritis, unspecified: Secondary | ICD-10-CM

## 2017-01-13 MED ORDER — MELOXICAM 7.5 MG PO TABS: 7.5000 mg | ORAL_TABLET | Freq: Every day | ORAL | 2 refills | Status: DC | PRN

## 2017-01-13 NOTE — Telephone Encounter (Signed)
Patient seen on 01/12/17 to re-establish care. He was prescribed Diclofenac topical for hand/wrist pain and joint deformity with OA/RA. This PA was denied.  He may take an alternative NSAID rx such as Meloxicam, I will go ahead and send this to pharmacy.  Called patient and LVM notifying that topical not covered, sent new rx med, he may call back with questions. Main instruction is to NOT take the Meloxicam with Ibuprofen or other anti-inflammatory (advil, aleve, naproxen, motrin). Tylenol is SAFE to take.  Nobie Putnam, [facility] 01/13/2017, 4:37 PM

## 2017-04-17 ENCOUNTER — Other Ambulatory Visit: Payer: 59

## 2017-04-17 DIAGNOSIS — Z0000 Encounter for general adult medical examination without abnormal findings: Secondary | ICD-10-CM

## 2017-04-17 DIAGNOSIS — R7309 Other abnormal glucose: Secondary | ICD-10-CM

## 2017-04-17 DIAGNOSIS — D649 Anemia, unspecified: Secondary | ICD-10-CM

## 2017-04-17 DIAGNOSIS — R799 Abnormal finding of blood chemistry, unspecified: Secondary | ICD-10-CM

## 2017-04-18 LAB — COMPLETE METABOLIC PANEL WITH GFR
AG RATIO: 1.7 (calc) (ref 1.0–2.5)
ALBUMIN MSPROF: 4 g/dL (ref 3.6–5.1)
ALT: 18 U/L (ref 9–46)
AST: 22 U/L (ref 10–35)
Alkaline phosphatase (APISO): 76 U/L (ref 40–115)
BILIRUBIN TOTAL: 0.8 mg/dL (ref 0.2–1.2)
BUN: 22 mg/dL (ref 7–25)
CALCIUM: 9.2 mg/dL (ref 8.6–10.3)
CHLORIDE: 105 mmol/L (ref 98–110)
CO2: 26 mmol/L (ref 20–32)
Creat: 0.75 mg/dL (ref 0.70–1.18)
GFR, EST NON AFRICAN AMERICAN: 90 mL/min/{1.73_m2} (ref >=60)
GFR, Est African American: 104 mL/min/{1.73_m2} (ref >=60)
GLOBULIN: 2.4 g/dL (ref 1.9–3.7)
Glucose, Bld: 98 mg/dL (ref 65–99)
POTASSIUM: 4.1 mmol/L (ref 3.5–5.3)
Sodium: 141 mmol/L (ref 135–146)
Total Protein: 6.4 g/dL (ref 6.1–8.1)

## 2017-04-18 LAB — LIPID PANEL
Cholesterol: 199 mg/dL (ref <200)
HDL: 66 mg/dL (ref >40)
LDL Cholesterol (Calc): 118 mg/dL (calc) — ABNORMAL HIGH
NON-HDL CHOLESTEROL (CALC): 133 mg/dL — AB (ref <130)
Total CHOL/HDL Ratio: 3 (calc) (ref <5.0)
Triglycerides: 54 mg/dL (ref <150)

## 2017-04-18 LAB — CBC WITH DIFFERENTIAL/PLATELET
Basophils Absolute: 58 cells/uL (ref 0–200)
Basophils Relative: 1 %
Eosinophils Absolute: 151 cells/uL (ref 15–500)
Eosinophils Relative: 2.6 %
HCT: 38.1 % — ABNORMAL LOW (ref 38.5–50.0)
Hemoglobin: 13.1 g/dL — ABNORMAL LOW (ref 13.2–17.1)
LYMPHS ABS: 1943 {cells}/uL (ref 850–3900)
MCH: 30.5 pg (ref 27.0–33.0)
MCHC: 34.4 g/dL (ref 32.0–36.0)
MCV: 88.6 fL (ref 80.0–100.0)
MPV: 10.5 fL (ref 7.5–12.5)
Monocytes Relative: 10.9 %
Neutro Abs: 3016 cells/uL (ref 1500–7800)
Neutrophils Relative %: 52 %
Platelets: 185 10*3/uL (ref 140–400)
RBC: 4.3 10*6/uL (ref 4.20–5.80)
RDW: 13.6 % (ref 11.0–15.0)
TOTAL LYMPHOCYTE: 33.5 %
WBC: 5.8 10*3/uL (ref 3.8–10.8)
WBCMIX: 632 {cells}/uL (ref 200–950)

## 2017-04-18 LAB — HEMOGLOBIN A1C
HEMOGLOBIN A1C: 6.2 %{Hb} — AB (ref <5.7)
Mean Plasma Glucose: 131 (calc)
eAG (mmol/L): 7.3 (calc)

## 2017-04-19 ENCOUNTER — Other Ambulatory Visit: Payer: Self-pay | Admitting: Family Medicine

## 2017-04-19 DIAGNOSIS — M159 Polyosteoarthritis, unspecified: Secondary | ICD-10-CM

## 2017-04-19 DIAGNOSIS — M069 Rheumatoid arthritis, unspecified: Secondary | ICD-10-CM

## 2017-04-19 DIAGNOSIS — M150 Primary generalized (osteo)arthritis: Secondary | ICD-10-CM

## 2017-04-20 ENCOUNTER — Ambulatory Visit (INDEPENDENT_AMBULATORY_CARE_PROVIDER_SITE_OTHER): Payer: 59 | Admitting: Family Medicine

## 2017-04-20 ENCOUNTER — Encounter: Payer: Self-pay | Admitting: Family Medicine

## 2017-04-20 VITALS — BP 135/89 | HR 51 | Temp 97.9°F | Resp 16 | Ht 67.5 in | Wt 161.0 lb

## 2017-04-20 DIAGNOSIS — M150 Primary generalized (osteo)arthritis: Secondary | ICD-10-CM

## 2017-04-20 DIAGNOSIS — R7303 Prediabetes: Secondary | ICD-10-CM | POA: Diagnosis not present

## 2017-04-20 DIAGNOSIS — E7800 Pure hypercholesterolemia, unspecified: Secondary | ICD-10-CM | POA: Diagnosis not present

## 2017-04-20 DIAGNOSIS — D649 Anemia, unspecified: Secondary | ICD-10-CM

## 2017-04-20 DIAGNOSIS — K648 Other hemorrhoids: Secondary | ICD-10-CM | POA: Diagnosis not present

## 2017-04-20 DIAGNOSIS — Z23 Encounter for immunization: Secondary | ICD-10-CM | POA: Diagnosis not present

## 2017-04-20 DIAGNOSIS — M159 Polyosteoarthritis, unspecified: Secondary | ICD-10-CM

## 2017-04-20 DIAGNOSIS — E785 Hyperlipidemia, unspecified: Secondary | ICD-10-CM | POA: Insufficient documentation

## 2017-04-20 MED ORDER — ASPIRIN EC 81 MG PO TBEC: 81.0000 mg | DELAYED_RELEASE_TABLET | Freq: Every day | ORAL | Status: DC

## 2017-04-20 MED ORDER — ACETAMINOPHEN 500 MG PO TABS: 500.0000 mg | ORAL_TABLET | Freq: Four times a day (QID) | ORAL | 0 refills | Status: DC | PRN

## 2017-04-20 MED ORDER — RED YEAST RICE 600 MG PO CAPS: ORAL_CAPSULE | ORAL | Status: DC

## 2017-04-20 NOTE — Assessment & Plan Note (Signed)
Stable, without recurrence or flare

## 2017-04-20 NOTE — Assessment & Plan Note (Signed)
Controlled Pre-DM with A1c 6.2   Plan:  1. Not on any therapy currently  2. Encourage improved lifestyle - low carb, low sugar diet, reduce portion size, continue improving regular exercise 3. Follow-up 6 months - Pre DM A1c

## 2017-04-20 NOTE — Assessment & Plan Note (Signed)
Stable with intermittent joint pain flares, now improved s/p less repetitive activity with R hand/wrist - No recent imaging  Plan: 1. Advised to increase regular dosing of Tylenol Ext Str 500-1000mg  TID 2. May continue oral NSAID Meloxicam 7.5mg  PRN only - limit use 3. Re-consider topical NSAID diclofenac at future visit if needed 4. RICE therapy 5. Follow-up in future if flares, can consider X-ray

## 2017-04-20 NOTE — Assessment & Plan Note (Signed)
Controlled cholesterol on lifestyle Last lipid panel 04/2017 Calculated ASCVD 10 yr risk score 13%, near baseline 10%  Plan: 1. Discussed ASCVD risk reduction strategies - decline statin at this time 2. Start ASA 81mg  for primary ASCVD risk reduction 3. May start OTC supplement Red Rice Yeast since he has it already at home 4. Encourage improved lifestyle - low carb/cholesterol, reduce portion size, continue improving regular exercise 5. Follow-up 6 months - lipids q 12 mo

## 2017-04-20 NOTE — Patient Instructions (Addendum)
Thank you for coming to the clinic today.  1. Keep up the good work  2. Recommend starting over the counter baby Aspirin 81mg  (enteric coated, EC) - to reduce risk of heart attack and stroke  3. Diet Recommendations for Preventing Diabetes   Reduce Starchy (carb) foods include: Bread, rice, pasta, potatoes, corn, crackers, bagels, muffins, all baked goods.   Protein foods include: Meat, fish, poultry, eggs, dairy foods, and beans such as pinto and kidney beans (beans also provide carbohydrate).   1. Eat at least 3 meals and 1-2 snacks per day. Never go more than 4-5 hours while awake without eating.   2. Limit starchy foods to TWO per meal and ONE per snack. ONE portion of a starchy  food is equal to the following:   - ONE slice of bread (or its equivalent, such as half of a hamburger bun).   - 1/2 cup of a "scoopable" starchy food such as potatoes or rice.   - 1 OUNCE (28 grams) of starchy snacks (crackers or pretzels, look on label).   - 15 grams of carbohydrate as shown on food label.   3. Both lunch and dinner should include a protein food, a carb food, and vegetables.   - Obtain twice as many veg's as protein or carbohydrate foods for both lunch and dinner.   - Try to keep frozen veg's on hand for a quick vegetable serving.     - Fresh or frozen veg's are best.   4. Breakfast should always include protein.    Please schedule a Follow-up Appointment to: Return in about 6 months (around 10/18/2017) for Pre-Diabetes A1c, Arthritis.  If you have any other questions or concerns, please feel free to call the clinic or send a message through [place]. You may also schedule an earlier appointment if necessary.  Additionally, you may be receiving a survey about your experience at our clinic within a few days to 1 week by e-mail or mail. We value your feedback.  Nobie Putnam, DO [place]

## 2017-04-20 NOTE — Progress Notes (Signed)
Subjective:    Patient ID: Keith Bates, male    DOB: Sep 09, 1940, 76 y.o.   MRN: [id number]  Keith Bates is a 76 y.o. male presenting on 04/20/2017 for Annual Exam   HPI   Pre-Diabetes: Reports no concerns, last A1c elevated at 6.2 CBGs: Not checking Meds: Never on meds Not on ACEi/ARB Lifestyle: - Diet (Admits eats some more bread and difficult to limit sweets, but he tries to eat healthier, he does eat cereal most days, does like cornbread, does eat red meat)  - Exercise (regular exercise and walking, yardwork) Denies hypoglycemia, polyuria, visual changes, numbness or tingling.  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 - Today patient reports overall stable to improved. He is using Meloxicam sparingly, only as needed usually few times week, but not everyday. Taking some Tylenol Extra Strength, again not everyday, overall some relief - Worse with weather changes or increased repetitive activity, pushing lawnmower and other  HYPERLIPIDEMIA: - Reports no concerns. Last lipid panel 04/2017, controlled  - Never on Statin medication. Not taking ASA 81 - Has Red Rice Yeast but not started taking yet  PMH Internal Hemorrhoids - no recent flares and mostly resolved, on improved diet  Health Maintenance: - Due for Flu Shot, will receive today - UTD PNA vaccines - UTD Colonoscopy - PMH Prostate Cancer - followed by [facility] Dr Tresa Moore  Past Medical History:  Diagnosis Date  . Arthritis   . DDD (degenerative disc disease)   . Enlarged prostate   . Environmental allergies   . GERD (gastroesophageal reflux disease)   . Hx of ulcer disease   . Loss of weight   . Nocturia   . Prostate cancer ([place])   . Unspecified hemorrhoids without mention of  complication    Past Surgical History:  Procedure Laterality Date  . COLONOSCOPY  03/13/2013  . HEMORRHOID BANDING    . LYMPHADENECTOMY Bilateral 06/21/2013   Procedure: LYMPHADENECTOMY  " BILATERAL PELVIC LYMPH NODE DISSECTION ;  Surgeon: Alexis Frock, MD;  Location: [facility];  Service: Urology;  Laterality: Bilateral;  . ROBOT ASSISTED LAPAROSCOPIC RADICAL PROSTATECTOMY N/A 06/21/2013   Procedure: ROBOTIC ASSISTED LAPAROSCOPIC RADICAL PROSTATECTOMY;  Surgeon: Alexis Frock, MD;  Location: [facility];  Service: Urology;  Laterality: N/A;   Social History   Social History  . Marital status: Married    Spouse name: N/A  . Number of children: N/A  . Years of education: N/A   Occupational History  . Not on file.   Social History Main Topics  . Smoking status: Former Smoker    Packs/day: 1.50    Years: 10.00    Types: Cigarettes    Quit date: 06/06/1972  . Smokeless tobacco: Former Systems developer  . Alcohol use No  . Drug use: No  . Sexual activity: Not Currently   Other Topics Concern  . Not on file   Social History Narrative  . No narrative on file   Family History  Problem Relation Age of Onset  . Prostate cancer Father        with mets to nodes  . Cancer Brother        colon and prostate  . Heart attack Mother    Current Outpatient Prescriptions on File Prior to Visit  Medication  Sig  . fluticasone (FLONASE) 50 MCG/ACT nasal spray SPRAY 2 SPRAYS INTO EACH NOSTRIL EVERY DAY  . meloxicam (MOBIC) 7.5 MG tablet TAKE 1 TABLET BY MOUTH DAILY AS NEEDED FOR PAIN FOR 1 TO 2 WEEKS DO NOT TAKE IBUPROFEN   No current facility-administered medications on file prior to visit.     Review of Systems  Constitutional: Negative for activity change, appetite change, chills, diaphoresis, fatigue, fever and unexpected weight change.  HENT: Negative for congestion, hearing loss and sinus pressure.   Eyes: Negative for visual disturbance.  Respiratory: Negative for apnea, cough, chest tightness,  shortness of breath and wheezing.   Cardiovascular: Negative for chest pain, palpitations and leg swelling.  Gastrointestinal: Negative for abdominal pain, anal bleeding, blood in stool, constipation, diarrhea, nausea and vomiting.  Endocrine: Negative for cold intolerance.  Genitourinary: Negative for difficulty urinating, dysuria, frequency and hematuria.  Musculoskeletal: Positive for arthralgias (hand, wrist). Negative for back pain and neck pain.  Skin: Negative for rash.  Allergic/Immunologic: Negative for environmental allergies.  Neurological: Negative for dizziness, weakness, light-headedness, numbness and headaches.  Hematological: Negative for adenopathy.  Psychiatric/Behavioral: Negative for behavioral problems, dysphoric mood and sleep disturbance.   Per HPI unless specifically indicated above     Objective:    BP 135/89   Pulse (!) 51   Temp 97.9 F (36.6 C) (Oral)   Resp 16   Ht 5' 7.5" (1.715 m)   Wt 161 lb (73 kg)   BMI 24.84 kg/m   Wt Readings from Last 3 Encounters:  04/20/17 161 lb (73 kg)  01/12/17 164 lb (74.4 kg)  03/27/14 150 lb 8 oz (68.3 kg)    Physical Exam  Constitutional: He is oriented to person, place, and time. He appears well-developed and well-nourished. No distress.  Well-appearing, comfortable, cooperative, pleasant  HENT:  Head: Normocephalic and atraumatic.  Mouth/Throat: Oropharynx is clear and moist.  Eyes: Conjunctivae are normal. Right eye exhibits no discharge. Left eye exhibits no discharge.  Neck: Normal range of motion. Neck supple. No thyromegaly present.  No carotid bruits  Cardiovascular: Normal rate, regular rhythm, normal heart sounds and intact distal pulses.   No murmur heard. Pulmonary/Chest: Effort normal and breath sounds normal. No respiratory distress. He has no wheezes. He has no rales.  Abdominal: Soft. Bowel sounds are normal. He exhibits no distension and no mass.  Musculoskeletal: Normal range of motion. He  exhibits no edema or tenderness.  Right Hand/Wrist Inspection: Significant bulky appearance R wrist, asymmetrical, some slightly bulky MCP joints, no edema or erythema. Palpation: Non tender hand / wrist, carpal bones, including MCP, base of thumb. No distinct anatomical snuff box or scaphoid tenderness. Strength: 5/5 grip, thumb opposition, wrist flex/ext Neurovascular: distally intact  Lymphadenopathy:    He has no cervical adenopathy.  Neurological: He is alert and oriented to person, place, and time.  Distal sensation to light touch intact  Skin: Skin is warm and dry. No rash noted. He is not diaphoretic. No erythema.  Psychiatric: He has a normal mood and affect. His behavior is normal.  Well groomed, good eye contact, normal speech and thoughts  Nursing note and vitals reviewed.  Results for orders placed or performed in visit on 04/17/17  COMPLETE METABOLIC PANEL WITH GFR  Result Value Ref Range   Glucose, Bld 98 65 - 99 mg/dL   BUN 22 7 - 25 mg/dL   Creat 0.75 0.70 - 1.18 mg/dL   GFR, Est Non African American 90 > OR =  60 mL/min/1.62m2   GFR, Est African American 104 > OR = 60 mL/min/1.32m2   BUN/Creatinine Ratio NOT APPLICABLE 6 - 22 (calc)   Sodium 141 135 - 146 mmol/L   Potassium 4.1 3.5 - 5.3 mmol/L   Chloride 105 98 - 110 mmol/L   CO2 26 20 - 32 mmol/L   Calcium 9.2 8.6 - 10.3 mg/dL   Total Protein 6.4 6.1 - 8.1 g/dL   Albumin 4.0 3.6 - 5.1 g/dL   Globulin 2.4 1.9 - 3.7 g/dL (calc)   AG Ratio 1.7 1.0 - 2.5 (calc)   Total Bilirubin 0.8 0.2 - 1.2 mg/dL   Alkaline phosphatase (APISO) 76 40 - 115 U/L   AST 22 10 - 35 U/L   ALT 18 9 - 46 U/L  Lipid panel  Result Value Ref Range   Cholesterol 199 <200 mg/dL   HDL 66 >40 mg/dL   Triglycerides 54 <150 mg/dL   LDL Cholesterol (Calc) 118 (H) mg/dL (calc)   Total CHOL/HDL Ratio 3.0 <5.0 (calc)   Non-HDL Cholesterol (Calc) 133 (H) <130 mg/dL (calc)  CBC with Differential/Platelet  Result Value Ref Range   WBC 5.8 3.8 -  10.8 Thousand/uL   RBC 4.30 4.20 - 5.80 Million/uL   Hemoglobin 13.1 (L) 13.2 - 17.1 g/dL   HCT 38.1 (L) 38.5 - 50.0 %   MCV 88.6 80.0 - 100.0 fL   MCH 30.5 27.0 - 33.0 pg   MCHC 34.4 32.0 - 36.0 g/dL   RDW 13.6 11.0 - 15.0 %   Platelets 185 140 - 400 Thousand/uL   MPV 10.5 7.5 - 12.5 fL   Neutro Abs 3,016 1,500 - 7,800 cells/uL   Lymphs Abs 1,943 850 - 3,900 cells/uL   WBC mixed population 632 200 - 950 cells/uL   Eosinophils Absolute 151 15 - 500 cells/uL   Basophils Absolute 58 0 - 200 cells/uL   Neutrophils Relative % 52 %   Total Lymphocyte 33.5 %   Monocytes Relative 10.9 %   Eosinophils Relative 2.6 %   Basophils Relative 1.0 %  Hemoglobin A1c  Result Value Ref Range   Hgb A1c MFr Bld 6.2 (H) <5.7 % of total Hgb   Mean Plasma Glucose 131 (calc)   eAG (mmol/L) 7.3 (calc)      Assessment & Plan:   Problem List Items Addressed This Visit    Pre-diabetes    Controlled Pre-DM with A1c 6.2   Plan:  1. Not on any therapy currently  2. Encourage improved lifestyle - low carb, low sugar diet, reduce portion size, continue improving regular exercise 3. Follow-up 6 months - Pre DM A1c      Osteoarthritis of multiple joints - Primary    Stable with intermittent joint pain flares, now improved s/p less repetitive activity with R hand/wrist - No recent imaging  Plan: 1. Advised to increase regular dosing of Tylenol Ext Str 500-1000mg  TID 2. May continue oral NSAID Meloxicam 7.5mg  PRN only - limit use 3. Re-consider topical NSAID diclofenac at future visit if needed 4. RICE therapy 5. Follow-up in future if flares, can consider X-ray      Relevant Medications   aspirin EC 81 MG tablet   acetaminophen (TYLENOL) 500 MG tablet   Normocytic anemia    Significantly improved on last CBC, Hgb 11 to 13 Asymptomatic Reassuring, follow-up as planned, UTD on colonoscopy screening      Internal hemorrhoids    Stable, without recurrence or flare  Relevant Medications    aspirin EC 81 MG tablet   Hyperlipidemia    Controlled cholesterol on lifestyle Last lipid panel 04/2017 Calculated ASCVD 10 yr risk score 13%, near baseline 10%  Plan: 1. Discussed ASCVD risk reduction strategies - decline statin at this time 2. Start ASA 81mg  for primary ASCVD risk reduction 3. May start OTC supplement Red Rice Yeast since he has it already at home 4. Encourage improved lifestyle - low carb/cholesterol, reduce portion size, continue improving regular exercise 5. Follow-up 6 months - lipids q 12 mo      Relevant Medications   aspirin EC 81 MG tablet    Other Visit Diagnoses    Needs flu shot       Relevant Orders   Flu vaccine HIGH DOSE PF (Completed)      Meds ordered this encounter  Medications  . Multiple Vitamin (MULTIVITAMIN) tablet    Sig: Take 1 tablet by mouth daily.  . fexofenadine (ALLEGRA) 180 MG tablet    Sig: Take 180 mg by mouth daily.  Marland Kitchen aspirin EC 81 MG tablet    Sig: Take 1 tablet (81 mg total) by mouth daily.  Marland Kitchen acetaminophen (TYLENOL) 500 MG tablet    Sig: Take 1 tablet (500 mg total) by mouth every 6 (six) hours as needed.    Dispense:  30 tablet    Refill:  0  . Red Yeast Rice 600 MG CAPS    Sig: Take 1 daily    Follow up plan: Return in about 6 months (around 10/18/2017) for Pre-Diabetes A1c, Arthritis.  Nobie Putnam, [facility] 04/20/2017, 3:53 PM

## 2017-04-20 NOTE — Assessment & Plan Note (Signed)
Significantly improved on last CBC, Hgb 11 to 13 Asymptomatic Reassuring, follow-up as planned, UTD on colonoscopy screening

## 2017-04-21 ENCOUNTER — Encounter: Payer: 59 | Admitting: Family Medicine

## 2017-05-22 ENCOUNTER — Telehealth: Payer: Self-pay | Admitting: Family Medicine

## 2017-05-22 NOTE — Telephone Encounter (Signed)
Pt needs a refill on flonase sent to [facility] in [place]

## 2017-05-23 MED ORDER — FLUTICASONE PROPIONATE 50 MCG/ACT NA SUSP: NASAL | 6 refills | Status: DC

## 2017-05-24 ENCOUNTER — Encounter (INDEPENDENT_AMBULATORY_CARE_PROVIDER_SITE_OTHER): Payer: Self-pay

## 2017-07-18 ENCOUNTER — Other Ambulatory Visit: Payer: Self-pay | Admitting: Family Medicine

## 2017-07-18 DIAGNOSIS — M159 Polyosteoarthritis, unspecified: Secondary | ICD-10-CM

## 2017-07-18 DIAGNOSIS — M069 Rheumatoid arthritis, unspecified: Secondary | ICD-10-CM

## 2017-07-18 DIAGNOSIS — M150 Primary generalized (osteo)arthritis: Secondary | ICD-10-CM

## 2017-10-18 ENCOUNTER — Other Ambulatory Visit: Payer: Self-pay | Admitting: Family Medicine

## 2017-10-18 ENCOUNTER — Encounter: Payer: Self-pay | Admitting: Family Medicine

## 2017-10-18 ENCOUNTER — Ambulatory Visit (INDEPENDENT_AMBULATORY_CARE_PROVIDER_SITE_OTHER): Payer: Medicare Other | Admitting: Family Medicine

## 2017-10-18 VITALS — BP 122/66 | HR 60 | Temp 98.1°F | Resp 16 | Ht 67.5 in | Wt 168.0 lb

## 2017-10-18 DIAGNOSIS — M150 Primary generalized (osteo)arthritis: Secondary | ICD-10-CM

## 2017-10-18 DIAGNOSIS — M069 Rheumatoid arthritis, unspecified: Secondary | ICD-10-CM

## 2017-10-18 DIAGNOSIS — R7303 Prediabetes: Secondary | ICD-10-CM

## 2017-10-18 DIAGNOSIS — E7800 Pure hypercholesterolemia, unspecified: Secondary | ICD-10-CM

## 2017-10-18 DIAGNOSIS — M159 Polyosteoarthritis, unspecified: Secondary | ICD-10-CM

## 2017-10-18 DIAGNOSIS — L723 Sebaceous cyst: Secondary | ICD-10-CM | POA: Diagnosis not present

## 2017-10-18 DIAGNOSIS — D649 Anemia, unspecified: Secondary | ICD-10-CM

## 2017-10-18 DIAGNOSIS — Z0000 Encounter for general adult medical examination without abnormal findings: Secondary | ICD-10-CM

## 2017-10-18 DIAGNOSIS — Z8546 Personal history of malignant neoplasm of prostate: Secondary | ICD-10-CM

## 2017-10-18 LAB — POCT GLYCOSYLATED HEMOGLOBIN (HGB A1C): Hemoglobin A1C: 6.1 — AB (ref ?–5.7)

## 2017-10-18 NOTE — Progress Notes (Signed)
Subjective:    Patient ID: Keith Bates, male    DOB: Jul 07, 1941, 77 y.o.   MRN: [id number]  Keith Bates is a 77 y.o. male presenting on 10/18/2017 for Arthritis and prediabetes   HPI   Pre-Diabetes: Reports no concerns, last A1c elevated at 6.2 - now today 6.1 CBGs: Not checking Meds: Never on meds Not on ACEi/ARB Lifestyle: - Diet (Admits improving diet overall, wife is helping with his diet) - Exercise (he plans to improve regular exercise and walking, still doing yardwork) Denies hypoglycemia, polyuria, visual changes, numbness or tingling.  Osteoarthritis Multiple joints / history of RA vs OA Right Wrist vs Possible Carpal Tunnel - Last visit with me 04/20/17, for same problem, treated with continued conservative care, more Tylenol in addition to Meloxicam NSAID PRN, initial Diclofenac topical was not covered, see prior notes for background information. - Today patient reports relatively stable, with some mild worsening at times in Right hand wrist with pain and swelling, has flare ups, if using more or more active pushing lawn mower or other repetitive yardwork will have problem - He is taking Meloxicam 7.5mg  most days now with some relief, unsure if he should be taking it that often - Not taking Tylenol at this time, not regularly - Worse with weather changes cold or stormy or increased repetitive activity, pushing lawnmower and other - Other joints with pain aching and stiffness at times, if more active, including knees, but he can manage this, without issue - He wears tight fitting copper gloves to help with compression on his hands, but not wearing a wrist splint or other support - He has other injury to R temple, in past, has had some thick skin in this area with itching, he had prior biopsy that was negative  Additional updates: - 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 Not seen general surgery. Non tender, not red or draining currently  Depression screen [facility] 2/9 10/18/2017 01/12/2017  Decreased Interest 0 0  Down, Depressed, Hopeless 0 0  PHQ - 2 Score 0 0    Social History   Tobacco Use  . Smoking status: Former Smoker    Packs/day: 1.50    Years: 10.00    Pack years: 15.00    Types: Cigarettes    Last attempt to quit: 06/06/1972    Years since quitting: 45.3  . Smokeless tobacco: Former Network engineer Use Topics  . Alcohol use: No  . Drug use: No    Review of Systems Per HPI unless specifically indicated above     Objective:    BP 122/66   Pulse 60   Temp 98.1 F (36.7 C) (Oral)   Resp 16   Ht 5' 7.5" (1.715 m)   Wt 168 lb (76.2 kg)   BMI 25.92 kg/m   Wt Readings from Last 3 Encounters:  10/18/17 168 lb (76.2 kg)  04/20/17 161 lb (73 kg)  01/12/17 164 lb (74.4 kg)    Physical Exam  Constitutional: He is oriented to person, place, and time. He appears  well-developed and well-nourished. No distress.  Well-appearing, comfortable, cooperative, pleasant  HENT:  Head: Normocephalic and atraumatic.  Mouth/Throat: Oropharynx is clear and moist.  Eyes: Conjunctivae are normal. Right eye exhibits no discharge. Left eye exhibits no discharge.  Neck: Normal range of motion. Neck supple.  Cardiovascular: Normal rate, regular rhythm, normal heart sounds and intact distal pulses.  No murmur heard. Pulmonary/Chest: Effort normal.    Musculoskeletal: Normal range of motion. He exhibits no edema or tenderness.  Right Hand/Wrist Inspection: stable unchanged significant bulky appearance R wrist, asymmetrical, some slightly  bulky MCP joints, with mild soft tissue edema without erythema. Palpation: Non tender hand / wrist, carpal bones, including MCP, base of thumb. No distinct anatomical snuff box or scaphoid tenderness. Strength: 5/5 grip, thumb opposition, wrist flex/ext Neurovascular: distally intact  Lymphadenopathy:    He has no cervical adenopathy.  Neurological: He is alert and oriented to person, place, and time.  Distal sensation to light touch intact  Skin: Skin is warm and dry. No rash noted. He is not diaphoretic. No erythema.  Lower Back - midline approx 1 cm firm palpable nodule consistent with cyst, without fluctuance, non tender, no ulceration or opening or drainage, no erythema  Psychiatric: He has a normal mood and affect. His behavior is normal.  Well groomed, good eye contact, normal speech and thoughts  Nursing note and vitals reviewed.     Mid back upper lumbar midline cyst      Recent Labs    04/17/17 0844 10/18/17 1136  HGBA1C 6.2* 6.1*    Results for orders placed or performed in visit on 10/18/17  POCT HgB A1C  Result Value Ref Range   Hemoglobin A1C 6.1 (A) 5.7      Assessment & Plan:   Problem List Items Addressed This Visit    Pre-diabetes - Primary    Controlled Pre-DM with A1c 6.1 from 6.2  Plan:  1. Not on any therapy currently  2. Encourage improved lifestyle - low carb, low sugar diet, reduce portion size, continue improving regular exercise 3. Follow-up 6 months - Pre DM A1c w/ annual phys      Relevant Orders   POCT HgB A1C (Completed)   Primary osteoarthritis involving multiple joints    Stable with intermittent joint pain flares Recent flare up with more yardwork and repetitive stress R hand/wrist, some paresthesias concern possible underlying Carpal Tunnel Syndrome (has not had NCS or specialist eval, no prior injection) - No recent imaging  Plan: 1. Again advised to increase regular dosing of Tylenol Ext Str 500-1000mg  TID 2. May continue  oral NSAID Meloxicam 7.5mg  PRN only - limit use - he was using regularly, need to reduce frequency 3. Re-consider topical NSAID diclofenac at future visit if needed 4. RICE therapy - Handout and advised need to get Wrist Splint to avoid excess flexion / repetitive stress during work and overnight 5. 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     Follow up plan: Return in about 6 months (around 04/20/2018) for Annual Physical.  Future labs ordered for 04/13/18  Nobie Putnam, [facility] 10/18/2017, 12:54 PM

## 2017-10-18 NOTE — Assessment & Plan Note (Signed)
Controlled Pre-DM with A1c 6.1 from 6.2  Plan:  1. Not on any therapy currently  2. Encourage improved lifestyle - low carb, low sugar diet, reduce portion size, continue improving regular exercise 3. Follow-up 6 months - Pre DM A1c w/ annual phys

## 2017-10-18 NOTE — Assessment & Plan Note (Signed)
Suspected RA based on history vs likely mixed OA/DJD, with repetitive stress Seems persistent R wrist problem, mixed results on therapy - See A&P for OA

## 2017-10-18 NOTE — Assessment & Plan Note (Signed)
Chronic stable problem, midline lumbar spine Without secondary infection at this time, no drainage Asymptomatic Follow-up if worsening or changes, or request removal - will provide referral to General Surgery

## 2017-10-18 NOTE — Assessment & Plan Note (Signed)
Stable with intermittent joint pain flares Recent flare up with more yardwork and repetitive stress R hand/wrist, some paresthesias concern possible underlying Carpal Tunnel Syndrome (has not had NCS or specialist eval, no prior injection) - No recent imaging  Plan: 1. Again advised to increase regular dosing of Tylenol Ext Str 500-1000mg  TID 2. May continue oral NSAID Meloxicam 7.5mg  PRN only - limit use - he was using regularly, need to reduce frequency 3. Re-consider topical NSAID diclofenac at future visit if needed 4. RICE therapy - Handout and advised need to get Wrist Splint to avoid excess flexion / repetitive stress during work and overnight 5. Follow-up in future if flares, can consider X-ray - can refer to Ortho vs Neuro if need, future may need carpal tunnel inj

## 2017-10-18 NOTE — Patient Instructions (Addendum)
Thank you for coming to the office today.  1.  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 This a problem of compression on the nerve entering the hand at the wrist. Often it is caused by long history of overuse or repetitive activities that put strain on the nerve within wrist. Occasionally this can be caused by swelling or weight gain and pressure on this nerve as well.  Additionally you have arthritis and swelling of this hand  Wear a wrist splint at night ever - Purchase a WRIST SPLINT (to limit flexion of wrist) -WEAR IT DURING WORK and OVERNIGHT TO SLEEP - REST is extremely important, the goal is to AVOID re-injury, try to modify activities - Also may try ice packs if swelling, or topical icy hot muscle rub can also help ease worsening pain temporarily  Recommend trial of Anti-inflammatory with Meloxicam 7.5mg  - take one with food and plenty of water once daily every day (breakfast), for 1 to 2 weeks at a time, then then STOP and use it only as needed - DO NOT TAKE any advil, ibuprofen, aleve, motrin while you are taking this medicine - It is safe to take Tylenol Ext Str 500mg  tabs - take 1 to 2 (max dose 1000mg ) every 6 hours as needed for breakthrough pain, max 24 hour daily dose is 6 to 8 tablets or 4000mg   Recommend to start taking Tylenol Extra Strength 500mg  tabs - take 1 to 2 tabs per dose (max 1000mg ) every 6-8 hours for pain (take regularly, don't skip a dose for next 7 days), max 24 hour daily dose is 6 tablets or 3000mg . In the future you can repeat the same everyday Tylenol course for 1-2 weeks at a time.   I do not do any injection in hand or wrist - I would refer you to Orthopedics or Neurology  ---------------- Sebaceous cyst of back - if interested to have it removed will need to see a Education officer, environmental, we can refer in future, or your other provider can if you want to discuss with them in [place]  a mammogram repeat on Right breast tissue in the future, we can place order when ready or at your next Annual Physical   DUE for FASTING BLOOD WORK (no food or drink after midnight before the lab appointment, only water or coffee without cream/sugar on the morning of)  SCHEDULE "Lab Only" visit in the morning at the clinic for lab draw in 6 MONTHS   - Make sure Lab Only appointment is at about 1 week before your next appointment, so that results will be available  For Lab Results, once available within 2-3 days of blood draw, you can can log in to MyChart online to view your results and a brief explanation. Also, we can discuss results at next follow-up visit.   Please schedule a Follow-up Appointment to: Return in about 6 months (around 04/20/2018) for Annual Physical.  If you have any other questions or concerns, please feel free to call the office or send a message through [place]. You may also schedule an earlier appointment if necessary.  Additionally, you may be receiving a survey about your experience at our office within a few days to 1 week by e-mail or mail. We value your feedback.  Nobie Putnam, DO [place]

## 2018-02-25 ENCOUNTER — Other Ambulatory Visit: Payer: Self-pay | Admitting: Family Medicine

## 2018-04-13 ENCOUNTER — Other Ambulatory Visit: Payer: Medicare Other

## 2018-04-13 DIAGNOSIS — D649 Anemia, unspecified: Secondary | ICD-10-CM

## 2018-04-13 DIAGNOSIS — Z0000 Encounter for general adult medical examination without abnormal findings: Secondary | ICD-10-CM

## 2018-04-13 DIAGNOSIS — E7800 Pure hypercholesterolemia, unspecified: Secondary | ICD-10-CM

## 2018-04-13 DIAGNOSIS — M069 Rheumatoid arthritis, unspecified: Secondary | ICD-10-CM

## 2018-04-13 DIAGNOSIS — R7303 Prediabetes: Secondary | ICD-10-CM

## 2018-04-14 LAB — CBC WITH DIFFERENTIAL/PLATELET
BASOS PCT: 1 %
Basophils Absolute: 52 cells/uL (ref 0–200)
Eosinophils Absolute: 120 cells/uL (ref 15–500)
Eosinophils Relative: 2.3 %
HCT: 40 % (ref 38.5–50.0)
Hemoglobin: 13.2 g/dL (ref 13.2–17.1)
Lymphs Abs: 1955 cells/uL (ref 850–3900)
MCH: 29.9 pg (ref 27.0–33.0)
MCHC: 33 g/dL (ref 32.0–36.0)
MCV: 90.5 fL (ref 80.0–100.0)
MONOS PCT: 12 %
MPV: 9.9 fL (ref 7.5–12.5)
NEUTROS ABS: 2449 {cells}/uL (ref 1500–7800)
Neutrophils Relative %: 47.1 %
PLATELETS: 190 10*3/uL (ref 140–400)
RBC: 4.42 10*6/uL (ref 4.20–5.80)
RDW: 13.6 % (ref 11.0–15.0)
TOTAL LYMPHOCYTE: 37.6 %
WBC mixed population: 624 cells/uL (ref 200–950)
WBC: 5.2 10*3/uL (ref 3.8–10.8)

## 2018-04-14 LAB — COMPLETE METABOLIC PANEL WITH GFR
AG Ratio: 1.7 (calc) (ref 1.0–2.5)
ALT: 16 U/L (ref 9–46)
AST: 19 U/L (ref 10–35)
Albumin: 4 g/dL (ref 3.6–5.1)
Alkaline phosphatase (APISO): 72 U/L (ref 40–115)
BUN: 14 mg/dL (ref 7–25)
CALCIUM: 9.4 mg/dL (ref 8.6–10.3)
CO2: 27 mmol/L (ref 20–32)
CREATININE: 0.83 mg/dL (ref 0.70–1.18)
Chloride: 104 mmol/L (ref 98–110)
GFR, Est African American: 99 mL/min/{1.73_m2} (ref >=60)
GFR, Est Non African American: 85 mL/min/{1.73_m2} (ref >=60)
GLOBULIN: 2.3 g/dL (ref 1.9–3.7)
Glucose, Bld: 93 mg/dL (ref 65–99)
Potassium: 4 mmol/L (ref 3.5–5.3)
SODIUM: 139 mmol/L (ref 135–146)
TOTAL PROTEIN: 6.3 g/dL (ref 6.1–8.1)
Total Bilirubin: 0.9 mg/dL (ref 0.2–1.2)

## 2018-04-14 LAB — HEMOGLOBIN A1C
Hgb A1c MFr Bld: 6.2 % of total Hgb — ABNORMAL HIGH (ref <5.7)
MEAN PLASMA GLUCOSE: 131 (calc)
eAG (mmol/L): 7.3 (calc)

## 2018-04-14 LAB — LIPID PANEL
CHOL/HDL RATIO: 3.7 (calc) (ref <5.0)
CHOLESTEROL: 211 mg/dL — AB (ref <200)
HDL: 57 mg/dL (ref >40)
LDL Cholesterol (Calc): 138 mg/dL (calc) — ABNORMAL HIGH
NON-HDL CHOLESTEROL (CALC): 154 mg/dL — AB (ref <130)
Triglycerides: 66 mg/dL (ref <150)

## 2018-04-20 ENCOUNTER — Ambulatory Visit (INDEPENDENT_AMBULATORY_CARE_PROVIDER_SITE_OTHER): Payer: Medicare Other | Admitting: Family Medicine

## 2018-04-20 ENCOUNTER — Encounter: Payer: Self-pay | Admitting: Family Medicine

## 2018-04-20 VITALS — BP 133/85 | HR 56 | Temp 98.4°F | Resp 16 | Ht 67.5 in | Wt 154.0 lb

## 2018-04-20 DIAGNOSIS — K648 Other hemorrhoids: Secondary | ICD-10-CM

## 2018-04-20 DIAGNOSIS — E7800 Pure hypercholesterolemia, unspecified: Secondary | ICD-10-CM | POA: Diagnosis not present

## 2018-04-20 DIAGNOSIS — Z0000 Encounter for general adult medical examination without abnormal findings: Secondary | ICD-10-CM | POA: Diagnosis not present

## 2018-04-20 DIAGNOSIS — M069 Rheumatoid arthritis, unspecified: Secondary | ICD-10-CM | POA: Diagnosis not present

## 2018-04-20 DIAGNOSIS — M150 Primary generalized (osteo)arthritis: Secondary | ICD-10-CM

## 2018-04-20 DIAGNOSIS — L720 Epidermal cyst: Secondary | ICD-10-CM

## 2018-04-20 DIAGNOSIS — R7303 Prediabetes: Secondary | ICD-10-CM

## 2018-04-20 DIAGNOSIS — M159 Polyosteoarthritis, unspecified: Secondary | ICD-10-CM

## 2018-04-20 MED ORDER — MELOXICAM 7.5 MG PO TABS: 7.5000 mg | ORAL_TABLET | Freq: Every day | ORAL | 2 refills | Status: DC | PRN

## 2018-04-20 NOTE — Patient Instructions (Addendum)
Thank you for coming to the office today.  Please schedule and return for a NURSE ONLY VISIT for VACCINE - Approximately around end of September / early October 2019 - Need High Dose Flu Vaccine  Refilled the Meloxicam 7.5 for anti inflammatory and pain use it when needed only.  May take Tylenol ext str as discussed as well  Restart Red Yeast Rice as well to help control cholesterol.  For Constipation (less frequent bowel movement that can be hard dry or involve straining).  Recommend trying OTC Miralax 17g = 1 capful in large glass water once daily for now, try several days to see if working, goal is soft stool or BM 1-2 times daily, if too loose then reduce dose or try every other day. If not effective may need to increase it to 2 doses at once in AM or may do 1 in morning and 1 in afternoon/evening  - This medicine is very safe and can be used often without any problem and will not make you dehydrated. It is good for use on AS NEEDED BASIS or even MAINTENANCE therapy for longer term for several days to weeks at a time to help regulate bowel movements  Other more natural remedies or preventative treatment: - Increase hydration with water - Increase fiber in diet (high fiber foods = vegetables, leafy greens, oats/grains) - May take OTC Fiber supplement (metamucil powder or pill/gummy) - May try OTC Probiotic  For cyst on back  Stay tuned for appointment.  [facility] [address] [place], [place]  [address] Hours: 8:30am to 5pm (M-F) Ph [phone]  Please schedule a Follow-up Appointment to: Return in about 6 months (around 10/19/2018) for PreDM A1c, Arthritis.  If you have any other questions or concerns, please feel free to call the office or send a message through [place]. You may also schedule an earlier appointment if necessary.  Additionally, you may be receiving a survey about your experience at our office within a few days to 1 week by e-mail or  mail. We value your feedback.  Nobie Putnam, DO [place]

## 2018-04-20 NOTE — Progress Notes (Signed)
Subjective:    Patient ID: Keith Bates, male    DOB: 01-20-1941, 77 y.o.   MRN: [id number]  Keith Bates is a 77 y.o. male presenting on 04/20/2018 for Annual Exam   HPI   Here for Annual Physical and Lab Review.  Pre-Diabetes: Reports no concerns, stable A1c 6.2 CBGs:Not checking Meds:Never on meds Not on ACEi/ARB Lifestyle: - Diet (Admits still focusing on healthy diet, low carb, inc fruit) - Exercise (improve regular exercise and walking, doing yardwork outside) Denies hypoglycemia  Hemorrhoids / Constipation Reports he has issue with bowel movements due to hemorrhoid flare some times, and causes inc constipation. Tried some stool softener without good results.  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 - Today doing well. He is due for refill on Meloxicam .5mg  PRN, not taking regularly. - Also takes Tylenol, uses copper compression gloves - Worse with weather changes cold or stormy or increased repetitive activity, pushing lawnmower and other - Other joints with pain aching and stiffness at times, if more active, including knees  Cyst on Back Reports midline low back with "bump" or cyst that continues to bother him, sometimes it itches and he has scratched it, thought it may drain but it has not. He is asking about removal.  HYPERLIPIDEMIA: - Last lipid panel 04/2018, mild elevated LDL - Previously taking Red Yeast Rice supplement - Not on statin  Additional history today: Admits history of accidental fall outside, he was helping friend work on Publishing copy and he tripped and fell back after using some force and slipped. He stated he hit his head on truck and had a bruise on back of head some soreness but it healed.   Health Maintenance: Will return for high dose flu vaccine when available.  Depression screen [facility] 2/9 04/20/2018  10/18/2017 01/12/2017  Decreased Interest 0 0 0  Down, Depressed, Hopeless 0 0 0  PHQ - 2 Score 0 0 0    Past Medical History:  Diagnosis Date  . Arthritis   . DDD (degenerative disc disease)   . Enlarged prostate   . Environmental allergies   . GERD (gastroesophageal reflux disease)   . Hx of ulcer disease   . Loss of weight   . Nocturia   . Prostate cancer ([place])   . Unspecified hemorrhoids without mention of complication    Past Surgical History:  Procedure Laterality Date  . COLONOSCOPY  03/13/2013  . HEMORRHOID BANDING    . LYMPHADENECTOMY Bilateral 06/21/2013   Procedure: LYMPHADENECTOMY  " BILATERAL PELVIC LYMPH NODE DISSECTION ;  Surgeon: Alexis Frock, MD;  Location: [facility];  Service: Urology;  Laterality: Bilateral;  . ROBOT ASSISTED LAPAROSCOPIC RADICAL PROSTATECTOMY N/A 06/21/2013   Procedure: ROBOTIC ASSISTED LAPAROSCOPIC RADICAL PROSTATECTOMY;  Surgeon: Alexis Frock, MD;  Location: [facility];  Service: Urology;  Laterality: N/A;   Social History   Socioeconomic History  . Marital status: Married    Spouse name: Not on file  . Number of children: Not on file  . Years of education: Not on file  . Highest education level: Not on file  Occupational History  . Not on file  Social Needs  . Financial resource strain: Not on file  . Food insecurity:    Worry: Not on file    Inability: Not on file  . Transportation needs:    Medical: Not on file  Non-medical: Not on file  Tobacco Use  . Smoking status: Former Smoker    Packs/day: 1.50    Years: 10.00    Pack years: 15.00    Types: Cigarettes    Last attempt to quit: 06/06/1972    Years since quitting: 45.9  . Smokeless tobacco: Former Network engineer and Sexual Activity  . Alcohol use: No  . Drug use: No  . Sexual activity: Not Currently  Lifestyle  . Physical activity:    Days per week: Not on file    Minutes per session: Not on file  . Stress: Not on file  Relationships  . Social connections:     Talks on phone: Not on file    Gets together: Not on file    Attends religious service: Not on file    Active member of club or organization: Not on file    Attends meetings of clubs or organizations: Not on file    Relationship status: Not on file  . Intimate partner violence:    Fear of current or ex partner: Not on file    Emotionally abused: Not on file    Physically abused: Not on file    Forced sexual activity: Not on file  Other Topics Concern  . Not on file  Social History Narrative  . Not on file   Family History  Problem Relation Age of Onset  . Prostate cancer Father        with mets to nodes  . Cancer Brother        colon and prostate  . Heart attack Mother    Current Outpatient Medications on File Prior to Visit  Medication Sig  . acetaminophen (TYLENOL) 500 MG tablet Take 1 tablet (500 mg total) by mouth every 6 (six) hours as needed.  Marland Kitchen aspirin EC 81 MG tablet Take 1 tablet (81 mg total) by mouth daily.  . fexofenadine (ALLEGRA) 180 MG tablet Take 180 mg by mouth daily.  . fluticasone (FLONASE) 50 MCG/ACT nasal spray SPRAY 2 SPRAYS INTO EACH NOSTRIL EVERY DAY  . Multiple Vitamin (MULTIVITAMIN) tablet Take 1 tablet by mouth daily.  . Red Yeast Rice 600 MG CAPS Take 1 daily   No current facility-administered medications on file prior to visit.     Review of Systems  Constitutional: Negative for activity change, appetite change, chills, diaphoresis, fatigue and fever.  HENT: Negative for congestion and hearing loss.   Eyes: Negative for visual disturbance.  Respiratory: Negative for apnea, cough, chest tightness, shortness of breath and wheezing.   Cardiovascular: Negative for chest pain, palpitations and leg swelling.  Gastrointestinal: Negative for abdominal pain, anal bleeding, blood in stool, constipation, diarrhea, nausea and vomiting.  Endocrine: Negative for cold intolerance.  Genitourinary: Negative for difficulty urinating, dysuria, frequency and  hematuria.  Musculoskeletal: Positive for arthralgias. Negative for back pain and neck pain.  Skin: Negative for rash.  Allergic/Immunologic: Negative for environmental allergies.  Neurological: Negative for dizziness, weakness, light-headedness, numbness and headaches.  Hematological: Negative for adenopathy.  Psychiatric/Behavioral: Negative for behavioral problems, dysphoric mood and sleep disturbance. The patient is not nervous/anxious.    Per HPI unless specifically indicated above     Objective:    BP 133/85   Pulse (!) 56   Temp 98.4 F (36.9 C) (Oral)   Resp 16   Ht 5' 7.5" (1.715 m)   Wt 154 lb (69.9 kg)   BMI 23.76 kg/m   Wt Readings from Last 3 Encounters:  04/20/18 154 lb (69.9 kg)  10/18/17 168 lb (76.2 kg)  04/20/17 161 lb (73 kg)    Physical Exam  Constitutional: He is oriented to person, place, and time. He appears well-developed and well-nourished. No distress.  Well-appearing, comfortable, cooperative  HENT:  Head: Normocephalic and atraumatic.  Mouth/Throat: Oropharynx is clear and moist.  Frontal / maxillary sinuses non-tender. Nares patent without purulence or edema. Bilateral TMs clear without erythema, effusion or bulging. Oropharynx clear without erythema, exudates, edema or asymmetry.  Eyes: Pupils are equal, round, and reactive to light. Conjunctivae and EOM are normal. Right eye exhibits no discharge. Left eye exhibits no discharge.  Neck: Normal range of motion. Neck supple. No thyromegaly present.  Cardiovascular: Normal rate, regular rhythm, normal heart sounds and intact distal pulses.  No murmur heard. Pulmonary/Chest: Effort normal and breath sounds normal. No respiratory distress. He has no wheezes. He has no rales.  Abdominal: Soft. Bowel sounds are normal. He exhibits no distension and no mass. There is no tenderness.  Musculoskeletal: Normal range of motion. He exhibits no edema or tenderness.  Upper / Lower Extremities: - Normal muscle  tone, strength bilateral upper extremities 5/5, lower extremities 5/5  Lymphadenopathy:    He has no cervical adenopathy.  Neurological: He is alert and oriented to person, place, and time.  Distal sensation intact to light touch all extremities  Skin: Skin is warm and dry. No rash noted. He is not diaphoretic. No erythema.  Low Back - Midline 1.5 to 2 cm subcutaneous mobile non tender epidermal cyst, no drainage or ulceration.  Psychiatric: He has a normal mood and affect. His behavior is normal.  Well groomed, good eye contact, normal speech and thoughts  Nursing note and vitals reviewed.    Results for orders placed or performed in visit on 04/13/18  Lipid panel  Result Value Ref Range   Cholesterol 211 (H) <200 mg/dL   HDL 57 >40 mg/dL   Triglycerides 66 <150 mg/dL   LDL Cholesterol (Calc) 138 (H) mg/dL (calc)   Total CHOL/HDL Ratio 3.7 <5.0 (calc)   Non-HDL Cholesterol (Calc) 154 (H) <130 mg/dL (calc)  COMPLETE METABOLIC PANEL WITH GFR  Result Value Ref Range   Glucose, Bld 93 65 - 99 mg/dL   BUN 14 7 - 25 mg/dL   Creat 0.83 0.70 - 1.18 mg/dL   GFR, Est Non African American 85 > OR = 60 mL/min/1.20m2   GFR, Est African American 99 > OR = 60 mL/min/1.32m2   BUN/Creatinine Ratio NOT APPLICABLE 6 - 22 (calc)   Sodium 139 135 - 146 mmol/L   Potassium 4.0 3.5 - 5.3 mmol/L   Chloride 104 98 - 110 mmol/L   CO2 27 20 - 32 mmol/L   Calcium 9.4 8.6 - 10.3 mg/dL   Total Protein 6.3 6.1 - 8.1 g/dL   Albumin 4.0 3.6 - 5.1 g/dL   Globulin 2.3 1.9 - 3.7 g/dL (calc)   AG Ratio 1.7 1.0 - 2.5 (calc)   Total Bilirubin 0.9 0.2 - 1.2 mg/dL   Alkaline phosphatase (APISO) 72 40 - 115 U/L   AST 19 10 - 35 U/L   ALT 16 9 - 46 U/L  CBC with Differential/Platelet  Result Value Ref Range   WBC 5.2 3.8 - 10.8 Thousand/uL   RBC 4.42 4.20 - 5.80 Million/uL   Hemoglobin 13.2 13.2 - 17.1 g/dL   HCT 40.0 38.5 - 50.0 %   MCV 90.5 80.0 - 100.0 fL   MCH 29.9  27.0 - 33.0 pg   MCHC 33.0 32.0 - 36.0  g/dL   RDW 13.6 11.0 - 15.0 %   Platelets 190 140 - 400 Thousand/uL   MPV 9.9 7.5 - 12.5 fL   Neutro Abs 2,449 1,500 - 7,800 cells/uL   Lymphs Abs 1,955 850 - 3,900 cells/uL   WBC mixed population 624 200 - 950 cells/uL   Eosinophils Absolute 120 15 - 500 cells/uL   Basophils Absolute 52 0 - 200 cells/uL   Neutrophils Relative % 47.1 %   Total Lymphocyte 37.6 %   Monocytes Relative 12.0 %   Eosinophils Relative 2.3 %   Basophils Relative 1.0 %  Hemoglobin A1c  Result Value Ref Range   Hgb A1c MFr Bld 6.2 (H) <5.7 % of total Hgb   Mean Plasma Glucose 131 (calc)   eAG (mmol/L) 7.3 (calc)      Assessment & Plan:   Problem List Items Addressed This Visit    Hyperlipidemia    Controlled cholesterol on lifestyle Last lipid panel 04/2018 Calculated ASCVD 10 yr risk score 13%, near baseline 10%  Plan: 1. Discussed ASCVD risk reduction strategies - decline statin at this time 2. Continue ASA 81mg  for primary ASCVD risk reduction 3. Restart  OTC supplement Red Rice Yeast 4. Encourage improved lifestyle - low carb/cholesterol, reduce portion size, continue improving regular exercise      Internal hemorrhoids    Stable, without active flare Causing constipation - Advised miralax and tips per AVS      Pre-diabetes    Controlled Pre-DM with A1c 6.2 Without hyperglycemia  Plan:  1. Not on any therapy currently  2. Encourage improved lifestyle - low carb, low sugar diet, reduce portion size, continue improving regular exercise - handout given on glycemic content 3. Follow-up 6 months - Pre DM A1c      Primary osteoarthritis involving multiple joints    Stable with intermittent joint pain flares  Plan: 1. Continue regular dosing of Tylenol Ext Str 500-1000mg  TID 2. May continue oral NSAID Meloxicam 7.5mg  PRN only - refill today 3. Re-consider topical NSAID diclofenac at future visit if needed 4. RICE therapy 5. Follow-up in future if flares, can consider X-ray - can refer to  Ortho vs Neuro if need, future may need carpal tunnel inj      Relevant Medications   meloxicam (MOBIC) 7.5 MG tablet   Rheumatoid arthritis involving right wrist (HCC)    Stable without flare Refilled NSAID Meloxicam, use PRN      Relevant Medications   meloxicam (MOBIC) 7.5 MG tablet    Other Visit Diagnoses    Annual physical exam    -  Primary  Updated Health Maintenance information Reviewed recent lab results with patient Encouraged improvement to lifestyle with diet and exercise    Epidermal cyst     Uncomplicated, midline low back. No secondary infection Pt requested referral for excision    Relevant Orders   Ambulatory referral to General Surgery      Meds ordered this encounter  Medications  . meloxicam (MOBIC) 7.5 MG tablet    Sig: Take 1 tablet (7.5 mg total) by mouth daily as needed for pain. Up to 1-2 weeks as needed    Dispense:  30 tablet    Refill:  2   Orders Placed This Encounter  Procedures  . Ambulatory referral to General Surgery    Referral Priority:   Routine    Referral Type:   Surgical  Referral Reason:   Specialty Services Required    Requested Specialty:   General Surgery    Number of Visits Requested:   1    Follow up plan: Return in about 6 months (around 10/19/2018) for PreDM A1c, Arthritis.  Nobie Putnam, [facility] 04/20/2018, 10:32 AM

## 2018-04-21 NOTE — Assessment & Plan Note (Signed)
Stable, without active flare Causing constipation - Advised miralax and tips per AVS

## 2018-04-21 NOTE — Assessment & Plan Note (Signed)
Controlled Pre-DM with A1c 6.2 Without hyperglycemia  Plan:  1. Not on any therapy currently  2. Encourage improved lifestyle - low carb, low sugar diet, reduce portion size, continue improving regular exercise - handout given on glycemic content 3. Follow-up 6 months - Pre DM A1c

## 2018-04-21 NOTE — Assessment & Plan Note (Signed)
Stable with intermittent joint pain flares  Plan: 1. Continue regular dosing of Tylenol Ext Str 500-1000mg  TID 2. May continue oral NSAID Meloxicam 7.5mg  PRN only - refill today 3. Re-consider topical NSAID diclofenac at future visit if needed 4. RICE therapy 5. Follow-up in future if flares, can consider X-ray - can refer to Ortho vs Neuro if need, future may need carpal tunnel inj

## 2018-04-21 NOTE — Assessment & Plan Note (Signed)
Controlled cholesterol on lifestyle Last lipid panel 04/2018 Calculated ASCVD 10 yr risk score 13%, near baseline 10%  Plan: 1. Discussed ASCVD risk reduction strategies - decline statin at this time 2. Continue ASA 81mg  for primary ASCVD risk reduction 3. Restart  OTC supplement Red Rice Yeast 4. Encourage improved lifestyle - low carb/cholesterol, reduce portion size, continue improving regular exercise

## 2018-04-21 NOTE — Assessment & Plan Note (Signed)
Stable without flare Refilled NSAID Meloxicam, use PRN

## 2018-10-19 ENCOUNTER — Encounter: Payer: Self-pay | Admitting: Family Medicine

## 2018-10-19 ENCOUNTER — Other Ambulatory Visit: Payer: Self-pay | Admitting: Family Medicine

## 2018-10-19 ENCOUNTER — Ambulatory Visit (INDEPENDENT_AMBULATORY_CARE_PROVIDER_SITE_OTHER): Payer: Medicare Other | Admitting: Family Medicine

## 2018-10-19 ENCOUNTER — Other Ambulatory Visit: Payer: Self-pay

## 2018-10-19 VITALS — BP 136/81 | HR 84 | Temp 98.2°F | Resp 16 | Ht 67.5 in | Wt 162.0 lb

## 2018-10-19 DIAGNOSIS — M150 Primary generalized (osteo)arthritis: Secondary | ICD-10-CM | POA: Diagnosis not present

## 2018-10-19 DIAGNOSIS — M159 Polyosteoarthritis, unspecified: Secondary | ICD-10-CM

## 2018-10-19 DIAGNOSIS — M069 Rheumatoid arthritis, unspecified: Secondary | ICD-10-CM

## 2018-10-19 DIAGNOSIS — E7800 Pure hypercholesterolemia, unspecified: Secondary | ICD-10-CM

## 2018-10-19 DIAGNOSIS — Z0000 Encounter for general adult medical examination without abnormal findings: Secondary | ICD-10-CM

## 2018-10-19 DIAGNOSIS — R7303 Prediabetes: Secondary | ICD-10-CM

## 2018-10-19 LAB — POCT GLYCOSYLATED HEMOGLOBIN (HGB A1C): Hemoglobin A1C: 6.3 % — AB (ref 4.0–5.6)

## 2018-10-19 NOTE — Assessment & Plan Note (Signed)
Stable with intermittent joint pain flares  Plan: 1. Continue regular dosing of Tylenol Ext Str 500-1000mg  TID 2. May continue oral NSAID Meloxicam 7.5mg  PRN only - no refill needed today 3. Re-consider topical NSAID diclofenac at future visit if needed 4. RICE therapy 5. Follow-up in future if flares, can consider X-ray - can refer to Ortho vs Neuro if need, future may need carpal tunnel inj

## 2018-10-19 NOTE — Progress Notes (Signed)
Subjective:    Patient ID: Keith Bates, male    DOB: 1941-02-21, 78 y.o.   MRN: [id number]  Keith Bates is a 78 y.o. male presenting on 10/19/2018 for Arthritis and Pre-Diabetes   HPI   Pre-Diabetes: Reports no concerns. Last A1c 6.2. Due today. CBGs:Not checking Meds:Never on meds Not on ACEi/ARB Lifestyle: - Diet (Admits still healthy diet, low carb - but admits does eat some sweets recently) - Exercise (improveregular exercise and walking, now he will get to work more out side with warmer weather) Denies hypoglycemia, numbness tingling weakness  Osteoarthritis Multiple joints / history of RA vs OA RightWrist vs Possible Carpal Tunnel - Last visit with me 04/2018, for same problem, see prior notes for background information. - Today doing well. Still taking Meloxicam 7.5mg  PRN, not taking regularly. - Also takes Tylenol, uses copper compression gloves - Worse with increased repetitive activity and yardwork - Other joints with pain aching and stiffness at times, if more active, including knees  Left Lower Leg Muscle Cramps Reports recently if more active was getting some lower extremity cramping spells worse at night. Tried mustard with some relief, he uses sports drink for electrolytes occasionally.  Health Maintenance: Due for Flu Shot, declines today despite counseling on benefits   Depression screen [facility] 2/9 10/19/2018 04/20/2018 10/18/2017  Decreased Interest 0 0 0  Down, Depressed, Hopeless 0 0 0  PHQ - 2 Score 0 0 0    Social History   Tobacco Use  . Smoking status: Former Smoker    Packs/day: 1.50    Years: 10.00    Pack years: 15.00    Types: Cigarettes    Last attempt to quit: 06/06/1972    Years since quitting: 46.4  . Smokeless tobacco: Former Network engineer Use Topics  . Alcohol use: No  . Drug use: No    Review of Systems Per HPI unless specifically indicated above     Objective:    BP 136/81   Pulse 84   Temp 98.2 F (36.8 C) (Oral)    Resp 16   Ht 5' 7.5" (1.715 m)   Wt 162 lb (73.5 kg)   BMI 25.00 kg/m   Wt Readings from Last 3 Encounters:  10/19/18 162 lb (73.5 kg)  04/20/18 154 lb (69.9 kg)  10/18/17 168 lb (76.2 kg)    Physical Exam Vitals signs and nursing note reviewed.  Constitutional:      General: He is not in acute distress.    Appearance: He is well-developed. He is not diaphoretic.     Comments: Well-appearing, comfortable, cooperative  HENT:     Head: Normocephalic and atraumatic.  Eyes:     General:        Right eye: No discharge.        Left eye: No discharge.     Conjunctiva/sclera: Conjunctivae normal.  Neck:     Musculoskeletal: Normal range of motion and neck supple.     Thyroid: No thyromegaly.  Cardiovascular:     Rate and Rhythm: Normal rate and regular rhythm.     Heart sounds: Normal heart sounds. No murmur.  Pulmonary:     Effort: Pulmonary effort is normal. No respiratory distress.     Breath sounds: Normal breath sounds. No wheezing or rales.  Musculoskeletal: Normal range of motion.     Comments: BIlateral Hand/wrist - some bulky appearance of wrist L>R, has intact strength and sensation, some bulky MCP joints  Lymphadenopathy:  Cervical: No cervical adenopathy.  Skin:    General: Skin is warm and dry.     Findings: No erythema or rash.  Neurological:     Mental Status: He is alert and oriented to person, place, and time.  Psychiatric:        Behavior: Behavior normal.     Comments: Well groomed, good eye contact, normal speech and thoughts      Recent Labs    04/13/18 0817 10/19/18 0857  HGBA1C 6.2* 6.3*    Results for orders placed or performed in visit on 10/19/18  POCT HgB A1C  Result Value Ref Range   Hemoglobin A1C 6.3 (A) 4.0 - 5.6 %      Assessment & Plan:   Problem List Items Addressed This Visit    Pre-diabetes - Primary    Mild elevated A1c up to 6.3, in range of PreDM still - attributed to some poor diet/lifestyle  Plan:  1. Not on any  therapy currently  2. Encourage improved lifestyle - low carb, low sugar diet, reduce portion size, continue improving regular exercise as discussed 3. Follow-up 6 months - annual w/ labs A1c      Relevant Orders   POCT HgB A1C (Completed)   Primary osteoarthritis involving multiple joints    Stable with intermittent joint pain flares  Plan: 1. Continue regular dosing of Tylenol Ext Str 500-1000mg  TID 2. May continue oral NSAID Meloxicam 7.5mg  PRN only - no refill needed today 3. Re-consider topical NSAID diclofenac at future visit if needed 4. RICE therapy 5. Follow-up in future if flares, can consider X-ray - can refer to Ortho vs Neuro if need, future may need carpal tunnel inj      Rheumatoid arthritis involving right wrist (HCC)    Stable without flare Refilled NSAID Meloxicam, use PRN         No orders of the defined types were placed in this encounter.    Follow up plan: Return in about 6 months (around 04/21/2019) for Annual Physical.  Future labs ordered for 04/23/2019  Nobie Putnam, [facility] 10/19/2018, 8:51 AM

## 2018-10-19 NOTE — Assessment & Plan Note (Signed)
Mild elevated A1c up to 6.3, in range of PreDM still - attributed to some poor diet/lifestyle  Plan:  1. Not on any therapy currently  2. Encourage improved lifestyle - low carb, low sugar diet, reduce portion size, continue improving regular exercise as discussed 3. Follow-up 6 months - annual w/ labs A1c

## 2018-10-19 NOTE — Assessment & Plan Note (Signed)
Stable without flare Refilled NSAID Meloxicam, use PRN

## 2018-10-19 NOTE — Patient Instructions (Addendum)
Thank you for coming to the office today.  Recent Labs    04/13/18 0817 10/19/18 0857  HGBA1C 6.2* 6.3*   Try to reduce sugar and sweets in diet  Leg cramps - Try spoonful of yellow mustard to relieve leg cramps or try daily to prevent the problem  - OTC natural option is Hyland's Leg Cramps (Dissolving tablet) take as needed for muscle cramps  DUE for FASTING BLOOD WORK (no food or drink after midnight before the lab appointment, only water or coffee without cream/sugar on the morning of)  SCHEDULE "Lab Only" visit in the morning at the clinic for lab draw in 6 MONTHS   - Make sure Lab Only appointment is at about 1 week before your next appointment, so that results will be available  For Lab Results, once available within 2-3 days of blood draw, you can can log in to MyChart online to view your results and a brief explanation. Also, we can discuss results at next follow-up visit.   Please schedule a Follow-up Appointment to: Return in about 6 months (around 04/21/2019) for Annual Physical.  If you have any other questions or concerns, please feel free to call the office or send a message through [place]. You may also schedule an earlier appointment if necessary.  Additionally, you may be receiving a survey about your experience at our office within a few days to 1 week by e-mail or mail. We value your feedback.  Nobie Putnam, DO [place]

## 2019-01-09 ENCOUNTER — Other Ambulatory Visit: Payer: Self-pay | Admitting: Family Medicine

## 2019-01-09 DIAGNOSIS — J3089 Other allergic rhinitis: Secondary | ICD-10-CM

## 2019-04-23 ENCOUNTER — Other Ambulatory Visit: Payer: Self-pay

## 2019-04-23 ENCOUNTER — Other Ambulatory Visit: Payer: Medicare Other

## 2019-04-23 DIAGNOSIS — E7800 Pure hypercholesterolemia, unspecified: Secondary | ICD-10-CM

## 2019-04-23 DIAGNOSIS — M069 Rheumatoid arthritis, unspecified: Secondary | ICD-10-CM

## 2019-04-23 DIAGNOSIS — M159 Polyosteoarthritis, unspecified: Secondary | ICD-10-CM

## 2019-04-23 DIAGNOSIS — Z0000 Encounter for general adult medical examination without abnormal findings: Secondary | ICD-10-CM

## 2019-04-23 DIAGNOSIS — R7303 Prediabetes: Secondary | ICD-10-CM

## 2019-04-24 LAB — COMPLETE METABOLIC PANEL WITH GFR
AG Ratio: 1.6 (calc) (ref 1.0–2.5)
ALT: 16 U/L (ref 9–46)
AST: 17 U/L (ref 10–35)
Albumin: 3.9 g/dL (ref 3.6–5.1)
Alkaline phosphatase (APISO): 75 U/L (ref 35–144)
BUN: 13 mg/dL (ref 7–25)
CO2: 26 mmol/L (ref 20–32)
Calcium: 9.4 mg/dL (ref 8.6–10.3)
Chloride: 105 mmol/L (ref 98–110)
Creat: 0.83 mg/dL (ref 0.70–1.18)
GFR, Est African American: 98 mL/min/{1.73_m2} (ref >=60)
GFR, Est Non African American: 85 mL/min/{1.73_m2} (ref >=60)
Globulin: 2.5 g/dL (calc) (ref 1.9–3.7)
Glucose, Bld: 102 mg/dL — ABNORMAL HIGH (ref 65–99)
Potassium: 4.2 mmol/L (ref 3.5–5.3)
Sodium: 140 mmol/L (ref 135–146)
Total Bilirubin: 0.6 mg/dL (ref 0.2–1.2)
Total Protein: 6.4 g/dL (ref 6.1–8.1)

## 2019-04-24 LAB — CBC WITH DIFFERENTIAL/PLATELET
Absolute Monocytes: 708 cells/uL (ref 200–950)
Basophils Absolute: 49 cells/uL (ref 0–200)
Basophils Relative: 0.8 %
Eosinophils Absolute: 122 cells/uL (ref 15–500)
Eosinophils Relative: 2 %
HCT: 42 % (ref 38.5–50.0)
Hemoglobin: 13.9 g/dL (ref 13.2–17.1)
Lymphs Abs: 2550 cells/uL (ref 850–3900)
MCH: 30.1 pg (ref 27.0–33.0)
MCHC: 33.1 g/dL (ref 32.0–36.0)
MCV: 90.9 fL (ref 80.0–100.0)
MPV: 10.3 fL (ref 7.5–12.5)
Monocytes Relative: 11.6 %
Neutro Abs: 2672 cells/uL (ref 1500–7800)
Neutrophils Relative %: 43.8 %
Platelets: 200 10*3/uL (ref 140–400)
RBC: 4.62 10*6/uL (ref 4.20–5.80)
RDW: 13.1 % (ref 11.0–15.0)
Total Lymphocyte: 41.8 %
WBC: 6.1 10*3/uL (ref 3.8–10.8)

## 2019-04-24 LAB — LIPID PANEL
Cholesterol: 214 mg/dL — ABNORMAL HIGH (ref <200)
HDL: 62 mg/dL (ref >=40)
LDL Cholesterol (Calc): 133 mg/dL (calc) — ABNORMAL HIGH
Non-HDL Cholesterol (Calc): 152 mg/dL (calc) — ABNORMAL HIGH (ref <130)
Total CHOL/HDL Ratio: 3.5 (calc) (ref <5.0)
Triglycerides: 91 mg/dL (ref <150)

## 2019-04-24 LAB — HEMOGLOBIN A1C
Hgb A1c MFr Bld: 6.2 % of total Hgb — ABNORMAL HIGH (ref <5.7)
Mean Plasma Glucose: 131 (calc)
eAG (mmol/L): 7.3 (calc)

## 2019-04-30 ENCOUNTER — Other Ambulatory Visit: Payer: Self-pay

## 2019-04-30 ENCOUNTER — Other Ambulatory Visit: Payer: Self-pay | Admitting: Family Medicine

## 2019-04-30 ENCOUNTER — Encounter: Payer: Self-pay | Admitting: Family Medicine

## 2019-04-30 ENCOUNTER — Ambulatory Visit (INDEPENDENT_AMBULATORY_CARE_PROVIDER_SITE_OTHER): Payer: Medicare Other | Admitting: Family Medicine

## 2019-04-30 VITALS — BP 135/61 | HR 54 | Temp 98.7°F | Resp 14 | Ht 66.0 in | Wt 150.0 lb

## 2019-04-30 DIAGNOSIS — R7303 Prediabetes: Secondary | ICD-10-CM | POA: Diagnosis not present

## 2019-04-30 DIAGNOSIS — D649 Anemia, unspecified: Secondary | ICD-10-CM

## 2019-04-30 DIAGNOSIS — Z0000 Encounter for general adult medical examination without abnormal findings: Secondary | ICD-10-CM

## 2019-04-30 DIAGNOSIS — M150 Primary generalized (osteo)arthritis: Secondary | ICD-10-CM

## 2019-04-30 DIAGNOSIS — E7800 Pure hypercholesterolemia, unspecified: Secondary | ICD-10-CM

## 2019-04-30 DIAGNOSIS — M069 Rheumatoid arthritis, unspecified: Secondary | ICD-10-CM | POA: Diagnosis not present

## 2019-04-30 DIAGNOSIS — M159 Polyosteoarthritis, unspecified: Secondary | ICD-10-CM

## 2019-04-30 MED ORDER — MELOXICAM 7.5 MG PO TABS: 7.5000 mg | ORAL_TABLET | Freq: Every day | ORAL | 2 refills | Status: DC | PRN

## 2019-04-30 NOTE — Assessment & Plan Note (Signed)
Stable with intermittent joint pain flares  Plan: 1. Continue regular dosing of Tylenol Ext Str 500-1000mg  TID 2. May continue oral NSAID Meloxicam 7.5mg  PRN only - refill today 3. Re-consider topical NSAID diclofenac at future visit if needed 4. RICE therapy 5. Follow-up in future if flares, can consider X-ray - can refer to Ortho vs Neuro if need, future may need carpal tunnel inj

## 2019-04-30 NOTE — Assessment & Plan Note (Signed)
Stable without flare Refilled NSAID Meloxicam, use PRN

## 2019-04-30 NOTE — Assessment & Plan Note (Signed)
Mild elevated A1c up to 6.2 but improved from last result Improved lifestyle  Plan:  1. Not on any therapy currently  2. Encourage improved lifestyle - low carb, low sugar diet, reduce portion size, continue improving regular exercise as discussed  Check yearly now

## 2019-04-30 NOTE — Progress Notes (Signed)
Subjective:    Patient ID: Keith Bates, male    DOB: 24-Jan-1941, 78 y.o.   MRN: [id number]  Keith Bates is a 78 y.o. male presenting on 04/30/2019 for Annual Exam (Physical)   HPI   Here for Annual Physical and Lab Review.  Pre-Diabetes: Reports no concerns. 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 - Today doing well. Still taking Meloxicam 7.5mg  PRN, not taking regularly - needs new order - Also takes Tylenol, uses copper compression gloves - Worse with increased repetitive activity and yardwork - Other joints with pain aching and stiffness at times, if more active, including knees  HYPERLIPIDEMIA: - Reports no concerns. Last lipid panel 04/2019, mild elevated LDL - Currently taking Red Yeast Rice, tolerating well without side effects or myalgias - Decline statin  Health Maintenance: Received flu shot already at pharmacy. UTD PNA vaccine series  Prostate Cancer - Followed by Dr Tresa Moore, s/p robotic prostatectomy 06/2013 - No new concerns. Urology follows PSA  Depression screen [facility] 2/9 04/30/2019 10/19/2018 04/20/2018  Decreased Interest 0 0 0  Down, Depressed, Hopeless 1 0 0  PHQ - 2 Score 1 0 0  Altered sleeping 1 - -  Tired, decreased energy 0 - -  Change in appetite 0 - -  Feeling bad or failure about yourself  0 - -  Trouble concentrating 0 - -  Moving slowly or fidgety/restless 0 - -  Suicidal thoughts 0 - -  PHQ-9 Score 2 - -  Difficult doing work/chores Not difficult at all - -    Past Medical History:  Diagnosis Date  . Arthritis   . DDD (degenerative disc disease)   . Enlarged prostate   . Environmental allergies    . GERD (gastroesophageal reflux disease)   . Hx of ulcer disease   . Loss of weight   . Nocturia   . Prostate cancer ([place])   . Unspecified hemorrhoids without mention of complication    Past Surgical History:  Procedure Laterality Date  . COLONOSCOPY  03/13/2013  . HEMORRHOID BANDING    . LYMPHADENECTOMY Bilateral 06/21/2013   Procedure: LYMPHADENECTOMY  " BILATERAL PELVIC LYMPH NODE DISSECTION ;  Surgeon: Alexis Frock, MD;  Location: [facility];  Service: Urology;  Laterality: Bilateral;  . ROBOT ASSISTED LAPAROSCOPIC RADICAL PROSTATECTOMY N/A 06/21/2013   Procedure: ROBOTIC ASSISTED LAPAROSCOPIC RADICAL PROSTATECTOMY;  Surgeon: Alexis Frock, MD;  Location: [facility];  Service: Urology;  Laterality: N/A;   Social History   Socioeconomic History  . Marital status: Married    Spouse name: Not on file  . Number of children: Not on file  . Years of education: Not on file  . Highest education level: Not on file  Occupational History  . Not on file  Social Needs  . Financial resource strain: Not on file  . Food insecurity    Worry: Not on file    Inability: Not on file  . Transportation needs    Medical: Not on file    Non-medical: Not on file  Tobacco Use  . Smoking status: Former Smoker    Packs/day: 1.50    Years: 10.00    Pack years: 15.00    Types: Cigarettes  Quit date: 06/06/1972    Years since quitting: 46.9  . Smokeless tobacco: Former Network engineer and Sexual Activity  . Alcohol use: No  . Drug use: No  . Sexual activity: Not Currently  Lifestyle  . Physical activity    Days per week: Not on file    Minutes per session: Not on file  . Stress: Not on file  Relationships  . Social Herbalist on phone: Not on file    Gets together: Not on file    Attends religious service: Not on file    Active member of club or organization: Not on file    Attends meetings of clubs or organizations: Not on file    Relationship status: Not on file  . Intimate  partner violence    Fear of current or ex partner: Not on file    Emotionally abused: Not on file    Physically abused: Not on file    Forced sexual activity: Not on file  Other Topics Concern  . Not on file  Social History Narrative  . Not on file   Family History  Problem Relation Age of Onset  . Prostate cancer Father        with mets to nodes  . Cancer Brother        colon and prostate  . Heart attack Mother    Current Outpatient Medications on File Prior to Visit  Medication Sig  . fluticasone (FLONASE) 50 MCG/ACT nasal spray SPRAY 2 SPRAYS INTO EACH NOSTRIL EVERY DAY  . Multiple Vitamin (MULTIVITAMIN) tablet Take 1 tablet by mouth daily.  . Red Yeast Rice 600 MG CAPS Take 1 daily  . acetaminophen (TYLENOL) 500 MG tablet Take 1 tablet (500 mg total) by mouth every 6 (six) hours as needed. (Patient not taking: Reported on 04/30/2019)  . fexofenadine (ALLEGRA) 180 MG tablet Take 180 mg by mouth daily.   No current facility-administered medications on file prior to visit.     Review of Systems  Constitutional: Negative for activity change, appetite change, chills, diaphoresis, fatigue and fever.  HENT: Negative for congestion and hearing loss.   Eyes: Negative for visual disturbance.  Respiratory: Negative for apnea, cough, chest tightness, shortness of breath and wheezing.   Cardiovascular: Negative for chest pain, palpitations and leg swelling.  Gastrointestinal: Negative for abdominal pain, anal bleeding, blood in stool, constipation, diarrhea, nausea and vomiting.  Endocrine: Negative for cold intolerance.  Genitourinary: Negative for difficulty urinating, dysuria, frequency and hematuria.  Musculoskeletal: Negative for arthralgias, back pain and neck pain.  Skin: Negative for rash.  Allergic/Immunologic: Negative for environmental allergies.  Neurological: Negative for dizziness, weakness, light-headedness, numbness and headaches.  Hematological: Negative for  adenopathy.  Psychiatric/Behavioral: Negative for behavioral problems, dysphoric mood and sleep disturbance. The patient is not nervous/anxious.    Per HPI unless specifically indicated above      Objective:    BP 135/61 (BP Location: Left Arm, Patient Position: Sitting, Cuff Size: Normal)   Pulse (!) 54   Temp 98.7 F (37.1 C) (Oral)   Resp 14   Ht 5\' 6"  (1.676 m)   Wt 150 lb (68 kg)   SpO2 94%   BMI 24.21 kg/m   Wt Readings from Last 3 Encounters:  04/30/19 150 lb (68 kg)  10/19/18 162 lb (73.5 kg)  04/20/18 154 lb (69.9 kg)    Physical Exam Vitals signs and nursing note reviewed.  Constitutional:      General:  He is not in acute distress.    Appearance: He is well-developed. He is not diaphoretic.     Comments: Well-appearing, comfortable, cooperative  HENT:     Head: Normocephalic and atraumatic.  Eyes:     General:        Right eye: No discharge.        Left eye: No discharge.     Conjunctiva/sclera: Conjunctivae normal.     Pupils: Pupils are equal, round, and reactive to light.  Neck:     Musculoskeletal: Normal range of motion and neck supple.     Thyroid: No thyromegaly.     Comments: No carotid bruit Cardiovascular:     Rate and Rhythm: Normal rate and regular rhythm.     Heart sounds: Normal heart sounds. No murmur.  Pulmonary:     Effort: Pulmonary effort is normal. No respiratory distress.     Breath sounds: Normal breath sounds. No wheezing or rales.  Abdominal:     General: Bowel sounds are normal. There is no distension.     Palpations: Abdomen is soft. There is no mass.     Tenderness: There is no abdominal tenderness.  Musculoskeletal: Normal range of motion.        General: No tenderness.     Comments: Upper / Lower Extremities: - Normal muscle tone, strength bilateral upper extremities 5/5, lower extremities 5/5  Lymphadenopathy:     Cervical: No cervical adenopathy.  Skin:    General: Skin is warm and dry.     Findings: No erythema or  rash.  Neurological:     Mental Status: He is alert and oriented to person, place, and time.     Comments: Distal sensation intact to light touch all extremities  Psychiatric:        Behavior: Behavior normal.     Comments: Well groomed, good eye contact, normal speech and thoughts    Recent Labs    10/19/18 0857 04/23/19 0804  HGBA1C 6.3* 6.2*     Results for orders placed or performed in visit on 04/23/19  Lipid panel  Result Value Ref Range   Cholesterol 214 (H) <200 mg/dL   HDL 62 > OR = 40 mg/dL   Triglycerides 91 <150 mg/dL   LDL Cholesterol (Calc) 133 (H) mg/dL (calc)   Total CHOL/HDL Ratio 3.5 <5.0 (calc)   Non-HDL Cholesterol (Calc) 152 (H) <130 mg/dL (calc)  COMPLETE METABOLIC PANEL WITH GFR  Result Value Ref Range   Glucose, Bld 102 (H) 65 - 99 mg/dL   BUN 13 7 - 25 mg/dL   Creat 0.83 0.70 - 1.18 mg/dL   GFR, Est Non African American 85 > OR = 60 mL/min/1.64m2   GFR, Est African American 98 > OR = 60 mL/min/1.55m2   BUN/Creatinine Ratio NOT APPLICABLE 6 - 22 (calc)   Sodium 140 135 - 146 mmol/L   Potassium 4.2 3.5 - 5.3 mmol/L   Chloride 105 98 - 110 mmol/L   CO2 26 20 - 32 mmol/L   Calcium 9.4 8.6 - 10.3 mg/dL   Total Protein 6.4 6.1 - 8.1 g/dL   Albumin 3.9 3.6 - 5.1 g/dL   Globulin 2.5 1.9 - 3.7 g/dL (calc)   AG Ratio 1.6 1.0 - 2.5 (calc)   Total Bilirubin 0.6 0.2 - 1.2 mg/dL   Alkaline phosphatase (APISO) 75 35 - 144 U/L   AST 17 10 - 35 U/L   ALT 16 9 - 46 U/L  CBC with Differential/Platelet  Result Value Ref Range   WBC 6.1 3.8 - 10.8 Thousand/uL   RBC 4.62 4.20 - 5.80 Million/uL   Hemoglobin 13.9 13.2 - 17.1 g/dL   HCT 42.0 38.5 - 50.0 %   MCV 90.9 80.0 - 100.0 fL   MCH 30.1 27.0 - 33.0 pg   MCHC 33.1 32.0 - 36.0 g/dL   RDW 13.1 11.0 - 15.0 %   Platelets 200 140 - 400 Thousand/uL   MPV 10.3 7.5 - 12.5 fL   Neutro Abs 2,672 1,500 - 7,800 cells/uL   Lymphs Abs 2,550 850 - 3,900 cells/uL   Absolute Monocytes 708 200 - 950 cells/uL    Eosinophils Absolute 122 15 - 500 cells/uL   Basophils Absolute 49 0 - 200 cells/uL   Neutrophils Relative % 43.8 %   Total Lymphocyte 41.8 %   Monocytes Relative 11.6 %   Eosinophils Relative 2.0 %   Basophils Relative 0.8 %  Hemoglobin A1c  Result Value Ref Range   Hgb A1c MFr Bld 6.2 (H) <5.7 % of total Hgb   Mean Plasma Glucose 131 (calc)   eAG (mmol/L) 7.3 (calc)      Assessment & Plan:   Problem List Items Addressed This Visit    Hyperlipidemia    Controlled cholesterol on lifestyle Last lipid panel 04/2019 Calculated ASCVD 10 yr risk score 13%, near baseline 10%  Plan: 1. Discussed ASCVD risk reduction strategies - decline statin at this time 2. Continue ASA 81mg  for primary ASCVD risk reduction 3. Restart  OTC supplement Red Rice Yeast 4. Encourage improved lifestyle - low carb/cholesterol, reduce portion size, continue improving regular exercise      Pre-diabetes    Mild elevated A1c up to 6.2 but improved from last result Improved lifestyle  Plan:  1. Not on any therapy currently  2. Encourage improved lifestyle - low carb, low sugar diet, reduce portion size, continue improving regular exercise as discussed  Check yearly now      Primary osteoarthritis involving multiple joints    Stable with intermittent joint pain flares  Plan: 1. Continue regular dosing of Tylenol Ext Str 500-1000mg  TID 2. May continue oral NSAID Meloxicam 7.5mg  PRN only - refill today 3. Re-consider topical NSAID diclofenac at future visit if needed 4. RICE therapy 5. 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 meloxicam (MOBIC) 7.5 MG tablet    Sig: Take 1 tablet (7.5 mg total) by mouth daily as needed for pain. Up to 1-2 weeks as needed    Dispense:  30 tablet    Refill:  2    Follow up plan: Return in about 1 year (around 04/29/2020) for Annual Physical.  Future labs ordered for 04/27/20  Nobie Putnam, [facility] 04/30/2019, 9:09 AM

## 2019-04-30 NOTE — Patient Instructions (Addendum)
Thank you for coming to the office today.  Keep up the good work overall.  BP is great  Labs are mostly normal as we discussed  Keep limiting fried foods  Refilled Meloxicam  DUE for FASTING BLOOD WORK (no food or drink after midnight before the lab appointment, only water or coffee without cream/sugar on the morning of)  SCHEDULE "Lab Only" visit in the morning at the clinic for lab draw in 1 YEAR  - Make sure Lab Only appointment is at about 1 week before your next appointment, so that results will be available  For Lab Results, once available within 2-3 days of blood draw, you can can log in to MyChart online to view your results and a brief explanation. Also, we can discuss results at next follow-up visit.   Please schedule a Follow-up Appointment to: Return in about 1 year (around 04/29/2020) for Annual Physical.  If you have any other questions or concerns, please feel free to call the office or send a message through [facility]. You may also schedule an earlier appointment if necessary.  Additionally, you may be receiving a survey about your experience at our office within a few days to 1 week by e-mail or mail. We value your feedback.  Nobie Putnam, DO [facility]

## 2019-04-30 NOTE — Assessment & Plan Note (Signed)
Controlled cholesterol on lifestyle Last lipid panel 04/2019 Calculated ASCVD 10 yr risk score 13%, near baseline 10%  Plan: 1. Discussed ASCVD risk reduction strategies - decline statin at this time 2. Continue ASA 81mg  for primary ASCVD risk reduction 3. Restart  OTC supplement Red Rice Yeast 4. Encourage improved lifestyle - low carb/cholesterol, reduce portion size, continue improving regular exercise

## 2019-08-27 ENCOUNTER — Ambulatory Visit (INDEPENDENT_AMBULATORY_CARE_PROVIDER_SITE_OTHER): Payer: Medicare Other

## 2019-08-27 VITALS — Ht 66.0 in | Wt 150.0 lb

## 2019-08-27 DIAGNOSIS — Z0000 Encounter for general adult medical examination without abnormal findings: Secondary | ICD-10-CM

## 2019-08-27 NOTE — Patient Instructions (Signed)
Keith Bates , Thank you for taking time to come for your Medicare Wellness Visit. I appreciate your ongoing commitment to your health goals. Please review the following plan we discussed and let me know if I can assist you in the future.   Screening recommendations/referrals: Colonoscopy: no longer required Recommended yearly ophthalmology/optometry visit for glaucoma screening and checkup Recommended yearly dental visit for hygiene and checkup  Vaccinations: Influenza vaccine: up to date  Pneumococcal vaccine: up to date  Tdap vaccine: up to date  Shingles vaccine: shingrix eligible     Advanced directives: Advance directive discussed with you today.please pick up a copy of this information next time you are in the office. Once this is complete please bring a copy in to our office so we can scan it into your chart.  Conditions/risks identified: none   Next appointment: follow up in one year for your annual wellness visit   Preventive Care 65 Years and Older, Male Preventive care refers to lifestyle choices and visits with your health care provider that can promote health and wellness. What does preventive care include?  A yearly physical exam. This is also called an annual well check.  Dental exams once or twice a year.  Routine eye exams. Ask your health care provider how often you should have your eyes checked.  Personal lifestyle choices, including:  Daily care of your teeth and gums.  Regular physical activity.  Eating a healthy diet.  Avoiding tobacco and drug use.  Limiting alcohol use.  Practicing safe sex.  Taking low doses of aspirin every day.  Taking vitamin and mineral supplements as recommended by your health care provider. What happens during an annual well check? The services and screenings done by your health care provider during your annual well check will depend on your age, overall health, lifestyle risk factors, and family history of  disease. Counseling  Your health care provider may ask you questions about your:  Alcohol use.  Tobacco use.  Drug use.  Emotional well-being.  Home and relationship well-being.  Sexual activity.  Eating habits.  History of falls.  Memory and ability to understand (cognition).  Work and work Statistician. Screening  You may have the following tests or measurements:  Height, weight, and BMI.  Blood pressure.  Lipid and cholesterol levels. These may be checked every 5 years, or more frequently if you are over 52 years old.  Skin check.  Lung cancer screening. You may have this screening every year starting at age 102 if you have a 30-pack-year history of smoking and currently smoke or have quit within the past 15 years.  Fecal occult blood test (FOBT) of the stool. You may have this test every year starting at age 29.  Flexible sigmoidoscopy or colonoscopy. You may have a sigmoidoscopy every 5 years or a colonoscopy every 10 years starting at age 72.  Prostate cancer screening. Recommendations will vary depending on your family history and other risks.  Hepatitis C blood test.  Hepatitis B blood test.  Sexually transmitted disease (STD) testing.  Diabetes screening. This is done by checking your blood sugar (glucose) after you have not eaten for a while (fasting). You may have this done every 1-3 years.  Abdominal aortic aneurysm (AAA) screening. You may need this if you are a current or former smoker.  Osteoporosis. You may be screened starting at age 64 if you are at high risk. Talk with your health care provider about your test results, treatment options, and  if necessary, the need for more tests. Vaccines  Your health care provider may recommend certain vaccines, such as:  Influenza vaccine. This is recommended every year.  Tetanus, diphtheria, and acellular pertussis (Tdap, Td) vaccine. You may need a Td booster every 10 years.  Zoster vaccine. You may  need this after age 97.  Pneumococcal 13-valent conjugate (PCV13) vaccine. One dose is recommended after age 39.  Pneumococcal polysaccharide (PPSV23) vaccine. One dose is recommended after age 75. Talk to your health care provider about which screenings and vaccines you need and how often you need them. This information is not intended to replace advice given to you by your health care provider. Make sure you discuss any questions you have with your health care provider. Document Released: 08/21/2015 Document Revised: 04/13/2016 Document Reviewed: 05/26/2015 Elsevier Interactive Patient Education  2017 [place] Prevention in the Home Falls can cause injuries. They can happen to people of all ages. There are many things you can do to make your home safe and to help prevent falls. What can I do on the outside of my home?  Regularly fix the edges of walkways and driveways and fix any cracks.  Remove anything that might make you trip as you walk through a door, such as a raised step or threshold.  Trim any bushes or trees on the path to your home.  Use bright outdoor lighting.  Clear any walking paths of anything that might make someone trip, such as rocks or tools.  Regularly check to see if handrails are loose or broken. Make sure that both sides of any steps have handrails.  Any raised decks and porches should have guardrails on the edges.  Have any leaves, snow, or ice cleared regularly.  Use sand or salt on walking paths during winter.  Clean up any spills in your garage right away. This includes oil or grease spills. What can I do in the bathroom?  Use night lights.  Install grab bars by the toilet and in the tub and shower. Do not use towel bars as grab bars.  Use non-skid mats or decals in the tub or shower.  If you need to sit down in the shower, use a plastic, non-slip stool.  Keep the floor dry. Clean up any water that spills on the floor as soon as it  happens.  Remove soap buildup in the tub or shower regularly.  Attach bath mats securely with double-sided non-slip rug tape.  Do not have throw rugs and other things on the floor that can make you trip. What can I do in the bedroom?  Use night lights.  Make sure that you have a light by your bed that is easy to reach.  Do not use any sheets or blankets that are too big for your bed. They should not hang down onto the floor.  Have a firm chair that has side arms. You can use this for support while you get dressed.  Do not have throw rugs and other things on the floor that can make you trip. What can I do in the kitchen?  Clean up any spills right away.  Avoid walking on wet floors.  Keep items that you use a lot in easy-to-reach places.  If you need to reach something above you, use a strong step stool that has a grab bar.  Keep electrical cords out of the way.  Do not use floor polish or wax that makes floors slippery. If you  must use wax, use non-skid floor wax.  Do not have throw rugs and other things on the floor that can make you trip. What can I do with my stairs?  Do not leave any items on the stairs.  Make sure that there are handrails on both sides of the stairs and use them. Fix handrails that are broken or loose. Make sure that handrails are as long as the stairways.  Check any carpeting to make sure that it is firmly attached to the stairs. Fix any carpet that is loose or worn.  Avoid having throw rugs at the top or bottom of the stairs. If you do have throw rugs, attach them to the floor with carpet tape.  Make sure that you have a light switch at the top of the stairs and the bottom of the stairs. If you do not have them, ask someone to add them for you. What else can I do to help prevent falls?  Wear shoes that:  Do not have high heels.  Have rubber bottoms.  Are comfortable and fit you well.  Are closed at the toe. Do not wear sandals.  If you  use a stepladder:  Make sure that it is fully opened. Do not climb a closed stepladder.  Make sure that both sides of the stepladder are locked into place.  Ask someone to hold it for you, if possible.  Clearly mark and make sure that you can see:  Any grab bars or handrails.  First and last steps.  Where the edge of each step is.  Use tools that help you move around (mobility aids) if they are needed. These include:  Canes.  Walkers.  Scooters.  Crutches.  Turn on the lights when you go into a dark area. Replace any light bulbs as soon as they burn out.  Set up your furniture so you have a clear path. Avoid moving your furniture around.  If any of your floors are uneven, fix them.  If there are any pets around you, be aware of where they are.  Review your medicines with your doctor. Some medicines can make you feel dizzy. This can increase your chance of falling. Ask your doctor what other things that you can do to help prevent falls. This information is not intended to replace advice given to you by your health care provider. Make sure you discuss any questions you have with your health care provider. Document Released: 05/21/2009 Document Revised: 12/31/2015 Document Reviewed: 08/29/2014 Elsevier Interactive Patient Education  2017 [employer].

## 2019-08-27 NOTE — Progress Notes (Addendum)
Subjective:   Keith Bates is a 79 y.o. male who presents for an Initial Medicare Annual Wellness Visit.  This visit is being conducted via phone call  - after an attmept to do on video chat - due to the COVID-19 pandemic. This patient has given me verbal consent via phone to conduct this visit, patient states they are participating from their home address. Some vital signs may be absent or patient reported.   Patient identification: identified by name, DOB, and current address.    Review of Systems  Cardiac Risk Factors include: advanced age (>69men, >40 women);male gender    Objective:    Today's Vitals   08/27/19 1309  Weight: 150 lb (68 kg)  Height: 5\' 6"  (1.676 m)   Body mass index is 24.21 kg/m.  Advanced Directives 08/27/2019 07/23/2013 06/21/2013 06/17/2013  Does Patient Have a Medical Advance Directive? No Patient does not have advance directive;Patient would not like information Patient does not have advance directive Patient does not have advance directive  Pre-existing out of facility DNR order (yellow form or pink MOST form) - - No -    Current Medications (verified) Outpatient Encounter Medications as of 08/27/2019  Medication Sig  . fluticasone (FLONASE) 50 MCG/ACT nasal spray SPRAY 2 SPRAYS INTO EACH NOSTRIL EVERY DAY  . meloxicam (MOBIC) 7.5 MG tablet Take 1 tablet (7.5 mg total) by mouth daily as needed for pain. Up to 1-2 weeks as needed  . Multiple Vitamin (MULTIVITAMIN) tablet Take 1 tablet by mouth daily.  Marland Kitchen acetaminophen (TYLENOL) 500 MG tablet Take 1 tablet (500 mg total) by mouth every 6 (six) hours as needed. (Patient not taking: Reported on 08/27/2019)  . fexofenadine (ALLEGRA) 180 MG tablet Take 180 mg by mouth daily.  . [DISCONTINUED] Red Yeast Rice 600 MG CAPS Take 1 daily (Patient not taking: Reported on 08/27/2019)   No facility-administered encounter medications on file as of 08/27/2019.    Allergies (verified) Other, Plasticized base  [plastibase], and Penicillins   History: Past Medical History:  Diagnosis Date  . Arthritis   . DDD (degenerative disc disease)   . Enlarged prostate   . Environmental allergies   . GERD (gastroesophageal reflux disease)   . Hx of ulcer disease   . Loss of weight   . Nocturia   . Prostate cancer ([place])   . Unspecified hemorrhoids without mention of complication    Past Surgical History:  Procedure Laterality Date  . COLONOSCOPY  03/13/2013  . HEMORRHOID BANDING    . LYMPHADENECTOMY Bilateral 06/21/2013   Procedure: LYMPHADENECTOMY  " BILATERAL PELVIC LYMPH NODE DISSECTION ;  Surgeon: Alexis Frock, MD;  Location: [facility];  Service: Urology;  Laterality: Bilateral;  . ROBOT ASSISTED LAPAROSCOPIC RADICAL PROSTATECTOMY N/A 06/21/2013   Procedure: ROBOTIC ASSISTED LAPAROSCOPIC RADICAL PROSTATECTOMY;  Surgeon: Alexis Frock, MD;  Location: [facility];  Service: Urology;  Laterality: N/A;   Family History  Problem Relation Age of Onset  . Prostate cancer Father        with mets to nodes  . Cancer Brother        colon and prostate  . Heart attack Mother    Social History   Socioeconomic History  . Marital status: Married    Spouse name: Not on file  . Number of children: Not on file  . Years of education: Not on file  . Highest education level: Not on file  Occupational History  . Not on file  Tobacco Use  . Smoking  status: Former Smoker    Packs/day: 1.50    Years: 10.00    Pack years: 15.00    Types: Cigarettes    Quit date: 06/06/1972    Years since quitting: 47.2  . Smokeless tobacco: Former Network engineer and Sexual Activity  . Alcohol use: No  . Drug use: No  . Sexual activity: Not Currently  Other Topics Concern  . Not on file  Social History Narrative  . Not on file   Social Determinants of Health   Financial Resource Strain:   . Difficulty of Paying Living Expenses: Not on file  Food Insecurity:   . Worried About Charity fundraiser in the Last Year: Not  on file  . Ran Out of Food in the Last Year: Not on file  Transportation Needs:   . Lack of Transportation (Medical): Not on file  . Lack of Transportation (Non-Medical): Not on file  Physical Activity:   . Days of Exercise per Week: Not on file  . Minutes of Exercise per Session: Not on file  Stress:   . Feeling of Stress : Not on file  Social Connections:   . Frequency of Communication with Friends and Family: Not on file  . Frequency of Social Gatherings with Friends and Family: Not on file  . Attends Religious Services: Not on file  . Active Member of Clubs or Organizations: Not on file  . Attends Archivist Meetings: Not on file  . Marital Status: Not on file   Tobacco Counseling Counseling given: Not Answered   Clinical Intake:                       Activities of Daily Living In your present state of health, do you have any difficulty performing the following activities: 08/27/2019  Hearing? N  Comment no hearing aids  Vision? N  Comment reading glasses, no eye dr  Difficulty concentrating or making decisions? N  Walking or climbing stairs? Y  Comment uses railing  Dressing or bathing? N  Doing errands, shopping? N  Preparing Food and eating ? N  Using the Toilet? N  In the past six months, have you accidently leaked urine? N  Do you have problems with loss of bowel control? N  Managing your Medications? N  Managing your Finances? N  Housekeeping or managing your Housekeeping? N  Some recent data might be hidden     Immunizations and Health Maintenance Immunization History  Administered Date(s) Administered  . Influenza, High Dose Seasonal PF 04/20/2017, 03/28/2019  . Influenza-Unspecified 03/30/2019   There are no preventive care reminders to display for this patient.  Patient Care Team: Olin Hauser, DO as PCP - General (Family Medicine) Christene Lye, MD (General Surgery)  Indicate any recent Medical  Services you may have received from other than [facility] providers in the past year (date may be approximate).    Assessment:   This is a routine wellness examination for [place].  Hearing/Vision screen No exam data present  Dietary issues and exercise activities discussed: Current Exercise Habits: Home exercise routine, Type of exercise: stretching;strength training/weights(sit ups and stretches), Time (Minutes): 20, Frequency (Times/Week): 7, Weekly Exercise (Minutes/Week): 140, Intensity: Mild, Exercise limited by: None identified  Goals   None    Depression Screen PHQ 2/9 Scores 08/27/2019 04/30/2019 10/19/2018 04/20/2018  PHQ - 2 Score 0 1 0 0  PHQ- 9 Score - 2 - -    Fall Risk  Fall Risk  08/27/2019 10/19/2018 04/20/2018 10/18/2017 01/12/2017  Falls in the past year? 0 0 No No No  Number falls in past yr: 0 - - - -  Injury with Fall? 0 - - - -  Follow up - Falls evaluation completed - - -    FALL RISK PREVENTION PERTAINING TO THE HOME:  Any stairs in or around the home? Yes  If so, are there any without handrails? No   Home free of loose throw rugs in walkways, pet beds, electrical cords, etc? Yes  Adequate lighting in your home to reduce risk of falls? Yes   ASSISTIVE DEVICES UTILIZED TO PREVENT FALLS:  Life alert? No  Use of a cane, walker or w/c? No  Grab bars in the bathroom? yes Shower chair or bench in shower? No  Elevated toilet seat or a handicapped toilet? No    TIMED UP AND GO:  Unable to perform   Cognitive Function:        Screening Tests Health Maintenance  Topic Date Due  . TETANUS/TDAP  03/30/2023  . INFLUENZA VACCINE  Completed  . PNA vac Low Risk Adult  Completed    Qualifies for Shingles Vaccine? Yes, shingrix eligible   Tdap: up to date   Flu Vaccine: up to date   Pneumococcal Vaccine: up to date   Cancer Screenings:  Colorectal Screening: no longer required   Lung Cancer Screening: (Low Dose CT Chest recommended if Age 64-80 years, 30  pack-year currently smoking OR have quit w/in 15years.) does not qualify.    Additional Screening:  Hepatitis C Screening: does not qualify  Vision Screening: Recommended annual ophthalmology exams for early detection of glaucoma and other disorders of the eye. Is the patient up to date with their annual eye exam?  No     Dental Screening: Recommended annual dental exams for proper oral hygiene  Community Resource Referral:  CRR required this visit?  No        Plan:  I have personally reviewed and addressed the Medicare Annual Wellness questionnaire and have noted the following in the patient's chart:  A. Medical and social history B. Use of alcohol, tobacco or illicit drugs  C. Current medications and supplements D. Functional ability and status E.  Nutritional status F.  Physical activity G. Advance directives H. List of other physicians I.  Hospitalizations, surgeries, and ER visits in previous 12 months J.  [place] such as hearing and vision if needed, cognitive and depression L. Referrals and appointments   In addition, I have reviewed and discussed with patient certain preventive protocols, quality metrics, and best practice recommendations. A written personalized care plan for preventive services as well as general preventive health recommendations were provided to patient.   Signed,    Bevelyn Ngo, LPN   [id number]  Nurse Health Advisor   Nurse Notes: none

## 2019-09-08 ENCOUNTER — Other Ambulatory Visit: Payer: Self-pay | Admitting: Family Medicine

## 2019-09-08 DIAGNOSIS — J3089 Other allergic rhinitis: Secondary | ICD-10-CM

## 2019-09-08 DIAGNOSIS — M159 Polyosteoarthritis, unspecified: Secondary | ICD-10-CM

## 2019-09-08 DIAGNOSIS — M069 Rheumatoid arthritis, unspecified: Secondary | ICD-10-CM

## 2019-11-22 ENCOUNTER — Other Ambulatory Visit: Payer: Self-pay | Admitting: Family Medicine

## 2019-11-22 DIAGNOSIS — J3089 Other allergic rhinitis: Secondary | ICD-10-CM

## 2019-11-22 MED ORDER — FLUTICASONE PROPIONATE 50 MCG/ACT NA SUSP: 2.0000 | Freq: Every day | NASAL | 3 refills | Status: DC

## 2019-11-22 NOTE — Telephone Encounter (Signed)
Requested medication (s) are due for refill today:Yes  Requested medication (s) are on the active medication list: No  Last refill:  09/09/19  Future visit scheduled: Yes  Notes to clinic:  Medication is not on med list. Pharmacy states medication not covered and requesting a replacement.    Requested Prescriptions  Pending Prescriptions Disp Refills   mometasone (NASONEX) 50 MCG/ACT nasal spray [Pharmacy Med Name: MOMETASONE FUROATE 50 MCG SPRY] 17 g 0    Sig: Please specify directions, refills and quantity      Ear, Nose, and Throat: Nasal Preparations - Corticosteroids Failed - 11/22/2019 10:41 AM      Failed - Valid encounter within last 12 months    Recent Outpatient Visits           6 months ago Annual physical exam   [place], DO   1 year ago [place], Devonne Doughty, DO   1 year ago Annual physical exam   [facility] Olin Hauser, DO   2 years ago [place], DO   2 years ago Primary osteoarthritis involving multiple joints   [place], Devonne Doughty, [place]

## 2019-12-13 ENCOUNTER — Encounter: Payer: Self-pay | Admitting: Family Medicine

## 2019-12-13 ENCOUNTER — Ambulatory Visit (INDEPENDENT_AMBULATORY_CARE_PROVIDER_SITE_OTHER): Payer: Medicare Other | Admitting: Family Medicine

## 2019-12-13 ENCOUNTER — Other Ambulatory Visit: Payer: Self-pay

## 2019-12-13 VITALS — BP 148/65 | HR 57 | Temp 97.4°F | Resp 16 | Ht 66.0 in | Wt 165.0 lb

## 2019-12-13 DIAGNOSIS — R310 Gross hematuria: Secondary | ICD-10-CM

## 2019-12-13 DIAGNOSIS — N3001 Acute cystitis with hematuria: Secondary | ICD-10-CM

## 2019-12-13 DIAGNOSIS — K648 Other hemorrhoids: Secondary | ICD-10-CM

## 2019-12-13 MED ORDER — HYDROCORTISONE ACETATE 25 MG RE SUPP: 25.0000 mg | Freq: Two times a day (BID) | RECTAL | 2 refills | Status: DC | PRN

## 2019-12-13 MED ORDER — CIPROFLOXACIN HCL 500 MG PO TABS: 500.0000 mg | ORAL_TABLET | Freq: Two times a day (BID) | ORAL | 0 refills | Status: DC

## 2019-12-13 NOTE — Progress Notes (Signed)
Subjective:    Patient ID: Keith Bates, male    DOB: [birth year], 79 y.o.   MRN: [id number]  Keith Bates is a 79 y.o. male presenting on 12/13/2019 for Hematuria (back pain onset 3 weeks ) and Hemorrhoids  Patient presents for a same day appointment.  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 Using topical preparation H PRN some relief, uses suppository requesting refill.  He used to follow w [facility] in [facility], no prior surgery done Admits constipation and heavy lifting straining at times Denies bleeding or significant pain.  Gross Hematuria / Possible UTI Admits some dysuria urinary frequency, and blood seen in urine. Followed by Dr Tresa Moore [facility]) - for prostate cancer surveillance after treatment No prior significant hematuria based on his report No history kidney stones, no flank or other back pain Not on medicine.  Health Maintenance: UTD COVID19 Vaccine, will send [place] copy of card  Depression screen [facility] 2/9 12/13/2019 08/27/2019 04/30/2019  Decreased Interest 0 0 0  Down, Depressed, Hopeless 0 0 1  PHQ - 2 Score 0 0 1  Altered sleeping - - 1  Tired, decreased energy - - 0  Change in appetite - - 0  Feeling bad or failure about yourself  - - 0  Trouble concentrating - - 0  Moving slowly or fidgety/restless - - 0  Suicidal thoughts - - 0  PHQ-9 Score - - 2  Difficult doing work/chores - - Not difficult at all    Social History   Tobacco Use  . Smoking status: Former Smoker    Packs/day: 1.50    Years: 10.00    Pack years: 15.00    Types: Cigarettes    Quit date: 06/06/1972    Years since quitting: 47.5  . Smokeless tobacco: Former Network engineer Use Topics  . Alcohol use: No  . Drug use: No    Review of Systems Per HPI unless specifically indicated above       Objective:    BP (!) 148/65   Pulse (!) 57   Temp (!) 97.4 F (36.3 C) (Temporal)   Resp 16   Ht 5\' 6"  (1.676 m)   Wt 165 lb (74.8 kg)   SpO2 100%   BMI 26.63 kg/m   Wt Readings from Last 3 Encounters:  12/13/19 165 lb (74.8 kg)  08/27/19 150 lb (68 kg)  04/30/19 150 lb (68 kg)    Physical Exam Vitals and nursing note reviewed.  Constitutional:      General: He is not in acute distress.    Appearance: He is well-developed. He is not diaphoretic.     Comments: Well-appearing, comfortable, cooperative  HENT:     Head: Normocephalic and atraumatic.  Eyes:     General:        Right eye: No discharge.        Left eye: No discharge.     Conjunctiva/sclera: Conjunctivae normal.  Cardiovascular:     Rate and Rhythm: Normal rate.  Pulmonary:     Effort: Pulmonary effort is normal.  Skin:    General: Skin is warm and dry.     Findings: No erythema or rash.  Neurological:     Mental Status: He is alert and oriented to person, place, and time.  Psychiatric:  Behavior: Behavior normal.     Comments: Well groomed, good eye contact, normal speech and thoughts    Results for orders placed or performed in visit on 04/23/19  Lipid panel  Result Value Ref Range   Cholesterol 214 (H) <200 mg/dL   HDL 62 > OR = 40 mg/dL   Triglycerides 91 <150 mg/dL   LDL Cholesterol (Calc) 133 (H) mg/dL (calc)   Total CHOL/HDL Ratio 3.5 <5.0 (calc)   Non-HDL Cholesterol (Calc) 152 (H) <130 mg/dL (calc)  COMPLETE METABOLIC PANEL WITH GFR  Result Value Ref Range   Glucose, Bld 102 (H) 65 - 99 mg/dL   BUN 13 7 - 25 mg/dL   Creat 0.83 0.70 - 1.18 mg/dL   GFR, Est Non African American 85 > OR = 60 mL/min/1.39m2   GFR, Est African American 98 > OR = 60 mL/min/1.23m2   BUN/Creatinine Ratio NOT APPLICABLE 6 - 22 (calc)   Sodium 140 135 - 146 mmol/L   Potassium 4.2 3.5 - 5.3 mmol/L   Chloride 105 98 - 110 mmol/L   CO2 26 20 - 32 mmol/L   Calcium 9.4 8.6 - 10.3 mg/dL   Total Protein 6.4  6.1 - 8.1 g/dL   Albumin 3.9 3.6 - 5.1 g/dL   Globulin 2.5 1.9 - 3.7 g/dL (calc)   AG Ratio 1.6 1.0 - 2.5 (calc)   Total Bilirubin 0.6 0.2 - 1.2 mg/dL   Alkaline phosphatase (APISO) 75 35 - 144 U/L   AST 17 10 - 35 U/L   ALT 16 9 - 46 U/L  CBC with Differential/Platelet  Result Value Ref Range   WBC 6.1 3.8 - 10.8 Thousand/uL   RBC 4.62 4.20 - 5.80 Million/uL   Hemoglobin 13.9 13.2 - 17.1 g/dL   HCT 42.0 38.5 - 50.0 %   MCV 90.9 80.0 - 100.0 fL   MCH 30.1 27.0 - 33.0 pg   MCHC 33.1 32.0 - 36.0 g/dL   RDW 13.1 11.0 - 15.0 %   Platelets 200 140 - 400 Thousand/uL   MPV 10.3 7.5 - 12.5 fL   Neutro Abs 2,672 1,500 - 7,800 cells/uL   Lymphs Abs 2,550 850 - 3,900 cells/uL   Absolute Monocytes 708 200 - 950 cells/uL   Eosinophils Absolute 122 15 - 500 cells/uL   Basophils Absolute 49 0 - 200 cells/uL   Neutrophils Relative % 43.8 %   Total Lymphocyte 41.8 %   Monocytes Relative 11.6 %   Eosinophils Relative 2.0 %   Basophils Relative 0.8 %  Hemoglobin A1c  Result Value Ref Range   Hgb A1c MFr Bld 6.2 (H) <5.7 % of total Hgb   Mean Plasma Glucose 131 (calc)   eAG (mmol/L) 7.3 (calc)      Assessment & Plan:   Problem List Items Addressed This Visit    Internal hemorrhoids   Relevant Medications   hydrocortisone (ANUSOL-HC) 25 MG suppository    Other Visit Diagnoses    Gross hematuria    -  Primary   Relevant Medications   ciprofloxacin (CIPRO) 500 MG tablet   Other Relevant Orders   Urinalysis, Routine w reflex microscopic   Urine Culture      #Gross Hematuria Presumed acute UTI History of prostate cancer Followed by [facility] with some mild dysuria and urinary frequency and gross hematuria - would consider possible UTI as cause, however with prostate cancer history and followed by Urology will run urine tests today then ask  patient to follow-up with Uro for determination of further work up for gross hematuria - Will provide  empiric coverage Cipro 500 BID x 5 days, sent to pharmacy, precautions given for FQ side effects risk tendon injury - Ordered send out Urinalysis / reflex micro / Urine Culture - fax results to Dr Tresa Moore upon review  # Subacute internal hemorrhoid flare with swelling and prolapse, declined exam today History of some discomfort without bleeding, prolapse, self reducible but has recurrence Chronic problem for 50+ years internal hemorrhoids Previously [facility] Dr Jamal Collin 2014 Temporary relief on suppository and preparation H  Today discussed longer term management with procedural intervention Re order Anusol suppository - advised use 1 week then stop reuse PRN Referral to Gen Surg - [facility] Goal to limit straining and BMs. Improve bowel habits Precautions given  Orders Placed This Encounter  Procedures  . Urine Culture  . Urinalysis, Routine w reflex microscopic  . Ambulatory referral to General Surgery    Referral Priority:   Routine    Referral Type:   Surgical    Referral Reason:   Specialty Services Required    Requested Specialty:   General Surgery    Number of Visits Requested:   1  . POCT Urinalysis Dipstick      Meds ordered this encounter  Medications  . hydrocortisone (ANUSOL-HC) 25 MG suppository    Sig: Place 1 suppository (25 mg total) rectally 2 (two) times daily as needed for hemorrhoids or anal itching. For 7 days    Dispense:  24 suppository    Refill:  2  . ciprofloxacin (CIPRO) 500 MG tablet    Sig: Take 1 tablet (500 mg total) by mouth 2 (two) times daily. For 5 days    Dispense:  10 tablet    Refill:  0     Follow up plan: Return if symptoms worsen or fail to improve, for hematuria, hemorrhoids.   Nobie Putnam, [facility] 12/13/2019, 2:08 PM

## 2019-12-13 NOTE — Patient Instructions (Addendum)
Thank you for coming to the office today.  You have an inflamed internal hemorrhoid, which involves swollen veins on your rectum, it is very sensitive and causes your severe pain. You may experience worsening pain and bleeding with bright red blood if the hemorrhoid develops a superficial blood clot. Also you may have deeper internal hemorrhoids that can cause bleeding without as much pain. - Start using the Anusol suppository twice a day as prescribed for 1 week, given a refill if needed for flare up - continue the warm bathtub soak 1-2 times daily for next week if you can, or can try the [facility] for just your bottom - Try to stay well hydrated, avoid constipation and straining, eat a high fiber diet  Take at least 1 week off between using suppository.  ----------------------------------------------  Referral to Surgery  [facility] Address: [address], [place], [place] [address] Phone: [phone]  Stay tuned.  For blood in urine.  STart Cipro 500mg  twice a day for 5 days. Check urine and we will call you with results.  Regardless, please reach out to Dr Tresa Moore at [facility] to update him. We can also fax copy of results.   Please schedule a Follow-up Appointment to: Return if symptoms worsen or fail to improve, for hematuria, hemorrhoids.  If you have any other questions or concerns, please feel free to call the office or send a message through [place]. You may also schedule an earlier appointment if necessary.  Additionally, you may be receiving a survey about your experience at our office within a few days to 1 week by e-mail or mail. We value your feedback.  Nobie Putnam, DO [place]

## 2019-12-14 LAB — URINALYSIS, ROUTINE W REFLEX MICROSCOPIC
Bacteria, UA: NONE SEEN /HPF
Bilirubin Urine: NEGATIVE
Glucose, UA: NEGATIVE
Hyaline Cast: NONE SEEN /LPF
Ketones, ur: NEGATIVE
Leukocytes,Ua: NEGATIVE
Nitrite: NEGATIVE
Protein, ur: NEGATIVE
Specific Gravity, Urine: 1.024 (ref 1.001–1.03)
Squamous Epithelial / HPF: NONE SEEN /HPF (ref <=5)
pH: <=5 (ref 5.0–8.0)

## 2019-12-14 LAB — URINE CULTURE
MICRO NUMBER:: 10452693
Result:: NO GROWTH
SPECIMEN QUALITY:: ADEQUATE

## 2019-12-23 ENCOUNTER — Ambulatory Visit (INDEPENDENT_AMBULATORY_CARE_PROVIDER_SITE_OTHER): Payer: Medicare Other | Admitting: Surgery

## 2019-12-23 ENCOUNTER — Encounter: Payer: Self-pay | Admitting: Surgery

## 2019-12-23 ENCOUNTER — Other Ambulatory Visit: Payer: Self-pay

## 2019-12-23 VITALS — BP 148/72 | HR 67 | Temp 91.8°F | Ht 67.5 in | Wt 162.6 lb

## 2019-12-23 DIAGNOSIS — K648 Other hemorrhoids: Secondary | ICD-10-CM | POA: Diagnosis not present

## 2019-12-23 NOTE — Patient Instructions (Addendum)
Dr.Piscoya discussed with patient to follow up with Urologist to discuss blood in urine.  Dr.Piscoya recommends patient to continue with the Anusol Suppositories and High Fiber Diet. Patient recommended to try the [facility] (attached below). Patient is to avoid hot towels to the anal area, to prevent burning of the tissue in the area.    Hemorrhoids Hemorrhoids are swollen veins in and around the rectum or anus. There are two types of hemorrhoids:  Internal hemorrhoids. These occur in the veins that are just inside the rectum. They may poke through to the outside and become irritated and painful.  External hemorrhoids. These occur in the veins that are outside the anus and can be felt as a painful swelling or hard lump near the anus. Most hemorrhoids do not cause serious problems, and they can be managed with home treatments such as diet and lifestyle changes. If home treatments do not help the symptoms, procedures can be done to shrink or remove the hemorrhoids. What are the causes? This condition is caused by increased pressure in the anal area. This pressure may result from various things, including:  Constipation.  Straining to have a bowel movement.  Diarrhea.  Pregnancy.  Obesity.  Sitting for long periods of time.  Heavy lifting or other activity that causes you to strain.  Anal sex.  Riding a bike for a long period of time. What are the signs or symptoms? Symptoms of this condition include:  Pain.  Anal itching or irritation.  Rectal bleeding.  Leakage of stool (feces).  Anal swelling.  One or more lumps around the anus. How is this diagnosed? This condition can often be diagnosed through a visual exam. Other exams or tests may also be done, such as:  An exam that involves feeling the rectal area with a gloved hand (digital rectal exam).  An exam of the anal canal that is done using a small tube (anoscope).  A blood test, if you have lost a significant  amount of blood.  A test to look inside the colon using a flexible tube with a camera on the end (sigmoidoscopy or colonoscopy). How is this treated? This condition can usually be treated at home. However, various procedures may be done if dietary changes, lifestyle changes, and other home treatments do not help your symptoms. These procedures can help make the hemorrhoids smaller or remove them completely. Some of these procedures involve surgery, and others do not. Common procedures include:  Rubber band ligation. Rubber bands are placed at the base of the hemorrhoids to cut off their blood supply.  Sclerotherapy. Medicine is injected into the hemorrhoids to shrink them.  Infrared coagulation. A type of light energy is used to get rid of the hemorrhoids.  Hemorrhoidectomy surgery. The hemorrhoids are surgically removed, and the veins that supply them are tied off.  Stapled hemorrhoidopexy surgery. The surgeon staples the base of the hemorrhoid to the rectal wall. Follow these instructions at home: Eating and drinking   Eat foods that have a lot of fiber in them, such as whole grains, beans, nuts, fruits, and vegetables.  Ask your health care provider about taking products that have added fiber (fiber supplements).  Reduce the amount of fat in your diet. You can do this by eating low-fat dairy products, eating less red meat, and avoiding processed foods.  Drink enough fluid to keep your urine pale yellow. Managing pain and swelling   Take warm sitz baths for 20 minutes, 3-4 times a day to  ease pain and discomfort. You may do this in a bathtub or using a portable sitz bath that fits over the toilet.  If directed, apply ice to the affected area. Using ice packs between sitz baths may be helpful. ? Put ice in a plastic bag. ? Place a towel between your skin and the bag. ? Leave the ice on for 20 minutes, 2-3 times a day. General instructions  Take over-the-counter and prescription  medicines only as told by your health care provider.  Use medicated creams or suppositories as told.  Get regular exercise. Ask your health care provider how much and what kind of exercise is best for you. In general, you should do moderate exercise for at least 30 minutes on most days of the week (150 minutes each week). This can include activities such as walking, biking, or yoga.  Go to the bathroom when you have the urge to have a bowel movement. Do not wait.  Avoid straining to have bowel movements.  Keep the anal area dry and clean. Use wet toilet paper or moist towelettes after a bowel movement.  Do not sit on the toilet for long periods of time. This increases blood pooling and pain.  Keep all follow-up visits as told by your health care provider. This is important. Contact a health care provider if you have:  Increasing pain and swelling that are not controlled by treatment or medicine.  Difficulty having a bowel movement, or you are unable to have a bowel movement.  Pain or inflammation outside the area of the hemorrhoids. Get help right away if you have:  Uncontrolled bleeding from your rectum. Summary  Hemorrhoids are swollen veins in and around the rectum or anus.  Most hemorrhoids can be managed with home treatments such as diet and lifestyle changes.  Taking warm sitz baths can help ease pain and discomfort.  In severe cases, procedures or surgery can be done to shrink or remove the hemorrhoids. This information is not intended to replace advice given to you by your health care provider. Make sure you discuss any questions you have with your health care provider. Document Revised: 12/21/2018 Document Reviewed: 12/14/2017 Elsevier Patient Education  [employer].

## 2019-12-23 NOTE — Progress Notes (Signed)
12/23/2019  Reason for Visit:  Internal hemorrhoids  Referring Provider:  Nobie Putnam, DO  History of Present Illness: Keith Bates is a 79 y.o. male presenting for evaluation of bleeding internal hemorrhoids.  The patient reports that he's had issues with hemorrhoids for many years.  He had seen Dr. Jamal Collin in 2014 for the same and this improved with conservative measures.  He had a colonoscopy that only showed a descending colon polyp, but otherwise, no complications from his hemorrhoids.  He more recently was having a flare-up of the internal hemorrhoids which was associated with bleeding.  He denies having any pain with them.  He reports having bowel movements about 2-3 times daily that are soft, and he does not have to strain hard.  He does not do [employer] and does not take any fiber supplement, but he says he eats salad.  He saw his PCP on 5/7 because of hematuria as well as bleeding hemorrhoids.  He was given course of cipro for presumed UTI, and started on Anusol for the hemorrhoids.  Patient reports that he's still having hematuria, but the hemorrhoids are not bleeding anymore.  He stopped using the Anusol to see how things would go.  Past Medical History: Past Medical History:  Diagnosis Date  . Arthritis   . DDD (degenerative disc disease)   . Enlarged prostate   . Environmental allergies   . GERD (gastroesophageal reflux disease)   . Hx of ulcer disease   . Loss of weight   . Nocturia   . Prostate cancer ([place])   . Unspecified hemorrhoids without mention of complication      Past Surgical History: Past Surgical History:  Procedure Laterality Date  . COLONOSCOPY  03/13/2013  . HEMORRHOID BANDING    . LYMPHADENECTOMY Bilateral 06/21/2013   Procedure: LYMPHADENECTOMY  " BILATERAL PELVIC LYMPH NODE DISSECTION ;  Surgeon: Alexis Frock, MD;  Location: [facility];  Service: Urology;  Laterality: Bilateral;  . ROBOT ASSISTED LAPAROSCOPIC RADICAL PROSTATECTOMY N/A 06/21/2013    Procedure: ROBOTIC ASSISTED LAPAROSCOPIC RADICAL PROSTATECTOMY;  Surgeon: Alexis Frock, MD;  Location: [facility];  Service: Urology;  Laterality: N/A;    Home Medications: Prior to Admission medications   Medication Sig Start Date End Date Taking? Authorizing Provider  fluticasone (FLONASE) 50 MCG/ACT nasal spray Place 2 sprays into both nostrils daily. Use for 4-6 weeks then stop and use seasonally or as needed. 11/22/19  Yes Karamalegos, Devonne Doughty, DO  hydrocortisone (ANUSOL-HC) 25 MG suppository Place 1 suppository (25 mg total) rectally 2 (two) times daily as needed for hemorrhoids or anal itching. For 7 days 12/13/19  Yes Karamalegos, Devonne Doughty, DO  meloxicam (MOBIC) 7.5 MG tablet TAKE 1 TABLET (7.5 MG TOTAL) BY MOUTH DAILY AS NEEDED FOR PAIN. UP TO 1-2 WEEKS AS NEEDED 09/09/19  Yes Karamalegos, Devonne Doughty, DO  Multiple Vitamin (MULTIVITAMIN) tablet Take 1 tablet by mouth daily.   Yes [provider]  acetaminophen (TYLENOL) 500 MG tablet Take 1 tablet (500 mg total) by mouth every 6 (six) hours as needed. Patient not taking: Reported on 08/27/2019 04/20/17   Olin Hauser, DO  ciprofloxacin (CIPRO) 500 MG tablet Take 1 tablet (500 mg total) by mouth 2 (two) times daily. For 5 days Patient not taking: Reported on 12/23/2019 12/13/19   Olin Hauser, DO  fexofenadine (ALLEGRA) 180 MG tablet Take 180 mg by mouth daily.    [provider]  flunisolide (NASALIDE) 25 MCG/ACT (0.025%) SOLN Place 2 sprays into  the nose 2 (two) times daily. Please specify directions, refills and quantity Patient not taking: Reported on 12/23/2019 09/09/19   Olin Hauser, DO    Allergies: Allergies  Allergen Reactions  . 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 Plasticized Base [Plastibase] Rash  . Penicillins Rash    Social History:  reports that he quit smoking about 47 years ago. His smoking use included cigarettes. He has a 15.00 pack-year  smoking history. He has quit using smokeless tobacco. He reports that he does not drink alcohol or use drugs.   Family History: Family History  Problem Relation Age of Onset  . Prostate cancer Father        with mets to nodes  . Cancer Brother        colon and prostate  . Heart attack Mother     Review of Systems: Review of Systems  Constitutional: Negative for chills and fever.  HENT: Negative for hearing loss.   Eyes: Negative for blurred vision.  Respiratory: Negative for shortness of breath.   Cardiovascular: Negative for chest pain.  Gastrointestinal: Positive for blood in stool. Negative for abdominal pain, constipation, diarrhea, nausea and vomiting.  Genitourinary: Positive for hematuria.  Musculoskeletal: Negative for myalgias.  Skin: Negative for rash.  Neurological: Negative for dizziness.  Psychiatric/Behavioral: Negative for depression.    Physical Exam BP (!) 148/72   Pulse 67   Temp (!) 91.8 F (33.2 C) (Temporal)   Ht 5' 7.5" (1.715 m)   Wt 162 lb 9.6 oz (73.8 kg)   SpO2 97%   BMI 25.09 kg/m  CONSTITUTIONAL: No acute distress HEENT:  Normocephalic, atraumatic, extraocular motion intact. NECK: Trachea is midline, and there is no jugular venous distension.  RESPIRATORY:  Lungs are clear, and breath sounds are equal bilaterally. Normal respiratory effort without pathologic use of accessory muscles. CARDIOVASCULAR: Heart is regular without murmurs, gallops, or rubs. GI: The abdomen is soft, non-distended, non-tender.  RECTAL:  External exam reveals normal anal verge, without any enlarged external hemorrhoids.  Digital rectal exam reveals enlarged right posterior and left lateral hemorrhoidal columns internally.  Anoscopy was also performed which confirmed these findings and did not show any masses or lesions.  The hemorrhoidal column tissue is not friable.  No gross blood noted. MUSCULOSKELETAL:  Normal muscle strength and tone in all four extremities.  No  peripheral edema or cyanosis. SKIN: Skin turgor is normal. There are no pathologic skin lesions.  NEUROLOGIC:  Motor and sensation is grossly normal.  Cranial nerves are grossly intact. PSYCH:  Alert and oriented to person, place and time. Affect is normal.  Laboratory Analysis: No results found for this or any previous visit (from the past 24 hour(s)).  Imaging: No results found.  Assessment and Plan: This is a 79 y.o. male with recent history of hematuria and bleeding internal hemorrhoids.  --Discussed with the patient that he currently does not have any gross blood in the anal canal.  It seems the Anusol treatment has helped him and the hemorrhoidal tissue does not appear friable at this point.   --I think for now, it would be ok to continue to conservative management.  Discussed with the patient that overall, we need to make sure he continues with regular bowel function, and he should add Benefiber or Metamucil to help keep the stool soft/bulky without any constipation.  Also, he should do Sitz baths after bowel movements to help soothe the hemorrhoidal tissue and  make sure the area is kept clean to prevent any pruritus.  He can use the Anusol as needed for flare-ups.  Also discussed to avoid any straining with bowel movements, and not to be on the toilet for prolonged periods of time, as this can worsen the hemorrhoids.  --Discussed with him as well that if he continues to have bleeding that's not well controlled with this conservative measures, then there are two options for treatment.  We could set up a referral for GI for evaluation for hemorrhoidal banding.  The other option would be to bring him to the OR for EUA and hemorrhoidectomy of the internal columns.  I think banding would be appropriate given that it's only the internal components that are the issue. --Patient will call us as needed.  I did stress with him that he should contact his PCP or his Urologist for further evaluation of  the hematuria, given his history of prostate cancer.  Face-to-face time spent with the patient and care providers was 40 minutes, with more than 50% of the time spent counseling, educating, and coordinating care of the patient.     Melvyn Neth, [facility]

## 2020-03-25 ENCOUNTER — Other Ambulatory Visit: Payer: Self-pay | Admitting: Family Medicine

## 2020-03-25 DIAGNOSIS — J3089 Other allergic rhinitis: Secondary | ICD-10-CM

## 2020-03-25 NOTE — Telephone Encounter (Signed)
Requested Prescriptions  Pending Prescriptions Disp Refills   fluticasone (FLONASE) 50 MCG/ACT nasal spray [Pharmacy Med Name: FLUTICASONE PROP 50 MCG SPRAY] 48 mL 2    Sig: PLACE 2 SPRAYS INTO BOTH NOSTRILS DAILY. USE FOR 4-6 WEEKS THEN STOP AND USE SEASONALLY OR AS NEEDED     Ear, Nose, and Throat: Nasal Preparations - Corticosteroids Passed - 03/25/2020  1:14 AM      Passed - Valid encounter within last 12 months    Recent Outpatient Visits          3 months ago Gross hematuria   [place], DO   11 months ago Annual physical exam   [facility] Olin Hauser, DO   1 year ago [place], Devonne Doughty, DO   1 year ago Annual physical exam   [facility] Olin Hauser, DO   2 years ago [place], Devonne Doughty, DO      Future Appointments            In 1 month [place], Devonne Doughty, [facility]

## 2020-04-24 ENCOUNTER — Other Ambulatory Visit: Payer: Self-pay | Admitting: *Deleted

## 2020-04-24 DIAGNOSIS — R7303 Prediabetes: Secondary | ICD-10-CM

## 2020-04-24 DIAGNOSIS — E7800 Pure hypercholesterolemia, unspecified: Secondary | ICD-10-CM

## 2020-04-24 DIAGNOSIS — Z0000 Encounter for general adult medical examination without abnormal findings: Secondary | ICD-10-CM

## 2020-04-24 DIAGNOSIS — D649 Anemia, unspecified: Secondary | ICD-10-CM

## 2020-04-27 ENCOUNTER — Other Ambulatory Visit: Payer: Medicare Other

## 2020-04-27 ENCOUNTER — Other Ambulatory Visit: Payer: Self-pay

## 2020-04-28 LAB — COMPLETE METABOLIC PANEL WITH GFR
AG Ratio: 1.7 (calc) (ref 1.0–2.5)
ALT: 18 U/L (ref 9–46)
AST: 21 U/L (ref 10–35)
Albumin: 4.1 g/dL (ref 3.6–5.1)
Alkaline phosphatase (APISO): 76 U/L (ref 35–144)
BUN: 15 mg/dL (ref 7–25)
CO2: 26 mmol/L (ref 20–32)
Calcium: 9.4 mg/dL (ref 8.6–10.3)
Chloride: 105 mmol/L (ref 98–110)
Creat: 0.81 mg/dL (ref 0.70–1.18)
GFR, Est African American: 99 mL/min/{1.73_m2} (ref >=60)
GFR, Est Non African American: 85 mL/min/{1.73_m2} (ref >=60)
Globulin: 2.4 g/dL (calc) (ref 1.9–3.7)
Glucose, Bld: 85 mg/dL (ref 65–99)
Potassium: 3.8 mmol/L (ref 3.5–5.3)
Sodium: 139 mmol/L (ref 135–146)
Total Bilirubin: 1 mg/dL (ref 0.2–1.2)
Total Protein: 6.5 g/dL (ref 6.1–8.1)

## 2020-04-28 LAB — LIPID PANEL
Cholesterol: 194 mg/dL (ref <200)
HDL: 65 mg/dL (ref >=40)
LDL Cholesterol (Calc): 113 mg/dL (calc) — ABNORMAL HIGH
Non-HDL Cholesterol (Calc): 129 mg/dL (calc) (ref <130)
Total CHOL/HDL Ratio: 3 (calc) (ref <5.0)
Triglycerides: 70 mg/dL (ref <150)

## 2020-04-28 LAB — CBC WITH DIFFERENTIAL/PLATELET
Absolute Monocytes: 488 cells/uL (ref 200–950)
Basophils Absolute: 33 cells/uL (ref 0–200)
Basophils Relative: 0.5 %
Eosinophils Absolute: 117 cells/uL (ref 15–500)
Eosinophils Relative: 1.8 %
HCT: 43.5 % (ref 38.5–50.0)
Hemoglobin: 14 g/dL (ref 13.2–17.1)
Lymphs Abs: 1937 cells/uL (ref 850–3900)
MCH: 30 pg (ref 27.0–33.0)
MCHC: 32.2 g/dL (ref 32.0–36.0)
MCV: 93.3 fL (ref 80.0–100.0)
MPV: 10 fL (ref 7.5–12.5)
Monocytes Relative: 7.5 %
Neutro Abs: 3926 cells/uL (ref 1500–7800)
Neutrophils Relative %: 60.4 %
Platelets: 213 10*3/uL (ref 140–400)
RBC: 4.66 10*6/uL (ref 4.20–5.80)
RDW: 13.4 % (ref 11.0–15.0)
Total Lymphocyte: 29.8 %
WBC: 6.5 10*3/uL (ref 3.8–10.8)

## 2020-04-28 LAB — HEMOGLOBIN A1C
Hgb A1c MFr Bld: 6.2 % of total Hgb — ABNORMAL HIGH (ref <5.7)
Mean Plasma Glucose: 131 (calc)
eAG (mmol/L): 7.3 (calc)

## 2020-05-04 ENCOUNTER — Encounter: Payer: Medicare Other | Admitting: Family Medicine

## 2020-05-05 ENCOUNTER — Encounter: Payer: Self-pay | Admitting: Family Medicine

## 2020-05-05 ENCOUNTER — Other Ambulatory Visit: Payer: Self-pay

## 2020-05-05 ENCOUNTER — Ambulatory Visit (INDEPENDENT_AMBULATORY_CARE_PROVIDER_SITE_OTHER): Payer: Medicare Other | Admitting: Family Medicine

## 2020-05-05 VITALS — BP 135/74 | HR 104 | Temp 96.8°F | Resp 16 | Ht 67.5 in | Wt 155.6 lb

## 2020-05-05 DIAGNOSIS — Z0000 Encounter for general adult medical examination without abnormal findings: Secondary | ICD-10-CM

## 2020-05-05 DIAGNOSIS — R7303 Prediabetes: Secondary | ICD-10-CM

## 2020-05-05 DIAGNOSIS — D649 Anemia, unspecified: Secondary | ICD-10-CM | POA: Diagnosis not present

## 2020-05-05 DIAGNOSIS — E7800 Pure hypercholesterolemia, unspecified: Secondary | ICD-10-CM

## 2020-05-05 DIAGNOSIS — M150 Primary generalized (osteo)arthritis: Secondary | ICD-10-CM

## 2020-05-05 DIAGNOSIS — Z23 Encounter for immunization: Secondary | ICD-10-CM | POA: Diagnosis not present

## 2020-05-05 DIAGNOSIS — M8949 Other hypertrophic osteoarthropathy, multiple sites: Secondary | ICD-10-CM

## 2020-05-05 DIAGNOSIS — M159 Polyosteoarthritis, unspecified: Secondary | ICD-10-CM

## 2020-05-05 NOTE — Assessment & Plan Note (Addendum)
Stable with intermittent joint pain flares  Plan: 1. Continue regular dosing of Tylenol Ext Str 500-1000mg  TID 2. May continue oral NSAID Meloxicam 7.5mg  PRN only 3. Re-consider topical NSAID diclofenac at future visit if needed 4. RICE therapy 5. Follow-up in future if flares, can consider X-ray - can refer to Ortho vs Neuro if need, future may need carpal tunnel inj

## 2020-05-05 NOTE — Assessment & Plan Note (Addendum)
Controlled cholesterol on lifestyle Last lipid panel 04/2020 The 10-year ASCVD risk score [name] DC Jr., et al., 2013) is: 15.2%   Plan: 1. Discussed ASCVD risk reduction strategies - decline statin at this time 2. Continue ASA 81mg  for primary ASCVD risk reduction 3. Restart  OTC supplement Red Rice Yeast 4. Encourage improved lifestyle - low carb/cholesterol, reduce portion size, continue improving regular exercise

## 2020-05-05 NOTE — Assessment & Plan Note (Signed)
A1c stable at 6.2, prior 6.3 Improved lifestyle  Plan:  1. Not on any therapy currently  2. Encourage improved lifestyle - low carb, low sugar diet, reduce portion size, continue improving regular exercise as discussed

## 2020-05-05 NOTE — Patient Instructions (Addendum)
Thank you for coming to the office today.  Due for 3rd dose Booster Pfizer after 8 months after your 2nd dose vaccine. At pharmacy when ready.  Flu shot today  Please schedule a Follow-up Appointment to: Return in about 6 months (around 11/02/2020) for 6 month PreDM A1c.  If you have any other questions or concerns, please feel free to call the office or send a message through [place]. You may also schedule an earlier appointment if necessary.  Additionally, you may be receiving a survey about your experience at our office within a few days to 1 week by e-mail or mail. We value your feedback.  Nobie Putnam, DO [place]

## 2020-05-05 NOTE — Progress Notes (Signed)
Subjective:    Patient ID: Keith Bates, male    DOB: June 03, 1941, 79 y.o.   MRN: [id number]  Keith Bates is a 79 y.o. male presenting on 05/05/2020 for Annual Exam   HPI   Here for Annual Physical and Lab Review.  Pre-Diabetes: Reports no concerns. Last A1c 6.3 down to 6.2 again similar trend is stable. 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 - Today doing well. Still takingMeloxicam 7.5mg  PRN, not taking regularly Last lab showed normal Creatinine. - Also takes Tylenol, uses copper compression gloves - Pain can be increased repetitive activityand yardwork - Other joints with pain aching and stiffness at times, if more active, including knees  HYPERLIPIDEMIA: - Reports no concerns. Last lipid panel 04/2020, mild elevated LDL but improved from last result. - Currently taking Red Yeast Rice, tolerating well without side effects or myalgias - Decline statin  Additional updates  No further gross hematuria He has seen Gen Surgery for hemorrhoids  Health Maintenance:  Due for Flu Shot, will receive today   UTD PNA vaccine series  Prostate Cancer - Followed by Dr Linna Hoff robotic prostatectomy 06/2013 - No new concerns. Urology follows PSA  Shingles vaccine was too high cost for him.   Depression screen [facility] 2/9 05/05/2020 12/13/2019 08/27/2019  Decreased Interest 0 0 0  Down, Depressed, Hopeless 0 0 0  PHQ - 2 Score 0 0 0  Altered sleeping - - -  Tired, decreased energy - - -  Change in appetite - - -  Feeling bad or failure about yourself  - - -  Trouble concentrating - - -  Moving slowly or fidgety/restless - - -  Suicidal thoughts - - -  PHQ-9  Score - - -  Difficult doing work/chores - - -    Past Medical History:  Diagnosis Date  . Arthritis   . DDD (degenerative disc disease)   . Enlarged prostate   . Environmental allergies   . GERD (gastroesophageal reflux disease)   . Hx of ulcer disease   . Loss of weight   . Nocturia   . Prostate cancer ([place])   . Unspecified hemorrhoids without mention of complication    Past Surgical History:  Procedure Laterality Date  . COLONOSCOPY  03/13/2013  . HEMORRHOID BANDING    . LYMPHADENECTOMY Bilateral 06/21/2013   Procedure: LYMPHADENECTOMY  " BILATERAL PELVIC LYMPH NODE DISSECTION ;  Surgeon: Alexis Frock, MD;  Location: [facility];  Service: Urology;  Laterality: Bilateral;  . ROBOT ASSISTED LAPAROSCOPIC RADICAL PROSTATECTOMY N/A 06/21/2013   Procedure: ROBOTIC ASSISTED LAPAROSCOPIC RADICAL PROSTATECTOMY;  Surgeon: Alexis Frock, MD;  Location: [facility];  Service: Urology;  Laterality: N/A;   Social History   Socioeconomic History  . Marital status: Married    Spouse name: Not on file  . Number of children: Not on file  . Years of education: Not on file  . Highest education level: Not on file  Occupational History  . Not on file  Tobacco Use  . Smoking status: Former Smoker    Packs/day: 1.50    Years: 10.00    Pack years: 15.00    Types: Cigarettes    Quit date: 06/06/1972    Years since quitting: 47.9  . Smokeless  tobacco: Former Network engineer and Sexual Activity  . Alcohol use: No  . Drug use: No  . Sexual activity: Not Currently  Other Topics Concern  . Not on file  Social History Narrative  . Not on file   Social Determinants of Health   Financial Resource Strain:   . Difficulty of Paying Living Expenses: Not on file  Food Insecurity:   . Worried About Charity fundraiser in the Last Year: Not on file  . Ran Out of Food in the Last Year: Not on file  Transportation Needs:   . Lack of Transportation (Medical): Not on file  . Lack of Transportation  (Non-Medical): Not on file  Physical Activity:   . Days of Exercise per Week: Not on file  . Minutes of Exercise per Session: Not on file  Stress:   . Feeling of Stress : Not on file  Social Connections:   . Frequency of Communication with Friends and Family: Not on file  . Frequency of Social Gatherings with Friends and Family: Not on file  . Attends Religious Services: Not on file  . Active Member of Clubs or Organizations: Not on file  . Attends Archivist Meetings: Not on file  . Marital Status: Not on file  Intimate Partner Violence:   . Fear of Current or Ex-Partner: Not on file  . Emotionally Abused: Not on file  . Physically Abused: Not on file  . Sexually Abused: Not on file   Family History  Problem Relation Age of Onset  . Prostate cancer Father        with mets to nodes  . Cancer Brother        colon and prostate  . Heart attack Mother    Current Outpatient Medications on File Prior to Visit  Medication Sig  . acetaminophen (TYLENOL) 500 MG tablet Take 1 tablet (500 mg total) by mouth every 6 (six) hours as needed.  . fluticasone (FLONASE) 50 MCG/ACT nasal spray PLACE 2 SPRAYS INTO BOTH NOSTRILS DAILY. USE FOR 4-6 WEEKS THEN STOP AND USE SEASONALLY OR AS NEEDED  . hydrocortisone (ANUSOL-HC) 25 MG suppository Place 1 suppository (25 mg total) rectally 2 (two) times daily as needed for hemorrhoids or anal itching. For 7 days  . meloxicam (MOBIC) 7.5 MG tablet TAKE 1 TABLET (7.5 MG TOTAL) BY MOUTH DAILY AS NEEDED FOR PAIN. UP TO 1-2 WEEKS AS NEEDED  . Multiple Vitamin (MULTIVITAMIN) tablet Take 1 tablet by mouth daily.  . Red Yeast Rice Extract (RED YEAST RICE PO) Take by mouth.   No current facility-administered medications on file prior to visit.    Review of Systems  Constitutional: Negative for activity change, appetite change, chills, diaphoresis, fatigue and fever.  HENT: Negative for congestion and hearing loss.   Eyes: Negative for visual  disturbance.  Respiratory: Negative for apnea, cough, chest tightness, shortness of breath and wheezing.   Cardiovascular: Negative for chest pain, palpitations and leg swelling.  Gastrointestinal: Negative for abdominal pain, anal bleeding, blood in stool, constipation, diarrhea, nausea and vomiting.  Endocrine: Negative for cold intolerance.  Genitourinary: Negative for difficulty urinating, dysuria, frequency and hematuria.  Musculoskeletal: Negative for arthralgias, back pain and neck pain.  Skin: Negative for rash.  Allergic/Immunologic: Negative for environmental allergies.  Neurological: Negative for dizziness, weakness, light-headedness, numbness and headaches.  Hematological: Negative for adenopathy.  Psychiatric/Behavioral: Negative for behavioral problems, dysphoric mood and sleep disturbance. The patient is not nervous/anxious.    Per  HPI unless specifically indicated above     Objective:    BP 135/74   Pulse (!) 104   Temp (!) 96.8 F (36 C) (Temporal)   Resp 16   Ht 5' 7.5" (1.715 m)   Wt 155 lb 9.6 oz (70.6 kg)   SpO2 97%   BMI 24.01 kg/m   Wt Readings from Last 3 Encounters:  05/05/20 155 lb 9.6 oz (70.6 kg)  12/23/19 162 lb 9.6 oz (73.8 kg)  12/13/19 165 lb (74.8 kg)    Physical Exam Vitals and nursing note reviewed.  Constitutional:      General: He is not in acute distress.    Appearance: He is well-developed. He is not diaphoretic.     Comments: Well-appearing, comfortable, cooperative  HENT:     Head: Normocephalic and atraumatic.  Eyes:     General:        Right eye: No discharge.        Left eye: No discharge.     Conjunctiva/sclera: Conjunctivae normal.     Pupils: Pupils are equal, round, and reactive to light.  Neck:     Thyroid: No thyromegaly.  Cardiovascular:     Rate and Rhythm: Normal rate and regular rhythm.     Heart sounds: Normal heart sounds. No murmur heard.   Pulmonary:     Effort: Pulmonary effort is normal. No respiratory  distress.     Breath sounds: Normal breath sounds. No wheezing or rales.  Abdominal:     General: Bowel sounds are normal. There is no distension.     Palpations: Abdomen is soft. There is no mass.     Tenderness: There is no abdominal tenderness.  Musculoskeletal:        General: No tenderness. Normal range of motion.     Cervical back: Normal range of motion and neck supple.     Comments: Upper / Lower Extremities: - Normal muscle tone, strength bilateral upper extremities 5/5, lower extremities 5/5  Lymphadenopathy:     Cervical: No cervical adenopathy.  Skin:    General: Skin is warm and dry.     Findings: No erythema or rash.  Neurological:     Mental Status: He is alert and oriented to person, place, and time.     Comments: Distal sensation intact to light touch all extremities  Psychiatric:        Behavior: Behavior normal.     Comments: Well groomed, good eye contact, normal speech and thoughts       Results for orders placed or performed in visit on 04/24/20  Lipid panel  Result Value Ref Range   Cholesterol 194 <200 mg/dL   HDL 65 > OR = 40 mg/dL   Triglycerides 70 <150 mg/dL   LDL Cholesterol (Calc) 113 (H) mg/dL (calc)   Total CHOL/HDL Ratio 3.0 <5.0 (calc)   Non-HDL Cholesterol (Calc) 129 <130 mg/dL (calc)  COMPLETE METABOLIC PANEL WITH GFR  Result Value Ref Range   Glucose, Bld 85 65 - 99 mg/dL   BUN 15 7 - 25 mg/dL   Creat 0.81 0.70 - 1.18 mg/dL   GFR, Est Non African American 85 > OR = 60 mL/min/1.33m2   GFR, Est African American 99 > OR = 60 mL/min/1.60m2   BUN/Creatinine Ratio NOT APPLICABLE 6 - 22 (calc)   Sodium 139 135 - 146 mmol/L   Potassium 3.8 3.5 - 5.3 mmol/L   Chloride 105 98 - 110 mmol/L   CO2 26 20 -  32 mmol/L   Calcium 9.4 8.6 - 10.3 mg/dL   Total Protein 6.5 6.1 - 8.1 g/dL   Albumin 4.1 3.6 - 5.1 g/dL   Globulin 2.4 1.9 - 3.7 g/dL (calc)   AG Ratio 1.7 1.0 - 2.5 (calc)   Total Bilirubin 1.0 0.2 - 1.2 mg/dL   Alkaline phosphatase  (APISO) 76 35 - 144 U/L   AST 21 10 - 35 U/L   ALT 18 9 - 46 U/L  CBC with Differential/Platelet  Result Value Ref Range   WBC 6.5 3.8 - 10.8 Thousand/uL   RBC 4.66 4.20 - 5.80 Million/uL   Hemoglobin 14.0 13.2 - 17.1 g/dL   HCT 43.5 38 - 50 %   MCV 93.3 80.0 - 100.0 fL   MCH 30.0 27.0 - 33.0 pg   MCHC 32.2 32.0 - 36.0 g/dL   RDW 13.4 11.0 - 15.0 %   Platelets 213 140 - 400 Thousand/uL   MPV 10.0 7.5 - 12.5 fL   Neutro Abs 3,926 1,500 - 7,800 cells/uL   Lymphs Abs 1,937 850 - 3,900 cells/uL   Absolute Monocytes 488 200 - 950 cells/uL   Eosinophils Absolute 117 15 - 500 cells/uL   Basophils Absolute 33 0 - 200 cells/uL   Neutrophils Relative % 60.4 %   Total Lymphocyte 29.8 %   Monocytes Relative 7.5 %   Eosinophils Relative 1.8 %   Basophils Relative 0.5 %  Hemoglobin A1c  Result Value Ref Range   Hgb A1c MFr Bld 6.2 (H) <5.7 % of total Hgb   Mean Plasma Glucose 131 (calc)   eAG (mmol/L) 7.3 (calc)      Assessment & Plan:   Problem List Items Addressed This Visit    Primary osteoarthritis involving multiple joints    Stable with intermittent joint pain flares  Plan: 1. Continue regular dosing of Tylenol Ext Str 500-1000mg  TID 2. May continue oral NSAID Meloxicam 7.5mg  PRN only 3. Re-consider topical NSAID diclofenac at future visit if needed 4. RICE therapy 5. Follow-up in future if flares, can consider X-ray - can refer to Ortho vs Neuro if need, future may need carpal tunnel inj      Pre-diabetes    A1c stable at 6.2, prior 6.3 Improved lifestyle  Plan:  1. Not on any therapy currently  2. Encourage improved lifestyle - low carb, low sugar diet, reduce portion size, continue improving regular exercise as discussed      Normocytic anemia    Normalized Hgb      Hyperlipidemia    Controlled cholesterol on lifestyle Last lipid panel 04/2020 The 10-year ASCVD risk score [name] DC Jr., et al., 2013) is: 15.2%   Plan: 1. Discussed ASCVD risk reduction  strategies - decline statin at this time 2. Continue ASA 81mg  for primary ASCVD risk reduction 3. Restart  OTC supplement Red Rice Yeast 4. Encourage improved lifestyle - low carb/cholesterol, reduce portion size, continue improving regular exercise       Other Visit Diagnoses    Annual physical exam    -  Primary   Needs flu shot       Relevant Orders   Flu Vaccine QUAD High Dose(Fluad) (Completed)     Updated Health Maintenance information - Flu shot today - UTD COVID, discussed booster Reviewed recent lab results with patient Encouraged improvement to lifestyle with diet and exercise Goal healthy weight   No orders of the defined types were placed in this encounter.    Follow up  plan: Return in about 6 months (around 11/02/2020) for 6 month PreDM A1c.  Nobie Putnam, [facility] 05/05/2020, 9:41 AM

## 2020-05-05 NOTE — Assessment & Plan Note (Signed)
Normalized Hgb

## 2020-09-01 ENCOUNTER — Ambulatory Visit (INDEPENDENT_AMBULATORY_CARE_PROVIDER_SITE_OTHER): Payer: Medicare Other

## 2020-09-01 VITALS — Ht 67.0 in | Wt 155.0 lb

## 2020-09-01 DIAGNOSIS — Z0000 Encounter for general adult medical examination without abnormal findings: Secondary | ICD-10-CM

## 2020-09-01 NOTE — Patient Instructions (Signed)
Keith Bates , Thank you for taking time to come for your Medicare Wellness Visit. I appreciate your ongoing commitment to your health goals. Please review the following plan we discussed and let me know if I can assist you in the future.   Screening recommendations/referrals: Colonoscopy: not required Recommended yearly ophthalmology/optometry visit for glaucoma screening and checkup Recommended yearly dental visit for hygiene and checkup  Vaccinations: Influenza vaccine: completed 05/05/2020, due 03/08/2021 Pneumococcal vaccine: completed 06/13/2014 Tdap vaccine: completed 03/29/2013, due 03/30/2023 Shingles vaccine: discussed   Covid-19:  07/14/2020, 09/13/2019, 08/23/2019  Advanced directives: Advance directive discussed with you today.  Conditions/risks identified: none  Next appointment: Follow up in one year for your annual wellness visit.   Preventive Care 19 Years and Older, Male Preventive care refers to lifestyle choices and visits with your health care provider that can promote health and wellness. What does preventive care include?  A yearly physical exam. This is also called an annual well check.  Dental exams once or twice a year.  Routine eye exams. Ask your health care provider how often you should have your eyes checked.  Personal lifestyle choices, including:  Daily care of your teeth and gums.  Regular physical activity.  Eating a healthy diet.  Avoiding tobacco and drug use.  Limiting alcohol use.  Practicing safe sex.  Taking low doses of aspirin every day.  Taking vitamin and mineral supplements as recommended by your health care provider. What happens during an annual well check? The services and screenings done by your health care provider during your annual well check will depend on your age, overall health, lifestyle risk factors, and family history of disease. Counseling  Your health care provider may ask you questions about your:  Alcohol  use.  Tobacco use.  Drug use.  Emotional well-being.  Home and relationship well-being.  Sexual activity.  Eating habits.  History of falls.  Memory and ability to understand (cognition).  Work and work Statistician. Screening  You may have the following tests or measurements:  Height, weight, and BMI.  Blood pressure.  Lipid and cholesterol levels. These may be checked every 5 years, or more frequently if you are over 35 years old.  Skin check.  Lung cancer screening. You may have this screening every year starting at age 39 if you have a 30-pack-year history of smoking and currently smoke or have quit within the past 15 years.  Fecal occult blood test (FOBT) of the stool. You may have this test every year starting at age 5.  Flexible sigmoidoscopy or colonoscopy. You may have a sigmoidoscopy every 5 years or a colonoscopy every 10 years starting at age 54.  Prostate cancer screening. Recommendations will vary depending on your family history and other risks.  Hepatitis C blood test.  Hepatitis B blood test.  Sexually transmitted disease (STD) testing.  Diabetes screening. This is done by checking your blood sugar (glucose) after you have not eaten for a while (fasting). You may have this done every 1-3 years.  Abdominal aortic aneurysm (AAA) screening. You may need this if you are a current or former smoker.  Osteoporosis. You may be screened starting at age 75 if you are at high risk. Talk with your health care provider about your test results, treatment options, and if necessary, the need for more tests. Vaccines  Your health care provider may recommend certain vaccines, such as:  Influenza vaccine. This is recommended every year.  Tetanus, diphtheria, and acellular pertussis (Tdap, Td)  vaccine. You may need a Td booster every 10 years.  Zoster vaccine. You may need this after age 57.  Pneumococcal 13-valent conjugate (PCV13) vaccine. One dose is  recommended after age 61.  Pneumococcal polysaccharide (PPSV23) vaccine. One dose is recommended after age 8. Talk to your health care provider about which screenings and vaccines you need and how often you need them. This information is not intended to replace advice given to you by your health care provider. Make sure you discuss any questions you have with your health care provider. Document Released: 08/21/2015 Document Revised: 04/13/2016 Document Reviewed: 05/26/2015 Elsevier Interactive Patient Education  2017 [place] Prevention in the Home Falls can cause injuries. They can happen to people of all ages. There are many things you can do to make your home safe and to help prevent falls. What can I do on the outside of my home?  Regularly fix the edges of walkways and driveways and fix any cracks.  Remove anything that might make you trip as you walk through a door, such as a raised step or threshold.  Trim any bushes or trees on the path to your home.  Use bright outdoor lighting.  Clear any walking paths of anything that might make someone trip, such as rocks or tools.  Regularly check to see if handrails are loose or broken. Make sure that both sides of any steps have handrails.  Any raised decks and porches should have guardrails on the edges.  Have any leaves, snow, or ice cleared regularly.  Use sand or salt on walking paths during winter.  Clean up any spills in your garage right away. This includes oil or grease spills. What can I do in the bathroom?  Use night lights.  Install grab bars by the toilet and in the tub and shower. Do not use towel bars as grab bars.  Use non-skid mats or decals in the tub or shower.  If you need to sit down in the shower, use a plastic, non-slip stool.  Keep the floor dry. Clean up any water that spills on the floor as soon as it happens.  Remove soap buildup in the tub or shower regularly.  Attach bath mats  securely with double-sided non-slip rug tape.  Do not have throw rugs and other things on the floor that can make you trip. What can I do in the bedroom?  Use night lights.  Make sure that you have a light by your bed that is easy to reach.  Do not use any sheets or blankets that are too big for your bed. They should not hang down onto the floor.  Have a firm chair that has side arms. You can use this for support while you get dressed.  Do not have throw rugs and other things on the floor that can make you trip. What can I do in the kitchen?  Clean up any spills right away.  Avoid walking on wet floors.  Keep items that you use a lot in easy-to-reach places.  If you need to reach something above you, use a strong step stool that has a grab bar.  Keep electrical cords out of the way.  Do not use floor polish or wax that makes floors slippery. If you must use wax, use non-skid floor wax.  Do not have throw rugs and other things on the floor that can make you trip. What can I do with my stairs?  Do not leave  any items on the stairs.  Make sure that there are handrails on both sides of the stairs and use them. Fix handrails that are broken or loose. Make sure that handrails are as long as the stairways.  Check any carpeting to make sure that it is firmly attached to the stairs. Fix any carpet that is loose or worn.  Avoid having throw rugs at the top or bottom of the stairs. If you do have throw rugs, attach them to the floor with carpet tape.  Make sure that you have a light switch at the top of the stairs and the bottom of the stairs. If you do not have them, ask someone to add them for you. What else can I do to help prevent falls?  Wear shoes that:  Do not have high heels.  Have rubber bottoms.  Are comfortable and fit you well.  Are closed at the toe. Do not wear sandals.  If you use a stepladder:  Make sure that it is fully opened. Do not climb a closed  stepladder.  Make sure that both sides of the stepladder are locked into place.  Ask someone to hold it for you, if possible.  Clearly mark and make sure that you can see:  Any grab bars or handrails.  First and last steps.  Where the edge of each step is.  Use tools that help you move around (mobility aids) if they are needed. These include:  Canes.  Walkers.  Scooters.  Crutches.  Turn on the lights when you go into a dark area. Replace any light bulbs as soon as they burn out.  Set up your furniture so you have a clear path. Avoid moving your furniture around.  If any of your floors are uneven, fix them.  If there are any pets around you, be aware of where they are.  Review your medicines with your doctor. Some medicines can make you feel dizzy. This can increase your chance of falling. Ask your doctor what other things that you can do to help prevent falls. This information is not intended to replace advice given to you by your health care provider. Make sure you discuss any questions you have with your health care provider. Document Released: 05/21/2009 Document Revised: 12/31/2015 Document Reviewed: 08/29/2014 Elsevier Interactive Patient Education  2017 [employer].

## 2020-09-01 NOTE — Progress Notes (Signed)
I connected with Keith Bates today by telephone and verified that I am speaking with the correct person using two identifiers. Location patient: home Location provider: work Persons participating in the virtual visit: Keith Bates, Glenna Durand LPN.   I discussed the limitations, risks, security and privacy concerns of performing an evaluation and management service by telephone and the availability of in person appointments. I also discussed with the patient that there may be a patient responsible charge related to this service. The patient expressed understanding and verbally consented to this telephonic visit.    Interactive audio and video telecommunications were attempted between this provider and patient, however failed, due to patient having technical difficulties OR patient did not have access to video capability.  We continued and completed visit with audio only.     Vital signs may be patient reported or missing  Subjective:   Keith Bates is a 80 y.o. male who presents for Medicare Annual/Subsequent preventive examination.  Review of Systems     Cardiac Risk Factors include: advanced age (>55men, >47 women);male gender     Objective:    Today's Vitals   09/01/20 0912  Weight: 155 lb (70.3 kg)  Height: 5\' 7"  (1.702 m)   Body mass index is 24.28 kg/m.  Advanced Directives 09/01/2020 08/27/2019 07/23/2013 06/21/2013 06/17/2013  Does Patient Have a Medical Advance Directive? No No Patient does not have advance directive;Patient would not like information Patient does not have advance directive Patient does not have advance directive  Pre-existing out of facility DNR order (yellow form or pink MOST form) - - - No -    Current Medications (verified) Outpatient Encounter Medications as of 09/01/2020  Medication Sig  . fluticasone (FLONASE) 50 MCG/ACT nasal spray PLACE 2 SPRAYS INTO BOTH NOSTRILS DAILY. USE FOR 4-6 WEEKS THEN STOP AND USE SEASONALLY OR AS NEEDED  .  hydrocortisone (ANUSOL-HC) 25 MG suppository Place 1 suppository (25 mg total) rectally 2 (two) times daily as needed for hemorrhoids or anal itching. For 7 days  . meloxicam (MOBIC) 7.5 MG tablet TAKE 1 TABLET (7.5 MG TOTAL) BY MOUTH DAILY AS NEEDED FOR PAIN. UP TO 1-2 WEEKS AS NEEDED  . Multiple Vitamin (MULTIVITAMIN) tablet Take 1 tablet by mouth daily.  Marland Kitchen acetaminophen (TYLENOL) 500 MG tablet Take 1 tablet (500 mg total) by mouth every 6 (six) hours as needed. (Patient not taking: Reported on 09/01/2020)  . Red Yeast Rice Extract (RED YEAST RICE PO) Take by mouth. (Patient not taking: Reported on 09/01/2020)   No facility-administered encounter medications on file as of 09/01/2020.    Allergies (verified) Other, Plasticized base [plastibase], and Penicillins   History: Past Medical History:  Diagnosis Date  . Arthritis   . DDD (degenerative disc disease)   . Enlarged prostate   . Environmental allergies   . GERD (gastroesophageal reflux disease)   . Hx of ulcer disease   . Loss of weight   . Nocturia   . Prostate cancer ([place])   . Unspecified hemorrhoids without mention of complication    Past Surgical History:  Procedure Laterality Date  . COLONOSCOPY  03/13/2013  . HEMORRHOID BANDING    . LYMPHADENECTOMY Bilateral 06/21/2013   Procedure: LYMPHADENECTOMY  " BILATERAL PELVIC LYMPH NODE DISSECTION ;  Surgeon: Alexis Frock, MD;  Location: [facility];  Service: Urology;  Laterality: Bilateral;  . ROBOT ASSISTED LAPAROSCOPIC RADICAL PROSTATECTOMY N/A 06/21/2013   Procedure: ROBOTIC ASSISTED LAPAROSCOPIC RADICAL PROSTATECTOMY;  Surgeon: Alexis Frock, MD;  Location: [facility];  Service: Urology;  Laterality: N/A;   Family History  Problem Relation Age of Onset  . Prostate cancer Father        with mets to nodes  . Cancer Brother        colon and prostate  . Heart attack Mother    Social History   Socioeconomic History  . Marital status: Married    Spouse name: Not on file  .  Number of children: Not on file  . Years of education: Not on file  . Highest education level: Not on file  Occupational History  . Occupation: retired  Tobacco Use  . Smoking status: Former Smoker    Packs/day: 1.50    Years: 10.00    Pack years: 15.00    Types: Cigarettes    Quit date: 06/06/1972    Years since quitting: 48.2  . Smokeless tobacco: Former Clinical biochemist  . Vaping Use: Former  Substance and Sexual Activity  . Alcohol use: No  . Drug use: No  . Sexual activity: Not Currently  Other Topics Concern  . Not on file  Social History Narrative  . Not on file   Social Determinants of Health   Financial Resource Strain: Low Risk   . Difficulty of Paying Living Expenses: Not hard at all  Food Insecurity: No Food Insecurity  . Worried About Programme researcher, broadcasting/film/video in the Last Year: Never true  . Ran Out of Food in the Last Year: Never true  Transportation Needs: No Transportation Needs  . Lack of Transportation (Medical): No  . Lack of Transportation (Non-Medical): No  Physical Activity: Insufficiently Active  . Days of Exercise per Week: 7 days  . Minutes of Exercise per Session: 20 min  Stress: No Stress Concern Present  . Feeling of Stress : Not at all  Social Connections: Not on file    Tobacco Counseling Counseling given: Not Answered   Clinical Intake:  Pre-visit preparation completed: Yes  Pain : No/denies pain     Nutritional Status: BMI of 19-24  Normal Nutritional Risks: None Diabetes: No  How often do you need to have someone help you when you read instructions, pamphlets, or other written materials from your doctor or pharmacy?: 1 - Never What is the last grade level you completed in school?: 14 yrs  Diabetic? no  Interpreter Needed?: No  Information entered by :: NAllen LPN   Activities of Daily Living In your present state of health, do you have any difficulty performing the following activities: 09/01/2020  Hearing? Y  Comment  some times  Vision? N  Difficulty concentrating or making decisions? Y  Comment sometimes recall  Walking or climbing stairs? Y  Comment sometimes due to knees  Dressing or bathing? N  Doing errands, shopping? N  Preparing Food and eating ? N  Using the Toilet? N  In the past six months, have you accidently leaked urine? Y  Comment very little  Do you have problems with loss of bowel control? N  Managing your Medications? N  Managing your Finances? N  Housekeeping or managing your Housekeeping? N  Some recent data might be hidden    Patient Care Team: Smitty Cords, DO as PCP - General (Family Medicine) Kieth Brightly, MD (General Surgery)  Indicate any recent Medical Services you may have received from other than [facility] providers in the past year (date may be approximate).     Assessment:   This is a routine wellness  examination for [facility].  Hearing/Vision screen  Hearing Screening   125Hz  250Hz  500Hz  1000Hz  2000Hz  3000Hz  4000Hz  6000Hz  8000Hz   Right ear:           Left ear:           Vision Screening Comments: No regular eye exams  Dietary issues and exercise activities discussed: Current Exercise Habits: Home exercise routine, Type of exercise: stretching, Time (Minutes): 20, Frequency (Times/Week): 7, Weekly Exercise (Minutes/Week): 140  Goals    . Patient Stated     09/01/2020, stay healthy      Depression Screen PHQ 2/9 Scores 09/01/2020 05/05/2020 12/13/2019 08/27/2019 04/30/2019 10/19/2018 04/20/2018  PHQ - 2 Score 0 0 0 0 1 0 0  PHQ- 9 Score - - - - 2 - -    Fall Risk Fall Risk  09/01/2020 05/05/2020 12/23/2019 12/13/2019 08/27/2019  Falls in the past year? 0 0 0 0 0  Number falls in past yr: - 0 0 0 0  Injury with Fall? - 0 0 0 0  Risk for fall due to : Medication side effect - - - -  Follow up Falls evaluation completed;Education provided;Falls prevention discussed Falls evaluation completed - Falls evaluation completed -    FALL RISK PREVENTION  PERTAINING TO THE HOME:  Any stairs in or around the home? Yes  If so, are there any without handrails? No  Home free of loose throw rugs in walkways, pet beds, electrical cords, etc? Yes  Adequate lighting in your home to reduce risk of falls? Yes   ASSISTIVE DEVICES UTILIZED TO PREVENT FALLS:  Life alert? No  Use of a cane, walker or w/c? No  Grab bars in the bathroom? Yes  Shower chair or bench in shower? No  Elevated toilet seat or a handicapped toilet? Yes   TIMED UP AND GO:  Was the test performed? No   Cognitive Function:     6CIT Screen 09/01/2020  What Year? 0 points  What month? 0 points  What time? 0 points  Count back from 20 2 points  Months in reverse 0 points  Repeat phrase 6 points  Total Score 8    Immunizations Immunization History  Administered Date(s) Administered  . Fluad Quad(high Dose 65+) 05/05/2020  . Influenza, High Dose Seasonal PF 04/20/2017, 03/28/2019  . Influenza-Unspecified 03/30/2019  . PFIZER(Purple Top)SARS-COV-2 Vaccination 08/23/2019, 09/13/2019, 07/14/2020    TDAP status: Up to date  Flu Vaccine status: Up to date  Pneumococcal vaccine status: Up to date  Covid-19 vaccine status: Completed vaccines  Qualifies for Shingles Vaccine? Yes   Zostavax completed No   Shingrix Completed?: No.    Education has been provided regarding the importance of this vaccine. Patient has been advised to call insurance company to determine out of pocket expense if they have not yet received this vaccine. Advised may also receive vaccine at local pharmacy or Health Dept. Verbalized acceptance and understanding.  Screening Tests Health Maintenance  Topic Date Due  . Hepatitis C Screening  Never done  . TETANUS/TDAP  03/30/2023  . INFLUENZA VACCINE  Completed  . COVID-19 Vaccine  Completed  . PNA vac Low Risk Adult  Completed    Health Maintenance  Health Maintenance Due  Topic Date Due  . Hepatitis C Screening  Never done     Colorectal cancer screening: No longer required.   Lung Cancer Screening: (Low Dose CT Chest recommended if Age 74-80 years, 30 pack-year currently smoking OR have quit w/in 15years.) does  not qualify.   Lung Cancer Screening Referral: no  Additional Screening:  Hepatitis C Screening: does qualify; due  Vision Screening: Recommended annual ophthalmology exams for early detection of glaucoma and other disorders of the eye. Is the patient up to date with their annual eye exam?  No  Who is the provider or what is the name of the office in which the patient attends annual eye exams? Dr. Ellin Mayhew If pt is not established with a provider, would they like to be referred to a provider to establish care? No .   Dental Screening: Recommended annual dental exams for proper oral hygiene  Community Resource Referral / Chronic Care Management: CRR required this visit?  No   CCM required this visit?  No      Plan:     I have personally reviewed and noted the following in the patient's chart:   . Medical and social history . Use of alcohol, tobacco or illicit drugs  . Current medications and supplements . Functional ability and status . Nutritional status . Physical activity . Advanced directives . List of other physicians . Hospitalizations, surgeries, and ER visits in previous 12 months . Vitals . Screenings to include cognitive, depression, and falls . Referrals and appointments  In addition, I have reviewed and discussed with patient certain preventive protocols, quality metrics, and best practice recommendations. A written personalized care plan for preventive services as well as general preventive health recommendations were provided to patient.     Kellie Simmering, LPN   12/23/6158   Nurse Notes:

## 2020-10-01 ENCOUNTER — Other Ambulatory Visit: Payer: Self-pay | Admitting: Family Medicine

## 2020-10-01 DIAGNOSIS — M8949 Other hypertrophic osteoarthropathy, multiple sites: Secondary | ICD-10-CM

## 2020-10-01 DIAGNOSIS — M159 Polyosteoarthritis, unspecified: Secondary | ICD-10-CM

## 2020-10-01 DIAGNOSIS — M069 Rheumatoid arthritis, unspecified: Secondary | ICD-10-CM

## 2020-11-04 ENCOUNTER — Other Ambulatory Visit: Payer: Self-pay

## 2020-11-04 ENCOUNTER — Other Ambulatory Visit: Payer: Self-pay | Admitting: Family Medicine

## 2020-11-04 ENCOUNTER — Ambulatory Visit (INDEPENDENT_AMBULATORY_CARE_PROVIDER_SITE_OTHER): Payer: Medicare Other | Admitting: Family Medicine

## 2020-11-04 ENCOUNTER — Encounter: Payer: Self-pay | Admitting: Family Medicine

## 2020-11-04 VITALS — BP 130/70 | HR 58 | Temp 97.5°F | Ht 67.0 in | Wt 160.8 lb

## 2020-11-04 DIAGNOSIS — E7800 Pure hypercholesterolemia, unspecified: Secondary | ICD-10-CM

## 2020-11-04 DIAGNOSIS — M159 Polyosteoarthritis, unspecified: Secondary | ICD-10-CM

## 2020-11-04 DIAGNOSIS — R7303 Prediabetes: Secondary | ICD-10-CM

## 2020-11-04 DIAGNOSIS — M8949 Other hypertrophic osteoarthropathy, multiple sites: Secondary | ICD-10-CM

## 2020-11-04 DIAGNOSIS — Z0000 Encounter for general adult medical examination without abnormal findings: Secondary | ICD-10-CM

## 2020-11-04 DIAGNOSIS — D649 Anemia, unspecified: Secondary | ICD-10-CM

## 2020-11-04 DIAGNOSIS — M069 Rheumatoid arthritis, unspecified: Secondary | ICD-10-CM | POA: Diagnosis not present

## 2020-11-04 DIAGNOSIS — M06031 Rheumatoid arthritis without rheumatoid factor, right wrist: Secondary | ICD-10-CM

## 2020-11-04 LAB — POCT GLYCOSYLATED HEMOGLOBIN (HGB A1C): Hemoglobin A1C: 5.9 % — AB (ref 4.0–5.6)

## 2020-11-04 MED ORDER — MELOXICAM 7.5 MG PO TABS: 7.5000 mg | ORAL_TABLET | Freq: Every day | ORAL | 3 refills | Status: DC | PRN

## 2020-11-04 NOTE — Assessment & Plan Note (Signed)
Stable with intermittent joint pain flares  Plan: 1. Continue regular dosing of Tylenol Ext Str 500-1000mg  TID 2. May continue oral NSAID Meloxicam 7.5mg  PRN only refill 3. Re-consider topical NSAID diclofenac at future visit if needed 4. RICE therapy 5. Follow-up in future if flares, can consider X-ray - can refer to Ortho vs Neuro if need, future may need carpal tunnel inj

## 2020-11-04 NOTE — Progress Notes (Signed)
Subjective:    Patient ID: Keith Bates, male    DOB: 1941-04-08, 80 y.o.   MRN: [id number]  Keith Bates is a 81 y.o. male presenting on 11/04/2020 for Pre-diabetes and Hemorrhoids (Pt state last week he had a flare up with his hemorrhoids. He said it became inflamed due to constipation. )   HPI   Pre-Diabetes: Reports no concerns. Prior A1c 6.2, due today he is doing well CBGs:Not checking Meds:Never on meds Not on ACEi/ARB Lifestyle: - Diet (Improved diet, veggies, reduced fried foods, reduced sweets still, reduced ice cream) - Exercise (improveregular exerciseoutdoor mowing and active) Denies hypoglycemia  Osteoarthritis Multiple joints / history of RA vs OA RightWrist vs Possible Carpal Tunnel See prior notes for background information. - Today doing well. Still takingMeloxicam 7.5mg  PRN, not taking regularly - Also takes Tylenol, uses copper compression gloves - Pain can be increased repetitive activityand yardwork - Other joints with pain aching and stiffness at times, if more active, including knees  HYPERLIPIDEMIA: - Reports no concerns. Last lipid panel9/2021,mild elevated LDL but improved from last result. - Currently he stopped the Red Yeast Rice, tolerating well without side effects or myalgias - Decline statin  Additional updates  Constipation, fecal impaction He had 1 episode of impaction that gradually resolved. No further episodes.   Health Maintenance:  UTD PNA vaccine series  UTD COVID vaccine and booster.  Prostate Cancer - Followed by [facility] - Dr Manny,s/p robotic prostatectomy 06/2013 - No new concerns. Urology follows PSA - Upcoming MRI for prostate  Shingles vaccine was too high cost for him.   Depression screen [facility] 2/9 09/01/2020 05/05/2020 12/13/2019  Decreased Interest 0 0 0  Down, Depressed, Hopeless 0 0 0  PHQ - 2 Score 0 0 0  Altered sleeping - - -  Tired, decreased energy - - -  Change in  appetite - - -  Feeling bad or failure about yourself  - - -  Trouble concentrating - - -  Moving slowly or fidgety/restless - - -  Suicidal thoughts - - -  PHQ-9 Score - - -  Difficult doing work/chores - - -    Social History   Tobacco Use  . Smoking status: Former Smoker    Packs/day: 1.50    Years: 10.00    Pack years: 15.00    Types: Cigarettes    Quit date: 06/06/1972    Years since quitting: 48.4  . Smokeless tobacco: Former Network engineer  . Vaping Use: Former  Substance Use Topics  . Alcohol use: No  . Drug use: No    Review of Systems Per HPI unless specifically indicated above     Objective:    BP 130/70 (BP Location: Left Arm, Cuff Size: Normal)   Pulse (!) 58   Temp (!) 97.5 F (36.4 C) (Temporal)   Ht 5\' 7"  (1.702 m)   Wt 160 lb 12.8 oz (72.9 kg)   BMI 25.18 kg/m   Wt Readings from Last 3 Encounters:  11/04/20 160 lb 12.8 oz (72.9 kg)  09/01/20 155 lb (70.3 kg)  05/05/20 155 lb 9.6 oz (70.6 kg)    Physical Exam Vitals and nursing note reviewed.  Constitutional:      General: He is not in acute distress.    Appearance: He is well-developed. He is not diaphoretic.     Comments: Well-appearing, comfortable, cooperative  HENT:     Head: Normocephalic and atraumatic.  Eyes:     General:  Right eye: No discharge.        Left eye: No discharge.     Conjunctiva/sclera: Conjunctivae normal.  Neck:     Thyroid: No thyromegaly.  Cardiovascular:     Rate and Rhythm: Normal rate and regular rhythm.     Heart sounds: Normal heart sounds. No murmur heard.   Pulmonary:     Effort: Pulmonary effort is normal. No respiratory distress.     Breath sounds: Normal breath sounds. No wheezing or rales.  Musculoskeletal:        General: Normal range of motion.     Cervical back: Normal range of motion and neck supple.  Lymphadenopathy:     Cervical: No cervical adenopathy.  Skin:    General: Skin is warm and dry.     Findings: No erythema or  rash.  Neurological:     Mental Status: He is alert and oriented to person, place, and time.  Psychiatric:        Behavior: Behavior normal.     Comments: Well groomed, good eye contact, normal speech and thoughts       Recent Labs    04/27/20 0837 11/04/20 0818  HGBA1C 6.2* 5.9*     Results for orders placed or performed in visit on 11/04/20  POCT glycosylated hemoglobin (Hb A1C)  Result Value Ref Range   Hemoglobin A1C 5.9 (A) 4.0 - 5.6 %      Assessment & Plan:   Problem List Items Addressed This Visit    Rheumatoid arthritis involving right wrist (HCC)    Stable without flare Refilled NSAID Meloxicam, use PRN      Relevant Medications   meloxicam (MOBIC) 7.5 MG tablet   Primary osteoarthritis involving multiple joints    Stable with intermittent joint pain flares  Plan: 1. Continue regular dosing of Tylenol Ext Str 500-1000mg  TID 2. May continue oral NSAID Meloxicam 7.5mg  PRN only refill 3. Re-consider topical NSAID diclofenac at future visit if needed 4. RICE therapy 5. Follow-up in future if flares, can consider X-ray - can refer to Ortho vs Neuro if need, future may need carpal tunnel inj      Relevant Medications   meloxicam (MOBIC) 7.5 MG tablet   Pre-diabetes - Primary    A1c down to 5.9, improved w/ lifestyle   Plan:  1. Not on any therapy currently  2. Encourage improved lifestyle - low carb, low sugar diet, reduce portion size, continue improving regular exercise as discussed      Relevant Orders   POCT glycosylated hemoglobin (Hb A1C) (Completed)   Hyperlipidemia    Controlled cholesterol on lifestyle Last lipid panel 04/2020 The 10-year ASCVD risk score [name] DC Jr., et al., 2013) is: 17.4%   Plan: 1. Discussed ASCVD risk reduction strategies - decline statin at this time - Restart  OTC supplement Red Rice Yeast - he stopped temporarily was unsure it was for cholesterol 2. Continue ASA 81mg  for primary ASCVD risk reduction 3. Encourage  improved lifestyle - low carb/cholesterol, reduce portion size, continue improving regular exercise         Meds ordered this encounter  Medications  . meloxicam (MOBIC) 7.5 MG tablet    Sig: Take 1 tablet (7.5 mg total) by mouth daily as needed for pain. Up to 1-2 weeks as needed    Dispense:  30 tablet    Refill:  3    Follow up plan: Return in about 6 months (around 05/07/2021) for 6 month fasting lab only  then 1 week later Annual Physical.  Future labs ordered for 05/17/21 labs CMET CBC LIPID A1c - we will skip PSA as [facility] following prostate / PSA and MRI for prostate.  Nobie Putnam, DO [facility] 11/04/2020, 8:16 AM

## 2020-11-04 NOTE — Assessment & Plan Note (Signed)
Stable without flare Refilled NSAID Meloxicam, use PRN

## 2020-11-04 NOTE — Assessment & Plan Note (Signed)
A1c down to 5.9, improved w/ lifestyle   Plan:  1. Not on any therapy currently  2. Encourage improved lifestyle - low carb, low sugar diet, reduce portion size, continue improving regular exercise as discussed

## 2020-11-04 NOTE — Assessment & Plan Note (Signed)
Controlled cholesterol on lifestyle Last lipid panel 04/2020 The 10-year ASCVD risk score [name] DC Jr., et al., 2013) is: 17.4%   Plan: 1. Discussed ASCVD risk reduction strategies - decline statin at this time - Restart  OTC supplement Red Rice Yeast - he stopped temporarily was unsure it was for cholesterol 2. Continue ASA 81mg  for primary ASCVD risk reduction 3. Encourage improved lifestyle - low carb/cholesterol, reduce portion size, continue improving regular exercise

## 2020-11-04 NOTE — Patient Instructions (Signed)
Thank you for coming to the office today.  Keep track of BP if you are able to check it at home.  Recent Labs    04/27/20 0837 11/04/20 0818  HGBA1C 6.2* 5.9*    Keep up the great work on blood sugar!  Restart the Red Yeast Rice Supplement for cholesterol.  DUE for FASTING BLOOD WORK (no food or drink after midnight before the lab appointment, only water or coffee without cream/sugar on the morning of)  SCHEDULE "Lab Only" visit in the morning at the clinic for lab draw in 6 MONTHS   - Make sure Lab Only appointment is at about 1 week before your next appointment, so that results will be available  For Lab Results, once available within 2-3 days of blood draw, you can can log in to MyChart online to view your results and a brief explanation. Also, we can discuss results at next follow-up visit.    Please schedule a Follow-up Appointment to: No follow-ups on file.  If you have any other questions or concerns, please feel free to call the office or send a message through [place]. You may also schedule an earlier appointment if necessary.  Additionally, you may be receiving a survey about your experience at our office within a few days to 1 week by e-mail or mail. We value your feedback.  Nobie Putnam, DO [place]

## 2020-11-09 ENCOUNTER — Other Ambulatory Visit (HOSPITAL_COMMUNITY): Payer: Self-pay | Admitting: Urology

## 2020-11-09 ENCOUNTER — Other Ambulatory Visit (HOSPITAL_COMMUNITY): Payer: Self-pay | Admitting: Anesthesiology

## 2020-11-09 DIAGNOSIS — C61 Malignant neoplasm of prostate: Secondary | ICD-10-CM

## 2020-11-09 DIAGNOSIS — C7951 Secondary malignant neoplasm of bone: Secondary | ICD-10-CM

## 2020-11-19 ENCOUNTER — Encounter (HOSPITAL_COMMUNITY)
Admission: RE | Admit: 2020-11-19 | Discharge: 2020-11-19 | Disposition: A | Discharge status: Home / Self Care | Payer: Medicare Other | Source: Ambulatory Visit | Attending: Urology | Admitting: Urology

## 2020-11-19 ENCOUNTER — Other Ambulatory Visit: Payer: Self-pay

## 2020-11-19 DIAGNOSIS — C61 Malignant neoplasm of prostate: Secondary | ICD-10-CM | POA: Diagnosis not present

## 2020-11-19 DIAGNOSIS — C7952 Secondary malignant neoplasm of bone marrow: Secondary | ICD-10-CM | POA: Insufficient documentation

## 2020-11-19 DIAGNOSIS — C7951 Secondary malignant neoplasm of bone: Secondary | ICD-10-CM | POA: Insufficient documentation

## 2020-11-19 MED ORDER — TECHNETIUM TC 99M MEDRONATE IV KIT
20.1000 | PACK | Freq: Once | INTRAVENOUS | Status: AC
  Administered 2020-11-19: 20.1 via INTRAVENOUS

## 2021-04-03 ENCOUNTER — Other Ambulatory Visit: Payer: Self-pay | Admitting: Family Medicine

## 2021-04-03 DIAGNOSIS — J3089 Other allergic rhinitis: Secondary | ICD-10-CM

## 2021-04-03 NOTE — Telephone Encounter (Signed)
Requested Prescriptions  Pending Prescriptions Disp Refills  . fluticasone (FLONASE) 50 MCG/ACT nasal spray [Pharmacy Med Name: FLUTICASONE PROP 50 MCG SPRAY] 48 mL 2    Sig: PLACE 2 SPRAYS INTO BOTH NOSTRILS DAILY. USE FOR 4-6 WEEKS THEN STOP AND USE SEASONALLY OR AS NEEDED     Ear, Nose, and Throat: Nasal Preparations - Corticosteroids Passed - 04/03/2021  9:46 AM      Passed - Valid encounter within last 12 months    Recent Outpatient Visits          5 months ago Pre-diabetes   [facility] Olin Hauser, DO   11 months ago Annual physical exam   [place], Devonne Doughty, DO   1 year ago Gross hematuria   [place], Devonne Doughty, DO   1 year ago Annual physical exam   [facility] Olin Hauser, DO   2 years ago [facility] Parks Ranger, Devonne Doughty, DO      Future Appointments            In 1 month [place], Devonne Doughty, [facility], [place]   In 5 months  [facility], [place]

## 2021-05-14 ENCOUNTER — Other Ambulatory Visit: Payer: Self-pay

## 2021-05-14 DIAGNOSIS — M150 Primary generalized (osteo)arthritis: Secondary | ICD-10-CM

## 2021-05-14 DIAGNOSIS — E7800 Pure hypercholesterolemia, unspecified: Secondary | ICD-10-CM

## 2021-05-14 DIAGNOSIS — M069 Rheumatoid arthritis, unspecified: Secondary | ICD-10-CM

## 2021-05-14 DIAGNOSIS — M159 Polyosteoarthritis, unspecified: Secondary | ICD-10-CM

## 2021-05-14 DIAGNOSIS — D649 Anemia, unspecified: Secondary | ICD-10-CM

## 2021-05-14 DIAGNOSIS — R7303 Prediabetes: Secondary | ICD-10-CM

## 2021-05-14 DIAGNOSIS — Z0000 Encounter for general adult medical examination without abnormal findings: Secondary | ICD-10-CM

## 2021-05-17 ENCOUNTER — Other Ambulatory Visit: Payer: Medicare Other

## 2021-05-18 LAB — BASIC METABOLIC PANEL
BUN: 21 (ref 4–21)
CO2: 31 — AB (ref 13–22)
Chloride: 109 — AB (ref 99–108)
Creatinine: 0.5 — AB (ref 0.6–1.3)
Glucose: 113
Potassium: 4.6 (ref 3.4–5.3)
Sodium: 146 (ref 137–147)

## 2021-05-18 LAB — HEPATIC FUNCTION PANEL
ALT: 19 (ref 10–40)
AST: 27 (ref 14–40)
Alkaline Phosphatase: 72 (ref 25–125)
Bilirubin, Total: 0.8

## 2021-05-18 LAB — COMPREHENSIVE METABOLIC PANEL
Albumin: 4.1 (ref 3.5–5.0)
Calcium: 9.7 (ref 8.7–10.7)
GFR calc Af Amer: 119.5

## 2021-05-18 LAB — PSA: PSA: <0.015

## 2021-05-24 ENCOUNTER — Encounter: Payer: Medicare Other | Admitting: Family Medicine

## 2021-06-17 ENCOUNTER — Ambulatory Visit: Payer: Self-pay

## 2021-06-17 NOTE — Telephone Encounter (Signed)
Pt. Reports he started feeling weak 2 days ago. "I feel like a truck hit me." No fever. Has bilateral leg pain. No availability this week in the practice. Will go to UC today.

## 2021-06-17 NOTE — Telephone Encounter (Signed)
Reason for Disposition . [1] MODERATE weakness (i.e., interferes with work, school, normal activities) AND [2] cause unknown  (Exceptions: weakness with acute minor illness, or weakness from poor fluid intake)  Protocols used: Weakness (Generalized) and Fatigue-A-AH

## 2021-06-17 NOTE — Telephone Encounter (Signed)
Answer Assessment - Initial Assessment Questions 1. DESCRIPTION: "Describe how you are feeling."     Weak 2. SEVERITY: "How bad is it?"  "Can you stand and walk?"   - MILD - Feels weak or tired, but does not interfere with work, school or normal activities   - [place] to stand and walk; weakness interferes with work, school, or normal activities   - SEVERE - Unable to stand or walk     Moderate 3. ONSET:  "When did the weakness begin?"     2 days ago 4. CAUSE: "What do you think is causing the weakness?"     Unsure 5. MEDICINES: "Have you recently started a new medicine or had a change in the amount of a medicine?"     No 6. OTHER SYMPTOMS: "Do you have any other symptoms?" (e.g., chest pain, fever, cough, SOB, vomiting, diarrhea, bleeding, other areas of pain)     Legs hurt 7. PREGNANCY: "Is there any chance you are pregnant?" "When was your last menstrual period?"     N/a  Protocols used: Weakness (Generalized) and Fatigue-A-AH

## 2021-06-22 ENCOUNTER — Encounter: Payer: Self-pay | Admitting: Family Medicine

## 2021-06-22 ENCOUNTER — Ambulatory Visit (INDEPENDENT_AMBULATORY_CARE_PROVIDER_SITE_OTHER): Payer: Medicare Other | Admitting: Family Medicine

## 2021-06-22 ENCOUNTER — Other Ambulatory Visit: Payer: Self-pay

## 2021-06-22 VITALS — BP 139/86 | HR 71 | Ht 67.0 in | Wt 154.6 lb

## 2021-06-22 DIAGNOSIS — Z23 Encounter for immunization: Secondary | ICD-10-CM

## 2021-06-22 DIAGNOSIS — M06031 Rheumatoid arthritis without rheumatoid factor, right wrist: Secondary | ICD-10-CM

## 2021-06-22 DIAGNOSIS — M25571 Pain in right ankle and joints of right foot: Secondary | ICD-10-CM

## 2021-06-22 DIAGNOSIS — M159 Polyosteoarthritis, unspecified: Secondary | ICD-10-CM | POA: Diagnosis not present

## 2021-06-22 DIAGNOSIS — S93491A Sprain of other ligament of right ankle, initial encounter: Secondary | ICD-10-CM

## 2021-06-22 MED ORDER — MELOXICAM 7.5 MG PO TABS: 7.5000 mg | ORAL_TABLET | Freq: Every day | ORAL | 3 refills | Status: DC | PRN

## 2021-06-22 NOTE — Progress Notes (Signed)
Subjective:    Patient ID: Keith Bates, male    DOB: 1941-03-29, 80 y.o.   MRN: [id number]  [name] is a 80 y.o. male presenting on 06/22/2021 for Foot Pain   HPI  Right Ankle Sprain Reports 3 weeks ago accidental issue walking down some steps missed some and then inversion ankle sprain R ankle rolled. He did not have significant swelling or bruising, he has had some limping and abnormal gait since this time. Gradual improvement over 3 weeks but not resolving. He asks about could it be fractured or other injury. He had Influenza 1 week ago and said that caused joint aches and pains worse. - Taking Tylenol PRN some relief. 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, L arm some pain, no longer on meloxicam needs new order  Health Maintenance: Due for Flu Shot, will receive today    Depression screen [facility] 2/9 09/01/2020 05/05/2020 12/13/2019  Decreased Interest 0 0 0  Down, Depressed, Hopeless 0 0 0  PHQ - 2 Score 0 0 0  Altered sleeping - - -  Tired, decreased energy - - -  Change in appetite - - -  Feeling bad or failure about yourself  - - -  Trouble concentrating - - -  Moving slowly or fidgety/restless - - -  Suicidal thoughts - - -  PHQ-9 Score - - -  Difficult doing work/chores - - -    Social History   Tobacco Use   Smoking status: Former    Packs/day: 1.50    Years: 10.00    Pack years: 15.00    Types: Cigarettes    Quit date: 06/06/1972    Years since quitting: 49.0   Smokeless tobacco: Former  Scientific laboratory technician Use: Former  Substance Use Topics   Alcohol use: No   Drug use: No    Review of Systems Per HPI unless specifically indicated above     Objective:    BP 139/86   Pulse 71   Ht 5\' 7"  (1.702 m)   Wt 154 lb 9.6 oz (70.1 kg)   SpO2 99%   BMI 24.21 kg/m   Wt Readings from Last 3 Encounters:  06/22/21 154 lb 9.6 oz (70.1 kg)  11/04/20 160 lb 12.8 oz (72.9 kg)   09/01/20 155 lb (70.3 kg)    Physical Exam Vitals and nursing note reviewed.  Constitutional:      General: He is not in acute distress.    Appearance: Normal appearance. He is well-developed. He is not diaphoretic.     Comments: Well-appearing, comfortable, cooperative  HENT:     Head: Normocephalic and atraumatic.  Eyes:     General:        Right eye: No discharge.        Left eye: No discharge.     Conjunctiva/sclera: Conjunctivae normal.  Cardiovascular:     Rate and Rhythm: Normal rate.  Pulmonary:     Effort: Pulmonary effort is normal.  Musculoskeletal:     Comments: Right Ankle Localized pain without swelling or erythema to inner ankle, no bony tenderness, localized over ATF ligaments  Skin:    General: Skin is warm and dry.     Findings: No erythema or rash.  Neurological:     Mental Status: He is alert and oriented to person, place, and time.  Psychiatric:        Mood and Affect: Mood  normal.        Behavior: Behavior normal.        Thought Content: Thought content normal.     Comments: Well groomed, good eye contact, normal speech and thoughts     Results for orders placed or performed in visit on 11/04/20  POCT glycosylated hemoglobin (Hb A1C)  Result Value Ref Range   Hemoglobin A1C 5.9 (A) 4.0 - 5.6 %      Assessment & Plan:   Problem List Items Addressed This Visit     Rheumatoid arthritis involving right wrist (HCC)   Relevant Medications   meloxicam (MOBIC) 7.5 MG tablet   Primary osteoarthritis involving multiple joints   Relevant Medications   meloxicam (MOBIC) 7.5 MG tablet   Other Visit Diagnoses     Acute right ankle pain    -  Primary   Sprain of anterior talofibular ligament of right ankle, initial encounter       Needs flu shot       Relevant Orders   Flu Vaccine QUAD High Dose(Fluad) (Completed)       Consistent with mild-mod ankle sprain, likely grade 1, suspect inversion sprain given history and localized pain to lateral ATF  ligament. Improving edema, no ecchymosis, tolerating wt bearing ambulation - has some limping or antalgic gait but improving.  Plan: 1. Reassurance, will continue to heal over next few weeks 2. Supportive measures with elevation, ice packs for edema, relative rest, get Ankle ASO brace for support 3 weeks 3. Tylenol up to 1000mg  q 6-8 hr PRN (max 3 doses in 24 hours) - START anti inflammatory topical - OTC Voltaren (generic Diclofenac) topical 2-4 times a day as needed for pain swelling of affected joint for 1-2 weeks or longer. - Renew Meloxicam PRN 4. Return criteria given  Defer X-ray today reconsider if unresolved or refer Podiatry vs Ortho   Meds ordered this encounter  Medications   meloxicam (MOBIC) 7.5 MG tablet    Sig: Take 1 tablet (7.5 mg total) by mouth daily as needed for pain. Up to 1-2 weeks as needed    Dispense:  30 tablet    Refill:  3      Follow up plan: Return in about 3 weeks (around 07/13/2021), or if symptoms worsen or fail to improve, for 3 week follow-up Ankle sprain.   Nobie Putnam, DO [facility] 06/22/2021, 10:01 AM

## 2021-06-22 NOTE — Patient Instructions (Addendum)
Thank you for coming to the office today.  Ankle Sprain It seems like you have a mild to moderate ankle sprain, this should gradually heal on its own, but it does take time to get back to 100% normal. The biggest concern is to avoid any re-injury as your ankle is now weaker while it is healing. Please try to avoid any activities that cause pain. It is important to modify your activities to allow this to heal. You may benefit from an ASO Ankle Brace (over the counter - can get velcro or lace up) to limit movement in your ankle   Ice the area for 20 minutes at least 3-4 times a day Wrap the ankle with a compression wrap to prevent swelling Elevate the leg above the level of the heart as much as possible  Start Meloxicam 7.5mg  daily as needed for pain and inflammation.  START anti inflammatory topical - OTC Voltaren (generic Diclofenac) topical 2-4 times a day as needed for pain swelling of affected joint for 1-2 weeks or longer.  If not improved by 2-3 more weeks, we can do X-ray or referral.  Please schedule a Follow-up Appointment to: Return in about 3 weeks (around 07/13/2021), or if symptoms worsen or fail to improve, for 3 week follow-up Ankle sprain.  If you have any other questions or concerns, please feel free to call the office or send a message through [place]. You may also schedule an earlier appointment if necessary.  Additionally, you may be receiving a survey about your experience at our office within a few days to 1 week by e-mail or mail. We value your feedback.  Nobie Putnam, DO [place]

## 2021-06-25 ENCOUNTER — Other Ambulatory Visit: Payer: Self-pay | Admitting: Family Medicine

## 2021-06-25 ENCOUNTER — Encounter: Payer: Self-pay | Admitting: Family Medicine

## 2021-07-05 ENCOUNTER — Other Ambulatory Visit: Payer: Medicare Other

## 2021-07-06 ENCOUNTER — Other Ambulatory Visit: Payer: Self-pay

## 2021-07-06 ENCOUNTER — Ambulatory Visit (INDEPENDENT_AMBULATORY_CARE_PROVIDER_SITE_OTHER): Payer: Medicare Other | Admitting: Family Medicine

## 2021-07-06 ENCOUNTER — Encounter: Payer: Self-pay | Admitting: Family Medicine

## 2021-07-06 ENCOUNTER — Other Ambulatory Visit: Payer: Medicare Other

## 2021-07-06 VITALS — Wt 154.0 lb

## 2021-07-06 DIAGNOSIS — N3001 Acute cystitis with hematuria: Secondary | ICD-10-CM | POA: Diagnosis not present

## 2021-07-06 LAB — POCT URINALYSIS DIPSTICK
Bilirubin, UA: NEGATIVE
Glucose, UA: NEGATIVE
Ketones, UA: NEGATIVE
Protein, UA: POSITIVE — AB
Spec Grav, UA: >=1.03 — AB (ref 1.010–1.025)
Urobilinogen, UA: 0.2 E.U./dL
pH, UA: 5 (ref 5.0–8.0)

## 2021-07-06 MED ORDER — CIPROFLOXACIN HCL 500 MG PO TABS: 500.0000 mg | ORAL_TABLET | Freq: Two times a day (BID) | ORAL | 0 refills | Status: AC

## 2021-07-06 NOTE — Patient Instructions (Signed)
° °  Please schedule a Follow-up Appointment to: No follow-ups on file. ° °If you have any other questions or concerns, please feel free to call the office or send a message through MyChart. You may also schedule an earlier appointment if necessary. ° °Additionally, you may be receiving a survey about your experience at our office within a few days to 1 week by e-mail or mail. We value your feedback. ° °Ryland Tungate, DO °[facility] °

## 2021-07-06 NOTE — Progress Notes (Signed)
Virtual Visit via Telephone The purpose of this virtual visit is to provide medical care while limiting exposure to the novel coronavirus (COVID19) for both patient and office staff.  Consent was obtained for phone visit:  Yes.   Answered questions that patient had about telehealth interaction:  Yes.   I discussed the limitations, risks, security and privacy concerns of performing an evaluation and management service by telephone. I also discussed with the patient that there may be a patient responsible charge related to this service. The patient expressed understanding and agreed to proceed.  Patient Location: Home Provider Location: [facility] (Office)  Participants in virtual visit: - Patient: Keith Bates - CMA: Orinda Kenner, [place] - Provider: Dr Parks Ranger  ---------------------------------------------------------------------- Chief Complaint  Patient presents with   Urinary Tract Infection    S: Reviewed CMA documentation. I have called patient and gathered additional HPI as follows:  UTI Reports that symptoms started 2-3 months ago, persistent problem has not reported it until recently. Has had darker urine with dysuria and urinary frequency. He has had UTI before resolved with Cipro. He has PCN allergy. No blood in urine. In past he has had gross hematuria back in 2021.  Denies any fevers, chills, sweats, body ache, flank pain, nausea vomiting , abdominal pain, diarrhea  Past Medical History:  Diagnosis Date   Arthritis    DDD (degenerative disc disease)    Enlarged prostate    Environmental allergies    GERD (gastroesophageal reflux disease)    Hx of ulcer disease    Loss of weight    Nocturia    Prostate cancer (HCC)    Unspecified hemorrhoids without mention of complication    Social History   Tobacco Use   Smoking status: Former    Packs/day: 1.50    Years: 10.00    Pack years: 15.00    Types: Cigarettes    Quit date: 06/06/1972     Years since quitting: 49.1   Smokeless tobacco: Former  Scientific laboratory technician Use: Former  Substance Use Topics   Alcohol use: No   Drug use: No    Current Outpatient Medications:    ciprofloxacin (CIPRO) 500 MG tablet, Take 1 tablet (500 mg total) by mouth 2 (two) times daily for 7 days., Disp: 14 tablet, Rfl: 0   fluticasone (FLONASE) 50 MCG/ACT nasal spray, PLACE 2 SPRAYS INTO BOTH NOSTRILS DAILY. USE FOR 4-6 WEEKS THEN STOP AND USE SEASONALLY OR AS NEEDED, Disp: 48 mL, Rfl: 2   hydrocortisone (ANUSOL-HC) 25 MG suppository, Place 1 suppository (25 mg total) rectally 2 (two) times daily as needed for hemorrhoids or anal itching. For 7 days, Disp: 24 suppository, Rfl: 2   meloxicam (MOBIC) 7.5 MG tablet, Take 1 tablet (7.5 mg total) by mouth daily as needed for pain. Up to 1-2 weeks as needed, Disp: 30 tablet, Rfl: 3   Multiple Vitamin (MULTIVITAMIN) tablet, Take 1 tablet by mouth daily., Disp: , Rfl:    acetaminophen (TYLENOL) 500 MG tablet, Take 1 tablet (500 mg total) by mouth every 6 (six) hours as needed. (Patient not taking: Reported on 11/04/2020), Disp: 30 tablet, Rfl: 0   Red Yeast Rice Extract (RED YEAST RICE PO), Take by mouth. (Patient not taking: Reported on 11/04/2020), Disp: , Rfl:   Depression screen [facility] 2/9 09/01/2020 05/05/2020 12/13/2019  Decreased Interest 0 0 0  Down, Depressed, Hopeless 0 0 0  PHQ - 2 Score 0 0 0  Altered sleeping - - -  Tired, decreased energy - - -  Change in appetite - - -  Feeling bad or failure about yourself  - - -  Trouble concentrating - - -  Moving slowly or fidgety/restless - - -  Suicidal thoughts - - -  PHQ-9 Score - - -  Difficult doing work/chores - - -    No flowsheet data found.  -------------------------------------------------------------------------- O: No physical exam performed due to remote telephone encounter.  Lab results reviewed.  Recent Results (from the past 2160 hour(s))  Basic metabolic panel     Status: Abnormal    Collection Time: 05/18/21 12:00 AM  Result Value Ref Range   Glucose 113    BUN 21 4 - 21   CO2 31 (A) 13 - 22   Creatinine 0.5 (A) 0.6 - 1.3   Potassium 4.6 3.4 - 5.3   Sodium 146 137 - 147   Chloride 109 (A) 99 - 108  Comprehensive metabolic panel     Status: None   Collection Time: 05/18/21 12:00 AM  Result Value Ref Range   GFR calc Af Amer 119.5    Calcium 9.7 8.7 - 10.7   Albumin 4.1 3.5 - 5.0  Hepatic function panel     Status: None   Collection Time: 05/18/21 12:00 AM  Result Value Ref Range   Alkaline Phosphatase 72 25 - 125   ALT 19 10 - 40   AST 27 14 - 40   Bilirubin, Total 0.8   PSA     Status: None   Collection Time: 05/18/21 12:00 AM  Result Value Ref Range   PSA <0.015   POCT Urinalysis Dipstick     Status: Abnormal   Collection Time: 07/06/21 10:39 AM  Result Value Ref Range   Color, UA Yellow    Clarity, UA Cloudy    Glucose, UA Negative Negative   Bilirubin, UA Negative    Ketones, UA Negative    Spec Grav, UA >=1.030 (A) 1.010 - 1.025   Blood, UA Moderate ++    pH, UA 5.0 5.0 - 8.0   Protein, UA Positive (A) Negative   Urobilinogen, UA 0.2 0.2 or 1.0 E.U./dL   Nitrite, UA     Leukocytes, UA Moderate (2+) (A) Negative   Appearance     Odor      -------------------------------------------------------------------------- A&P:  Problem List Items Addressed This Visit   None Visit Diagnoses     Acute cystitis with hematuria    -  Primary   Relevant Medications   ciprofloxacin (CIPRO) 500 MG tablet   Other Relevant Orders   POCT Urinalysis Dipstick (Completed)   Urine Culture      Clinically consistent with UTI and confirmed on UA. No recent UTIs or abx courses.  No concern for pyelo today (no systemic symptoms, neg fever, back pain, n/v).  Plan: UA dipstick consistent with UTI 2. Ordered Urine culture 3. Cipro 500mg  BID x 7 days (PCN allergy) has done well on Cipro before. 4. Improve PO hydration 5. RTC if no improvement 1-2 weeks,  red flags given to return sooner   Meds ordered this encounter  Medications   ciprofloxacin (CIPRO) 500 MG tablet    Sig: Take 1 tablet (500 mg total) by mouth 2 (two) times daily for 7 days.    Dispense:  14 tablet    Refill:  0    Follow-up: - Return in 1 week as needed  Patient verbalizes understanding with the above medical recommendations including the  limitation of remote medical advice.  Specific follow-up and call-back criteria were given for patient to follow-up or seek medical care more urgently if needed.   - Time spent in direct consultation with patient on phone: 7 minutes   Nobie Putnam, [facility] 07/06/2021, 11:41 AM

## 2021-07-07 LAB — HEMOGLOBIN A1C
Hgb A1c MFr Bld: 6.4 % of total Hgb — ABNORMAL HIGH (ref <5.7)
Mean Plasma Glucose: 137 mg/dL
eAG (mmol/L): 7.6 mmol/L

## 2021-07-07 LAB — CBC WITH DIFFERENTIAL/PLATELET
Absolute Monocytes: 693 cells/uL (ref 200–950)
Basophils Absolute: 53 cells/uL (ref 0–200)
Basophils Relative: 0.8 %
Eosinophils Absolute: 139 cells/uL (ref 15–500)
Eosinophils Relative: 2.1 %
HCT: 42.5 % (ref 38.5–50.0)
Hemoglobin: 14.3 g/dL (ref 13.2–17.1)
Lymphs Abs: 2402 cells/uL (ref 850–3900)
MCH: 30.5 pg (ref 27.0–33.0)
MCHC: 33.6 g/dL (ref 32.0–36.0)
MCV: 90.6 fL (ref 80.0–100.0)
MPV: 10 fL (ref 7.5–12.5)
Monocytes Relative: 10.5 %
Neutro Abs: 3313 cells/uL (ref 1500–7800)
Neutrophils Relative %: 50.2 %
Platelets: 225 10*3/uL (ref 140–400)
RBC: 4.69 10*6/uL (ref 4.20–5.80)
RDW: 13.5 % (ref 11.0–15.0)
Total Lymphocyte: 36.4 %
WBC: 6.6 10*3/uL (ref 3.8–10.8)

## 2021-07-07 LAB — COMPLETE METABOLIC PANEL WITH GFR
AG Ratio: 1.6 (calc) (ref 1.0–2.5)
ALT: 15 U/L (ref 9–46)
AST: 17 U/L (ref 10–35)
Albumin: 4.1 g/dL (ref 3.6–5.1)
Alkaline phosphatase (APISO): 81 U/L (ref 35–144)
BUN/Creatinine Ratio: 20 (calc) (ref 6–22)
BUN: 14 mg/dL (ref 7–25)
CO2: 26 mmol/L (ref 20–32)
Calcium: 9.6 mg/dL (ref 8.6–10.3)
Chloride: 106 mmol/L (ref 98–110)
Creat: 0.69 mg/dL — ABNORMAL LOW (ref 0.70–1.28)
Globulin: 2.6 g/dL (calc) (ref 1.9–3.7)
Glucose, Bld: 96 mg/dL (ref 65–99)
Potassium: 3.9 mmol/L (ref 3.5–5.3)
Sodium: 142 mmol/L (ref 135–146)
Total Bilirubin: 0.5 mg/dL (ref 0.2–1.2)
Total Protein: 6.7 g/dL (ref 6.1–8.1)
eGFR: 94 mL/min/{1.73_m2} (ref >=60)

## 2021-07-07 LAB — LIPID PANEL
Cholesterol: 263 mg/dL — ABNORMAL HIGH (ref <200)
HDL: 69 mg/dL (ref >=40)
LDL Cholesterol (Calc): 176 mg/dL (calc) — ABNORMAL HIGH
Non-HDL Cholesterol (Calc): 194 mg/dL (calc) — ABNORMAL HIGH (ref <130)
Total CHOL/HDL Ratio: 3.8 (calc) (ref <5.0)
Triglycerides: 79 mg/dL (ref <150)

## 2021-07-08 LAB — URINE CULTURE
MICRO NUMBER:: 12691913
Result:: NO GROWTH
SPECIMEN QUALITY:: ADEQUATE

## 2021-07-12 ENCOUNTER — Ambulatory Visit (INDEPENDENT_AMBULATORY_CARE_PROVIDER_SITE_OTHER): Payer: Medicare Other | Admitting: Family Medicine

## 2021-07-12 ENCOUNTER — Other Ambulatory Visit: Payer: Self-pay

## 2021-07-12 ENCOUNTER — Other Ambulatory Visit: Payer: Self-pay | Admitting: Family Medicine

## 2021-07-12 ENCOUNTER — Encounter: Payer: Self-pay | Admitting: Family Medicine

## 2021-07-12 VITALS — BP 134/75 | HR 59 | Ht 67.0 in | Wt 155.2 lb

## 2021-07-12 DIAGNOSIS — M06031 Rheumatoid arthritis without rheumatoid factor, right wrist: Secondary | ICD-10-CM

## 2021-07-12 DIAGNOSIS — E7800 Pure hypercholesterolemia, unspecified: Secondary | ICD-10-CM

## 2021-07-12 DIAGNOSIS — Z0000 Encounter for general adult medical examination without abnormal findings: Secondary | ICD-10-CM | POA: Diagnosis not present

## 2021-07-12 DIAGNOSIS — R7303 Prediabetes: Secondary | ICD-10-CM | POA: Diagnosis not present

## 2021-07-12 DIAGNOSIS — M159 Polyosteoarthritis, unspecified: Secondary | ICD-10-CM | POA: Diagnosis not present

## 2021-07-12 NOTE — Patient Instructions (Addendum)
Thank you for coming to the office today.  Wait until January 2023 and medicare will pay for Shingrix shingles vaccine.  For hip and shoulder  START anti inflammatory topical - OTC Voltaren (generic Diclofenac) topical 2-4 times a day as needed for pain swelling of affected joint for 1-2 weeks or longer.  Restart Red Yeast Rice supplement for cholesterol, it is elevated  Recent Labs    11/04/20 0818 07/06/21 0753  HGBA1C 5.9* 6.4*    Elevated A1c up to 6.4, at risk of type 2 diabetes in future.  DUE for FASTING BLOOD WORK (no food or drink after midnight before the lab appointment, only water or coffee without cream/sugar on the morning of)  SCHEDULE "Lab Only" visit in the morning at the clinic for lab draw in 6 MONTHS    - Make sure Lab Only appointment is at about 1 week before your next appointment, so that results will be available  For Lab Results, once available within 2-3 days of blood draw, you can can log in to MyChart online to view your results and a brief explanation. Also, we can discuss results at next follow-up visit.  Iliotibial Band Syndrome Iliotibial band syndrome is a condition that often causes knee pain. It can also cause pain in the outside of the hip, thigh, and knee. The iliotibial band is a strip of tissue in each of the legs. This band runs from the outside of the hip and down the thigh to the outside of the knee. Repeatedly bending and straightening your knee can irritate your iliotibial band. What are the causes? This condition is caused by inflammation from rubbing (friction) of the iliotibial band as it moves over the thigh bone (femur) when you bend and straighten your knee again and again. What increases the risk? You are more likely to develop this condition if: You change elevation often while running on a treadmill. You run very long distances. You recently increased the length or intensity of your workouts. Intensity means how much effort you  put in. You run downhill often, or you just started running downhill. You ride a bike very far or often. You may also be at greater risk if: You start a new workout routine without first warming up your muscles. You have a job that requires you to bend, squat, or climb often. What are the signs or symptoms? Symptoms of this condition include: Pain along the outside of your knee that may be worse with activity, especially running or going up and down stairs. A feeling like a snap over your knee or hip. Swelling on the outside of your knee. Pain or a feeling of tightness in your hip. How is this diagnosed? This condition is diagnosed based on: Your symptoms. Your medical history. A physical exam. You may also see a health care provider who specializes in reducing pain and improving movement (physical therapist). A physical therapist may do an exam to check your balance, movement, and way of walking or running (gait) to see whether the way you move could add to your injury. You may also have tests to measure your strength, flexibility, and range of motion. How is this treated? This condition may be treated by: Taking NSAIDs, such as ibuprofen, to help relieve pain and swelling. Resting and limiting exercise. Returning to activities gradually. Doing exercises to improve movement and strength (physical therapy) as told by your health care provider. Having an injection of steroid medicine. This is medicine that helps relieve inflammation.  Having surgery. This may be done if your symptoms do not improve after other treatments. Follow these instructions at home: Managing pain, stiffness, and swelling If directed, put ice on the injured area. To do this: Put ice in a plastic bag. Place a towel between your skin and the bag. Leave the ice on for 20 minutes, 2-3 times a day. Remove the ice if your skin turns bright red. This is very important. If you cannot feel pain, heat, or cold, you have a  greater risk of damage to the area.  Activity Return to your normal activities as told by your health care provider. Ask your health care provider what activities are safe for you. Include low-impact activities, such as swimming, in your exercise routine. Check with your health care provider to make sure running is safe for you. Do exercises as told by your health care provider. General instructions Take over-the-counter and prescription medicines only as told by your health care provider. Make sure you wear shoes that fit well and have good cushioning and arch support. Keep all follow-up visits. This is important. How is this prevented? Warm up and stretch before being active. Cool down and stretch after being active. Give your body time to rest between periods of activity. Consider getting help from a coach or trainer to come up with a safe running plan and a plan to advance (progress) your training that fits your goals and ability. Change directions often while running around a track. Replace your shoes when the soles are worn out. Maintain physical fitness. This includes strength and flexibility. Contact a health care provider if: Your pain does not improve. Your pain gets worse even with treatment. Summary Iliotibial band syndrome is a condition that often causes knee pain. It can also cause pain in the outside of your hip, thigh, and knee. Treatment includes taking NSAIDs, resting, returning to activities gradually, and doing physical therapy exercises. Return to your normal activities as told by your health care provider. Ask your health care provider what activities are safe for you. This information is not intended to replace advice given to you by your health care provider. Make sure you discuss any questions you have with your health care provider. Document Revised: 11/25/2019 Document Reviewed: 11/25/2019 Elsevier Patient Education  [place] Band  Syndrome Rehab Ask your health care provider which exercises are safe for you. Do exercises exactly as told by your health care provider and adjust them as directed. It is normal to feel mild stretching, pulling, tightness, or discomfort as you do these exercises. Stop right away if you feel sudden pain or your pain gets significantly worse. Do not begin these exercises until told by your health care provider. Stretching and range-of-motion exercises These exercises warm up your muscles and joints and improve the movement and flexibility of your hip and pelvis. Quadriceps stretch, prone  Lie on your abdomen (prone position) on a firm surface, such as a bed or padded floor. Bend your left / right knee and reach back to hold your ankle or pant leg. If you cannot reach your ankle or pant leg, loop a belt around your foot and grab the belt instead. Gently pull your heel toward your buttocks. Your knee should not slide out to the side. You should feel a stretch in the front of your thigh and knee (quadriceps). Hold this position for __________ seconds. Repeat __________ times. Complete this exercise __________ times a day. Iliotibial band stretch An iliotibial  band is a strong band of muscle tissue that runs from the outer side of your hip to the outer side of your thigh and knee. Lie on your side with your left / right leg in the top position. Bend both of your knees and grab your left / right ankle. Stretch out your bottom arm to help you balance. Slowly bring your top knee back so your thigh goes behind your trunk. Slowly lower your top leg toward the floor until you feel a gentle stretch on the outside of your left / right hip and thigh. If you do not feel a stretch and your knee will not fall farther, place the heel of your other foot on top of your knee and pull your knee down toward the floor with your foot. Hold this position for __________ seconds. Repeat __________ times. Complete this  exercise __________ times a day. Strengthening exercises These exercises build strength and endurance in your hip and pelvis. Endurance is the ability to use your muscles for a long time, even after they get tired. Straight leg raises, side-lying This exercise strengthens the muscles that rotate the leg at the hip and move it away from your body (hip abductors). Lie on your side with your left / right leg in the top position. Lie so your head, shoulder, hip, and knee line up. You may bend your bottom knee to help you balance. Roll your hips slightly forward so your hips are stacked directly over each other and your left / right knee is facing forward. Tense the muscles in your outer thigh and lift your top leg 4-6 inches (10-15 cm). Hold this position for __________ seconds. Slowly lower your leg to return to the starting position. Let your muscles relax completely before doing another repetition. Repeat __________ times. Complete this exercise __________ times a day. Leg raises, prone This exercise strengthens the muscles that move the hips backward (hip extensors). Lie on your abdomen (prone position) on your bed or a firm surface. You can put a pillow under your hips if that is more comfortable for your lower back. Bend your left / right knee so your foot is straight up in the air. Squeeze your buttocks muscles and lift your left / right thigh off the bed. Do not let your back arch. Tense your thigh muscle as hard as you can without increasing any knee pain. Hold this position for __________ seconds. Slowly lower your leg to return to the starting position and allow it to relax completely. Repeat __________ times. Complete this exercise __________ times a day. Hip hike Stand sideways on a bottom step. Stand on your left / right leg with your other foot unsupported next to the step. You can hold on to a railing or wall for balance if needed. Keep your knees straight and your torso square.  Then lift your left / right hip up toward the ceiling. Slowly let your left / right hip lower toward the floor, past the starting position. Your foot should get closer to the floor. Do not lean or bend your knees. Repeat __________ times. Complete this exercise __________ times a day. This information is not intended to replace advice given to you by your health care provider. Make sure you discuss any questions you have with your health care provider. Document Revised: 10/02/2019 Document Reviewed: 10/02/2019 Elsevier Patient Education  [place].   Please schedule a Follow-up Appointment to: Return in about 6 months (around 01/10/2022) for 6 month fasting  lab only then 1 week later Follow-up PreDM HLD Arthritis.  If you have any other questions or concerns, please feel free to call the office or send a message through [place]. You may also schedule an earlier appointment if necessary.  Additionally, you may be receiving a survey about your experience at our office within a few days to 1 week by e-mail or mail. We value your feedback.  Nobie Putnam, DO [place]

## 2021-07-12 NOTE — Progress Notes (Signed)
Subjective:    Patient ID: Keith Bates, male    DOB: 12-Oct-1940, 80 y.o.   MRN: [id number]  Keith Bates is a 80 y.o. male presenting on 07/12/2021 for Annual Exam   HPI  Here for Annual Physical and Lab REview.  Pre-Diabetes: Reports concern with poor diet lately Now A1c up to 6.4, prior 5.8 to 6.2 CBGs: Not checking Meds: Never on meds Not on ACEi/ARB Lifestyle: - Diet (Improved diet, veggies, reduced fried foods, reduced sweets still, reduced ice cream) - Exercise (improve regular exercise outdoor mowing and active) Denies hypoglycemia    Osteoarthritis Multiple joints / history of RA vs OA Right Wrist vs Possible Carpal Tunnel See prior notes for background information. - Today doing well. Still taking Meloxicam 7.5mg  PRN, not taking regularly - Also takes Tylenol, uses copper compression gloves - Pain can be increased repetitive activity and yardwork - Other joints with pain aching and stiffness at times, if more active, including knees Admits R hip popping pain.   HYPERLIPIDEMIA: - Reports no concerns. Last lipid panel 06/2021 increased LDL >176 - Currently he stopped the Red Yeast Rice, tolerating well without side effects or myalgias - Decline statin  Additional update  UTI improving now on Cipro has 2 days left.  Chronic Bilateral Shoulder Pain - worse with overuse, see above history OA/DJD / RA    Health Maintenance:    UTD PNA vaccine series  UTD COVID vaccine and booster. Due for bivalent booster in future.   Prostate Cancer - Followed by [facility] - Dr Tresa Moore, s/p robotic prostatectomy 06/2013 - No new concerns. Urology follows PSA    Shingles vaccine was too high cost for him.  Depression screen [facility] 2/9 09/01/2020 05/05/2020 12/13/2019  Decreased Interest 0 0 0  Down, Depressed, Hopeless 0 0 0  PHQ - 2 Score 0 0 0  Altered sleeping - - -  Tired, decreased energy - - -  Change in appetite - - -  Feeling bad or failure about  yourself  - - -  Trouble concentrating - - -  Moving slowly or fidgety/restless - - -  Suicidal thoughts - - -  PHQ-9 Score - - -  Difficult doing work/chores - - -    Past Medical History:  Diagnosis Date   Arthritis    DDD (degenerative disc disease)    Enlarged prostate    Environmental allergies    GERD (gastroesophageal reflux disease)    Hx of ulcer disease    Loss of weight    Nocturia    Prostate cancer ([place])    Unspecified hemorrhoids without mention of complication    Past Surgical History:  Procedure Laterality Date   COLONOSCOPY  03/13/2013   HEMORRHOID BANDING     LYMPHADENECTOMY Bilateral 06/21/2013   Procedure: LYMPHADENECTOMY  " BILATERAL PELVIC LYMPH NODE DISSECTION ;  Surgeon: Alexis Frock, MD;  Location: [facility];  Service: Urology;  Laterality: Bilateral;   ROBOT ASSISTED LAPAROSCOPIC RADICAL PROSTATECTOMY N/A 06/21/2013   Procedure: ROBOTIC ASSISTED LAPAROSCOPIC RADICAL PROSTATECTOMY;  Surgeon: Alexis Frock, MD;  Location: [facility];  Service: Urology;  Laterality: N/A;   Social History   Socioeconomic History   Marital status: Married    Spouse name: Not on file   Number of children: Not on file   Years of education: Not on file   Highest education level: Not on file  Occupational History   Occupation: retired  Tobacco Use   Smoking status: Former  Packs/day: 1.50    Years: 10.00    Pack years: 15.00    Types: Cigarettes    Quit date: 06/06/1972    Years since quitting: 49.1   Smokeless tobacco: Former  Scientific laboratory technician Use: Former  Substance and Sexual Activity   Alcohol use: No   Drug use: No   Sexual activity: Not Currently  Other Topics Concern   Not on file  Social History Narrative   Not on file   Social Determinants of Health   Financial Resource Strain: Low Risk    Difficulty of Paying Living Expenses: Not hard at all  Food Insecurity: No Food Insecurity   Worried About Charity fundraiser in the Last Year: Never true    Arboriculturist in the Last Year: Never true  Transportation Needs: No Transportation Needs   Lack of Transportation (Medical): No   Lack of Transportation (Non-Medical): No  Physical Activity: Insufficiently Active   Days of Exercise per Week: 7 days   Minutes of Exercise per Session: 20 min  Stress: No Stress Concern Present   Feeling of Stress : Not at all  Social Connections: Not on file  Intimate Partner Violence: Not on file   Family History  Problem Relation Age of Onset   Prostate cancer Father        with mets to nodes   Cancer Brother        colon and prostate   Heart attack Mother    Current Outpatient Medications on File Prior to Visit  Medication Sig   ciprofloxacin (CIPRO) 500 MG tablet Take 1 tablet (500 mg total) by mouth 2 (two) times daily for 7 days.   fluticasone (FLONASE) 50 MCG/ACT nasal spray PLACE 2 SPRAYS INTO BOTH NOSTRILS DAILY. USE FOR 4-6 WEEKS THEN STOP AND USE SEASONALLY OR AS NEEDED   hydrocortisone (ANUSOL-HC) 25 MG suppository Place 1 suppository (25 mg total) rectally 2 (two) times daily as needed for hemorrhoids or anal itching. For 7 days   meloxicam (MOBIC) 7.5 MG tablet Take 1 tablet (7.5 mg total) by mouth daily as needed for pain. Up to 1-2 weeks as needed   Multiple Vitamin (MULTIVITAMIN) tablet Take 1 tablet by mouth daily.   acetaminophen (TYLENOL) 500 MG tablet Take 1 tablet (500 mg total) by mouth every 6 (six) hours as needed. (Patient not taking: Reported on 11/04/2020)   Red Yeast Rice Extract (RED YEAST RICE PO) Take by mouth. (Patient not taking: Reported on 11/04/2020)   No current facility-administered medications on file prior to visit.    Review of Systems  Constitutional:  Negative for activity change, appetite change, chills, diaphoresis, fatigue and fever.  HENT:  Negative for congestion and hearing loss.   Eyes:  Negative for visual disturbance.  Respiratory:  Negative for cough, chest tightness, shortness of breath and  wheezing.   Cardiovascular:  Negative for chest pain, palpitations and leg swelling.  Gastrointestinal:  Negative for abdominal pain, constipation, diarrhea, nausea and vomiting.  Genitourinary:  Negative for dysuria, frequency and hematuria.  Musculoskeletal:  Positive for arthralgias. Negative for neck pain.  Skin:  Negative for rash.  Neurological:  Negative for dizziness, weakness, light-headedness, numbness and headaches.  Hematological:  Negative for adenopathy.  Psychiatric/Behavioral:  Negative for behavioral problems, dysphoric mood and sleep disturbance.   Per HPI unless specifically indicated above      Objective:    BP 134/75   Pulse (!) 59   Ht 5'  7" (1.702 m)   Wt 155 lb 3.2 oz (70.4 kg)   SpO2 100%   BMI 24.31 kg/m   Wt Readings from Last 3 Encounters:  07/12/21 155 lb 3.2 oz (70.4 kg)  07/06/21 154 lb (69.9 kg)  06/22/21 154 lb 9.6 oz (70.1 kg)    Physical Exam Vitals and nursing note reviewed.  Constitutional:      General: He is not in acute distress.    Appearance: He is well-developed. He is not diaphoretic.     Comments: Well-appearing, comfortable, cooperative  HENT:     Head: Normocephalic and atraumatic.  Eyes:     General:        Right eye: No discharge.        Left eye: No discharge.     Conjunctiva/sclera: Conjunctivae normal.     Pupils: Pupils are equal, round, and reactive to light.  Neck:     Thyroid: No thyromegaly.     Vascular: No carotid bruit.  Cardiovascular:     Rate and Rhythm: Normal rate and regular rhythm.     Pulses: Normal pulses.     Heart sounds: Normal heart sounds. No murmur heard. Pulmonary:     Effort: Pulmonary effort is normal. No respiratory distress.     Breath sounds: Normal breath sounds. No wheezing or rales.  Abdominal:     General: Bowel sounds are normal. There is no distension.     Palpations: Abdomen is soft. There is no mass.     Tenderness: There is no abdominal tenderness.  Musculoskeletal:         General: No tenderness. Normal range of motion.     Cervical back: Normal range of motion and neck supple.     Right lower leg: No edema.     Left lower leg: No edema.     Comments: Upper / Lower Extremities: - Normal muscle tone, strength bilateral upper extremities 5/5, lower extremities 5/5  Right Hip evaluation No dislocation or acetabular symptoms. FABER FADIR intact, has localized symptoms lower down lateral leg IT Band Region.  Lymphadenopathy:     Cervical: No cervical adenopathy.  Skin:    General: Skin is warm and dry.     Findings: No erythema or rash.  Neurological:     Mental Status: He is alert and oriented to person, place, and time.     Comments: Distal sensation intact to light touch all extremities  Psychiatric:        Mood and Affect: Mood normal.        Behavior: Behavior normal.        Thought Content: Thought content normal.     Comments: Well groomed, good eye contact, normal speech and thoughts      Results for orders placed or performed in visit on 07/06/21  Urine Culture   Specimen: Urine  Result Value Ref Range   MICRO NUMBER: [id number]    SPECIMEN QUALITY: Adequate    Sample Source URINE, CLEAN CATCH    STATUS: FINAL    Result: No Growth   POCT Urinalysis Dipstick  Result Value Ref Range   Color, UA Yellow    Clarity, UA Cloudy    Glucose, UA Negative Negative   Bilirubin, UA Negative    Ketones, UA Negative    Spec Grav, UA >=1.030 (A) 1.010 - 1.025   Blood, UA Moderate ++    pH, UA 5.0 5.0 - 8.0   Protein, UA Positive (A) Negative   Urobilinogen,  UA 0.2 0.2 or 1.0 E.U./dL   Nitrite, UA     Leukocytes, UA Moderate (2+) (A) Negative   Appearance     Odor        Assessment & Plan:   Problem List Items Addressed This Visit     Rheumatoid arthritis involving right wrist (HCC)    Stable without flare Refilled NSAID Meloxicam, use PRN      Primary osteoarthritis involving multiple joints    Stable with intermittent joint pain  flares Admits shoulders and R Hip flare recently  Plan: 1. Continue regular dosing of Tylenol Ext Str 500-1000mg  TID 2. May continue oral NSAID Meloxicam 7.5mg  PRN only refill 3. Re-consider topical NSAID diclofenac at future visit if needed 4. RICE therapy 5. Follow-up in future if flares, can consider X-ray - can refer to Ortho      Pre-diabetes    Elevated A1c to 6.4 now Poor diet with inc sugar / carb Diet treatment with lifestyle overhaul      Hyperlipidemia    Elevated acholesterol on lifestyle Last lipid panel 06/2021 elevated LDL >170 The 10-year ASCVD risk score (Arnett DK, et al., 2019) is: 16.5%   Plan: 1. Discussed ASCVD risk reduction strategies - decline statin at this time - Restart  OTC supplement Red Rice Yeast 2. Continue ASA 81mg  for primary ASCVD risk reduction 3. Encourage improved lifestyle - low carb/cholesterol, reduce portion size, continue improving regular exercise      Other Visit Diagnoses     Annual physical exam    -  Primary       Updated Health Maintenance information Reviewed recent lab results with patient Encouraged improvement to lifestyle with diet and exercise Goal of weight loss   No orders of the defined types were placed in this encounter.     Follow up plan: Return in about 6 months (around 01/10/2022) for 6 month fasting lab only then 1 week later Follow-up PreDM HLD Arthritis.  Future Lipid + A1c in 6 months  Nobie Putnam, DO [facility] 07/12/2021, 2:53 PM

## 2021-07-12 NOTE — Assessment & Plan Note (Signed)
Stable with intermittent joint pain flares Admits shoulders and R Hip flare recently  Plan: 1. Continue regular dosing of Tylenol Ext Str 500-1000mg  TID 2. May continue oral NSAID Meloxicam 7.5mg  PRN only refill 3. Re-consider topical NSAID diclofenac at future visit if needed 4. RICE therapy 5. Follow-up in future if flares, can consider X-ray - can refer to Ortho

## 2021-07-12 NOTE — Assessment & Plan Note (Signed)
Elevated A1c to 6.4 now Poor diet with inc sugar / carb Diet treatment with lifestyle overhaul

## 2021-07-12 NOTE — Assessment & Plan Note (Signed)
Stable without flare Refilled NSAID Meloxicam, use PRN

## 2021-07-12 NOTE — Assessment & Plan Note (Addendum)
Elevated acholesterol on lifestyle Last lipid panel 06/2021 elevated LDL >170 The 10-year ASCVD risk score (Arnett DK, et al., 2019) is: 16.5%   Plan: 1. Discussed ASCVD risk reduction strategies - decline statin at this time - Restart  OTC supplement Red Rice Yeast 2. Continue ASA 81mg  for primary ASCVD risk reduction 3. Encourage improved lifestyle - low carb/cholesterol, reduce portion size, continue improving regular exercise

## 2021-09-07 ENCOUNTER — Ambulatory Visit: Payer: Medicare Other

## 2021-09-10 ENCOUNTER — Ambulatory Visit: Payer: Medicare Other

## 2021-10-15 ENCOUNTER — Ambulatory Visit (INDEPENDENT_AMBULATORY_CARE_PROVIDER_SITE_OTHER): Payer: Medicare Other

## 2021-10-15 ENCOUNTER — Other Ambulatory Visit: Payer: Self-pay

## 2021-10-15 VITALS — BP 124/62 | HR 66 | Temp 98.4°F | Ht 71.0 in | Wt 159.6 lb

## 2021-10-15 DIAGNOSIS — Z0000 Encounter for general adult medical examination without abnormal findings: Secondary | ICD-10-CM

## 2021-10-15 NOTE — Progress Notes (Signed)
Subjective:   Keith Bates is a 81 y.o. male who presents for Medicare Annual/Subsequent preventive examination.  Review of Systems           Objective:    There were no vitals filed for this visit. There is no height or weight on file to calculate BMI.  Advanced Directives 09/01/2020 08/27/2019 07/23/2013 06/21/2013 06/17/2013  Does Patient Have a Medical Advance Directive? No No Patient does not have advance directive;Patient would not like information Patient does not have advance directive Patient does not have advance directive  Pre-existing out of facility DNR order (yellow form or pink MOST form) - - - No -    Current Medications (verified) Outpatient Encounter Medications as of 10/15/2021  Medication Sig   acetaminophen (TYLENOL) 500 MG tablet Take 1 tablet (500 mg total) by mouth every 6 (six) hours as needed. (Patient not taking: Reported on 11/04/2020)   fluticasone (FLONASE) 50 MCG/ACT nasal spray PLACE 2 SPRAYS INTO BOTH NOSTRILS DAILY. USE FOR 4-6 WEEKS THEN STOP AND USE SEASONALLY OR AS NEEDED   hydrocortisone (ANUSOL-HC) 25 MG suppository Place 1 suppository (25 mg total) rectally 2 (two) times daily as needed for hemorrhoids or anal itching. For 7 days   meloxicam (MOBIC) 7.5 MG tablet Take 1 tablet (7.5 mg total) by mouth daily as needed for pain. Up to 1-2 weeks as needed   Multiple Vitamin (MULTIVITAMIN) tablet Take 1 tablet by mouth daily.   Red Yeast Rice Extract (RED YEAST RICE PO) Take by mouth. (Patient not taking: Reported on 11/04/2020)   No facility-administered encounter medications on file as of 10/15/2021.    Allergies (verified) Other, Plasticized base [plastibase], and Penicillins   History: Past Medical History:  Diagnosis Date   Arthritis    DDD (degenerative disc disease)    Enlarged prostate    Environmental allergies    GERD (gastroesophageal reflux disease)    Hx of ulcer disease    Loss of weight    Nocturia    Prostate cancer ([place])     Unspecified hemorrhoids without mention of complication    Past Surgical History:  Procedure Laterality Date   COLONOSCOPY  03/13/2013   HEMORRHOID BANDING     LYMPHADENECTOMY Bilateral 06/21/2013   Procedure: LYMPHADENECTOMY  " BILATERAL PELVIC LYMPH NODE DISSECTION ;  Surgeon: Alexis Frock, MD;  Location: [facility];  Service: Urology;  Laterality: Bilateral;   ROBOT ASSISTED LAPAROSCOPIC RADICAL PROSTATECTOMY N/A 06/21/2013   Procedure: ROBOTIC ASSISTED LAPAROSCOPIC RADICAL PROSTATECTOMY;  Surgeon: Alexis Frock, MD;  Location: [facility];  Service: Urology;  Laterality: N/A;   Family History  Problem Relation Age of Onset   Prostate cancer Father        with mets to nodes   Cancer Brother        colon and prostate   Heart attack Mother    Social History   Socioeconomic History   Marital status: Married    Spouse name: Not on file   Number of children: Not on file   Years of education: Not on file   Highest education level: Not on file  Occupational History   Occupation: retired  Tobacco Use   Smoking status: Former    Packs/day: 1.50    Years: 10.00    Pack years: 15.00    Types: Cigarettes    Quit date: 06/06/1972    Years since quitting: 49.3   Smokeless tobacco: Former  Scientific laboratory technician Use: Former  Substance and Sexual  Activity   Alcohol use: No   Drug use: No   Sexual activity: Not Currently  Other Topics Concern   Not on file  Social History Narrative   Not on file   Social Determinants of Health   Financial Resource Strain: Not on file  Food Insecurity: Not on file  Transportation Needs: Not on file  Physical Activity: Not on file  Stress: Not on file  Social Connections: Not on file    Tobacco Counseling Counseling given: Not Answered   Clinical Intake:  Pre-visit preparation completed: Yes        Nutritional Risks: None Diabetes: No  How often do you need to have someone help you when you read instructions, pamphlets, or other  written materials from your doctor or pharmacy?: 1 - Never  Diabetic?no  Interpreter Needed?: No  Information entered by :: Kirke Shaggy, LPN   Activities of Daily Living No flowsheet data found.  Patient Care Team: Olin Hauser, DO as PCP - General (Family Medicine) Christene Lye, MD (General Surgery)  Indicate any recent Medical Services you may have received from other than [facility] providers in the past year (date may be approximate).     Assessment:   This is a routine wellness examination for Keith Bates.  Hearing/Vision screen No results found.  Dietary issues and exercise activities discussed:     Goals Addressed   None   Depression Screen PHQ 2/9 Scores 09/01/2020 05/05/2020 12/13/2019 08/27/2019 04/30/2019 10/19/2018 04/20/2018  PHQ - 2 Score 0 0 0 0 1 0 0  PHQ- 9 Score - - - - 2 - -    Fall Risk Fall Risk  09/01/2020 05/05/2020 12/23/2019 12/13/2019 08/27/2019  Falls in the past year? 0 0 0 0 0  Number falls in past yr: - 0 0 0 0  Injury with Fall? - 0 0 0 0  Risk for fall due to : Medication side effect - - - -  Follow up Falls evaluation completed;Education provided;Falls prevention discussed Falls evaluation completed - Falls evaluation completed -    FALL RISK PREVENTION PERTAINING TO THE HOME:  Any stairs in or around the home? Yes  If so, are there any without handrails? No  Home free of loose throw rugs in walkways, pet beds, electrical cords, etc? Yes  Adequate lighting in your home to reduce risk of falls? Yes   ASSISTIVE DEVICES UTILIZED TO PREVENT FALLS:  Life alert? No  Use of a cane, walker or w/c? No  Grab bars in the bathroom? No  Shower chair or bench in shower? No  Elevated toilet seat or a handicapped toilet? No   TIMED UP AND GO:  Was the test performed? Yes .  Length of time to ambulate 10 feet: 4 sec.   Gait steady and fast without use of assistive device  Cognitive Function:     6CIT Screen 09/01/2020  What Year? 0  points  What month? 0 points  What time? 0 points  Count back from 20 2 points  Months in reverse 0 points  Repeat phrase 6 points  Total Score 8    Immunizations Immunization History  Administered Date(s) Administered   Fluad Quad(high Dose 65+) 05/05/2020, 06/22/2021   Influenza, High Dose Seasonal PF 04/20/2017, 03/28/2019   Influenza-Unspecified 03/30/2019   PFIZER(Purple Top)SARS-COV-2 Vaccination 08/23/2019, 09/13/2019, 07/14/2020    TDAP status: Up to date  Flu Vaccine status: Up to date  Pneumococcal vaccine status: Declined,  Education has been provided regarding  the importance of this vaccine but patient still declined. Advised may receive this vaccine at local pharmacy or Health Dept. Aware to provide a copy of the vaccination record if obtained from local pharmacy or Health Dept. Verbalized acceptance and understanding.   Covid-19 vaccine status: Completed vaccines  Qualifies for Shingles Vaccine? Yes   Zostavax completed No   Shingrix Completed?: No.    Education has been provided regarding the importance of this vaccine. Patient has been advised to call insurance company to determine out of pocket expense if they have not yet received this vaccine. Advised may also receive vaccine at local pharmacy or Health Dept. Verbalized acceptance and understanding.  Screening Tests Health Maintenance  Topic Date Due   Zoster Vaccines- Shingrix (1 of 2) Never done   Pneumonia Vaccine 39+ Years old (1 - PCV) Never done   COVID-19 Vaccine (4 - Booster for Pfizer series) 09/08/2020   TETANUS/TDAP  03/30/2023   INFLUENZA VACCINE  Completed   HPV VACCINES  Aged Out    Health Maintenance  Health Maintenance Due  Topic Date Due   Zoster Vaccines- Shingrix (1 of 2) Never done   Pneumonia Vaccine 68+ Years old (1 - PCV) Never done   COVID-19 Vaccine (4 - Booster for [employer] series) 09/08/2020    Colorectal cancer screening: Type of screening: Colonoscopy. Completed 03/13/13.  Repeat every AGED OUT years  Lung Cancer Screening: (Low Dose CT Chest recommended if Age 67-80 years, 30 pack-year currently smoking OR have quit w/in 15years.) does not qualify.    Additional Screening:  Hepatitis C Screening: does not qualify; Completed NO  Vision Screening: Recommended annual ophthalmology exams for early detection of glaucoma and other disorders of the eye. Is the patient up to date with their annual eye exam?  Yes  Who is the provider or what is the name of the office in which the patient attends annual eye exams? Dr.Woodard If pt is not established with a provider, would they like to be referred to a provider to establish care? No .   Dental Screening: Recommended annual dental exams for proper oral hygiene  Community Resource Referral / Chronic Care Management: CRR required this visit?  No   CCM required this visit?  No      Plan:     I have personally reviewed and noted the following in the patients chart:   Medical and social history Use of alcohol, tobacco or illicit drugs  Current medications and supplements including opioid prescriptions. Patient is not currently taking opioid prescriptions. Functional ability and status Nutritional status Physical activity Advanced directives List of other physicians Hospitalizations, surgeries, and ER visits in previous 12 months Vitals Screenings to include cognitive, depression, and falls Referrals and appointments  In addition, I have reviewed and discussed with patient certain preventive protocols, quality metrics, and best practice recommendations. A written personalized care plan for preventive services as well as general preventive health recommendations were provided to patient.     Dionisio David, LPN   [id number]   Nurse Notes: none

## 2021-10-15 NOTE — Patient Instructions (Signed)
Keith Bates , Thank you for taking time to come for your Medicare Wellness Visit. I appreciate your ongoing commitment to your health goals. Please review the following plan we discussed and let me know if I can assist you in the future.   Screening recommendations/referrals: Colonoscopy: aged out Recommended yearly ophthalmology/optometry visit for glaucoma screening and checkup Recommended yearly dental visit for hygiene and checkup  Vaccinations: Influenza vaccine: 06/22/21 Pneumococcal vaccine: n/d Tdap vaccine: 03/29/13 Shingles vaccine: n/d   Covid-19: 08/23/19, 09/13/19, 07/14/20  Advanced directives: no  Conditions/risks identified: none  Next appointment: Follow up in one year for your annual wellness visit. -10/21/22 @ 3:20pm in person Preventive Care 83 Years and Older, Male Preventive care refers to lifestyle choices and visits with your health care provider that can promote health and wellness. What does preventive care include? A yearly physical exam. This is also called an annual well check. Dental exams once or twice a year. Routine eye exams. Ask your health care provider how often you should have your eyes checked. Personal lifestyle choices, including: Daily care of your teeth and gums. Regular physical activity. Eating a healthy diet. Avoiding tobacco and drug use. Limiting alcohol use. Practicing safe sex. Taking low doses of aspirin every day. Taking vitamin and mineral supplements as recommended by your health care provider. What happens during an annual well check? The services and screenings done by your health care provider during your annual well check will depend on your age, overall health, lifestyle risk factors, and family history of disease. Counseling  Your health care provider may ask you questions about your: Alcohol use. Tobacco use. Drug use. Emotional well-being. Home and relationship well-being. Sexual activity. Eating habits. History of  falls. Memory and ability to understand (cognition). Work and work Statistician. Screening  You may have the following tests or measurements: Height, weight, and BMI. Blood pressure. Lipid and cholesterol levels. These may be checked every 5 years, or more frequently if you are over 21 years old. Skin check. Lung cancer screening. You may have this screening every year starting at age 15 if you have a 30-pack-year history of smoking and currently smoke or have quit within the past 15 years. Fecal occult blood test (FOBT) of the stool. You may have this test every year starting at age 64. Flexible sigmoidoscopy or colonoscopy. You may have a sigmoidoscopy every 5 years or a colonoscopy every 10 years starting at age 71. Prostate cancer screening. Recommendations will vary depending on your family history and other risks. Hepatitis C blood test. Hepatitis B blood test. Sexually transmitted disease (STD) testing. Diabetes screening. This is done by checking your blood sugar (glucose) after you have not eaten for a while (fasting). You may have this done every 1-3 years. Abdominal aortic aneurysm (AAA) screening. You may need this if you are a current or former smoker. Osteoporosis. You may be screened starting at age 77 if you are at high risk. Talk with your health care provider about your test results, treatment options, and if necessary, the need for more tests. Vaccines  Your health care provider may recommend certain vaccines, such as: Influenza vaccine. This is recommended every year. Tetanus, diphtheria, and acellular pertussis (Tdap, Td) vaccine. You may need a Td booster every 10 years. Zoster vaccine. You may need this after age 27. Pneumococcal 13-valent conjugate (PCV13) vaccine. One dose is recommended after age 92. Pneumococcal polysaccharide (PPSV23) vaccine. One dose is recommended after age 14. Talk to your health care provider  about which screenings and vaccines you need and  how often you need them. This information is not intended to replace advice given to you by your health care provider. Make sure you discuss any questions you have with your health care provider. Document Released: 08/21/2015 Document Revised: 04/13/2016 Document Reviewed: 05/26/2015 Elsevier Interactive Patient Education  2017 [place] Prevention in the Home Falls can cause injuries. They can happen to people of all ages. There are many things you can do to make your home safe and to help prevent falls. What can I do on the outside of my home? Regularly fix the edges of walkways and driveways and fix any cracks. Remove anything that might make you trip as you walk through a door, such as a raised step or threshold. Trim any bushes or trees on the path to your home. Use bright outdoor lighting. Clear any walking paths of anything that might make someone trip, such as rocks or tools. Regularly check to see if handrails are loose or broken. Make sure that both sides of any steps have handrails. Any raised decks and porches should have guardrails on the edges. Have any leaves, snow, or ice cleared regularly. Use sand or salt on walking paths during winter. Clean up any spills in your garage right away. This includes oil or grease spills. What can I do in the bathroom? Use night lights. Install grab bars by the toilet and in the tub and shower. Do not use towel bars as grab bars. Use non-skid mats or decals in the tub or shower. If you need to sit down in the shower, use a plastic, non-slip stool. Keep the floor dry. Clean up any water that spills on the floor as soon as it happens. Remove soap buildup in the tub or shower regularly. Attach bath mats securely with double-sided non-slip rug tape. Do not have throw rugs and other things on the floor that can make you trip. What can I do in the bedroom? Use night lights. Make sure that you have a light by your bed that is easy to  reach. Do not use any sheets or blankets that are too big for your bed. They should not hang down onto the floor. Have a firm chair that has side arms. You can use this for support while you get dressed. Do not have throw rugs and other things on the floor that can make you trip. What can I do in the kitchen? Clean up any spills right away. Avoid walking on wet floors. Keep items that you use a lot in easy-to-reach places. If you need to reach something above you, use a strong step stool that has a grab bar. Keep electrical cords out of the way. Do not use floor polish or wax that makes floors slippery. If you must use wax, use non-skid floor wax. Do not have throw rugs and other things on the floor that can make you trip. What can I do with my stairs? Do not leave any items on the stairs. Make sure that there are handrails on both sides of the stairs and use them. Fix handrails that are broken or loose. Make sure that handrails are as long as the stairways. Check any carpeting to make sure that it is firmly attached to the stairs. Fix any carpet that is loose or worn. Avoid having throw rugs at the top or bottom of the stairs. If you do have throw rugs, attach them to the floor  with carpet tape. Make sure that you have a light switch at the top of the stairs and the bottom of the stairs. If you do not have them, ask someone to add them for you. What else can I do to help prevent falls? Wear shoes that: Do not have high heels. Have rubber bottoms. Are comfortable and fit you well. Are closed at the toe. Do not wear sandals. If you use a stepladder: Make sure that it is fully opened. Do not climb a closed stepladder. Make sure that both sides of the stepladder are locked into place. Ask someone to hold it for you, if possible. Clearly mark and make sure that you can see: Any grab bars or handrails. First and last steps. Where the edge of each step is. Use tools that help you move  around (mobility aids) if they are needed. These include: Canes. Walkers. Scooters. Crutches. Turn on the lights when you go into a dark area. Replace any light bulbs as soon as they burn out. Set up your furniture so you have a clear path. Avoid moving your furniture around. If any of your floors are uneven, fix them. If there are any pets around you, be aware of where they are. Review your medicines with your doctor. Some medicines can make you feel dizzy. This can increase your chance of falling. Ask your doctor what other things that you can do to help prevent falls. This information is not intended to replace advice given to you by your health care provider. Make sure you discuss any questions you have with your health care provider. Document Released: 05/21/2009 Document Revised: 12/31/2015 Document Reviewed: 08/29/2014 Elsevier Interactive Patient Education  2017 [employer].

## 2021-10-22 ENCOUNTER — Ambulatory Visit: Payer: Medicare Other

## 2022-01-10 ENCOUNTER — Encounter: Payer: Self-pay | Admitting: Family Medicine

## 2022-01-10 ENCOUNTER — Ambulatory Visit (INDEPENDENT_AMBULATORY_CARE_PROVIDER_SITE_OTHER): Payer: Medicare Other | Admitting: Family Medicine

## 2022-01-10 VITALS — BP 130/60 | HR 63 | Ht 71.0 in | Wt 150.6 lb

## 2022-01-10 DIAGNOSIS — R7303 Prediabetes: Secondary | ICD-10-CM

## 2022-01-10 DIAGNOSIS — E7800 Pure hypercholesterolemia, unspecified: Secondary | ICD-10-CM

## 2022-01-10 DIAGNOSIS — Z8546 Personal history of malignant neoplasm of prostate: Secondary | ICD-10-CM | POA: Diagnosis not present

## 2022-01-10 DIAGNOSIS — N3001 Acute cystitis with hematuria: Secondary | ICD-10-CM | POA: Diagnosis not present

## 2022-01-10 LAB — POCT URINALYSIS DIPSTICK
Bilirubin, UA: NEGATIVE
Glucose, UA: NEGATIVE
Ketones, UA: NEGATIVE
Nitrite, UA: NEGATIVE
Protein, UA: POSITIVE — AB
Spec Grav, UA: 1.025 (ref 1.010–1.025)
Urobilinogen, UA: 0.2 E.U./dL
pH, UA: 5 (ref 5.0–8.0)

## 2022-01-10 MED ORDER — SULFAMETHOXAZOLE-TRIMETHOPRIM 800-160 MG PO TABS: 1.0000 | ORAL_TABLET | Freq: Two times a day (BID) | ORAL | 0 refills | Status: AC

## 2022-01-10 NOTE — Progress Notes (Signed)
Subjective:    Patient ID: Keith Bates, male    DOB: 1940-08-13, 81 y.o.   MRN: [id number]  Keith Bates is a 81 y.o. male presenting on 01/10/2022 for Hematuria   HPI  Pre-Diabetes: Reports concern with poor diet lately Last result A1c 6.4 due today CBGs: Not checking Meds: Never on meds Not on ACEi/ARB Lifestyle: - Diet (Improved diet, veggies, reduced fried foods, reduced sweets still) - Exercise (improve regular exercise outdoor mowing and active) Denies hypoglycemia  UTI Admits darker urine at times. He had UTI back in 06/2021, urine culture was negative and treated with Cipro. He says did NOT resolve it, still had problem since that time. Not passing blood in urine. Followed by [facility], history of prostate cancer, last seen 10/2021. History of kidney stones in past. Has not passed any  HYPERLIPIDEMIA: - Reports no concerns. Last lipid panel 06/2021 increased LDL >176 Taking Fish Oil occasional - Currently he stopped the Red Yeast Rice, tolerating well without side effects or myalgias - Decline statin  Health Maintenance: Declines repeat PNA vaccine     10/15/2021    3:29 PM 09/01/2020    9:19 AM 05/05/2020    9:23 AM  Depression screen PHQ 2/9  Decreased Interest 0 0 0  Down, Depressed, Hopeless 0 0 0  PHQ - 2 Score 0 0 0    Social History   Tobacco Use   Smoking status: Former    Packs/day: 1.50    Years: 10.00    Pack years: 15.00    Types: Cigarettes    Quit date: 06/06/1972    Years since quitting: 49.6   Smokeless tobacco: Former  Scientific laboratory technician Use: Former  Substance Use Topics   Alcohol use: No   Drug use: No    Review of Systems Per HPI unless specifically indicated above     Objective:    BP 130/60   Pulse 63   Ht '5\' 11"'$  (1.803 m)   Wt 150 lb 9.6 oz (68.3 kg)   SpO2 100%   BMI 21.00 kg/m   Wt Readings from Last 3 Encounters:  01/10/22 150 lb 9.6 oz (68.3 kg)  10/15/21 159 lb 9.6 oz (72.4 kg)  07/12/21 155 lb 3.2  oz (70.4 kg)    Physical Exam Vitals and nursing note reviewed.  Constitutional:      General: He is not in acute distress.    Appearance: Normal appearance. He is well-developed. He is not diaphoretic.     Comments: Well-appearing, comfortable, cooperative  HENT:     Head: Normocephalic and atraumatic.  Eyes:     General:        Right eye: No discharge.        Left eye: No discharge.     Conjunctiva/sclera: Conjunctivae normal.  Cardiovascular:     Rate and Rhythm: Normal rate.  Pulmonary:     Effort: Pulmonary effort is normal.  Skin:    General: Skin is warm and dry.     Findings: No erythema or rash.  Neurological:     Mental Status: He is alert and oriented to person, place, and time.  Psychiatric:        Mood and Affect: Mood normal.        Behavior: Behavior normal.        Thought Content: Thought content normal.     Comments: Well groomed, good eye contact, normal speech and thoughts   Results for orders placed or  performed in visit on 01/10/22  POCT Urinalysis Dipstick  Result Value Ref Range   Color, UA Yellow    Clarity, UA Cloudy    Glucose, UA Negative Negative   Bilirubin, UA Negative    Ketones, UA Negative    Spec Grav, UA 1.025 1.010 - 1.025   Blood, UA Large +++    pH, UA 5.0 5.0 - 8.0   Protein, UA Positive (A) Negative   Urobilinogen, UA 0.2 0.2 or 1.0 E.U./dL   Nitrite, UA Negative    Leukocytes, UA Small (1+) (A) Negative   Appearance     Odor        Assessment & Plan:   Problem List Items Addressed This Visit     Pre-diabetes   Hyperlipidemia   History of prostate cancer   Other Visit Diagnoses     Acute cystitis with hematuria    -  Primary   Relevant Medications   sulfamethoxazole-trimethoprim (BACTRIM DS) 800-160 MG tablet   Other Relevant Orders   POCT Urinalysis Dipstick (Completed)   Urine Culture       UTI Based on symptoms, and microscopic hematuria, reviewed UA today Will cover with Bactrim DS BID x 7 days Cannot  rule out nephrolithiasis, has not passed any, has had before Symptoms not consistent with ureterolithiasis Check Urine Culture today Advised if not improving can return to [facility]  PreDM Due for A1c Lab today  Lipid panel due today as well.  Orders Placed This Encounter  Procedures   Urine Culture   POCT Urinalysis Dipstick     Meds ordered this encounter  Medications   sulfamethoxazole-trimethoprim (BACTRIM DS) 800-160 MG tablet    Sig: Take 1 tablet by mouth 2 (two) times daily for 7 days.    Dispense:  14 tablet    Refill:  0     Follow up plan: Return in about 6 months (around 07/12/2022) for 6 month fasting lab only then 1 week later Annual Physical.    Nobie Putnam, DO [facility] 01/10/2022, 8:24 AM

## 2022-01-10 NOTE — Patient Instructions (Addendum)
Thank you for coming to the office today.  Start antibiotic twice a day for Urine infection  Checking urine culture at the lab.  Stay tuned for results.  Checking sugar and cholesterol.  If urine infection does not resolve, please then next check with Urology Dr Tresa Moore to see if they can help.  DUE for FASTING BLOOD WORK (no food or drink after midnight before the lab appointment, only water or coffee without cream/sugar on the morning of)  SCHEDULE "Lab Only" visit in the morning at the clinic for lab draw in 6 MONTHS   - Make sure Lab Only appointment is at about 1 week before your next appointment, so that results will be available  For Lab Results, once available within 2-3 days of blood draw, you can can log in to MyChart online to view your results and a brief explanation. Also, we can discuss results at next follow-up visit.   Please schedule a Follow-up Appointment to: Return in about 6 months (around 07/12/2022) for 6 month fasting lab only then 1 week later Annual Physical.  If you have any other questions or concerns, please feel free to call the office or send a message through [facility]. You may also schedule an earlier appointment if necessary.  Additionally, you may be receiving a survey about your experience at our office within a few days to 1 week by e-mail or mail. We value your feedback.  Nobie Putnam, DO [place]

## 2022-01-11 LAB — HEMOGLOBIN A1C
Hgb A1c MFr Bld: 6 % of total Hgb — ABNORMAL HIGH (ref <5.7)
Mean Plasma Glucose: 126 mg/dL
eAG (mmol/L): 7 mmol/L

## 2022-01-11 LAB — LIPID PANEL
Cholesterol: 184 mg/dL (ref <200)
HDL: 63 mg/dL (ref >=40)
LDL Cholesterol (Calc): 103 mg/dL (calc) — ABNORMAL HIGH
Non-HDL Cholesterol (Calc): 121 mg/dL (calc) (ref <130)
Total CHOL/HDL Ratio: 2.9 (calc) (ref <5.0)
Triglycerides: 87 mg/dL (ref <150)

## 2022-01-11 LAB — URINE CULTURE
MICRO NUMBER:: 13484582
Result:: NO GROWTH
SPECIMEN QUALITY:: ADEQUATE

## 2022-04-08 ENCOUNTER — Other Ambulatory Visit: Payer: Self-pay | Admitting: Family Medicine

## 2022-04-08 DIAGNOSIS — M159 Polyosteoarthritis, unspecified: Secondary | ICD-10-CM

## 2022-04-08 DIAGNOSIS — M06031 Rheumatoid arthritis without rheumatoid factor, right wrist: Secondary | ICD-10-CM

## 2022-04-08 NOTE — Telephone Encounter (Signed)
Requested Prescriptions  Pending Prescriptions Disp Refills  . meloxicam (MOBIC) 7.5 MG tablet [Pharmacy Med Name: MELOXICAM 7.5 MG TABLET] 30 tablet 3    Sig: TAKE 1 TABLET (7.5 MG TOTAL) BY MOUTH DAILY AS NEEDED FOR PAIN. UP TO 1-2 WEEKS AS NEEDED     Analgesics:  COX2 Inhibitors Failed - 04/08/2022  2:05 AM      Failed - Manual Review: Labs are only required if the patient has taken medication for more than 8 weeks.      Failed - Cr in normal range and within 360 days    Creat  Date Value Ref Range Status  07/06/2021 0.69 (L) 0.70 - 1.28 mg/dL Final         Passed - HGB in normal range and within 360 days    Hemoglobin  Date Value Ref Range Status  07/06/2021 14.3 13.2 - 17.1 g/dL Final         Passed - HCT in normal range and within 360 days    HCT  Date Value Ref Range Status  07/06/2021 42.5 38.5 - 50.0 % Final         Passed - AST in normal range and within 360 days    AST  Date Value Ref Range Status  07/06/2021 17 10 - 35 U/L Final         Passed - ALT in normal range and within 360 days    ALT  Date Value Ref Range Status  07/06/2021 15 9 - 46 U/L Final         Passed - eGFR is 30 or above and within 360 days    GFR, Est African American  Date Value Ref Range Status  04/27/2020 99 > OR = 60 mL/min/1.25m Final   GFR calc Af Amer  Date Value Ref Range Status  05/18/2021 119.5  Final   GFR, Est Non African American  Date Value Ref Range Status  04/27/2020 85 > OR = 60 mL/min/1.726mFinal   eGFR  Date Value Ref Range Status  07/06/2021 94 > OR = 60 mL/min/1.7339minal    Comment:    The eGFR is based on the CKD-EPI 2021 equation. To calculate  the new eGFR from a previous Creatinine or Cystatin C result, go to https://www.kidney.org/professionals/ kdoqi/gfr%5Fcalculator          Passed - Patient is not pregnant      Passed - Valid encounter within last 12 months    Recent Outpatient Visits          2 months ago Acute cystitis with hematuria    [facility]   9 months ago Annual physical exam   [facility]   9 months ago Acute cystitis with hematuria   [facility]   9 months ago Acute right ankle pain   [facility]   1 year ago Pre-diabetes   [facility]      Future Appointments            In 3 months [facility]

## 2022-04-18 ENCOUNTER — Other Ambulatory Visit: Payer: Self-pay | Admitting: Family Medicine

## 2022-04-18 DIAGNOSIS — J3089 Other allergic rhinitis: Secondary | ICD-10-CM

## 2022-04-19 NOTE — Telephone Encounter (Signed)
Requested Prescriptions  Pending Prescriptions Disp Refills  . fluticasone (FLONASE) 50 MCG/ACT nasal spray [Pharmacy Med Name: FLUTICASONE PROP 50 MCG SPRAY] 48 mL 2    Sig: PLACE 2 SPRAYS INTO BOTH NOSTRILS DAILY. USE FOR 4-6 WEEKS THEN STOP AND USE SEASONALLY OR AS NEEDED     Ear, Nose, and Throat: Nasal Preparations - Corticosteroids Passed - 04/18/2022  1:08 PM      Passed - Valid encounter within last 12 months    Recent Outpatient Visits          3 months ago Acute cystitis with hematuria   [place], DO   9 months ago Annual physical exam   [facility] Olin Hauser, DO   9 months ago Acute cystitis with hematuria   [place], DO   10 months ago Acute right ankle pain   [facility] Olin Hauser, DO   1 year ago Pre-diabetes   [facility] [place], Devonne Doughty, DO      Future Appointments            In 2 months Parks Ranger, Devonne Doughty, [facility], [facility]

## 2022-04-21 ENCOUNTER — Encounter: Payer: Self-pay | Admitting: Internal Medicine

## 2022-04-21 ENCOUNTER — Ambulatory Visit
Admission: RE | Admit: 2022-04-21 | Discharge: 2022-04-21 | Disposition: A | Discharge status: Home / Self Care | Payer: Medicare Other | Attending: Internal Medicine | Admitting: Internal Medicine

## 2022-04-21 ENCOUNTER — Ambulatory Visit (INDEPENDENT_AMBULATORY_CARE_PROVIDER_SITE_OTHER): Payer: Medicare Other | Admitting: Internal Medicine

## 2022-04-21 ENCOUNTER — Ambulatory Visit
Admission: RE | Admit: 2022-04-21 | Discharge: 2022-04-21 | Disposition: A | Discharge status: Home / Self Care | Payer: Medicare Other | Source: Ambulatory Visit | Attending: Internal Medicine | Admitting: Internal Medicine

## 2022-04-21 VITALS — BP 146/80 | HR 93 | Temp 97.3°F | Wt 142.0 lb

## 2022-04-21 DIAGNOSIS — M25431 Effusion, right wrist: Secondary | ICD-10-CM | POA: Insufficient documentation

## 2022-04-21 DIAGNOSIS — W010XXA Fall on same level from slipping, tripping and stumbling without subsequent striking against object, initial encounter: Secondary | ICD-10-CM

## 2022-04-21 DIAGNOSIS — M25531 Pain in right wrist: Secondary | ICD-10-CM

## 2022-04-21 DIAGNOSIS — S0091XA Abrasion of unspecified part of head, initial encounter: Secondary | ICD-10-CM | POA: Diagnosis not present

## 2022-04-21 MED ORDER — HYDROCODONE-ACETAMINOPHEN 5-325 MG PO TABS: 1.0000 | ORAL_TABLET | Freq: Three times a day (TID) | ORAL | 0 refills | Status: DC | PRN

## 2022-04-21 NOTE — Patient Instructions (Signed)
RICE Therapy for Routine Care of Injuries Many injuries can be cared for with rest, ice, compression, and elevation (RICE therapy). This includes: Resting the injured body part. Putting ice on the injury. Putting pressure (compression) on the injury. Raising the injured part (elevation). Using RICE therapy can help to lessen pain and swelling. Supplies needed: Ice. Plastic bag. Towel. Elastic bandage. Pillow or pillows to raise your injured body part. How to care for your injury with RICE therapy Rest Try to rest the injured part of your body. You can go back to your normal activities when your doctor says it is okay to do them and when you can do them without pain. If you rest the injury too much, it may not heal as well. Some injuries heal better with early movement instead of resting for too long. Ask your doctor if you should do exercises to help your injury get better. Ice  If told, put ice on the injured area. To do this: Put ice in a plastic bag. Place a towel between your skin and the bag. Leave the ice on for 20 minutes, 2-3 times a day. Take off the ice if your skin turns bright red. This is very important. If you cannot feel pain, heat, or cold, you have a greater risk of damage to the area. Do not put ice on your bare skin. Use ice for as many days as your doctor tells you to use it. Compression Put pressure on the injured area. This can be done with an elastic bandage. If this type of bandage has been put on your injury: Follow instructions on the package the bandage came in about how to use it. Do not wrap the bandage too tightly. Wrap the bandage more loosely if part of your body beyond the bandage is blue, swollen, cold, painful, or loses feeling. Take off the bandage and put it on again every 3-4 hours or as told by your doctor. See your doctor if the bandage seems to make your problems worse.  Elevation Raise the injured area above the level of your heart while you  are sitting or lying down. Follow these instructions at home: If your symptoms get worse or last a long time, make a follow-up appointment with your doctor. You may need to have imaging tests, such as X-rays or an MRI. If you have imaging tests, ask how to get your results when they are ready. Return to your normal activities when your doctor says that it is safe. Keep all follow-up visits. Contact a doctor if: You keep having pain and swelling. Your symptoms get worse. Get help right away if: You have sudden, very bad pain at your injury or lower than your injury. You have redness or more swelling around your injury. You have tingling or numbness at your injury or lower than your injury, and it does not go away when you take off the bandage. Summary Many injuries can be cared for using rest, ice, compression, and elevation (RICE therapy). You can go back to your normal activities when your doctor says it is okay and when you can do them without pain. Put ice on the injured area as told by your doctor. Get help if your symptoms get worse or if you keep having pain and swelling. This information is not intended to replace advice given to you by your health care provider. Make sure you discuss any questions you have with your health care provider. Document Revised: 05/14/2020 Document Reviewed: 05/14/2020   Elsevier Patient Education  2023 Elsevier Inc.  

## 2022-04-21 NOTE — Progress Notes (Signed)
Subjective:    Patient ID: Keith Bates, male    DOB: 01-11-1941, 81 y.o.   MRN: [id number]  HPI  Patient presents to clinic today with complaint of right wrist pain and swelling.  This started after a fall that occurred yesterday.  He reports that he tripped and tried to catch himself from falling on his right side.  He describes the right wrist pain as throbbing but can be sharp with certain movements.  He has reported significant swelling and decreased grip of his right hand.  He reports he did hit the right side of his face but denies headache, dizziness, vision changes or difficulty with speech.  He reports that he is not on blood thinners.  Review of Systems     Past Medical History:  Diagnosis Date   Arthritis    DDD (degenerative disc disease)    Enlarged prostate    Environmental allergies    GERD (gastroesophageal reflux disease)    Hx of ulcer disease    Loss of weight    Nocturia    Prostate cancer ([place])    Unspecified hemorrhoids without mention of complication     Current Outpatient Medications  Medication Sig Dispense Refill   acetaminophen (TYLENOL) 500 MG tablet Take 1 tablet (500 mg total) by mouth every 6 (six) hours as needed. 30 tablet 0   fluticasone (FLONASE) 50 MCG/ACT nasal spray PLACE 2 SPRAYS INTO BOTH NOSTRILS DAILY. USE FOR 4-6 WEEKS THEN STOP AND USE SEASONALLY OR AS NEEDED 48 mL 1   hydrocortisone (ANUSOL-HC) 25 MG suppository Place 1 suppository (25 mg total) rectally 2 (two) times daily as needed for hemorrhoids or anal itching. For 7 days 24 suppository 2   meloxicam (MOBIC) 7.5 MG tablet TAKE 1 TABLET (7.5 MG TOTAL) BY MOUTH DAILY AS NEEDED FOR PAIN. UP TO 1-2 WEEKS AS NEEDED 90 tablet 0   Multiple Vitamin (MULTIVITAMIN) tablet Take 1 tablet by mouth daily.     Red Yeast Rice Extract (RED YEAST RICE PO) Take by mouth.     No current facility-administered medications for this visit.    Allergies  Allergen Reactions   Other Rash    Styrofoam -  rash and "almost went blind"   Plasticized Base [Plastibase] Rash   Penicillins Rash    Family History  Problem Relation Age of Onset   Prostate cancer Father        with mets to nodes   Cancer Brother        colon and prostate   Heart attack Mother     Social History   Socioeconomic History   Marital status: Married    Spouse name: Not on file   Number of children: Not on file   Years of education: Not on file   Highest education level: Not on file  Occupational History   Occupation: retired  Tobacco Use   Smoking status: Former    Packs/day: 1.50    Years: 10.00    Total pack years: 15.00    Types: Cigarettes    Quit date: 06/06/1972    Years since quitting: 49.9   Smokeless tobacco: Former  Scientific laboratory technician Use: Former  Substance and Sexual Activity   Alcohol use: No   Drug use: No   Sexual activity: Not Currently  Other Topics Concern   Not on file  Social History Narrative   Not on file   Social Determinants of Health   Financial Resource Strain: Low  Risk  (10/15/2021)   Overall Financial Resource Strain (CARDIA)    Difficulty of Paying Living Expenses: Not hard at all  Food Insecurity: No Food Insecurity (10/15/2021)   Hunger Vital Sign    Worried About Running Out of Food in the Last Year: Never true    Ran Out of Food in the Last Year: Never true  Transportation Needs: No Transportation Needs (10/15/2021)   PRAPARE - Hydrologist (Medical): No    Lack of Transportation (Non-Medical): No  Physical Activity: Insufficiently Active (10/15/2021)   Exercise Vital Sign    Days of Exercise per Week: 7 days    Minutes of Exercise per Session: 20 min  Stress: No Stress Concern Present (10/15/2021)   [employer]    Feeling of Stress : Not at all  Social Connections: Moderately Integrated (10/15/2021)   Social Connection and Isolation Panel [NHANES]    Frequency of  Communication with Friends and Family: More than three times a week    Frequency of Social Gatherings with Friends and Family: More than three times a week    Attends Religious Services: More than 4 times per year    Active Member of [employer] or Organizations: No    Attends Archivist Meetings: Never    Marital Status: Married  Human resources officer Violence: Not At Risk (10/15/2021)   Humiliation, Afraid, Rape, and Kick questionnaire    Fear of Current or Ex-Partner: No    Emotionally Abused: No    Physically Abused: No    Sexually Abused: No     Constitutional: Denies fever, malaise, fatigue, headache or abrupt weight changes.  HEENT: Denies eye pain, eye redness, ear pain, ringing in the ears, wax buildup, runny nose, nasal congestion, bloody nose, or sore throat. Respiratory: Denies difficulty breathing, shortness of breath, cough or sputum production.   Musculoskeletal: Patient reports wrist pain and swelling, decreased range of motion.  Denies difficulty with gait, muscle pain.  Skin: Denies redness, rashes, lesions or ulcercations.  Neurological: Denies dizziness, difficulty with memory, difficulty with speech or problems with balance and coordination.   No other specific complaints in a complete review of systems (except as listed in HPI above).  Objective:   Physical Exam   BP (!) 142/80 (BP Location: Left Arm, Patient Position: Sitting, Cuff Size: Normal)   Pulse 93   Temp (!) 97.3 F (36.3 C) (Temporal)   Wt 142 lb (64.4 kg)   SpO2 99%   BMI 19.80 kg/m  Wt Readings from Last 3 Encounters:  04/21/22 142 lb (64.4 kg)  01/10/22 150 lb 9.6 oz (68.3 kg)  10/15/21 159 lb 9.6 oz (72.4 kg)    General: Appears his stated age, well developed, well nourished in NAD. Skin: Abrasion noted to right side of scalp. HEENT: Head: normal shape and size; Eyes: sclera white, no icterus, conjunctiva pink, PERRLA and EOMs intact;  Cardiovascular: Normal rate and rhythm. S1,S2 noted.   No murmur, rubs or gallops noted.  Pulmonary/Chest: Normal effort and positive vesicular breath sounds. No respiratory distress. No wheezes, rales or ronchi noted.  Musculoskeletal: Decreased flexion, extension, rotation of the right wrist.  Generalized pain with palpation of the carpals.  1+ swelling noted over the carpals.  No pain with palpation over the metacarpals or distal ulna/radius.  Handgrips unequal, L >R. Neurological: Alert and oriented. Coordination normal.    BMET    Component Value Date/Time   NA  142 07/06/2021 0753   NA 146 05/18/2021 0000   K 3.9 07/06/2021 0753   CL 106 07/06/2021 0753   CO2 26 07/06/2021 0753   GLUCOSE 96 07/06/2021 0753   BUN 14 07/06/2021 0753   BUN 21 05/18/2021 0000   CREATININE 0.69 (L) 07/06/2021 0753   CALCIUM 9.6 07/06/2021 0753   GFRNONAA 85 04/27/2020 0837   GFRAA 119.5 05/18/2021 0000   GFRAA 99 04/27/2020 0837    Lipid Panel     Component Value Date/Time   CHOL 184 01/10/2022 0841   TRIG 87 01/10/2022 0841   HDL 63 01/10/2022 0841   CHOLHDL 2.9 01/10/2022 0841   LDLCALC 103 (H) 01/10/2022 0841    CBC    Component Value Date/Time   WBC 6.6 07/06/2021 0753   RBC 4.69 07/06/2021 0753   HGB 14.3 07/06/2021 0753   HCT 42.5 07/06/2021 0753   PLT 225 07/06/2021 0753   MCV 90.6 07/06/2021 0753   MCH 30.5 07/06/2021 0753   MCHC 33.6 07/06/2021 0753   RDW 13.5 07/06/2021 0753   LYMPHSABS 2,402 07/06/2021 0753   MONOABS 1.0 07/29/2013 0405   EOSABS 139 07/06/2021 0753   BASOSABS 53 07/06/2021 0753    Hgb A1C Lab Results  Component Value Date   HGBA1C 6.0 (H) 01/10/2022           Assessment & Plan:   Right Wrist Pain and Swelling, Abrasion of Head s/p Fall due to Tripping:  X-ray wrist today-no obvious fracture per my read but will send to radiology for stat read ACE wrap applied Encouraged ice and elevation RX for Norco 5-325 mg TID x 3 days We will have him follow-up with [facility] if needed pending xray  read  Follow-up with your PCP as previously scheduled Webb Silversmith, NP

## 2022-04-26 ENCOUNTER — Telehealth: Payer: Self-pay

## 2022-04-26 NOTE — Telephone Encounter (Signed)
Pt returned our call. Shared provider's note.  Pt will go to [facility] to be seen.  Jearld Fenton, NP  04/21/2022 10:20 AM EDT     X-ray of the right wrist does not show any obvious fracture.  There is concern for dislocation involving the capitate or lunate which may be a chronic issue.  At this point I would recommend he go to [facility] walk-in clinic on [facility] for further evaluation.

## 2022-07-13 ENCOUNTER — Ambulatory Visit: Payer: Medicare Other | Admitting: Family Medicine

## 2022-07-15 ENCOUNTER — Other Ambulatory Visit: Payer: Self-pay | Admitting: Family Medicine

## 2022-07-15 DIAGNOSIS — M159 Polyosteoarthritis, unspecified: Secondary | ICD-10-CM

## 2022-07-15 DIAGNOSIS — M06031 Rheumatoid arthritis without rheumatoid factor, right wrist: Secondary | ICD-10-CM

## 2022-07-15 NOTE — Telephone Encounter (Signed)
Requested medications are due for refill today.  yes  Requested medications are on the active medications list.  yes  Last refill. 04/08/2022 #90 0 rf  Future visit scheduled.   no  Notes to clinic.  Labs are expired.    Requested Prescriptions  Pending Prescriptions Disp Refills   meloxicam (MOBIC) 7.5 MG tablet [Pharmacy Med Name: MELOXICAM 7.5 MG TABLET] 90 tablet 0    Sig: TAKE 1 TABLET (7.5 MG TOTAL) BY MOUTH DAILY AS NEEDED FOR PAIN. UP TO 1-2 WEEKS AS NEEDED     Analgesics:  COX2 Inhibitors Failed - 07/15/2022  1:45 AM      Failed - Manual Review: Labs are only required if the patient has taken medication for more than 8 weeks.      Failed - HGB in normal range and within 360 days    Hemoglobin  Date Value Ref Range Status  07/06/2021 14.3 13.2 - 17.1 g/dL Final         Failed - Cr in normal range and within 360 days    Creat  Date Value Ref Range Status  07/06/2021 0.69 (L) 0.70 - 1.28 mg/dL Final         Failed - HCT in normal range and within 360 days    HCT  Date Value Ref Range Status  07/06/2021 42.5 38.5 - 50.0 % Final         Failed - AST in normal range and within 360 days    AST  Date Value Ref Range Status  07/06/2021 17 10 - 35 U/L Final         Failed - ALT in normal range and within 360 days    ALT  Date Value Ref Range Status  07/06/2021 15 9 - 46 U/L Final         Failed - eGFR is 30 or above and within 360 days    GFR, Est African American  Date Value Ref Range Status  04/27/2020 99 > OR = 60 mL/min/1.21m Final   GFR calc Af Amer  Date Value Ref Range Status  05/18/2021 119.5  Final   GFR, Est Non African American  Date Value Ref Range Status  04/27/2020 85 > OR = 60 mL/min/1.750mFinal   eGFR  Date Value Ref Range Status  07/06/2021 94 > OR = 60 mL/min/1.7346minal    Comment:    The eGFR is based on the CKD-EPI 2021 equation. To calculate  the new eGFR from a previous Creatinine or Cystatin C result, go to  https://www.kidney.org/professionals/ kdoqi/gfr%5Fcalculator          Passed - Patient is not pregnant      Passed - Valid encounter within last 12 months    Recent Outpatient Visits           2 months ago Pain and swelling of right wrist   [facility]   6 months ago Acute cystitis with hematuria   [facility]   1 year ago Annual physical exam   [facility]   1 year ago Acute cystitis with hematuria   [facility]   1 year ago Acute right ankle pain   [place]

## 2022-09-15 ENCOUNTER — Other Ambulatory Visit: Payer: Self-pay | Admitting: Family Medicine

## 2022-09-15 DIAGNOSIS — J3089 Other allergic rhinitis: Secondary | ICD-10-CM

## 2022-09-15 MED ORDER — MOMETASONE FUROATE 50 MCG/ACT NA SUSP: 2.0000 | Freq: Every day | NASAL | 3 refills | Status: DC

## 2022-09-16 ENCOUNTER — Other Ambulatory Visit: Payer: Self-pay | Admitting: Family Medicine

## 2022-09-16 DIAGNOSIS — J3089 Other allergic rhinitis: Secondary | ICD-10-CM

## 2022-09-16 MED ORDER — FLUNISOLIDE 25 MCG/ACT (0.025%) NA SOLN: 2.0000 | Freq: Two times a day (BID) | NASAL | 12 refills | Status: DC

## 2022-09-16 NOTE — Telephone Encounter (Signed)
Requested medications are due for refill today.  See pharmacy note  Requested medications are on the active medications list.  no  Last refill. Ordered 09/15/2022  Future visit scheduled.   no  Notes to clinic.    Pharmacy comment: Alternative Requested:THE PRESCRIBED MEDICATION IS NOT COVERED BY INSURANCE. PLEASE CONSIDER CHANGING TO ONE OF THE SUGGESTED COVERED ALTERNATIVES.   All Pharmacy Suggested Alternatives:  fluticasone (FLONASE) 50 MCG/ACT nasal spray flunisolide (NASALIDE) 25 MCG/ACT (0.025%) SOLN     Requested Prescriptions  Pending Prescriptions Disp Refills   fluticasone (FLONASE) 50 MCG/ACT nasal spray [Pharmacy Med Name: FLUTICASONE PROP 50 MCG SPRAY]  0     Ear, Nose, and Throat: Nasal Preparations - Corticosteroids Passed - 09/15/2022  3:57 PM      Passed - Valid encounter within last 12 months    Recent Outpatient Visits           4 months ago Pain and swelling of right wrist   [place], NP   8 months ago Acute cystitis with hematuria   [place], DO   1 year ago Annual physical exam   [facility] Olin Hauser, DO   1 year ago Acute cystitis with hematuria   [place], DO   1 year ago Acute right ankle pain   [facility] [place], Devonne Doughty, [place]

## 2022-09-16 NOTE — Telephone Encounter (Signed)
Requested medications are due for refill today.  yes  Requested medications are on the active medications list.  no  Last refill. 09/16/2022  Future visit scheduled.   yes  Notes to clinic.   Pharmacy comment: Alternative Requested:THE PRESCRIBED MEDICATION IS NOT COVERED BY INSURANCE. PLEASE CONSIDER CHANGING TO ONE OF THE SUGGESTED COVERED ALTERNATIVES.   All Pharmacy Suggested Alternatives:  fluticasone (FLONASE) 50 MCG/ACT nasal spray mometasone (NASONEX) 50 MCG/ACT nasal spray      Requested Prescriptions  Pending Prescriptions Disp Refills   fluticasone (FLONASE) 50 MCG/ACT nasal spray [Pharmacy Med Name: FLUTICASONE PROP 50 MCG SPRAY]  0     Ear, Nose, and Throat: Nasal Preparations - Corticosteroids Passed - 09/16/2022  8:55 AM      Passed - Valid encounter within last 12 months    Recent Outpatient Visits           4 months ago Pain and swelling of right wrist   [facility] [place], Coralie Keens, NP   8 months ago Acute cystitis with hematuria   [place], DO   1 year ago Annual physical exam   [facility] Olin Hauser, DO   1 year ago Acute cystitis with hematuria   [place], DO   1 year ago Acute right ankle pain   [facility] [place], Devonne Doughty, [place]

## 2022-12-11 ENCOUNTER — Other Ambulatory Visit: Payer: Self-pay | Admitting: Internal Medicine

## 2022-12-11 DIAGNOSIS — M159 Polyosteoarthritis, unspecified: Secondary | ICD-10-CM

## 2022-12-11 DIAGNOSIS — M06031 Rheumatoid arthritis without rheumatoid factor, right wrist: Secondary | ICD-10-CM

## 2022-12-12 NOTE — Telephone Encounter (Signed)
Requested medications are due for refill today.  Unsure - medication is PRN  Requested medications are on the active medications list.  yes  Last refill. 07/15/2022 #90 0 rf  Future visit scheduled.   no  Notes to clinic.  Labs are expired.    Requested Prescriptions  Pending Prescriptions Disp Refills   meloxicam (MOBIC) 7.5 MG tablet [Pharmacy Med Name: MELOXICAM 7.5 MG TABLET] 90 tablet 0    Sig: TAKE 1 TABLET (7.5 MG TOTAL) BY MOUTH DAILY AS NEEDED FOR PAIN. UP TO 1-2 WEEKS AS NEEDED     Analgesics:  COX2 Inhibitors Failed - 12/11/2022  9:35 AM      Failed - Manual Review: Labs are only required if the patient has taken medication for more than 8 weeks.      Failed - HGB in normal range and within 360 days    Hemoglobin  Date Value Ref Range Status  07/06/2021 14.3 13.2 - 17.1 g/dL Final         Failed - Cr in normal range and within 360 days    Creat  Date Value Ref Range Status  07/06/2021 0.69 (L) 0.70 - 1.28 mg/dL Final         Failed - HCT in normal range and within 360 days    HCT  Date Value Ref Range Status  07/06/2021 42.5 38.5 - 50.0 % Final         Failed - AST in normal range and within 360 days    AST  Date Value Ref Range Status  07/06/2021 17 10 - 35 U/L Final         Failed - ALT in normal range and within 360 days    ALT  Date Value Ref Range Status  07/06/2021 15 9 - 46 U/L Final         Failed - eGFR is 30 or above and within 360 days    GFR, Est African American  Date Value Ref Range Status  04/27/2020 99 > OR = 60 mL/min/1.47m2 Final   GFR calc Af Amer  Date Value Ref Range Status  05/18/2021 119.5  Final   GFR, Est Non African American  Date Value Ref Range Status  04/27/2020 85 > OR = 60 mL/min/1.64m2 Final   eGFR  Date Value Ref Range Status  07/06/2021 94 > OR = 60 mL/min/1.56m2 Final    Comment:    The eGFR is based on the CKD-EPI 2021 equation. To calculate  the new eGFR from a previous Creatinine or Cystatin C result,  go to https://www.kidney.org/professionals/ kdoqi/gfr%5Fcalculator          Passed - Patient is not pregnant      Passed - Valid encounter within last 12 months    Recent Outpatient Visits           7 months ago Pain and swelling of right wrist   [place] [facility] [place], Salvadore [place], NP   11 months ago Acute cystitis with hematuria   [place] [facility] Smitty Cords, DO   1 year ago Annual physical exam   [facility] Smitty Cords, DO   1 year ago Acute cystitis with hematuria   [place] [facility] Smitty Cords, DO   1 year ago Acute right ankle pain   [place] [facility] [place], [name], [place]

## 2022-12-15 ENCOUNTER — Telehealth: Payer: Self-pay | Admitting: Family Medicine

## 2022-12-15 NOTE — Telephone Encounter (Signed)
Contacted [employer] to schedule their annual wellness visit. Appointment made for 12/30/2022.  Verlee Rossetti; Care Guide Ambulatory Clinical Support [facility] Direct Dial: [phone]

## 2022-12-30 ENCOUNTER — Ambulatory Visit (INDEPENDENT_AMBULATORY_CARE_PROVIDER_SITE_OTHER): Payer: Medicare Other

## 2022-12-30 VITALS — Ht 71.0 in | Wt 142.0 lb

## 2022-12-30 DIAGNOSIS — Z0000 Encounter for general adult medical examination without abnormal findings: Secondary | ICD-10-CM | POA: Diagnosis not present

## 2022-12-30 NOTE — Patient Instructions (Signed)
Mr. Keith Bates , Thank you for taking time to come for your Medicare Wellness Visit. I appreciate your ongoing commitment to your health goals. Please review the following plan we discussed and let me know if I can assist you in the future.   These are the goals we discussed:  Goals      DIET - EAT MORE FRUITS AND VEGETABLES     DIET - INCREASE WATER INTAKE     Patient Stated     09/01/2020, stay healthy        This is a list of the screening recommended for you and due dates:  Health Maintenance  Topic Date Due   DTaP/Tdap/Td vaccine (1 - Tdap) Never done   Zoster (Shingles) Vaccine (1 of 2) Never done   Pneumonia Vaccine (2 of 2 - PPSV23 or PCV20) 01/15/2020   COVID-19 Vaccine (4 - 2023-24 season) 04/08/2022   Flu Shot  03/09/2023   Medicare Annual Wellness Visit  12/30/2023   HPV Vaccine  Aged Out    Advanced directives: no  Conditions/risks identified: none  Next appointment: Follow up in one year for your annual wellness visit. 01/05/24 @ 8:45 am by phone  Preventive Care 65 Years and Older, Male  Preventive care refers to lifestyle choices and visits with your health care provider that can promote health and wellness. What does preventive care include? A yearly physical exam. This is also called an annual well check. Dental exams once or twice a year. Routine eye exams. Ask your health care provider how often you should have your eyes checked. Personal lifestyle choices, including: Daily care of your teeth and gums. Regular physical activity. Eating a healthy diet. Avoiding tobacco and drug use. Limiting alcohol use. Practicing safe sex. Taking low doses of aspirin every day. Taking vitamin and mineral supplements as recommended by your health care provider. What happens during an annual well check? The services and screenings done by your health care provider during your annual well check will depend on your age, overall health, lifestyle risk factors, and family  history of disease. Counseling  Your health care provider may ask you questions about your: Alcohol use. Tobacco use. Drug use. Emotional well-being. Home and relationship well-being. Sexual activity. Eating habits. History of falls. Memory and ability to understand (cognition). Work and work Astronomer. Screening  You may have the following tests or measurements: Height, weight, and BMI. Blood pressure. Lipid and cholesterol levels. These may be checked every 5 years, or more frequently if you are over 60 years old. Skin check. Lung cancer screening. You may have this screening every year starting at age 61 if you have a 30-pack-year history of smoking and currently smoke or have quit within the past 15 years. Fecal occult blood test (FOBT) of the stool. You may have this test every year starting at age 86. Flexible sigmoidoscopy or colonoscopy. You may have a sigmoidoscopy every 5 years or a colonoscopy every 10 years starting at age 26. Prostate cancer screening. Recommendations will vary depending on your family history and other risks. Hepatitis C blood test. Hepatitis B blood test. Sexually transmitted disease (STD) testing. Diabetes screening. This is done by checking your blood sugar (glucose) after you have not eaten for a while (fasting). You may have this done every 1-3 years. Abdominal aortic aneurysm (AAA) screening. You may need this if you are a current or former smoker. Osteoporosis. You may be screened starting at age 55 if you are at high risk. Talk  with your health care provider about your test results, treatment options, and if necessary, the need for more tests. Vaccines  Your health care provider may recommend certain vaccines, such as: Influenza vaccine. This is recommended every year. Tetanus, diphtheria, and acellular pertussis (Tdap, Td) vaccine. You may need a Td booster every 10 years. Zoster vaccine. You may need this after age 28. Pneumococcal  13-valent conjugate (PCV13) vaccine. One dose is recommended after age 68. Pneumococcal polysaccharide (PPSV23) vaccine. One dose is recommended after age 60. Talk to your health care provider about which screenings and vaccines you need and how often you need them. This information is not intended to replace advice given to you by your health care provider. Make sure you discuss any questions you have with your health care provider. Document Released: 08/21/2015 Document Revised: 04/13/2016 Document Reviewed: 05/26/2015 Elsevier Interactive Patient Education  2017 [employer].  Fall Prevention in the Home Falls can cause injuries. They can happen to people of all ages. There are many things you can do to make your home safe and to help prevent falls. What can I do on the outside of my home? Regularly fix the edges of walkways and driveways and fix any cracks. Remove anything that might make you trip as you walk through a door, such as a raised step or threshold. Trim any bushes or trees on the path to your home. Use bright outdoor lighting. Clear any walking paths of anything that might make someone trip, such as rocks or tools. Regularly check to see if handrails are loose or broken. Make sure that both sides of any steps have handrails. Any raised decks and porches should have guardrails on the edges. Have any leaves, snow, or ice cleared regularly. Use sand or salt on walking paths during winter. Clean up any spills in your garage right away. This includes oil or grease spills. What can I do in the bathroom? Use night lights. Install grab bars by the toilet and in the tub and shower. Do not use towel bars as grab bars. Use non-skid mats or decals in the tub or shower. If you need to sit down in the shower, use a plastic, non-slip stool. Keep the floor dry. Clean up any water that spills on the floor as soon as it happens. Remove soap buildup in the tub or shower regularly. Attach  bath mats securely with double-sided non-slip rug tape. Do not have throw rugs and other things on the floor that can make you trip. What can I do in the bedroom? Use night lights. Make sure that you have a light by your bed that is easy to reach. Do not use any sheets or blankets that are too big for your bed. They should not hang down onto the floor. Have a firm chair that has side arms. You can use this for support while you get dressed. Do not have throw rugs and other things on the floor that can make you trip. What can I do in the kitchen? Clean up any spills right away. Avoid walking on wet floors. Keep items that you use a lot in easy-to-reach places. If you need to reach something above you, use a strong step stool that has a grab bar. Keep electrical cords out of the way. Do not use floor polish or wax that makes floors slippery. If you must use wax, use non-skid floor wax. Do not have throw rugs and other things on the floor that can make you  trip. What can I do with my stairs? Do not leave any items on the stairs. Make sure that there are handrails on both sides of the stairs and use them. Fix handrails that are broken or loose. Make sure that handrails are as long as the stairways. Check any carpeting to make sure that it is firmly attached to the stairs. Fix any carpet that is loose or worn. Avoid having throw rugs at the top or bottom of the stairs. If you do have throw rugs, attach them to the floor with carpet tape. Make sure that you have a light switch at the top of the stairs and the bottom of the stairs. If you do not have them, ask someone to add them for you. What else can I do to help prevent falls? Wear shoes that: Do not have high heels. Have rubber bottoms. Are comfortable and fit you well. Are closed at the toe. Do not wear sandals. If you use a stepladder: Make sure that it is fully opened. Do not climb a closed stepladder. Make sure that both sides of the  stepladder are locked into place. Ask someone to hold it for you, if possible. Clearly mark and make sure that you can see: Any grab bars or handrails. First and last steps. Where the edge of each step is. Use tools that help you move around (mobility aids) if they are needed. These include: Canes. Walkers. Scooters. Crutches. Turn on the lights when you go into a dark area. Replace any light bulbs as soon as they burn out. Set up your furniture so you have a clear path. Avoid moving your furniture around. If any of your floors are uneven, fix them. If there are any pets around you, be aware of where they are. Review your medicines with your doctor. Some medicines can make you feel dizzy. This can increase your chance of falling. Ask your doctor what other things that you can do to help prevent falls. This information is not intended to replace advice given to you by your health care provider. Make sure you discuss any questions you have with your health care provider. Document Released: 05/21/2009 Document Revised: 12/31/2015 Document Reviewed: 08/29/2014 Elsevier Interactive Patient Education  2017 [employer].

## 2022-12-30 NOTE — Progress Notes (Signed)
I connected with  [employer] on 12/30/22 by a audio enabled telemedicine application and verified that I am speaking with the correct person using two identifiers.  Patient Location: Home  Provider Location: Office/Clinic  I discussed the limitations of evaluation and management by telemedicine. The patient expressed understanding and agreed to proceed.  Subjective:   Keith Bates is a 82 y.o. male who presents for Medicare Annual/Subsequent preventive examination.  Review of Systems     Cardiac Risk Factors include: advanced age (>40men, >18 women);male gender;dyslipidemia     Objective:    There were no vitals filed for this visit. There is no height or weight on file to calculate BMI.     12/30/2022    9:31 AM 10/15/2021    3:32 PM 09/01/2020    9:18 AM 08/27/2019    1:09 PM 07/23/2013    3:31 PM 06/21/2013    6:02 PM 06/17/2013    9:26 AM  Advanced Directives  Does Patient Have a Medical Advance Directive? No No No No Patient does not have advance directive;Patient would not like information Patient does not have advance directive Patient does not have advance directive  Would patient like information on creating a medical advance directive? No - Patient declined No - Patient declined       Pre-existing out of facility DNR order (yellow form or pink MOST form)      No     Current Medications (verified) Outpatient Encounter Medications as of 12/30/2022  Medication Sig   acetaminophen (TYLENOL) 500 MG tablet Take 1 tablet (500 mg total) by mouth every 6 (six) hours as needed.   flunisolide (NASALIDE) 25 MCG/ACT (0.025%) SOLN Place 2 sprays into the nose 2 (two) times daily.   HYDROcodone-acetaminophen (NORCO/VICODIN) 5-325 MG tablet Take 1 tablet by mouth every 8 (eight) hours as needed for moderate pain.   hydrocortisone (ANUSOL-HC) 25 MG suppository Place 1 suppository (25 mg total) rectally 2 (two) times daily as needed for hemorrhoids or anal itching. For 7 days    meloxicam (MOBIC) 7.5 MG tablet TAKE 1 TABLET (7.5 MG TOTAL) BY MOUTH DAILY AS NEEDED FOR PAIN. UP TO 1-2 WEEKS AS NEEDED   Multiple Vitamin (MULTIVITAMIN) tablet Take 1 tablet by mouth daily.   Red Yeast Rice Extract (RED YEAST RICE PO) Take by mouth. (Patient not taking: Reported on 12/30/2022)   No facility-administered encounter medications on file as of 12/30/2022.    Allergies (verified) Other, Plasticized base [plastibase], and Penicillins   History: Past Medical History:  Diagnosis Date   Arthritis    DDD (degenerative disc disease)    Enlarged prostate    Environmental allergies    GERD (gastroesophageal reflux disease)    Hx of ulcer disease    Loss of weight    Nocturia    Prostate cancer (HCC)    Unspecified hemorrhoids without mention of complication    Past Surgical History:  Procedure Laterality Date   COLONOSCOPY  03/13/2013   HEMORRHOID BANDING     LYMPHADENECTOMY Bilateral 06/21/2013   Procedure: LYMPHADENECTOMY  " BILATERAL PELVIC LYMPH NODE DISSECTION ;  Surgeon: Sebastian Ache, MD;  Location: [facility];  Service: Urology;  Laterality: Bilateral;   ROBOT ASSISTED LAPAROSCOPIC RADICAL PROSTATECTOMY N/A 06/21/2013   Procedure: ROBOTIC ASSISTED LAPAROSCOPIC RADICAL PROSTATECTOMY;  Surgeon: Sebastian Ache, MD;  Location: [facility];  Service: Urology;  Laterality: N/A;   Family History  Problem Relation Age of Onset   Prostate cancer Father  with mets to nodes   Cancer Brother        colon and prostate   Heart attack Mother    Social History   Socioeconomic History   Marital status: Married    Spouse name: Not on file   Number of children: Not on file   Years of education: Not on file   Highest education level: Not on file  Occupational History   Occupation: retired  Tobacco Use   Smoking status: Former    Packs/day: 1.50    Years: 10.00    Additional pack years: 0.00    Total pack years: 15.00    Types: Cigarettes    Quit date: 06/06/1972     Years since quitting: 50.6   Smokeless tobacco: Former  Building services engineer Use: Former  Substance and Sexual Activity   Alcohol use: No   Drug use: No   Sexual activity: Not Currently  Other Topics Concern   Not on file  Social History Narrative   Not on file   Social Determinants of Health   Financial Resource Strain: Low Risk  (12/30/2022)   Overall Financial Resource Strain (CARDIA)    Difficulty of Paying Living Expenses: Not hard at all  Food Insecurity: No Food Insecurity (12/30/2022)   Hunger Vital Sign    Worried About Running Out of Food in the Last Year: Never true    Ran Out of Food in the Last Year: Never true  Transportation Needs: No Transportation Needs (12/30/2022)   PRAPARE - Administrator, Civil Service (Medical): No    Lack of Transportation (Non-Medical): No  Physical Activity: Insufficiently Active (12/30/2022)   Exercise Vital Sign    Days of Exercise per Week: 7 days    Minutes of Exercise per Session: 20 min  Stress: No Stress Concern Present (12/30/2022)   [employer] of Occupational Health - Occupational Stress Questionnaire    Feeling of Stress : Not at all  Social Connections: Socially Integrated (12/30/2022)   Social Connection and Isolation Panel [NHANES]    Frequency of Communication with Friends and Family: More than three times a week    Frequency of Social Gatherings with Friends and Family: Once a week    Attends Religious Services: More than 4 times per year    Active Member of [employer] or Organizations: Yes    Attends Banker Meetings: Never    Marital Status: Married    Tobacco Counseling Counseling given: Not Answered   Clinical Intake:  Pre-visit preparation completed: Yes  Pain : No/denies pain     Nutritional Risks: None Diabetes: No  How often do you need to have someone help you when you read instructions, pamphlets, or other written materials from your doctor or pharmacy?: 1 -  Never  Diabetic?no  Interpreter Needed?: No  Information entered by :: Kennedy Bucker, LPN   Activities of Daily Living    12/30/2022    9:33 AM  In your present state of health, do you have any difficulty performing the following activities:  Hearing? 0  Vision? 0  Difficulty concentrating or making decisions? 0  Walking or climbing stairs? 0  Dressing or bathing? 0  Doing errands, shopping? 0  Preparing Food and eating ? N  Using the Toilet? N  In the past six months, have you accidently leaked urine? N  Do you have problems with loss of bowel control? N  Managing your Medications? N  Managing your Finances?  N  Housekeeping or managing your Housekeeping? N    Patient Care Team: Smitty Cords, DO as PCP - General (Family Medicine) Kieth Brightly, MD (General Surgery)  Indicate any recent Medical Services you may have received from other than [facility] providers in the past year (date may be approximate).     Assessment:   This is a routine wellness examination for [place].  Hearing/Vision screen Hearing Screening - Comments:: No aids Vision Screening - Comments:: Readers- Dr.Woodard  Dietary issues and exercise activities discussed: Current Exercise Habits: Home exercise routine, Type of exercise: walking, Time (Minutes): 20, Frequency (Times/Week): 7, Weekly Exercise (Minutes/Week): 140, Intensity: Mild   Goals Addressed             This Visit's Progress    DIET - INCREASE WATER INTAKE         Depression Screen    12/30/2022    9:29 AM 01/10/2022    9:17 AM 10/15/2021    3:29 PM 09/01/2020    9:19 AM 05/05/2020    9:23 AM 12/13/2019    1:42 PM 08/27/2019    1:08 PM  PHQ 2/9 Scores  PHQ - 2 Score 0 0 0 0 0 0 0  PHQ- 9 Score 0 1         Fall Risk    12/30/2022    9:32 AM 01/10/2022    9:17 AM 10/15/2021    3:32 PM 09/01/2020    9:19 AM 05/05/2020    9:23 AM  Fall Risk   Falls in the past year? 1 0 0 0 0  Number falls in past yr: 0 0 0  0   Injury with Fall? 1 0 0  0  Risk for fall due to : History of fall(s) No Fall Risks No Fall Risks Medication side effect   Follow up Falls evaluation completed;Falls prevention discussed Falls evaluation completed Falls evaluation completed Falls evaluation completed;Education provided;Falls prevention discussed Falls evaluation completed    FALL RISK PREVENTION PERTAINING TO THE HOME:  Any stairs in or around the home? Yes  If so, are there any without handrails? No  Home free of loose throw rugs in walkways, pet beds, electrical cords, etc? Yes  Adequate lighting in your home to reduce risk of falls? Yes   ASSISTIVE DEVICES UTILIZED TO PREVENT FALLS:  Life alert? No  Use of a cane, walker or w/c? No  Grab bars in the bathroom? Yes  Shower chair or bench in shower? No  Elevated toilet seat or a handicapped toilet? No    Cognitive Function:        12/30/2022    9:36 AM 09/01/2020    9:23 AM  6CIT Screen  What Year? 0 points 0 points  What month? 0 points 0 points  What time? 3 points 0 points  Count back from 20 0 points 2 points  Months in reverse 0 points 0 points  Repeat phrase 4 points 6 points  Total Score 7 points 8 points    Immunizations Immunization History  Administered Date(s) Administered   Fluad Quad(high Dose 65+) 05/05/2020, 06/22/2021   Influenza, High Dose Seasonal PF 04/20/2017, 03/28/2019   Influenza-Unspecified 03/30/2019   PFIZER(Purple Top)SARS-COV-2 Vaccination 08/23/2019, 09/13/2019, 07/14/2020   Pneumococcal Conjugate-13 01/15/2019    TDAP status: Due, Education has been provided regarding the importance of this vaccine. Advised may receive this vaccine at local pharmacy or Health Dept. Aware to provide a copy of the vaccination record if obtained from  local pharmacy or Health Dept. Verbalized acceptance and understanding.  Flu Vaccine status: Declined, Education has been provided regarding the importance of this vaccine but patient still  declined. Advised may receive this vaccine at local pharmacy or Health Dept. Aware to provide a copy of the vaccination record if obtained from local pharmacy or Health Dept. Verbalized acceptance and understanding.  Pneumococcal vaccine status: Up to date  Covid-19 vaccine status: Completed vaccines  Qualifies for Shingles Vaccine? Yes   Zostavax completed No   Shingrix Completed?: No.    Education has been provided regarding the importance of this vaccine. Patient has been advised to call insurance company to determine out of pocket expense if they have not yet received this vaccine. Advised may also receive vaccine at local pharmacy or Health Dept. Verbalized acceptance and understanding.  Screening Tests Health Maintenance  Topic Date Due   DTaP/Tdap/Td (1 - Tdap) Never done   Zoster Vaccines- Shingrix (1 of 2) Never done   Pneumonia Vaccine 81+ Years old (2 of 2 - PPSV23 or PCV20) 01/15/2020   COVID-19 Vaccine (4 - 2023-24 season) 04/08/2022   INFLUENZA VACCINE  03/09/2023   Medicare Annual Wellness (AWV)  12/30/2023   HPV VACCINES  Aged Out    Health Maintenance  Health Maintenance Due  Topic Date Due   DTaP/Tdap/Td (1 - Tdap) Never done   Zoster Vaccines- Shingrix (1 of 2) Never done   Pneumonia Vaccine 58+ Years old (2 of 2 - PPSV23 or PCV20) 01/15/2020   COVID-19 Vaccine (4 - 2023-24 season) 04/08/2022    Colorectal cancer screening: No longer required.   Lung Cancer Screening: (Low Dose CT Chest recommended if Age 20-80 years, 30 pack-year currently smoking OR have quit w/in 15years.) does not qualify.   Additional Screening:  Hepatitis C Screening: does not qualify; Completed no  Vision Screening: Recommended annual ophthalmology exams for early detection of glaucoma and other disorders of the eye. Is the patient up to date with their annual eye exam?  Yes  Who is the provider or what is the name of the office in which the patient attends annual eye exams?  Dr.Woodard If pt is not established with a provider, would they like to be referred to a provider to establish care? No .   Dental Screening: Recommended annual dental exams for proper oral hygiene  Community Resource Referral / Chronic Care Management: CRR required this visit?  No   CCM required this visit?  No      Plan:     I have personally reviewed and noted the following in the patient's chart:   Medical and social history Use of alcohol, tobacco or illicit drugs  Current medications and supplements including opioid prescriptions. Patient is currently taking opioid prescriptions. Information provided to patient regarding non-opioid alternatives. Patient advised to discuss non-opioid treatment plan with their provider. Functional ability and status Nutritional status Physical activity Advanced directives List of other physicians Hospitalizations, surgeries, and ER visits in previous 12 months Vitals Screenings to include cognitive, depression, and falls Referrals and appointments  In addition, I have reviewed and discussed with patient certain preventive protocols, quality metrics, and best practice recommendations. A written personalized care plan for preventive services as well as general preventive health recommendations were provided to patient.     Hal Hope, LPN   [id number]/61/[id number]   Nurse Notes: none

## 2023-03-22 ENCOUNTER — Other Ambulatory Visit (HOSPITAL_COMMUNITY): Payer: Self-pay | Admitting: Urology

## 2023-03-22 DIAGNOSIS — C7951 Secondary malignant neoplasm of bone: Secondary | ICD-10-CM

## 2023-03-22 DIAGNOSIS — C61 Malignant neoplasm of prostate: Secondary | ICD-10-CM

## 2023-05-22 ENCOUNTER — Encounter (HOSPITAL_COMMUNITY)
Admission: RE | Admit: 2023-05-22 | Discharge: 2023-05-22 | Disposition: A | Discharge status: Home / Self Care | Payer: Medicare Other | Source: Ambulatory Visit | Attending: Urology | Admitting: Urology

## 2023-05-22 DIAGNOSIS — C61 Malignant neoplasm of prostate: Secondary | ICD-10-CM | POA: Insufficient documentation

## 2023-05-22 DIAGNOSIS — C7951 Secondary malignant neoplasm of bone: Secondary | ICD-10-CM | POA: Insufficient documentation

## 2023-05-22 MED ORDER — TECHNETIUM TC 99M MEDRONATE IV KIT
20.0000 | PACK | Freq: Once | INTRAVENOUS | Status: AC | PRN
  Administered 2023-05-22: 19.3 via INTRAVENOUS

## 2023-09-15 ENCOUNTER — Emergency Department: Payer: Medicare Other

## 2023-09-15 ENCOUNTER — Other Ambulatory Visit: Payer: Self-pay

## 2023-09-15 ENCOUNTER — Inpatient Hospital Stay
Admission: EM | Admit: 2023-09-15 | Discharge: 2023-09-22 | DRG: 286 | Disposition: A | Discharge status: Skilled Nursing Facility | Payer: Medicare Other | Source: Ambulatory Visit | Attending: Internal Medicine | Admitting: Internal Medicine

## 2023-09-15 DIAGNOSIS — E7800 Pure hypercholesterolemia, unspecified: Secondary | ICD-10-CM | POA: Diagnosis not present

## 2023-09-15 DIAGNOSIS — I2584 Coronary atherosclerosis due to calcified coronary lesion: Secondary | ICD-10-CM | POA: Diagnosis not present

## 2023-09-15 DIAGNOSIS — I82401 Acute embolism and thrombosis of unspecified deep veins of right lower extremity: Secondary | ICD-10-CM | POA: Diagnosis not present

## 2023-09-15 DIAGNOSIS — I509 Heart failure, unspecified: Principal | ICD-10-CM

## 2023-09-15 DIAGNOSIS — I4892 Unspecified atrial flutter: Secondary | ICD-10-CM | POA: Diagnosis present

## 2023-09-15 DIAGNOSIS — I420 Dilated cardiomyopathy: Secondary | ICD-10-CM | POA: Diagnosis not present

## 2023-09-15 DIAGNOSIS — M069 Rheumatoid arthritis, unspecified: Secondary | ICD-10-CM | POA: Diagnosis present

## 2023-09-15 DIAGNOSIS — R197 Diarrhea, unspecified: Secondary | ICD-10-CM | POA: Diagnosis present

## 2023-09-15 DIAGNOSIS — Z515 Encounter for palliative care: Secondary | ICD-10-CM | POA: Diagnosis not present

## 2023-09-15 DIAGNOSIS — I5021 Acute systolic (congestive) heart failure: Secondary | ICD-10-CM | POA: Diagnosis present

## 2023-09-15 DIAGNOSIS — E854 Organ-limited amyloidosis: Secondary | ICD-10-CM | POA: Diagnosis present

## 2023-09-15 DIAGNOSIS — I083 Combined rheumatic disorders of mitral, aortic and tricuspid valves: Secondary | ICD-10-CM | POA: Diagnosis present

## 2023-09-15 DIAGNOSIS — I43 Cardiomyopathy in diseases classified elsewhere: Secondary | ICD-10-CM | POA: Diagnosis present

## 2023-09-15 DIAGNOSIS — Z8249 Family history of ischemic heart disease and other diseases of the circulatory system: Secondary | ICD-10-CM

## 2023-09-15 DIAGNOSIS — I2489 Other forms of acute ischemic heart disease: Secondary | ICD-10-CM | POA: Diagnosis present

## 2023-09-15 DIAGNOSIS — H547 Unspecified visual loss: Secondary | ICD-10-CM | POA: Diagnosis present

## 2023-09-15 DIAGNOSIS — I472 Ventricular tachycardia, unspecified: Secondary | ICD-10-CM | POA: Diagnosis present

## 2023-09-15 DIAGNOSIS — I493 Ventricular premature depolarization: Secondary | ICD-10-CM | POA: Diagnosis present

## 2023-09-15 DIAGNOSIS — R7989 Other specified abnormal findings of blood chemistry: Secondary | ICD-10-CM

## 2023-09-15 DIAGNOSIS — J9811 Atelectasis: Secondary | ICD-10-CM | POA: Diagnosis present

## 2023-09-15 DIAGNOSIS — I700 Atherosclerosis of aorta: Secondary | ICD-10-CM | POA: Diagnosis present

## 2023-09-15 DIAGNOSIS — I4430 Unspecified atrioventricular block: Secondary | ICD-10-CM | POA: Diagnosis present

## 2023-09-15 DIAGNOSIS — I361 Nonrheumatic tricuspid (valve) insufficiency: Secondary | ICD-10-CM | POA: Diagnosis not present

## 2023-09-15 DIAGNOSIS — Z79899 Other long term (current) drug therapy: Secondary | ICD-10-CM | POA: Diagnosis not present

## 2023-09-15 DIAGNOSIS — N400 Enlarged prostate without lower urinary tract symptoms: Secondary | ICD-10-CM | POA: Diagnosis present

## 2023-09-15 DIAGNOSIS — I5033 Acute on chronic diastolic (congestive) heart failure: Secondary | ICD-10-CM | POA: Diagnosis not present

## 2023-09-15 DIAGNOSIS — I110 Hypertensive heart disease with heart failure: Principal | ICD-10-CM | POA: Diagnosis present

## 2023-09-15 DIAGNOSIS — C61 Malignant neoplasm of prostate: Secondary | ICD-10-CM | POA: Diagnosis not present

## 2023-09-15 DIAGNOSIS — Z87891 Personal history of nicotine dependence: Secondary | ICD-10-CM

## 2023-09-15 DIAGNOSIS — I2510 Atherosclerotic heart disease of native coronary artery without angina pectoris: Secondary | ICD-10-CM | POA: Insufficient documentation

## 2023-09-15 DIAGNOSIS — Z8546 Personal history of malignant neoplasm of prostate: Secondary | ICD-10-CM

## 2023-09-15 DIAGNOSIS — Z7901 Long term (current) use of anticoagulants: Secondary | ICD-10-CM | POA: Diagnosis not present

## 2023-09-15 DIAGNOSIS — K219 Gastro-esophageal reflux disease without esophagitis: Secondary | ICD-10-CM | POA: Diagnosis present

## 2023-09-15 DIAGNOSIS — I4891 Unspecified atrial fibrillation: Secondary | ICD-10-CM | POA: Insufficient documentation

## 2023-09-15 DIAGNOSIS — I4819 Other persistent atrial fibrillation: Secondary | ICD-10-CM

## 2023-09-15 DIAGNOSIS — Z8042 Family history of malignant neoplasm of prostate: Secondary | ICD-10-CM

## 2023-09-15 DIAGNOSIS — D472 Monoclonal gammopathy: Secondary | ICD-10-CM | POA: Diagnosis present

## 2023-09-15 DIAGNOSIS — R1110 Vomiting, unspecified: Secondary | ICD-10-CM | POA: Diagnosis present

## 2023-09-15 DIAGNOSIS — I350 Nonrheumatic aortic (valve) stenosis: Secondary | ICD-10-CM | POA: Diagnosis not present

## 2023-09-15 DIAGNOSIS — I82411 Acute embolism and thrombosis of right femoral vein: Secondary | ICD-10-CM | POA: Diagnosis present

## 2023-09-15 DIAGNOSIS — J449 Chronic obstructive pulmonary disease, unspecified: Secondary | ICD-10-CM | POA: Diagnosis present

## 2023-09-15 DIAGNOSIS — E785 Hyperlipidemia, unspecified: Secondary | ICD-10-CM | POA: Diagnosis present

## 2023-09-15 DIAGNOSIS — Z880 Allergy status to penicillin: Secondary | ICD-10-CM

## 2023-09-15 DIAGNOSIS — I483 Typical atrial flutter: Secondary | ICD-10-CM | POA: Diagnosis not present

## 2023-09-15 DIAGNOSIS — M159 Polyosteoarthritis, unspecified: Secondary | ICD-10-CM | POA: Diagnosis present

## 2023-09-15 DIAGNOSIS — Z791 Long term (current) use of non-steroidal anti-inflammatories (NSAID): Secondary | ICD-10-CM

## 2023-09-15 DIAGNOSIS — I272 Pulmonary hypertension, unspecified: Secondary | ICD-10-CM | POA: Diagnosis present

## 2023-09-15 DIAGNOSIS — I340 Nonrheumatic mitral (valve) insufficiency: Secondary | ICD-10-CM | POA: Diagnosis not present

## 2023-09-15 DIAGNOSIS — E876 Hypokalemia: Secondary | ICD-10-CM | POA: Diagnosis present

## 2023-09-15 DIAGNOSIS — K648 Other hemorrhoids: Secondary | ICD-10-CM | POA: Diagnosis present

## 2023-09-15 DIAGNOSIS — I5031 Acute diastolic (congestive) heart failure: Secondary | ICD-10-CM | POA: Diagnosis not present

## 2023-09-15 LAB — CBC
HCT: 40.1 % (ref 39.0–52.0)
Hemoglobin: 12.5 g/dL — ABNORMAL LOW (ref 13.0–17.0)
MCH: 28.9 pg (ref 26.0–34.0)
MCHC: 31.2 g/dL (ref 30.0–36.0)
MCV: 92.8 fL (ref 80.0–100.0)
Platelets: 207 K/uL (ref 150–400)
RBC: 4.32 MIL/uL (ref 4.22–5.81)
RDW: 17.2 % — ABNORMAL HIGH (ref 11.5–15.5)
WBC: 7.9 K/uL (ref 4.0–10.5)
nRBC: 0 % (ref 0.0–0.2)

## 2023-09-15 LAB — CBG MONITORING, ED: Glucose-Capillary: 123 mg/dL — ABNORMAL HIGH (ref 70–99)

## 2023-09-15 LAB — BASIC METABOLIC PANEL WITH GFR
Anion gap: 14 (ref 5–15)
BUN: 12 mg/dL (ref 8–23)
CO2: 19 mmol/L — ABNORMAL LOW (ref 22–32)
Calcium: 9.1 mg/dL (ref 8.9–10.3)
Chloride: 107 mmol/L (ref 98–111)
Creatinine, Ser: 0.71 mg/dL (ref 0.61–1.24)
GFR, Estimated: >60 mL/min
Glucose, Bld: 104 mg/dL — ABNORMAL HIGH (ref 70–99)
Potassium: 4.2 mmol/L (ref 3.5–5.1)
Sodium: 140 mmol/L (ref 135–145)

## 2023-09-15 LAB — TSH: TSH: 1.804 u[IU]/mL (ref 0.350–4.500)

## 2023-09-15 LAB — BRAIN NATRIURETIC PEPTIDE: B Natriuretic Peptide: 707.9 pg/mL — ABNORMAL HIGH (ref 0.0–100.0)

## 2023-09-15 LAB — TROPONIN I (HIGH SENSITIVITY)
Troponin I (High Sensitivity): 44 ng/L — ABNORMAL HIGH (ref <18)
Troponin I (High Sensitivity): 50 ng/L — ABNORMAL HIGH (ref <18)

## 2023-09-15 LAB — PHOSPHORUS: Phosphorus: 3.6 mg/dL (ref 2.5–4.6)

## 2023-09-15 LAB — MAGNESIUM: Magnesium: 2.1 mg/dL (ref 1.7–2.4)

## 2023-09-15 MED ORDER — DIGOXIN 0.25 MG/ML IJ SOLN
0.1250 mg | Freq: Four times a day (QID) | INTRAMUSCULAR | Status: DC
  Filled 2023-09-15 (×2): qty 2

## 2023-09-15 MED ORDER — ACETAMINOPHEN 325 MG PO TABS
650.0000 mg | ORAL_TABLET | ORAL | Status: DC | PRN
  Filled 2023-09-15: qty 2

## 2023-09-15 MED ORDER — HYDROCORTISONE ACETATE 25 MG RE SUPP
25.0000 mg | Freq: Two times a day (BID) | RECTAL | Status: DC
  Administered 2023-09-15: 25 mg via RECTAL
  Filled 2023-09-15 (×3): qty 1

## 2023-09-15 MED ORDER — LISINOPRIL 20 MG PO TABS
20.0000 mg | ORAL_TABLET | Freq: Every day | ORAL | Status: DC
  Administered 2023-09-15 – 2023-09-16 (×2): 20 mg via ORAL
  Filled 2023-09-15 (×2): qty 2

## 2023-09-15 MED ORDER — ONDANSETRON HCL 4 MG/2ML IJ SOLN: 4.0000 mg | Freq: Four times a day (QID) | INTRAMUSCULAR | Status: DC | PRN

## 2023-09-15 MED ORDER — FUROSEMIDE 10 MG/ML IJ SOLN
40.0000 mg | Freq: Two times a day (BID) | INTRAMUSCULAR | Status: DC
  Administered 2023-09-15 – 2023-09-17 (×5): 40 mg via INTRAVENOUS
  Filled 2023-09-15 (×5): qty 4

## 2023-09-15 MED ORDER — METOPROLOL TARTRATE 5 MG/5ML IV SOLN: 2.5000 mg | Freq: Four times a day (QID) | INTRAVENOUS | Status: DC | PRN

## 2023-09-15 MED ORDER — SODIUM CHLORIDE 0.9 % IV SOLN: 250.0000 mL | INTRAVENOUS | Status: AC | PRN

## 2023-09-15 MED ORDER — ENOXAPARIN SODIUM 40 MG/0.4ML IJ SOSY
40.0000 mg | PREFILLED_SYRINGE | INTRAMUSCULAR | Status: DC
  Administered 2023-09-15: 40 mg via SUBCUTANEOUS
  Filled 2023-09-15: qty 0.4

## 2023-09-15 MED ORDER — HYDROCODONE-ACETAMINOPHEN 5-325 MG PO TABS: 1.0000 | ORAL_TABLET | Freq: Three times a day (TID) | ORAL | Status: DC | PRN

## 2023-09-15 MED ORDER — METOPROLOL TARTRATE 25 MG PO TABS
25.0000 mg | ORAL_TABLET | Freq: Two times a day (BID) | ORAL | Status: DC
  Administered 2023-09-15 – 2023-09-17 (×5): 25 mg via ORAL
  Filled 2023-09-15 (×5): qty 1

## 2023-09-15 MED ORDER — ATORVASTATIN CALCIUM 80 MG PO TABS
80.0000 mg | ORAL_TABLET | Freq: Every day | ORAL | Status: DC
  Administered 2023-09-15 – 2023-09-22 (×8): 80 mg via ORAL
  Filled 2023-09-15 (×2): qty 1
  Filled 2023-09-15: qty 4
  Filled 2023-09-15: qty 1
  Filled 2023-09-15: qty 4
  Filled 2023-09-15 (×3): qty 1

## 2023-09-15 MED ORDER — IOHEXOL 350 MG/ML SOLN
75.0000 mL | Freq: Once | INTRAVENOUS | Status: AC | PRN
  Administered 2023-09-15: 75 mL via INTRAVENOUS

## 2023-09-15 MED ORDER — SODIUM CHLORIDE 0.9% FLUSH
3.0000 mL | Freq: Two times a day (BID) | INTRAVENOUS | Status: DC
  Administered 2023-09-15 – 2023-09-22 (×13): 3 mL via INTRAVENOUS

## 2023-09-15 MED ORDER — ASPIRIN 81 MG PO TBEC
81.0000 mg | DELAYED_RELEASE_TABLET | Freq: Every day | ORAL | Status: DC
Start: 2023-09-15 — End: 2023-09-22
  Administered 2023-09-15 – 2023-09-22 (×8): 81 mg via ORAL
  Filled 2023-09-15 (×8): qty 1

## 2023-09-15 MED ORDER — SODIUM CHLORIDE 0.9% FLUSH: 3.0000 mL | INTRAVENOUS | Status: DC | PRN

## 2023-09-15 NOTE — H&P (Addendum)
 History and Physical    Damain Broadus FMW:[id number] DOB: May 28, 1941 DOA: 09/15/2023  PCP: Edman Marsa PARAS, DO (Confirm with patient/family/NH records and if not entered, this has to be entered at [facility] point of entry) Patient coming from: Home  I have personally briefly reviewed patient's old medical records in [facility]  Chief Complaint: SOB, leg pain  HPI: Keith Bates is a 83 y.o. male with medical history significant of internal hemorrhoids, prostate cancer on hormone manipulation, presented with worsening of exertional dyspnea and leg swelling.  Symptoms started last summer patient started to have intermittent leg swelling which he attributed to  hot weather'.  This time, starting about 3 weeks ago, patient started to have exertional dyspnea, he used to able to climb 2 flights of stairs without any difficulty but increasing shortness of breath with daily activity feeling tired easily.  This week he started having orthopnea, denies any cough no chest pains no palpitations or lightheadedness.  Wife checked his blood pressure earlier this week as BP 150-160s.  Mother died at age 18 from  thrombosis problem  ED Course: Bradycardia heart rate in the 40s-60s, blood pressure elevated blood work showed creatinine 2.7 hemoglobin 12.5 K4.2.  CTA chest showed no PE but bilateral pleural effusion right more than left, and cardiomegaly.  Review of Systems: As per HPI otherwise 14 point review of systems negative.    Past Medical History:  Diagnosis Date   Arthritis    DDD (degenerative disc disease)    Enlarged prostate    Environmental allergies    GERD (gastroesophageal reflux disease)    Hx of ulcer disease    Loss of weight    Nocturia    Prostate cancer (HCC)    Unspecified hemorrhoids without mention of complication     Past Surgical History:  Procedure Laterality Date   COLONOSCOPY  03/13/2013   HEMORRHOID BANDING     LYMPHADENECTOMY Bilateral 06/21/2013    Procedure: LYMPHADENECTOMY   BILATERAL PELVIC LYMPH NODE DISSECTION ;  Surgeon: Ricardo Likens, MD;  Location: [facility];  Service: Urology;  Laterality: Bilateral;   ROBOT ASSISTED LAPAROSCOPIC RADICAL PROSTATECTOMY N/A 06/21/2013   Procedure: ROBOTIC ASSISTED LAPAROSCOPIC RADICAL PROSTATECTOMY;  Surgeon: Ricardo Likens, MD;  Location: [facility];  Service: Urology;  Laterality: N/A;     reports that he quit smoking about 51 years ago. His smoking use included cigarettes. He started smoking about 61 years ago. He has a 15 pack-year smoking history. He has quit using smokeless tobacco. He reports that he does not drink alcohol and does not use drugs.  Allergies  Allergen Reactions   Other Rash    Styrofoam - rash and almost went blind   Plasticized Base [Plastibase] Rash   Penicillins Rash    Family History  Problem Relation Age of Onset   Prostate cancer Father        with mets to nodes   Cancer Brother        colon and prostate   Heart attack Mother      Prior to Admission medications   Medication Sig Start Date End Date Taking? Authorizing Provider  acetaminophen  (TYLENOL ) 500 MG tablet Take 1 tablet (500 mg total) by mouth every 6 (six) hours as needed. 04/20/17   Karamalegos, Marsa PARAS, DO  flunisolide  (NASALIDE ) 25 MCG/ACT (0.025%) SOLN Place 2 sprays into the nose 2 (two) times daily. 09/16/22   Karamalegos, Marsa PARAS, DO  HYDROcodone -acetaminophen  (NORCO/VICODIN) 5-325 MG tablet Take 1 tablet by mouth  every 8 (eight) hours as needed for moderate pain. 04/21/22   Antonette Angeline ORN, NP  hydrocortisone  (ANUSOL -HC) 25 MG suppository Place 1 suppository (25 mg total) rectally 2 (two) times daily as needed for hemorrhoids or anal itching. For 7 days 12/13/19   Edman Marsa PARAS, DO  meloxicam  (MOBIC ) 7.5 MG tablet TAKE 1 TABLET (7.5 MG TOTAL) BY MOUTH DAILY AS NEEDED FOR PAIN. UP TO 1-2 WEEKS AS NEEDED 12/13/22   Edman Marsa PARAS, DO  Multiple Vitamin (MULTIVITAMIN) tablet Take  1 tablet by mouth daily.    [provider]  Red Yeast Rice Extract (RED YEAST RICE PO) Take by mouth. Patient not taking: Reported on 12/30/2022    [provider]    Physical Exam: Vitals:   09/15/23 1042 09/15/23 1044 09/15/23 1255 09/15/23 1459  BP:  (!) 167/87  (!) 158/87  Pulse: (!) 43   73  Resp: 20   20  Temp: 98 F (36.7 C)   98.1 F (36.7 C)  TempSrc: Oral   Oral  SpO2: 98%   100%  Weight:   64.4 kg   Height:   5' 11 (1.803 m)     Constitutional: NAD, calm, comfortable Vitals:   09/15/23 1042 09/15/23 1044 09/15/23 1255 09/15/23 1459  BP:  (!) 167/87  (!) 158/87  Pulse: (!) 43   73  Resp: 20   20  Temp: 98 F (36.7 C)   98.1 F (36.7 C)  TempSrc: Oral   Oral  SpO2: 98%   100%  Weight:   64.4 kg   Height:   5' 11 (1.803 m)    Eyes: PERRL, lids and conjunctivae normal ENMT: Mucous membranes are moist. Posterior pharynx clear of any exudate or lesions.Normal dentition.  Neck: normal, supple, no masses, no thyromegaly Respiratory: Diminished breathing sound bilateral lower lung fields, no wheezing, fine crackles in bilateral lung fields. Normal respiratory effort. No accessory muscle use.  Cardiovascular: Regular rate and rhythm, no murmurs / rubs / gallops. 2+ extremity edema. 2+ pedal pulses. No carotid bruits.  Abdomen: no tenderness, no masses palpated. No hepatosplenomegaly. Bowel sounds positive.  Musculoskeletal: no clubbing / cyanosis. No joint deformity upper and lower extremities. Good ROM, no contractures. Normal muscle tone.  Skin: no rashes, lesions, ulcers. No induration Neurologic: CN 2-12 grossly intact. Sensation intact, DTR normal. Strength 5/5 in all 4.  Psychiatric: Normal judgment and insight. Alert and oriented x 3. Normal mood.    Labs on Admission: I have personally reviewed following labs and imaging studies  CBC: Recent Labs  Lab 09/15/23 1044  WBC 7.9  HGB 12.5*  HCT 40.1  MCV 92.8  PLT 207   Basic Metabolic  Panel: Recent Labs  Lab 09/15/23 1044  NA 140  K 4.2  CL 107  CO2 19*  GLUCOSE 104*  BUN 12  CREATININE 0.71  CALCIUM  9.1   GFR: Estimated Creatinine Clearance: 64.8 mL/min (by C-G formula based on SCr of 0.71 mg/dL). Liver Function Tests: No results for input(s): AST, ALT, ALKPHOS, BILITOT, PROT, ALBUMIN in the last 168 hours. No results for input(s): LIPASE, AMYLASE in the last 168 hours. No results for input(s): AMMONIA in the last 168 hours. Coagulation Profile: No results for input(s): INR, PROTIME in the last 168 hours. Cardiac Enzymes: No results for input(s): CKTOTAL, CKMB, CKMBINDEX, TROPONINI in the last 168 hours. BNP (last 3 results) No results for input(s): PROBNP in the last 8760 hours. HbA1C: No results for input(s): HGBA1C in  the last 72 hours. CBG: No results for input(s): GLUCAP in the last 168 hours. Lipid Profile: No results for input(s): CHOL, HDL, LDLCALC, TRIG, CHOLHDL, LDLDIRECT in the last 72 hours. Thyroid Function Tests: No results for input(s): TSH, T4TOTAL, FREET4, T3FREE, THYROIDAB in the last 72 hours. Anemia Panel: No results for input(s): VITAMINB12, FOLATE, FERRITIN, TIBC, IRON, RETICCTPCT in the last 72 hours. Urine analysis:    Component Value Date/Time   COLORURINE YELLOW 12/13/2019 1425   APPEARANCEUR CLEAR 12/13/2019 1425   LABSPEC 1.024 12/13/2019 1425   PHURINE < OR = 5.0 12/13/2019 1425   GLUCOSEU NEGATIVE 12/13/2019 1425   HGBUR TRACE (A) 12/13/2019 1425   BILIRUBINUR Negative 01/10/2022 0826   KETONESUR NEGATIVE 12/13/2019 1425   PROTEINUR Positive (A) 01/10/2022 0826   PROTEINUR NEGATIVE 12/13/2019 1425   UROBILINOGEN 0.2 01/10/2022 0826   UROBILINOGEN 0.2 07/27/2013 0039   NITRITE Negative 01/10/2022 0826   NITRITE NEGATIVE 12/13/2019 1425   LEUKOCYTESUR Small (1+) (A) 01/10/2022 0826   LEUKOCYTESUR NEGATIVE 12/13/2019 1425    Radiological Exams  on Admission: CT Angio Chest PE W and/or Wo Contrast Result Date: 09/15/2023 CLINICAL DATA:  Pulmonary embolism (PE) suspected, high prob, short of breath for several months, abnormal chest x-ray EXAM: CT ANGIOGRAPHY CHEST WITH CONTRAST TECHNIQUE: Multidetector CT imaging of the chest was performed using the standard protocol during bolus administration of intravenous contrast. Multiplanar CT image reconstructions and MIPs were obtained to evaluate the vascular anatomy. RADIATION DOSE REDUCTION: This exam was performed according to the departmental dose-optimization program which includes automated exposure control, adjustment of the mA and/or kV according to patient size and/or use of iterative reconstruction technique. CONTRAST:  75mL OMNIPAQUE  IOHEXOL  350 MG/ML SOLN COMPARISON:  09/15/2023, 10/02/2006 FINDINGS: Cardiovascular: This is a technically adequate evaluation of the pulmonary vasculature. There is relative decreased enhancement of the posterior branches of the bilateral lower lobe pulmonary arteries, felt to be due to timing of contrast bolus and adjacent atelectasis rather than true pulmonary emboli. There are no discrete filling defects or pulmonary emboli identified within the remaining pulmonary vasculature. The heart is enlarged without pericardial effusion. There is significant reflux of contrast into the hepatic veins, suggesting cardiac dysfunction. Prominent biatrial dilatation. Normal caliber of the thoracic aorta. Atherosclerosis of the aorta and coronary vasculature. Mediastinum/Nodes: Mediastinal lymphadenopathy identified, measuring up to 1.3 cm in the subcarinal station. Segmental air esophagram within the upper thoracic esophagus, of uncertain significance. Thyroid and trachea are unremarkable. Lungs/Pleura: There are small bilateral pleural effusions, right greater than left. There is a rounded area of loculated fluid at the confluence of the major and minor fissures within the right  midlung, corresponding to the rounded density seen on chest x-ray. There is bilateral lower lobe atelectasis, right greater than left. No pneumothorax. Central airways are patent. Upper Abdomen: No acute abnormality. Musculoskeletal: There are no acute displaced fractures. 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 Reconstructed images demonstrate no additional findings. Review of the MIP images confirms the above findings. IMPRESSION: 1. No evidence of pulmonary embolus. Decreased enhancement of the posterior branches of the bilateral lower lobe pulmonary arteries is felt to be due to adjacent atelectasis and flow related phenomenon. 2. Cardiomegaly, with prominent biatrial dilatation. Reflux of contrast into the hepatic veins suggest underlying cardiac dysfunction. Echocardiography may be useful. 3. Bilateral pleural effusions, right greater than left. Loculated fluid at the confluence of the right major and minor  fissures corresponds to the rounded density seen on chest x-ray. 4. Dependent areas of compressive atelectasis within the bilateral lower lobes, right greater than left. 5. Aortic Atherosclerosis (ICD10-I70.0). Coronary artery atherosclerosis. Electronically Signed   By: Ozell Daring M.D.   On: 09/15/2023 15:12   DG Chest 2 View Result Date: 09/15/2023 CLINICAL DATA:  Shortness of breath for several months. EXAM: CHEST - 2 VIEW COMPARISON:  July 28, 2013. FINDINGS: Mild cardiomegaly is noted. Minimal bibasilar subsegmental atelectasis is noted with small pleural effusions. Rounded density is noted laterally in right midlung concerning for possible pulmonary nodule. Severe degenerative changes are seen involving both glenohumeral joints. IMPRESSION: Rounded density noted laterally in right midlung concerning for possible pulmonary nodule or mass. CT scan of the chest with intravenous  contrast is recommended. Minimal bibasilar subsegmental atelectasis is noted with small pleural effusions. Electronically Signed   By: Lynwood Landy Raddle M.D.   On: 09/15/2023 11:43    EKG: Independently reviewed.  A flutter with 3-1 transduction no acute ST changes with PVCs.  Assessment/Plan Principal Problem:   CHF (congestive heart failure) (HCC) Active Problems:   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)   Flutter-fibrillation (HCC)  (please populate well all problems here in Problem List. (For example, if patient is on BP meds at home and you resume or decide to hold them, it is a problem that needs to be her. Same for CAD, COPD, HLD and so on)  Acute CHF decompensation -Probably diastolic, likely from prolonged undiagnosed HTN and/or a flutter -Continue IV diuresis Lasix  40 mg twice daily -Daily I and Os -Echo -[facility] cardiology consulted -Check TSH and UA, DVT study  A flutter, rate controlled -CHADS2=3, indication for systemic anticoagulation however patient reported he has poorly controlled internal hemorrhoid bleeding with daily bleeding on the pressure and burning times up to push back hemorrhoid every day.  Will start him on hydrocortisone  suppository and recommend him to see GI/general surgeon for hemorrhoid treatment. -Given the bleeding risk or way stroke, will hold off starting systemic anticoagulation.  Will start patient on aspirin  for now.  Frequent PVCs -Telemonitoring x 24 hours, check Mg and Phos -Will discuss with cardiology for indication for rhythm control medications.  HTN -Start lisinopril  while patient on Lasix   Prostate cancer -Stable, get hormonal manipulation injection every 6 months  DVT prophylaxis: Lovenox  Code Status: Full code Family Communication: Wife at bedside Disposition Plan: Patient nursing with no other observed compensation requiring IV diuresis and inpatient cardiology workup, expect more than 2 midnight hospital  stay Consults called: Sent message to Dr. Darron Admission status: Tele admit   Cort ONEIDA Mana MD [facility] Pager [phone]  09/15/2023, 3:33 PM

## 2023-09-15 NOTE — ED Triage Notes (Signed)
 First nurse note: pt to ED from [facility] for shob for a few months and BLE swelling

## 2023-09-15 NOTE — Progress Notes (Signed)
       CROSS COVER NOTE  NAME: Keith Bates MRN: [id number] DOB : [birth year] ATTENDING PHYSICIAN: Laurita Cort DASEN, MD    Date of Service   09/15/2023   HPI/Events of Note   Paged by RN due to palpable pulse  of 30 and symptoms of lightheadedness. HPI, cards consult, labs reviewed Metoprolol  given 1655 with vitals documented BP 154/98 (114) HR 47 resp 26  Furosemide  1806  Anusol  suppository PR at 1554  Interventions   Assessment/Plan:    09/15/2023    4:21 PM 09/15/2023    2:59 PM 09/15/2023   12:55 PM  Vitals with BMI  Height   5' 11  Weight   142 lbs  BMI   19.81  Systolic 154 158   Diastolic 98 87   Pulse 47 73    BEDSIDE Patient currently denying symptoms. States he feels pretty good better  Daughter at bedside. Patient states he had emesis and diarrhea episode just after receiving the anusol  suppository Cbg 123 EKG reviewed by me - atrial flutter with variable AV bock - no acute ST T  wave ischemic changes rate 63 D/c suppostiory - use preparation H for hemorrhoidal irritation Continue to monitor on tele        Keith LITTIE [facility] NP [facility] Cross Cover 7pm-7am - check amion for availability Pager [phone]

## 2023-09-15 NOTE — Consult Note (Signed)
 Cardiology Consultation:   Patient ID: Machi Whittaker; [id number]; 03-28-1941   Admit date: 09/15/2023 Date of Consult: 09/15/2023  Primary Care Provider: Edman Marsa PARAS, DO Primary Cardiologist: New - consult by Dr. Darron, MD Primary Electrophysiologist:  None   Patient Profile:   Kaisyn Millea is a 83 y.o. male with a hx of CAD noted on CT imaging, prediabetes, hyperlipidemia, prostate cancer, rheumatoid arthritis, and osteoarthritis who is being seen today for the evaluation of CHF at the request of Dr. Laurita.  History of Present Illness:   Mr. Mendonsa Has no previously known cardiac history.  He reports an approximate 6 history of bilateral lower extremity swelling and associated orthopnea.  Over the past 2 to 3 weeks he has noted progressive exertional shortness of breath and early satiety.  At times, he has also noted tachypalpitations over the past 6 months that are more frequent as of late.  He has been without symptoms of frank chest pain.  Due to ongoing symptoms, he presented to outside office today for evaluation.  At that time, he reported a weight of 142 pounds in 12/2022 with a weight of 160 pounds at outside office.  He was transferred to [facility] where he was noted to be hypertensive with a blood pressure of 167/87.  He was found to be in atrial flutter with RVR with rates ranging from the low 100s to 140s bpm, no evidence of bradycardic rates.   Afebrile.  Oxygen saturations 98 to 100% on room air.  Labs notable for an initial high-sensitivity troponin 44.  Delta troponin pending.  BNP 707.  Chest x-ray with a rounded density noted laterally in the right midlung concerning for possible pulmonary nodule versus mass.  Minimal bibasilar subsegmental atelectasis with small pleural effusions noted.  CTA chest negative for PE.  Cardiomegaly with reflux of contrast into the hepatic veins noted and suggestive of cardiac dysfunction.  Bilateral pleural effusions with the right being greater  than the left.  Loculated fluid at the confluence of the right major and minor fissures corresponded to previously noted rounded density on chest x-ray.  Dependent areas of compressive atelectasis within the bilateral lower lobes also noted.  Coronary artery calcification and aortic atherosclerosis noted.  In the ER, he received aspirin  81 mg and lisinopril .  Upon admission he was started on furosemide  40 mg twice daily.  Diuresis has not been initiated.  Cardiology asked to evaluate volume overload.  At time of cardiology consult he continues to note ongoing dyspnea and at times is short of breath with prolonged conversation.  Grandson and wife are present at bedside.  With regards to his GI bleeding and hemorrhoids, he reports daily bleeding associated with a bowel movement.    Past Medical History:  Diagnosis Date   Arthritis    DDD (degenerative disc disease)    Enlarged prostate    Environmental allergies    GERD (gastroesophageal reflux disease)    Hx of ulcer disease    Loss of weight    Nocturia    Prostate cancer (HCC)    Unspecified hemorrhoids without mention of complication     Past Surgical History:  Procedure Laterality Date   COLONOSCOPY  03/13/2013   HEMORRHOID BANDING     LYMPHADENECTOMY Bilateral 06/21/2013   Procedure: LYMPHADENECTOMY   BILATERAL PELVIC LYMPH NODE DISSECTION ;  Surgeon: Ricardo Likens, MD;  Location: [facility];  Service: Urology;  Laterality: Bilateral;   ROBOT ASSISTED LAPAROSCOPIC RADICAL PROSTATECTOMY N/A 06/21/2013   Procedure:  ROBOTIC ASSISTED LAPAROSCOPIC RADICAL PROSTATECTOMY;  Surgeon: Ricardo Likens, MD;  Location: [facility];  Service: Urology;  Laterality: N/A;     Home Meds: Prior to Admission medications   Medication Sig Start Date End Date Taking? Authorizing Provider  acetaminophen  (TYLENOL ) 500 MG tablet Take 1 tablet (500 mg total) by mouth every 6 (six) hours as needed. 04/20/17   Karamalegos, Marsa PARAS, DO  flunisolide  (NASALIDE ) 25  MCG/ACT (0.025%) SOLN Place 2 sprays into the nose 2 (two) times daily. 09/16/22   Karamalegos, Marsa PARAS, DO  HYDROcodone -acetaminophen  (NORCO/VICODIN) 5-325 MG tablet Take 1 tablet by mouth every 8 (eight) hours as needed for moderate pain. 04/21/22   Antonette Angeline ORN, NP  hydrocortisone  (ANUSOL -HC) 25 MG suppository Place 1 suppository (25 mg total) rectally 2 (two) times daily as needed for hemorrhoids or anal itching. For 7 days 12/13/19   Edman Marsa PARAS, DO  meloxicam  (MOBIC ) 7.5 MG tablet TAKE 1 TABLET (7.5 MG TOTAL) BY MOUTH DAILY AS NEEDED FOR PAIN. UP TO 1-2 WEEKS AS NEEDED 12/13/22   Edman Marsa PARAS, DO  Multiple Vitamin (MULTIVITAMIN) tablet Take 1 tablet by mouth daily.    [provider]  Red Yeast Rice Extract (RED YEAST RICE PO) Take by mouth. Patient not taking: Reported on 12/30/2022    [provider]    Inpatient Medications: Scheduled Meds:  aspirin  EC  81 mg Oral Daily   enoxaparin  (LOVENOX ) injection  40 mg Subcutaneous Q24H   furosemide   40 mg Intravenous BID   hydrocortisone   25 mg Rectal BID   lisinopril   20 mg Oral Daily   sodium chloride  flush  3 mL Intravenous Q12H   Continuous Infusions:  sodium chloride      PRN Meds: sodium chloride , acetaminophen , HYDROcodone -acetaminophen , ondansetron  (ZOFRAN ) IV, sodium chloride  flush  Allergies:   Allergies  Allergen Reactions   Other Rash    Styrofoam - rash and almost went blind   Plasticized Base [Plastibase] Rash   Penicillins Rash    Social History:   Social History   Socioeconomic History   Marital status: Married    Spouse name: Not on file   Number of children: Not on file   Years of education: Not on file   Highest education level: Not on file  Occupational History   Occupation: retired  Tobacco Use   Smoking status: Former    Current packs/day: 0.00    Average packs/day: 1.5 packs/day for 10.0 years (15.0 ttl pk-yrs)    Types: Cigarettes    Start date:  06/06/1962    Quit date: 06/06/1972    Years since quitting: 51.3   Smokeless tobacco: Former  Building Services Engineer status: Former  Substance and Sexual Activity   Alcohol use: No   Drug use: No   Sexual activity: Not Currently  Other Topics Concern   Not on file  Social History Narrative   Not on file   Social Drivers of Health   Financial Resource Strain: Low Risk  (12/30/2022)   Overall Financial Resource Strain (CARDIA)    Difficulty of Paying Living Expenses: Not hard at all  Food Insecurity: No Food Insecurity (12/30/2022)   Hunger Vital Sign    Worried About Running Out of Food in the Last Year: Never true    Ran Out of Food in the Last Year: Never true  Transportation Needs: No Transportation Needs (12/30/2022)   PRAPARE - Transportation    Lack of Transportation (Medical): No    Lack  of Transportation (Non-Medical): No  Physical Activity: Insufficiently Active (12/30/2022)   Exercise Vital Sign    Days of Exercise per Week: 7 days    Minutes of Exercise per Session: 20 min  Stress: No Stress Concern Present (12/30/2022)   [employer] of Occupational Health - Occupational Stress Questionnaire    Feeling of Stress : Not at all  Social Connections: Socially Integrated (12/30/2022)   Social Connection and Isolation Panel [NHANES]    Frequency of Communication with Friends and Family: More than three times a week    Frequency of Social Gatherings with Friends and Family: Once a week    Attends Religious Services: More than 4 times per year    Active Member of [employer] or Organizations: Yes    Attends Banker Meetings: Never    Marital Status: Married  Catering Manager Violence: Not At Risk (12/30/2022)   Humiliation, Afraid, Rape, and Kick questionnaire    Fear of Current or Ex-Partner: No    Emotionally Abused: No    Physically Abused: No    Sexually Abused: No     Family History:   Family History  Problem Relation Age of Onset   Prostate cancer  Father        with mets to nodes   Cancer Brother        colon and prostate   Heart attack Mother     ROS:  Review of Systems  Constitutional:  Positive for malaise/fatigue. Negative for chills, diaphoresis, fever and weight loss.  HENT:  Negative for congestion.   Eyes:  Negative for discharge and redness.  Respiratory:  Positive for shortness of breath. Negative for cough, sputum production and wheezing.   Cardiovascular:  Positive for palpitations, orthopnea and leg swelling. Negative for chest pain, claudication and PND.  Gastrointestinal:  Positive for blood in stool and constipation. Negative for abdominal pain, diarrhea, heartburn, melena, nausea and vomiting.  Musculoskeletal:  Negative for falls and myalgias.  Skin:  Negative for rash.  Neurological:  Positive for weakness. Negative for dizziness, tingling, tremors, sensory change, speech change, focal weakness and loss of consciousness.  Endo/Heme/Allergies:  Does not bruise/bleed easily.  Psychiatric/Behavioral:  Negative for substance abuse. The patient is not nervous/anxious.   All other systems reviewed and are negative.     Physical Exam/Data:   Vitals:   09/15/23 1042 09/15/23 1044 09/15/23 1255 09/15/23 1459  BP:  (!) 167/87  (!) 158/87  Pulse: (!) 43   73  Resp: 20   20  Temp: 98 F (36.7 C)   98.1 F (36.7 C)  TempSrc: Oral   Oral  SpO2: 98%   100%  Weight:   64.4 kg   Height:   5' 11 (1.803 m)    No intake or output data in the 24 hours ending 09/15/23 1558 Filed Weights   09/15/23 1255  Weight: 64.4 kg   Body mass index is 19.8 kg/m.   Physical Exam: General: Well developed, well nourished, in no acute distress. Head: Normocephalic, atraumatic, sclera non-icteric, no xanthomas, nares without discharge.  Neck: Negative for carotid bruits. JVD elevated approximately 8 cm. Lungs: Diminished breath sounds bilaterally along the bases with the right being greater than the left. Breathing is  unlabored. Heart: Irregularly irregular with S1 S2. No murmurs, rubs, or gallops appreciated. Abdomen: Soft, non-tender, non-distended with normoactive bowel sounds. No hepatomegaly. No rebound/guarding. No obvious abdominal masses. Msk:  Strength and tone appear normal for age. Extremities: No clubbing or  cyanosis.  2-3+ bilateral lower extremity pitting edema with the right being greater than the left. Distal pedal pulses are 2+ and equal bilaterally. Neuro: Alert and oriented X 3. No facial asymmetry. No focal deficit. Moves all extremities spontaneously. Psych:  Responds to questions appropriately with a normal affect.   EKG:  The EKG was personally reviewed and demonstrates: A flutter with variable AV block, 78 bpm, ventricular couplet versus aberrancy, possible inferolateral infarct, nonspecific ST-T changes Telemetry:  Telemetry was personally reviewed and demonstrates: Atrial flutter with ventricular rates in the 90s to 140s bpm  Weights: Filed Weights   09/15/23 1255  Weight: 64.4 kg    Relevant CV Studies:  None available for review  Laboratory Data:  Chemistry Recent Labs  Lab 09/15/23 1044  NA 140  K 4.2  CL 107  CO2 19*  GLUCOSE 104*  BUN 12  CREATININE 0.71  CALCIUM  9.1  GFRNONAA >60  ANIONGAP 14    No results for input(s): PROT, ALBUMIN, AST, ALT, ALKPHOS, BILITOT in the last 168 hours. Hematology Recent Labs  Lab 09/15/23 1044  WBC 7.9  RBC 4.32  HGB 12.5*  HCT 40.1  MCV 92.8  MCH 28.9  MCHC 31.2  RDW 17.2*  PLT 207   Cardiac EnzymesNo results for input(s): TROPONINI in the last 168 hours. No results for input(s): TROPIPOC in the last 168 hours.  BNP Recent Labs  Lab 09/15/23 1044  BNP 707.9*    DDimer No results for input(s): DDIMER in the last 168 hours.  Radiology/Studies:  CT Angio Chest PE W and/or Wo Contrast Result Date: 09/15/2023 IMPRESSION: 1. No evidence of pulmonary embolus. Decreased enhancement of the  posterior branches of the bilateral lower lobe pulmonary arteries is felt to be due to adjacent atelectasis and flow related phenomenon. 2. Cardiomegaly, with prominent biatrial dilatation. Reflux of contrast into the hepatic veins suggest underlying cardiac dysfunction. Echocardiography may be useful. 3. Bilateral pleural effusions, right greater than left. Loculated fluid at the confluence of the right major and minor fissures corresponds to the rounded density seen on chest x-ray. 4. Dependent areas of compressive atelectasis within the bilateral lower lobes, right greater than left. 5. Aortic Atherosclerosis (ICD10-I70.0). Coronary artery atherosclerosis. Electronically Signed   By: Ozell Daring M.D.   On: 09/15/2023 15:12   DG Chest 2 View Result Date: 09/15/2023 IMPRESSION: Rounded density noted laterally in right midlung concerning for possible pulmonary nodule or mass. CT scan of the chest with intravenous contrast is recommended. Minimal bibasilar subsegmental atelectasis is noted with small pleural effusions. Electronically Signed   By: Lynwood Landy Raddle M.D.   On: 09/15/2023 11:43    Assessment and Plan:   1. CHF:  -Uncertain etiology and chronicity at this time  -Cannot exclude ischemic etiology given documented coronary artery calcification, advanced age with male status, hyperlipidemia, and history of prediabetes  -Volume up  -Diuretic nave, started on furosemide  40 mg twice daily upon admission  -Reasonable to continue this dose of furosemide  for now with close monitoring of urine output and renal function with recommendation to achieve net -1 to 2 L/day with recommendation to adjust furosemide  dose accordingly  -Obtain echo  -Daily weights, strict I's and O's   2. CAD with elevated high-sensitivity troponin:  -No chest pain -Initial high-sensitivity troponin 44 with delta pending  -Obtain echo as above with further recommendations regarding further testing pending results  -Defer  heparin  drip at this time with reported frequent hemorrhoidal bleeding, and in the  context of no dynamic elevation  -ASA  -Obtain lipid panel and A1c for further risk stratification   3. Atrial flutter:  -Uncertain chronicity  -Echo pending  -Obtain TSH and magnesium   -Potassium at goal  -CHA2DS2-VASc at least 4 (CHF, age x 2, vascular disease)  -At this time, defer initiation of heparin  drip given history of significant daily hemorrhoidal bleeding  -Patient may benefit from GI evaluation to weigh in on anticoagulation use  -If found to not be a candidate for [facility], would benefit from EP evaluation as an outpatient for consideration of Watchman -At this time, unable to pursue rhythm control without anticoagulation, however unable to exclude atrial arrhythmia contributing to cardiomyopathy, therefore we may need to challenge him with anticoagulation pending echo results and following initiation of diuresis -For now, pursue rate control with initiation of metoprolol  25 mg twice daily -May need to hold lisinopril  to allow for titration of rate control therapy -No evidence of bradycardic rates on telemetry  4. HLD:  -Most recent lipid panel from 2022 with an LDL of 176, target LDL less than 70  -Start atorvastatin  80 mg  -Obtain lipid panel    For questions or updates, please contact [facility] Please consult [email or web address] for contact info under Cardiology/STEMI.   Signed, Bernardino Bring, PA-C [facility] Pager: [phone] 09/15/2023, 3:58 PM

## 2023-09-15 NOTE — ED Provider Notes (Signed)
 [facility] Provider Note    Event Date/Time   First MD Initiated Contact with Patient 09/15/23 1216     (approximate)   History   Shortness of Breath and Leg Swelling   HPI  Keith Bates is a 83 y.o. male with no prior cardiac history who comes in with increasing shortness of breath, swelling in the legs.  Patient was seen at primary care doctor and sent over to the ER for evaluation due to concern for new diagnosis of CHF.  Patient reports this 1 has been going on since December however he however he reports that there is been increasing shortness of breath over the past few days.   Physical Exam   Triage Vital Signs: ED Triage Vitals  Encounter Vitals Group     BP 09/15/23 1044 (!) 167/87     Systolic BP Percentile --      Diastolic BP Percentile --      Pulse Rate 09/15/23 1042 (!) 43     Resp 09/15/23 1042 20     Temp 09/15/23 1042 98 F (36.7 C)     Temp Source 09/15/23 1042 Oral     SpO2 09/15/23 1042 98 %     Weight --      Height --      Head Circumference --      Peak Flow --      Pain Score 09/15/23 1043 0     Pain Loc --      Pain Education --      Exclude from Growth Chart --     Most recent vital signs: Vitals:   09/15/23 1042 09/15/23 1044  BP:  (!) 167/87  Pulse: (!) 43   Resp: 20   Temp: 98 F (36.7 C)   SpO2: 98%      General: Awake, no distress.  CV:  Good peripheral perfusion.  Resp:  Normal effort.  Abd:  No distention.  Other:  Swelling in legs bilaterally worse on the right.  Warm and well-perfused feet   ED Results / Procedures / Treatments   Labs (all labs ordered are listed, but only abnormal results are displayed) Labs Reviewed  BASIC METABOLIC PANEL - Abnormal; Notable for the following components:      Result Value   CO2 19 (*)    Glucose, Bld 104 (*)    All other components within normal limits  CBC - Abnormal; Notable for the following components:   Hemoglobin 12.5 (*)    RDW 17.2 (*)     All other components within normal limits  BRAIN NATRIURETIC PEPTIDE  TROPONIN I (HIGH SENSITIVITY)     EKG  My interpretation of EKG:  Atrial flutter with a rate of 78 without any ST elevation or T wave inversions, normal intervals  RADIOLOGY I have reviewed the CT personally interpreted and patient has a rounded density noted in the mid lung with some small pleural effusions  PROCEDURES:  Critical Care performed: No  Procedures   MEDICATIONS ORDERED IN ED: Medications  iohexol  (OMNIPAQUE ) 350 MG/ML injection 75 mL (75 mLs Intravenous Contrast Given 09/15/23 1325)     IMPRESSION / MDM / ASSESSMENT AND PLAN / ED COURSE  I reviewed the triage vital signs and the nursing notes.   Patient's presentation is most consistent with acute presentation with potential threat to life or bodily function.   Patient comes in with concerns for shortness of breath, leg swelling.  Patient has new atrial  flutter on EKG.  He is not on any anticoagulation.  I suspect this is most likely CHF, ACS, consider PE.  X-ray concerning for nodule recommend CT PE.  PE scan is still pending at time of disposition but regardless patient needs to be admitted to the hospital due to concern for new CHF versus PE.  CBC shows hemoglobin of 12.5 BMP reassuring slightly low bicarb.  Troponin elevated suspect demand. Will hold off on heparin  for now.   CT read/leg US  is pending at time of disposition.  The patient is on the cardiac monitor to evaluate for evidence of arrhythmia and/or significant heart rate changes.      FINAL CLINICAL IMPRESSION(S) / ED DIAGNOSES   Final diagnoses:  Acute congestive heart failure, unspecified heart failure type (HCC)     Rx / DC Orders   ED Discharge Orders     None        Note:  This document was prepared using Dragon voice recognition software and may include unintentional dictation errors.   Ernest Ronal BRAVO, MD 09/15/23 [phone]

## 2023-09-15 NOTE — ED Triage Notes (Signed)
 Pt to ED via POV from [facility]. Pt reports SOB and BLE swelling x2 days. Pt reports swelling worse in right leg. Pt denies hx of CHF. Pt denies CP.

## 2023-09-15 NOTE — ED Notes (Signed)
 See triage notes. Patient c/o shortness of breath and bilateral leg swelling times two days.

## 2023-09-15 NOTE — ED Notes (Signed)
 Two attempts for IV access were made by this Paramedic without success

## 2023-09-16 ENCOUNTER — Inpatient Hospital Stay (HOSPITAL_COMMUNITY)
Admit: 2023-09-16 | Discharge: 2023-09-16 | Disposition: A | Discharge status: Home / Self Care | Payer: Medicare Other | Attending: Physician Assistant | Admitting: Physician Assistant

## 2023-09-16 DIAGNOSIS — I5031 Acute diastolic (congestive) heart failure: Secondary | ICD-10-CM

## 2023-09-16 DIAGNOSIS — R7989 Other specified abnormal findings of blood chemistry: Secondary | ICD-10-CM

## 2023-09-16 DIAGNOSIS — I4891 Unspecified atrial fibrillation: Secondary | ICD-10-CM | POA: Diagnosis not present

## 2023-09-16 DIAGNOSIS — I4892 Unspecified atrial flutter: Secondary | ICD-10-CM | POA: Diagnosis not present

## 2023-09-16 DIAGNOSIS — I82401 Acute embolism and thrombosis of unspecified deep veins of right lower extremity: Secondary | ICD-10-CM

## 2023-09-16 DIAGNOSIS — I82411 Acute embolism and thrombosis of right femoral vein: Secondary | ICD-10-CM

## 2023-09-16 DIAGNOSIS — I5021 Acute systolic (congestive) heart failure: Secondary | ICD-10-CM | POA: Diagnosis not present

## 2023-09-16 DIAGNOSIS — I2510 Atherosclerotic heart disease of native coronary artery without angina pectoris: Secondary | ICD-10-CM | POA: Diagnosis not present

## 2023-09-16 DIAGNOSIS — I509 Heart failure, unspecified: Secondary | ICD-10-CM | POA: Diagnosis not present

## 2023-09-16 LAB — BASIC METABOLIC PANEL
Anion gap: 9 (ref 5–15)
BUN: 15 mg/dL (ref 8–23)
CO2: 25 mmol/L (ref 22–32)
Calcium: 8.9 mg/dL (ref 8.9–10.3)
Chloride: 105 mmol/L (ref 98–111)
Creatinine, Ser: 0.79 mg/dL (ref 0.61–1.24)
GFR, Estimated: >60 mL/min (ref >60)
Glucose, Bld: 110 mg/dL — ABNORMAL HIGH (ref 70–99)
Potassium: 3.7 mmol/L (ref 3.5–5.1)
Sodium: 139 mmol/L (ref 135–145)

## 2023-09-16 LAB — URINALYSIS, COMPLETE (UACMP) WITH MICROSCOPIC
Bacteria, UA: NONE SEEN
Bilirubin Urine: NEGATIVE
Glucose, UA: NEGATIVE mg/dL
Hgb urine dipstick: NEGATIVE
Ketones, ur: NEGATIVE mg/dL
Leukocytes,Ua: NEGATIVE
Nitrite: NEGATIVE
Protein, ur: NEGATIVE mg/dL
Specific Gravity, Urine: 1.003 — ABNORMAL LOW (ref 1.005–1.030)
Squamous Epithelial / HPF: 0 /[HPF] (ref 0–5)
pH: 7 (ref 5.0–8.0)

## 2023-09-16 LAB — PROTIME-INR
INR: 1.3 — ABNORMAL HIGH (ref 0.8–1.2)
Prothrombin Time: 16.7 s — ABNORMAL HIGH (ref 11.4–15.2)

## 2023-09-16 LAB — ECHOCARDIOGRAM COMPLETE
AR max vel: 1.35 cm2
AV Area VTI: 1.46 cm2
AV Area mean vel: 1.35 cm2
AV Mean grad: 7 mm[Hg]
AV Peak grad: 12.3 mm[Hg]
Ao pk vel: 1.75 m/s
Area-P 1/2: 4.04 cm2
Calc EF: 35.8 %
Height: 71 in
MV VTI: 2.31 cm2
S' Lateral: 4 cm
Single Plane A2C EF: 38.1 %
Single Plane A4C EF: 37.7 %
Weight: 2271.62 [oz_av]

## 2023-09-16 LAB — APTT: aPTT: 25 s (ref 24–36)

## 2023-09-16 LAB — HEPARIN LEVEL (UNFRACTIONATED): Heparin Unfractionated: 0.16 [IU]/mL — ABNORMAL LOW (ref 0.30–0.70)

## 2023-09-16 LAB — HEPATIC FUNCTION PANEL
ALT: 32 U/L (ref 0–44)
AST: 32 U/L (ref 15–41)
Albumin: 3.5 g/dL (ref 3.5–5.0)
Alkaline Phosphatase: 74 U/L (ref 38–126)
Bilirubin, Direct: 0.3 mg/dL — ABNORMAL HIGH (ref 0.0–0.2)
Indirect Bilirubin: 2.2 mg/dL — ABNORMAL HIGH (ref 0.3–0.9)
Total Bilirubin: 2.5 mg/dL — ABNORMAL HIGH (ref 0.0–1.2)
Total Protein: 6.5 g/dL (ref 6.5–8.1)

## 2023-09-16 LAB — LIPID PANEL
Cholesterol: 156 mg/dL (ref 0–200)
HDL: 41 mg/dL (ref >40)
LDL Cholesterol: 104 mg/dL — ABNORMAL HIGH (ref 0–99)
Total CHOL/HDL Ratio: 3.8 {ratio}
Triglycerides: 56 mg/dL (ref <150)
VLDL: 11 mg/dL (ref 0–40)

## 2023-09-16 MED ORDER — HEPARIN BOLUS VIA INFUSION
3000.0000 [IU] | Freq: Once | INTRAVENOUS | Status: AC
  Administered 2023-09-16: 3000 [IU] via INTRAVENOUS
  Filled 2023-09-16: qty 3000

## 2023-09-16 MED ORDER — HEPARIN (PORCINE) 25000 UT/250ML-% IV SOLN
1100.0000 [IU]/h | INTRAVENOUS | Status: DC
  Administered 2023-09-16: 900 [IU]/h via INTRAVENOUS
  Administered 2023-09-17 – 2023-09-19 (×3): 1100 [IU]/h via INTRAVENOUS
  Filled 2023-09-16 (×5): qty 250

## 2023-09-16 MED ORDER — HEPARIN BOLUS VIA INFUSION
2000.0000 [IU] | Freq: Once | INTRAVENOUS | Status: AC
  Administered 2023-09-16: 2000 [IU] via INTRAVENOUS
  Filled 2023-09-16: qty 2000

## 2023-09-16 MED ORDER — HYDROCORTISONE 1 % EX CREA
TOPICAL_CREAM | Freq: Three times a day (TID) | CUTANEOUS | Status: DC | PRN
  Filled 2023-09-16: qty 28

## 2023-09-16 NOTE — Progress Notes (Signed)
 Progress Note  Patient Name: Keith Bates Date of Encounter: 09/16/2023  Primary Cardiologist: New - consult by Dr. Darron, MD   Subjective   Reports breathing is a little improved when compared to yesterday.  No chest pain, palpitations, dizziness, presyncope, or syncope.  Documented urine output 1.1 L for the admission.  Labs pending.  Inpatient Medications    Scheduled Meds:  aspirin  EC  81 mg Oral Daily   atorvastatin   80 mg Oral Daily   furosemide   40 mg Intravenous BID   lisinopril   20 mg Oral Daily   metoprolol  tartrate  25 mg Oral BID   sodium chloride  flush  3 mL Intravenous Q12H   Continuous Infusions:  sodium chloride      PRN Meds: sodium chloride , acetaminophen , HYDROcodone -acetaminophen , hydrocortisone  cream, ondansetron  (ZOFRAN ) IV, sodium chloride  flush   Vital Signs    Vitals:   09/16/23 0100 09/16/23 0200 09/16/23 0400 09/16/23 0700  BP: 133/87 (!) 134/92 (!) 152/96 (!) 143/95  Pulse:   68 75  Resp: 16 (!) 28 18 13   Temp:   97.9 F (36.6 C)   TempSrc:   Oral   SpO2: 100% 99% 100% 100%  Weight:      Height:        Intake/Output Summary (Last 24 hours) at 09/16/2023 9078 Last data filed at 09/16/2023 9276 Gross per 24 hour  Intake --  Output 1100 ml  Net -1100 ml   Filed Weights   09/15/23 1255  Weight: 64.4 kg    Telemetry    Atrial flutter with variable AV block with occasional PVCs versus aberrancy with ventricular couplets, triplets, and NSVT lasting up to 10 beats versus Ashman's phenomenon - Personally Reviewed  ECG    No new tracings - Personally Reviewed  Physical Exam   GEN: No acute distress.   Neck: JVD elevated approximately 8 cm. Cardiac: Irregularly irregular, no murmurs, rubs, or gallops.  Respiratory: Breath sounds diminished at the bases bilaterally.  GI: Soft, nontender, non-distended.   MS: 2-3+ bilateral lower extremity pitting edema; No deformity. Neuro:  Alert and oriented x 3; Nonfocal.  Psych: Normal  affect.  Labs    Chemistry Recent Labs  Lab 09/15/23 1044  NA 140  K 4.2  CL 107  CO2 19*  GLUCOSE 104*  BUN 12  CREATININE 0.71  CALCIUM  9.1  GFRNONAA >60  ANIONGAP 14     Hematology Recent Labs  Lab 09/15/23 1044  WBC 7.9  RBC 4.32  HGB 12.5*  HCT 40.1  MCV 92.8  MCH 28.9  MCHC 31.2  RDW 17.2*  PLT 207    Cardiac EnzymesNo results for input(s): TROPONINI in the last 168 hours. No results for input(s): TROPIPOC in the last 168 hours.   BNP Recent Labs  Lab 09/15/23 1044  BNP 707.9*     DDimer No results for input(s): DDIMER in the last 168 hours.   Radiology    US  Venous Img Lower Bilateral Result Date: 09/15/2023 IMPRESSION: 1. Extensive right lower extremity deep venous thrombosis. This appears more acute in appearance with nonocclusive thrombus in the common femoral vein, occlusive thrombus in the femoral and popliteal veins and thrombus in the calf veins. 2. 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 Electronically Signed   By: Marcey Moan M.D.   On: 09/15/2023 16:21   CT Angio Chest PE W and/or Wo  Contrast Result Date: 09/15/2023 IMPRESSION: 1. No evidence of pulmonary embolus. Decreased enhancement of the posterior branches of the bilateral lower lobe pulmonary arteries is felt to be due to adjacent atelectasis and flow related phenomenon. 2. Cardiomegaly, with prominent biatrial dilatation. Reflux of contrast into the hepatic veins suggest underlying cardiac dysfunction. Echocardiography may be useful. 3. Bilateral pleural effusions, right greater than left. Loculated fluid at the confluence of the right major and minor fissures corresponds to the rounded density seen on chest x-ray. 4. Dependent areas of compressive atelectasis within the bilateral lower lobes, right greater than left. 5. Aortic Atherosclerosis  (ICD10-I70.0). Coronary artery atherosclerosis. Electronically Signed   By: Ozell Daring M.D.   On: 09/15/2023 15:12   DG Chest 2 View Result Date: 09/15/2023 IMPRESSION: Rounded density noted laterally in right midlung concerning for possible pulmonary nodule or mass. CT scan of the chest with intravenous contrast is recommended. Minimal bibasilar subsegmental atelectasis is noted with small pleural effusions. Electronically Signed   By: Lynwood Landy Raddle M.D.   On: 09/15/2023 11:43    Cardiac Studies   2D echo pending  Patient Profile     83 y.o. male with history of CAD noted on CT imaging, prediabetes, hyperlipidemia, prostate cancer, rheumatoid arthritis, and osteoarthritis who is being seen today for the evaluation of CHF at the request of Dr. Laurita.   Assessment & Plan    1. CHF:  -Uncertain etiology and chronicity at this time  -Cannot exclude ischemic etiology given documented coronary artery calcification, advanced age with male status, hyperlipidemia, and history of prediabetes, though cannot exclude tachycardia mediated cardiomyopathy in the setting of his atrial flutter -Volume up  -Diuretic nave, started on furosemide  40 mg twice daily upon admission  -Reasonable to continue this dose of furosemide  for now with close monitoring of urine output and renal function with recommendation to achieve net -1 to 2 L/day with recommendation to adjust furosemide  dose accordingly  -Obtain echo with recommendation to escalate GDMT as indicated based on findings -Daily weights, strict I's and O's   2. CAD with elevated high-sensitivity troponin:  -No chest pain -Initial high-sensitivity troponin 44 with delta troponin of 50 -Obtain echo as above with further recommendations regarding further testing pending results  -Heparin  drip as outlined below -ASA  -Obtain lipid panel and A1c for further risk stratification   3. Atrial flutter:  -Uncertain chronicity  -Echo pending  -Potassium  and magnesium  at goal; TSH normal -CHA2DS2-VASc at least 4 (CHF, age x 2, vascular disease)  -After discussion with Dr. Darron, we will challenge the patient on heparin  drip while admitted -Will benefit from cardioversion following 3 weeks of uninterrupted anticoagulation, or TEE guided DCCV if rate control is difficult -Consider atrial flutter ablation by EP in the outpatient setting, and would be able to defer long-term OAC thereafter otherwise, may need to consider Watchman -For now, pursue rate control with metoprolol  25 mg twice daily -May need to hold lisinopril  to allow for titration of rate control therapy -No evidence of bradycardic rates on telemetry  4. HLD:  -Most recent lipid panel from 2022 with an LDL of 176, target LDL less than 70  -Now on atorvastatin  80 mg  -Obtain lipid panel      For questions or updates, please contact [facility] Please consult [email or web address] for contact info under Cardiology/STEMI.    Signed, Bernardino Bring, PA-C [facility] Pager: [phone] 09/16/2023, 9:21 AM

## 2023-09-16 NOTE — ED Notes (Signed)
 Pt is difficult IV stick for 2nd IV before starting heparin . Pt stuck multiple times. IV team consult placed.

## 2023-09-16 NOTE — Consult Note (Signed)
  PHARMACY - ANTICOAGULATION CONSULT NOTE  Pharmacy Consult for Heparin  infusion Indication: atrial fibrillation  Allergies  Allergen Reactions   Other Rash    Styrofoam - rash and almost went blind   Plasticized Base [Plastibase] Rash   Penicillins Rash    Patient Measurements: Height: 5' 11 (180.3 cm) Weight: 64.4 kg (141 lb 15.6 oz) IBW/kg (Calculated) : 75.3 Heparin  Dosing Weight: 64.4 kg  Vital Signs: Temp: 97.9 F (36.6 C) (02/08 0400) Temp Source: Oral (02/08 0400) BP: 143/95 (02/08 0700) Pulse Rate: 75 (02/08 0700)  Labs: Recent Labs    09/15/23 1044 09/15/23 1556  HGB 12.5*  --   HCT 40.1  --   PLT 207  --   CREATININE 0.71  --   TROPONINIHS 44* 50*    Estimated Creatinine Clearance: 64.8 mL/min (by C-G formula based on SCr of 0.71 mg/dL).   Medical History: Past Medical History:  Diagnosis Date   Arthritis    DDD (degenerative disc disease)    Enlarged prostate    Environmental allergies    GERD (gastroesophageal reflux disease)    Hx of ulcer disease    Loss of weight    Nocturia    Prostate cancer (HCC)    Unspecified hemorrhoids without mention of complication     Medications:  No AC noted prior to admission.  Assessment: Patient admitted for acute HF, and increased shortness of breath. PHM includes CAD noted on CT imaging, prediabetes, hyperlipidemia, prostate cancer, rheumatoid arthritis, and osteoarthritis.  Troponin trending upward, ECHO pending, and new onset Afib noted. Pharmacy consulted to initiate heparin  infusion for Afib. Plan to transition to DOAC prior to discharge.  *Baseline Hgb and plt appropriate to initiate therapy. Baseline aPTT and INR ordered.  Goal of Therapy:  Heparin  level 0.3-0.7 units/ml Monitor platelets by anticoagulation protocol: Yes   Plan:  Give 3000 units bolus x 1 Start heparin  infusion at 900 units/hr Check anti-Xa level in 8 hours and daily while on heparin  Continue to monitor H&H and  platelets  Charli Halle Rodriguez-Guzman PharmD, BCPS 09/16/2023 10:12 AM

## 2023-09-16 NOTE — Progress Notes (Signed)
*  PRELIMINARY RESULTS* Echocardiogram 2D Echocardiogram has been performed.  Keith Bates 09/16/2023, 10:57 AM

## 2023-09-16 NOTE — ED Notes (Signed)
 Echo in rm at this time. Delay in med pass.

## 2023-09-16 NOTE — Progress Notes (Signed)
 PT Cancellation Note  Patient Details Name: Zymarion Favorite MRN: [id number] DOB: 06-15-1941   Cancelled Treatment:    Reason Eval/Treat Not Completed: Patient not medically ready.  Per chart review pt diagnosed with acute DVT requiring anticoagulation.  Will attempt to see pt at a future date/time as medically appropriate.    CHARM Glendia Bertin PT, DPT 09/16/23, 11:34 AM

## 2023-09-16 NOTE — ED Notes (Signed)
 RN called for labs, have informed phlebotomist Megan twice. Called her at [phone]. If not completed please call her directly.

## 2023-09-16 NOTE — Progress Notes (Addendum)
 OT Cancellation Note  Patient Details Name: Keith Bates MRN: [id number] DOB: Jul 18, 1941   Cancelled Treatment:    Reason Eval/Treat Not Completed: Patient not medically ready. Consult received, chart reviewed. Pt noted with acute RLE DVT, heparin  drip initiated 2/8 10:30am. Per therapy protocol, hold at this time. Will attempt OT evaluation at later date/time as appropriate.  Rhys Anchondo R., MPH, MS, OTR/L ascom [phone] 09/16/23, 11:02 AM

## 2023-09-16 NOTE — Progress Notes (Signed)
 Progress Note   Patient: Keith Bates FMW:[id number] DOB: 1941-05-27 DOA: 09/15/2023     1 DOS: the patient was seen and examined on 09/16/2023   Brief hospital course: Kinston Magnan is a 83 y.o. male with medical history significant of internal hemorrhoids, prostate cancer on hormone manipulation, presented with worsening of exertional dyspnea and leg swelling.   Patient is admitted to the hospitalist service for further management evaluation of acute CHF.  Echo showed EF 35-40%, global hypokinesis. Cardiology evaluated him. Patient is found to have right leg DVT and started on heparin  drip as per pharmacy protocol.  Assessment and Plan: Acute systolic CHF  Echo shows EF 35-40%, global hypokinesis. Continue IV diuresis Lasix  40 mg twice daily Cardiology evaluation appreciated.  Started on beta-blocker, ACE inhibitor.  Further GDMT if BP tolerates. Daily I and Os. Monitor daily weights.   A flutter, rate controlled Started on heparin  drip as he is positive for acute DVT. CHADS score 3.  Started on metoprolol  therapy. Continue telemetry monitoring.  Acute right leg DVT: CTA chest negative for PE. Ultrasound shows placement of extensive right lower extremity DVT. Continue heparin  drip as per pharmacy protocol. Monitor aptt, bleeding risk explained.   CAD with elevated troponin Started heparin  drip per pharmacy protocol. Further cardiac work up as per cardiology recommendations. He denies chest pain.  Hypertension Continue metoprolol , lisinopril .  Hyperlipidemia- Continue atorvastatin .  History of prostate cancer Stable, get hormonal injection every 6 months Patient follow-up.       Out of bed to chair. Incentive spirometry. Nursing supportive care. Fall, aspiration precautions. DVT prophylaxis   Code Status: Full Code  Subjective: Patient is seen and examined today morning. He is weak, currently on 2L supplemental o2. Did not get out of bed. Right leg swollen, has  DVT on heparin  drip per protocol.  Physical Exam: Vitals:   09/16/23 1136 09/16/23 1300 09/16/23 1351 09/16/23 1449  BP: 131/87 (!) 144/94  115/84  Pulse:    (!) 59  Resp:  20  18  Temp:    (!) 97.4 F (36.3 C)  TempSrc:    Oral  SpO2: 100%  97% 98%  Weight:      Height:        General - Elderly African-American in male, no apparent distress HEENT - PERRLA, EOMI, atraumatic head, non tender sinuses. Lung - Clear, basal rales, rhonchi, no wheezes. Heart - S1, S2 heard, no murmurs, rubs, right greater than left pedal edema. Abdomen - Soft, non tender, nondistended, bowel sounds good Neuro - Alert, awake and oriented x 3, non focal exam. Skin - Warm and dry.  Right greater than left lower extremity swelling  Data Reviewed:      Latest Ref Rng & Units 09/15/2023   10:44 AM 07/06/2021    7:53 AM 04/27/2020    8:37 AM  CBC  WBC 4.0 - 10.5 K/uL 7.9  6.6  6.5   Hemoglobin 13.0 - 17.0 g/dL 87.4  85.6  85.9   Hematocrit 39.0 - 52.0 % 40.1  42.5  43.5   Platelets 150 - 400 K/uL 207  225  213       Latest Ref Rng & Units 09/16/2023    9:16 AM 09/15/2023   10:44 AM 07/06/2021    7:53 AM  BMP  Glucose 70 - 99 mg/dL 889  895  96   BUN 8 - 23 mg/dL 15  12  14    Creatinine 0.61 - 1.24 mg/dL 9.20  9.28  0.69   BUN/Creat Ratio 6 - 22 (calc)   20   Sodium 135 - 145 mmol/L 139  140  142   Potassium 3.5 - 5.1 mmol/L 3.7  4.2  3.9   Chloride 98 - 111 mmol/L 105  107  106   CO2 22 - 32 mmol/L 25  19  26    Calcium  8.9 - 10.3 mg/dL 8.9  9.1  9.6    US  Venous Img Lower Bilateral Result Date: 09/15/2023 CLINICAL DATA:  Bilateral lower extremity edema. EXAM: BILATERAL LOWER EXTREMITY VENOUS DOPPLER ULTRASOUND TECHNIQUE: Gray-scale sonography with graded compression, as well as color Doppler and duplex ultrasound were performed to evaluate the lower extremity deep venous systems from the level of the common femoral vein and including the common femoral, femoral, profunda femoral, popliteal and calf  veins including the posterior tibial, peroneal and gastrocnemius veins when visible. The superficial great saphenous vein was also interrogated. Spectral Doppler was utilized to evaluate flow at rest and with distal augmentation maneuvers in the common femoral, femoral and popliteal veins. COMPARISON:  None Available. FINDINGS: RIGHT LOWER EXTREMITY Common Femoral Vein: Nonocclusive thrombus present. Saphenofemoral Junction: Nonocclusive thrombus at the saphenofemoral junction. Profunda Femoral Vein: Thrombus crosses the right profunda femoral origin but there does appear to be good flow in the sample profunda femoral vein. Femoral Vein: Occlusive thrombus present. Popliteal Vein: Occlusive thrombus present. Calf Veins: Visualized segments of the right posterior tibial and peroneal veins demonstrate thrombus. Superficial Great Saphenous Vein: No evidence of thrombus. Normal compressibility. Venous Reflux:  None. Other Findings: No evidence of superficial thrombophlebitis or abnormal fluid collection. LEFT LOWER EXTREMITY Common Femoral Vein: No evidence of thrombus. Normal compressibility, respiratory phasicity and response to augmentation. Saphenofemoral Junction: No evidence of thrombus. Normal compressibility and flow on color Doppler imaging. Profunda Femoral Vein: No evidence of thrombus. Normal compressibility and flow on color Doppler imaging. 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 Popliteal Vein: There is some nonocclusive mild mural thrombus in the popliteal vein as well likely representing chronic thrombus based on appearance. Calf Veins: No evidence of thrombus. Normal compressibility and flow on color Doppler imaging. Superficial Great Saphenous Vein: No evidence of thrombus. Normal compressibility. Venous Reflux:  None. Other Findings: No evidence of superficial thrombophlebitis or abnormal fluid  collection. IMPRESSION: 1. Extensive right lower extremity deep venous thrombosis. This appears more acute in appearance with nonocclusive thrombus in the common femoral vein, occlusive thrombus in the femoral and popliteal veins and thrombus in the calf veins. 2. 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 Electronically Signed   By: Marcey Moan M.D.   On: 09/15/2023 16:21   CT Angio Chest PE W and/or Wo Contrast Result Date: 09/15/2023 CLINICAL DATA:  Pulmonary embolism (PE) suspected, high prob, short of breath for several months, abnormal chest x-ray EXAM: CT ANGIOGRAPHY CHEST WITH CONTRAST TECHNIQUE: Multidetector CT imaging of the chest was performed using the standard protocol during bolus administration of intravenous contrast. Multiplanar CT image reconstructions and MIPs were obtained to evaluate the vascular anatomy. RADIATION DOSE REDUCTION: This exam was performed according to the departmental dose-optimization program which includes automated exposure control, adjustment of the mA and/or kV according to patient size and/or use of iterative reconstruction technique. CONTRAST:  75mL OMNIPAQUE  IOHEXOL  350 MG/ML SOLN COMPARISON:  09/15/2023, 10/02/2006 FINDINGS: Cardiovascular: This  is a technically adequate evaluation of the pulmonary vasculature. There is relative decreased enhancement of the posterior branches of the bilateral lower lobe pulmonary arteries, felt to be due to timing of contrast bolus and adjacent atelectasis rather than true pulmonary emboli. There are no discrete filling defects or pulmonary emboli identified within the remaining pulmonary vasculature. The heart is enlarged without pericardial effusion. There is significant reflux of contrast into the hepatic veins, suggesting cardiac dysfunction. Prominent biatrial dilatation. Normal caliber  of the thoracic aorta. Atherosclerosis of the aorta and coronary vasculature. Mediastinum/Nodes: Mediastinal lymphadenopathy identified, measuring up to 1.3 cm in the subcarinal station. Segmental air esophagram within the upper thoracic esophagus, of uncertain significance. Thyroid and trachea are unremarkable. Lungs/Pleura: There are small bilateral pleural effusions, right greater than left. There is a rounded area of loculated fluid at the confluence of the major and minor fissures within the right midlung, corresponding to the rounded density seen on chest x-ray. There is bilateral lower lobe atelectasis, right greater than left. No pneumothorax. Central airways are patent. Upper Abdomen: No acute abnormality. Musculoskeletal: There are no acute displaced fractures. 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 Reconstructed images demonstrate no additional findings. Review of the MIP images confirms the above findings. IMPRESSION: 1. No evidence of pulmonary embolus. Decreased enhancement of the posterior branches of the bilateral lower lobe pulmonary arteries is felt to be due to adjacent atelectasis and flow related phenomenon. 2. Cardiomegaly, with prominent biatrial dilatation. Reflux of contrast into the hepatic veins suggest underlying cardiac dysfunction. Echocardiography may be useful. 3. Bilateral pleural effusions, right greater than left. Loculated fluid at the confluence of the right major and minor fissures corresponds to the rounded density seen on chest x-ray. 4. Dependent areas of compressive atelectasis within the bilateral lower lobes, right greater than left. 5. Aortic Atherosclerosis (ICD10-I70.0). Coronary artery atherosclerosis. Electronically Signed   By: Ozell Daring M.D.   On: 09/15/2023 15:12   DG Chest 2 View Result Date: 09/15/2023 CLINICAL DATA:  Shortness of breath for  several months. EXAM: CHEST - 2 VIEW COMPARISON:  July 28, 2013. FINDINGS: Mild cardiomegaly is noted. Minimal bibasilar subsegmental atelectasis is noted with small pleural effusions. Rounded density is noted laterally in right midlung concerning for possible pulmonary nodule. Severe degenerative changes are seen involving both glenohumeral joints. IMPRESSION: Rounded density noted laterally in right midlung concerning for possible pulmonary nodule or mass. CT scan of the chest with intravenous contrast is recommended. Minimal bibasilar subsegmental atelectasis is noted with small pleural effusions. Electronically Signed   By: Lynwood Landy Raddle M.D.   On: 09/15/2023 11:43   Family Communication: Discussed with patient, wife at bedside they understand and agree. All questions answereed.  Disposition: Status is: Inpatient Remains inpatient appropriate because: Right lower extremity DVT, atrial flutter, CHF  Planned Discharge Destination: Home with Home Health     Time spent: 42 minutes  Author: Concepcion Riser, MD 09/16/2023 3:36 PM Secure chat 7am to 7pm For on call review [email or web address].

## 2023-09-16 NOTE — ED Notes (Signed)
Called lab for blood draw at this time

## 2023-09-16 NOTE — Plan of Care (Signed)

## 2023-09-16 NOTE — Consult Note (Signed)
  PHARMACY - ANTICOAGULATION CONSULT NOTE  Pharmacy Consult for Heparin  infusion Indication: atrial fibrillation  Allergies  Allergen Reactions   Other Rash    Styrofoam - rash and almost went blind   Plasticized Base [Plastibase] Rash   Penicillins Rash    Patient Measurements: Height: 5' 11 (180.3 cm) Weight: 64.4 kg (141 lb 15.6 oz) IBW/kg (Calculated) : 75.3 Heparin  Dosing Weight: 64.4 kg  Vital Signs: Temp: 97.4 F (36.3 C) (02/08 1449) Temp Source: Oral (02/08 1449) BP: 115/84 (02/08 1449) Pulse Rate: 59 (02/08 1449)  Labs: Recent Labs    09/15/23 1044 09/15/23 1556 09/16/23 0916 09/16/23 1122 09/16/23 1920  HGB 12.5*  --   --   --   --   HCT 40.1  --   --   --   --   PLT 207  --   --   --   --   APTT  --   --   --  25  --   LABPROT  --   --   --  16.7*  --   INR  --   --   --  1.3*  --   HEPARINUNFRC  --   --   --   --  0.16*  CREATININE 0.71  --  0.79  --   --   TROPONINIHS 44* 50*  --   --   --     Estimated Creatinine Clearance: 64.8 mL/min (by C-G formula based on SCr of 0.79 mg/dL).   Medical History: Past Medical History:  Diagnosis Date   Arthritis    DDD (degenerative disc disease)    Enlarged prostate    Environmental allergies    GERD (gastroesophageal reflux disease)    Hx of ulcer disease    Loss of weight    Nocturia    Prostate cancer (HCC)    Unspecified hemorrhoids without mention of complication     Medications:  No AC noted prior to admission.  Assessment: Patient admitted for acute HF, and increased shortness of breath. PHM includes CAD noted on CT imaging, prediabetes, hyperlipidemia, prostate cancer, rheumatoid arthritis, and osteoarthritis.  Troponin trending upward, ECHO pending, and new onset Afib noted. Pharmacy consulted to initiate heparin  infusion for Afib. Plan to transition to DOAC prior to discharge.  *Baseline Hgb and plt appropriate to initiate therapy. Baseline aPTT and INR ordered.  2/8  19:20  HL  0.16, SUBtherapeutic running at 900 units/hr  Goal of Therapy:  Heparin  level 0.3-0.7 units/ml Monitor platelets by anticoagulation protocol: Yes   Plan:  -HL is SUBtherapeutic -Heparin  2000 units IV bolus and increase Heparin  drip to 1100 units/hr -Recheck HL 8 hours after rate change -Daily CBC while on Heparin  drip  Olam Fritter, PharmD, BCPS 09/16/2023 8:00 PM

## 2023-09-17 DIAGNOSIS — I4892 Unspecified atrial flutter: Secondary | ICD-10-CM

## 2023-09-17 DIAGNOSIS — C61 Malignant neoplasm of prostate: Secondary | ICD-10-CM

## 2023-09-17 DIAGNOSIS — I4891 Unspecified atrial fibrillation: Secondary | ICD-10-CM | POA: Diagnosis not present

## 2023-09-17 DIAGNOSIS — E7800 Pure hypercholesterolemia, unspecified: Secondary | ICD-10-CM

## 2023-09-17 DIAGNOSIS — I420 Dilated cardiomyopathy: Secondary | ICD-10-CM

## 2023-09-17 DIAGNOSIS — I5021 Acute systolic (congestive) heart failure: Secondary | ICD-10-CM

## 2023-09-17 DIAGNOSIS — R7989 Other specified abnormal findings of blood chemistry: Secondary | ICD-10-CM | POA: Diagnosis not present

## 2023-09-17 DIAGNOSIS — E876 Hypokalemia: Secondary | ICD-10-CM

## 2023-09-17 DIAGNOSIS — I2510 Atherosclerotic heart disease of native coronary artery without angina pectoris: Secondary | ICD-10-CM | POA: Diagnosis not present

## 2023-09-17 DIAGNOSIS — I82411 Acute embolism and thrombosis of right femoral vein: Secondary | ICD-10-CM | POA: Diagnosis not present

## 2023-09-17 LAB — CBC
HCT: 36.8 % — ABNORMAL LOW (ref 39.0–52.0)
Hemoglobin: 12.1 g/dL — ABNORMAL LOW (ref 13.0–17.0)
MCH: 28.9 pg (ref 26.0–34.0)
MCHC: 32.9 g/dL (ref 30.0–36.0)
MCV: 88 fL (ref 80.0–100.0)
Platelets: 215 10*3/uL (ref 150–400)
RBC: 4.18 MIL/uL — ABNORMAL LOW (ref 4.22–5.81)
RDW: 17.2 % — ABNORMAL HIGH (ref 11.5–15.5)
WBC: 10.2 10*3/uL (ref 4.0–10.5)
nRBC: 0 % (ref 0.0–0.2)

## 2023-09-17 LAB — BASIC METABOLIC PANEL
Anion gap: 9 (ref 5–15)
BUN: 16 mg/dL (ref 8–23)
CO2: 25 mmol/L (ref 22–32)
Calcium: 8.5 mg/dL — ABNORMAL LOW (ref 8.9–10.3)
Chloride: 100 mmol/L (ref 98–111)
Creatinine, Ser: 0.75 mg/dL (ref 0.61–1.24)
GFR, Estimated: >60 mL/min (ref >60)
Glucose, Bld: 138 mg/dL — ABNORMAL HIGH (ref 70–99)
Potassium: 3 mmol/L — ABNORMAL LOW (ref 3.5–5.1)
Sodium: 134 mmol/L — ABNORMAL LOW (ref 135–145)

## 2023-09-17 LAB — HEPATIC FUNCTION PANEL
ALT: 36 U/L (ref 0–44)
AST: 32 U/L (ref 15–41)
Albumin: 3.3 g/dL — ABNORMAL LOW (ref 3.5–5.0)
Alkaline Phosphatase: 69 U/L (ref 38–126)
Bilirubin, Direct: 0.3 mg/dL — ABNORMAL HIGH (ref 0.0–0.2)
Indirect Bilirubin: 2.5 mg/dL — ABNORMAL HIGH (ref 0.3–0.9)
Total Bilirubin: 2.8 mg/dL — ABNORMAL HIGH (ref 0.0–1.2)
Total Protein: 6.5 g/dL (ref 6.5–8.1)

## 2023-09-17 LAB — HEPARIN LEVEL (UNFRACTIONATED)
Heparin Unfractionated: 0.49 [IU]/mL (ref 0.30–0.70)
Heparin Unfractionated: 0.43 [IU]/mL (ref 0.30–0.70)

## 2023-09-17 MED ORDER — LOSARTAN POTASSIUM 25 MG PO TABS
25.0000 mg | ORAL_TABLET | Freq: Every day | ORAL | Status: AC
  Administered 2023-09-17 – 2023-09-19 (×3): 25 mg via ORAL
  Filled 2023-09-17 (×3): qty 1

## 2023-09-17 MED ORDER — POTASSIUM CHLORIDE 20 MEQ PO PACK
40.0000 meq | PACK | Freq: Three times a day (TID) | ORAL | Status: AC
  Administered 2023-09-17 (×3): 40 meq via ORAL
  Filled 2023-09-17 (×3): qty 2

## 2023-09-17 MED ORDER — SPIRONOLACTONE 12.5 MG HALF TABLET
12.5000 mg | ORAL_TABLET | Freq: Every day | ORAL | Status: DC
  Administered 2023-09-17: 12.5 mg via ORAL
  Filled 2023-09-17 (×2): qty 1

## 2023-09-17 MED ORDER — POTASSIUM CHLORIDE 20 MEQ PO PACK: 40.0000 meq | PACK | Freq: Every day | ORAL | Status: DC

## 2023-09-17 NOTE — Plan of Care (Signed)
  Problem: Clinical Measurements: Goal: Ability to maintain clinical measurements within normal limits will improve Outcome: Progressing Goal: Diagnostic test results will improve Outcome: Progressing Goal: Respiratory complications will improve Outcome: Progressing   

## 2023-09-17 NOTE — Progress Notes (Signed)
 Progress Note  Patient Name: Keith Bates Date of Encounter: 09/17/2023  Primary Cardiologist: New - consult by Dr. Darron, MD   Subjective   Reports breathing is a little improved when compared to yesterday.  No chest pain, palpitations, dizziness, presyncope, or syncope.  Documented urine output 3.7 L for the past 24 hours, net - 5.5 L for the admission. Renal function stable. Challenged with IV heparin  on 2/7. Hgb stable. Echo showed an EF of 35-40%, global hypokinesis, severe LVH of the basal-septal segment, normal RV systolic function and ventricular cavity size, severely elevated RVSP estimated at 63.4 mmHg, severe biatrial enlargement, mild to moderate mitral and tricuspid regurgitation, mild to moderate aortic stenosis with concern for low flow low gradient stenosis, and an estimated right right atrial pressure of 15 mmHg. Lover extremity venous ultrasound with extensive right lower extremity DVT felt to be acute in appearance with nonocclusive thrombus in the common femoral vein, occlusive thrombus in the femoral and popliteal veins and thrombus in the calf veins. 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  Inpatient Medications    Scheduled Meds:  aspirin  EC  81 mg Oral Daily   atorvastatin   80 mg Oral Daily   furosemide   40 mg Intravenous BID   losartan   25 mg Oral Daily   metoprolol  tartrate  25 mg Oral BID   potassium chloride   40 mEq Oral TID   sodium chloride  flush  3 mL Intravenous Q12H   Continuous Infusions:  heparin  1,100 Units/hr (09/17/23 0726)   PRN Meds: acetaminophen , HYDROcodone -acetaminophen , hydrocortisone  cream, ondansetron  (ZOFRAN ) IV, sodium chloride  flush   Vital Signs    Vitals:   09/17/23 0053 09/17/23 0457 09/17/23 0510 09/17/23 0837  BP: 135/88 130/81  137/81  Pulse: (!) 58 75  62  Resp: 18 18  17   Temp: 99.3 F (37.4 C) 98.8 F (37.1 C)  99 F (37.2  C)  TempSrc:    Oral  SpO2: 96% 92%  97%  Weight:   65 kg   Height:        Intake/Output Summary (Last 24 hours) at 09/17/2023 0923 Last data filed at 09/17/2023 0837 Gross per 24 hour  Intake 306.34 ml  Output 4800 ml  Net -4493.66 ml   Filed Weights   09/15/23 1255 09/17/23 0510  Weight: 64.4 kg 65 kg    Telemetry    Atrial flutter with variable AV block with occasional PVCs versus aberrancy with ventricular couplets, triplets, and NSVT lasting up to 10 beats versus Ashman's phenomenon - Personally Reviewed  ECG    No new tracings - Personally Reviewed  Physical Exam   GEN: No acute distress.   Neck: JVD elevated approximately 8 cm. Cardiac: Irregularly irregular, no murmurs, rubs, or gallops.  Respiratory: Breath sounds diminished at the bases bilaterally.  GI: Soft, nontender, non-distended.   MS: 1-2+ bilateral lower extremity pitting edema; No deformity. Neuro:  Alert and oriented x 3; Nonfocal.  Psych: Normal affect.  Labs    Chemistry Recent Labs  Lab 09/15/23 1044 09/16/23 0916 09/17/23 0313  NA 140 139 134*  K 4.2 3.7 3.0*  CL 107 105 100  CO2 19* 25 25  GLUCOSE 104* 110* 138*  BUN 12 15 16   CREATININE 0.71 0.79 0.75  CALCIUM  9.1 8.9 8.5*  PROT  --  6.5  --   ALBUMIN  --  3.5  --   AST  --  32  --   ALT  --  32  --   ALKPHOS  --  74  --   BILITOT  --  2.5*  --   GFRNONAA >60 >60 >60  ANIONGAP 14 9 9      Hematology Recent Labs  Lab 09/15/23 1044 09/17/23 0313  WBC 7.9 10.2  RBC 4.32 4.18*  HGB 12.5* 12.1*  HCT 40.1 36.8*  MCV 92.8 88.0  MCH 28.9 28.9  MCHC 31.2 32.9  RDW 17.2* 17.2*  PLT 207 215    Cardiac EnzymesNo results for input(s): TROPONINI in the last 168 hours. No results for input(s): TROPIPOC in the last 168 hours.   BNP Recent Labs  Lab 09/15/23 1044  BNP 707.9*     DDimer No results for input(s): DDIMER in the last 168 hours.   Radiology    US  Venous Img Lower Bilateral Result Date:  09/15/2023 IMPRESSION: 1. Extensive right lower extremity deep venous thrombosis. This appears more acute in appearance with nonocclusive thrombus in the common femoral vein, occlusive thrombus in the femoral and popliteal veins and thrombus in the calf veins. 2. 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 Electronically Signed   By: Marcey Moan M.D.   On: 09/15/2023 16:21   CT Angio Chest PE W and/or Wo Contrast Result Date: 09/15/2023 IMPRESSION: 1. No evidence of pulmonary embolus. Decreased enhancement of the posterior branches of the bilateral lower lobe pulmonary arteries is felt to be due to adjacent atelectasis and flow related phenomenon. 2. Cardiomegaly, with prominent biatrial dilatation. Reflux of contrast into the hepatic veins suggest underlying cardiac dysfunction. Echocardiography may be useful. 3. Bilateral pleural effusions, right greater than left. Loculated fluid at the confluence of the right major and minor fissures corresponds to the rounded density seen on chest x-ray. 4. Dependent areas of compressive atelectasis within the bilateral lower lobes, right greater than left. 5. Aortic Atherosclerosis (ICD10-I70.0). Coronary artery atherosclerosis. Electronically Signed   By: Ozell Daring M.D.   On: 09/15/2023 15:12   DG Chest 2 View Result Date: 09/15/2023 IMPRESSION: Rounded density noted laterally in right midlung concerning for possible pulmonary nodule or mass. CT scan of the chest with intravenous contrast is recommended. Minimal bibasilar subsegmental atelectasis is noted with small pleural effusions. Electronically Signed   By: Lynwood Landy Raddle M.D.   On: 09/15/2023 11:43    Cardiac Studies   2D echo pending  Patient Profile     83 y.o. male with history of CAD noted on CT imaging, prediabetes, hyperlipidemia, prostate cancer,  rheumatoid arthritis, and osteoarthritis who is being seen today for the evaluation of CHF at the request of Dr. Laurita.   Assessment & Plan    1. Acute HFrEF:  -Uncertain etiology and chronicity at this time  -Cannot exclude ischemic etiology given documented coronary artery calcification, advanced age with male status, hyperlipidemia, and history of prediabetes, though cannot exclude tachycardia mediated cardiomyopathy in the setting of his atrial flutter -Remains volume up  -Diuretic nave, started on furosemide  40 mg twice daily upon admission  -Reasonable to continue this dose of furosemide  for now given vigorous UOP  -Echo with EF 35-40% with global HK -Stop lisinopril , start losartan  25 mg with plans to transition to Entresto  following 36 hour washout of ACEi -Lopressor  for now given atrial flutter with recommendation to consolidate to Toprol  XL or change to Coreg  prior to discharge given cardiomyopathy -Escalate GDMT as able moving forward  -Will need R/LHC prior to discharge following diuresis  -Daily weights, strict I's and O's   2. CAD with elevated high-sensitivity troponin:  -No chest pain -Initial high-sensitivity troponin 44 with delta troponin of 50 -Echo as above -Will need R/LHC prior to discharge  -Heparin  drip as outlined below -ASA, Lipitor    3. Atrial flutter:  -Uncertain chronicity  -Echo with cardiomyopathy as above -Magnesium  at goal; TSH normal -CHA2DS2-VASc at least 4 (CHF, age x 2, vascular disease)  -Continue challenge on heparin  drip while admitted -Will benefit from cardioversion following 3 weeks of uninterrupted anticoagulation, or TEE guided DCCV if rate control is difficult -Consider atrial flutter ablation by EP in the outpatient setting, and would be able to defer long-term OAC thereafter otherwise, may need to consider Watchman -For now, pursue rate control with metoprolol  25 mg twice daily -May need to hold lisinopril  to allow for titration of  rate control therapy -No evidence of bradycardic rates on telemetry  4. Aortic stenosis/mitral regurgitation: -Concern for low flow low gradient aortic stenosis -Will need R/LHC following diuresis prior to discharge   5. HLD:  -LDL 104, goal LDL at least < 70 -Now on atorvastatin  80 mg   6. Hypokalemia: -KCl 40 mEq tid today -Replete to goal 4.0 -Magnesium  at goal  7. 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 History of GI bleed secondary to hemorrhoids: -Denies current bleeding -Hgb stable on heparin  gtt -Monitor      For questions or updates, please contact [facility] Please consult [email or web address] for contact info under Cardiology/STEMI.    Signed, Bernardino Bring, PA-C [facility] Pager: [phone] 09/17/2023, 9:23 AM

## 2023-09-17 NOTE — Consult Note (Signed)
  PHARMACY - ANTICOAGULATION CONSULT NOTE  Pharmacy Consult for Heparin  infusion Indication: atrial fibrillation  Allergies  Allergen Reactions   Other Rash    Styrofoam - rash and almost went blind   Plasticized Base [Plastibase] Rash   Penicillins Rash    Patient Measurements: Height: 5' 11 (180.3 cm) Weight: 64.4 kg (141 lb 15.6 oz) IBW/kg (Calculated) : 75.3 Heparin  Dosing Weight: 64.4 kg  Vital Signs: Temp: 99.3 F (37.4 C) (02/09 0053) BP: 135/88 (02/09 0053) Pulse Rate: 58 (02/09 0053)  Labs: Recent Labs    09/15/23 1044 09/15/23 1556 09/16/23 0916 09/16/23 1122 09/16/23 1920 09/17/23 0313  HGB 12.5*  --   --   --   --  12.1*  HCT 40.1  --   --   --   --  36.8*  PLT 207  --   --   --   --  215  APTT  --   --   --  25  --   --   LABPROT  --   --   --  16.7*  --   --   INR  --   --   --  1.3*  --   --   HEPARINUNFRC  --   --   --   --  0.16* 0.49  CREATININE 0.71  --  0.79  --   --  0.75  TROPONINIHS 44* 50*  --   --   --   --     Estimated Creatinine Clearance: 64.8 mL/min (by C-G formula based on SCr of 0.75 mg/dL).   Medical History: Past Medical History:  Diagnosis Date   Arthritis    DDD (degenerative disc disease)    Enlarged prostate    Environmental allergies    GERD (gastroesophageal reflux disease)    Hx of ulcer disease    Loss of weight    Nocturia    Prostate cancer (HCC)    Unspecified hemorrhoids without mention of complication     Medications:  No AC noted prior to admission.  Assessment: Patient admitted for acute HF, and increased shortness of breath. PHM includes CAD noted on CT imaging, prediabetes, hyperlipidemia, prostate cancer, rheumatoid arthritis, and osteoarthritis.  Troponin trending upward, ECHO pending, and new onset Afib noted. Pharmacy consulted to initiate heparin  infusion for Afib. Plan to transition to DOAC prior to discharge.  *Baseline Hgb and plt appropriate to initiate therapy. Baseline aPTT and INR  ordered.  2/8  19:20  HL 0.16, SUBtherapeutic running at 900 units/hr 2/9 0313 HL 0.49, therapeutic x 1  Goal of Therapy:  Heparin  level 0.3-0.7 units/ml Monitor platelets by anticoagulation protocol: Yes   Plan:  -Continue heparin  drip at 1100 units/hr -Recheck HL 8 hours to confirm -Daily CBC while on Heparin  drip  Rankin CANDIE Dills, PharmD, [facility] 09/17/2023 4:45 AM

## 2023-09-17 NOTE — Consult Note (Signed)
  PHARMACY - ANTICOAGULATION CONSULT NOTE  Pharmacy Consult for Heparin  infusion Indication: atrial fibrillation  Allergies  Allergen Reactions   Other Rash    Styrofoam - rash and almost went blind   Plasticized Base [Plastibase] Rash   Penicillins Rash    Patient Measurements: Height: 5' 11 (180.3 cm) Weight: 65 kg (143 lb 4.8 oz) IBW/kg (Calculated) : 75.3 Heparin  Dosing Weight: 64.4 kg  Vital Signs: Temp: 99 F (37.2 C) (02/09 0837) Temp Source: Oral (02/09 0837) BP: 137/81 (02/09 0837) Pulse Rate: 62 (02/09 0837)  Labs: Recent Labs    09/15/23 1044 09/15/23 1556 09/16/23 0916 09/16/23 1122 09/16/23 1920 09/17/23 0313 09/17/23 1101  HGB 12.5*  --   --   --   --  12.1*  --   HCT 40.1  --   --   --   --  36.8*  --   PLT 207  --   --   --   --  215  --   APTT  --   --   --  25  --   --   --   LABPROT  --   --   --  16.7*  --   --   --   INR  --   --   --  1.3*  --   --   --   HEPARINUNFRC  --   --   --   --  0.16* 0.49 0.43  CREATININE 0.71  --  0.79  --   --  0.75  --   TROPONINIHS 44* 50*  --   --   --   --   --     Estimated Creatinine Clearance: 65.5 mL/min (by C-G formula based on SCr of 0.75 mg/dL).   Medical History: Past Medical History:  Diagnosis Date   Arthritis    DDD (degenerative disc disease)    Enlarged prostate    Environmental allergies    GERD (gastroesophageal reflux disease)    Hx of ulcer disease    Loss of weight    Nocturia    Prostate cancer (HCC)    Unspecified hemorrhoids without mention of complication     Medications:  No AC noted prior to admission.  Assessment: Patient admitted for acute HF, and increased shortness of breath. PHM includes CAD noted on CT imaging, prediabetes, hyperlipidemia, prostate cancer, rheumatoid arthritis, and osteoarthritis.  Troponin trending upward, ECHO pending, and new onset Afib noted. Pharmacy consulted to initiate heparin  infusion for Afib. Plan to transition to DOAC prior to  discharge.  *Baseline Hgb and plt appropriate to initiate therapy. Baseline aPTT and INR ordered.  2/8   19:20  HL 0.16, SUBtherapeutic running at 900 units/hr 2/9   0313 HL 0.49 therapeutic x 1 0209 1101 HL 0.43 Therapeutic x 2  Goal of Therapy:  Heparin  level 0.3-0.7 units/ml Monitor platelets by anticoagulation protocol: Yes   Plan:  -0209 1101 HL 0.43 Therapeutic x 2 -Continue heparin  drip at 1100 units/hr -check HL with am labs -Daily CBC while on Heparin  drip  Serafina Topham PharmD Clinical Pharmacist 09/17/2023

## 2023-09-17 NOTE — Evaluation (Signed)
 Physical Therapy Evaluation Patient Details Name: Keith Bates MRN: [id number] DOB: 10-19-1940 Today's Date: 09/17/2023  History of Present Illness  Pt is an 83 yo M with PMH that includes internal hemorrhoids and prostate cancer on hormone manipulation who presented with worsening of exertional dyspnea and leg swelling.  MD assessment includes: Acute systolic CHF, A flutter, RLE DVT, and CAD with elevated troponin.   Clinical Impression  Pt was pleasant and motivated to participate during the session and put forth good effort throughout. Pt required extra time and effort with sup to sit along with UE assist for RLE management.  Pt required extensive BUE assist and general time and effort to come to standing from multiple height surfaces.  Pt was able to amb 2 x 10 feet with rest break between bouts with very slow, effortful cadence and heavy lean on the RW during RLE stance phase.  Pt will benefit from continued PT services upon discharge to safely address deficits listed in patient problem list for decreased caregiver assistance and eventual return to PLOF.          If plan is discharge home, recommend the following: A little help with walking and/or transfers;A little help with bathing/dressing/bathroom;Assistance with cooking/housework;Assist for transportation;Help with stairs or ramp for entrance   Can travel by private vehicle   Yes    Equipment Recommendations Rolling walker (2 wheels);BSC/3in1  Recommendations for Other Services       Functional Status Assessment Patient has had a recent decline in their functional status and demonstrates the ability to make significant improvements in function in a reasonable and predictable amount of time.     Precautions / Restrictions Precautions Precautions: Fall Restrictions Weight Bearing Restrictions Per Provider Order: No      Mobility  Bed Mobility Overal bed mobility: Modified Independent             General bed mobility  comments: Extra time and effort and use of UEs to assist RLE out of bed    Transfers Overall transfer level: Needs assistance Equipment used: Rolling walker (2 wheels) Transfers: Sit to/from Stand Sit to Stand: Contact guard assist           General transfer comment: Extra time and effort and use of heavy use of UEs assist to come to standing but no physical assist required    Ambulation/Gait Ambulation/Gait assistance: Contact guard assist Gait Distance (Feet): 10 Feet x 2 Assistive device: Rolling walker (2 wheels) Gait Pattern/deviations: Step-to pattern, Decreased step length - left, Decreased stance time - right, Antalgic, Trunk flexed Gait velocity: decreased     General Gait Details: Step-to pattern training provided to address RLE pain with weight bearing with pt demonstrating good carryover; very slow, effortful cadence antalgic on the RLE with trunk flexed and with heavy lean on the RW for support  Stairs            Wheelchair Mobility     Tilt Bed    Modified Rankin (Stroke Patients Only)       Balance Overall balance assessment: Needs assistance   Sitting balance-Leahy Scale: Normal     Standing balance support: Bilateral upper extremity supported, During functional activity, Reliant on assistive device for balance Standing balance-Leahy Scale: Fair                               Pertinent Vitals/Pain Pain Assessment Pain Assessment: No/denies pain    Home Living  Family/patient expects to be discharged to:: Private residence Living Arrangements: Spouse/significant other;Children;Other relatives Available Help at Discharge: Family;Available 24 hours/day Type of Home: House Home Access: Stairs to enter Entrance Stairs-Rails: None Entrance Stairs-Number of Steps: 1 Alternate Level Stairs-Number of Steps: 6 Home Layout: Two level;Bed/bath upstairs Home Equipment: Cane - single point      Prior Function Prior Level of Function :  Independent/Modified Independent             Mobility Comments: Ind amb without an AD community distances, no fall history ADLs Comments: Ind with ADLs, sponge bathes secondary to fear of falling     Extremity/Trunk Assessment   Upper Extremity Assessment Upper Extremity Assessment: Overall WFL for tasks assessed    Lower Extremity Assessment Lower Extremity Assessment: Generalized weakness;RLE deficits/detail RLE Deficits / Details: RLE weaker than left with pt requiring UE assist to manage the RLE during sup to sit       Communication   Communication Communication: No apparent difficulties Cueing Techniques: Verbal cues;Visual cues  Cognition Arousal: Alert Behavior During Therapy: WFL for tasks assessed/performed Overall Cognitive Status: Within Functional Limits for tasks assessed                                          General Comments      Exercises     Assessment/Plan    PT Assessment Patient needs continued PT services  PT Problem List Decreased strength;Decreased range of motion;Decreased activity tolerance;Decreased balance;Decreased mobility;Decreased knowledge of use of DME;Pain       PT Treatment Interventions DME instruction;Gait training;Stair training;Functional mobility training;Therapeutic activities;Therapeutic exercise;Balance training;Patient/family education    PT Goals (Current goals can be found in the Care Plan section)  Acute Rehab PT Goals Patient Stated Goal: To get stronger PT Goal Formulation: With patient Time For Goal Achievement: 09/30/23 Potential to Achieve Goals: Good    Frequency Min 1X/week     Co-evaluation               AM-PAC PT 6 Clicks Mobility  Outcome Measure Help needed turning from your back to your side while in a flat bed without using bedrails?: A Little Help needed moving from lying on your back to sitting on the side of a flat bed without using bedrails?: A Little Help needed  moving to and from a bed to a chair (including a wheelchair)?: A Little Help needed standing up from a chair using your arms (e.g., wheelchair or bedside chair)?: A Little Help needed to walk in hospital room?: A Little Help needed climbing 3-5 steps with a railing? : A Lot 6 Click Score: 17    End of Session Equipment Utilized During Treatment: Gait belt Activity Tolerance: Patient tolerated treatment well Patient left: in chair;with call bell/phone within reach;with chair alarm set;with family/visitor present Nurse Communication: Mobility status PT Visit Diagnosis: Difficulty in walking, not elsewhere classified (R26.2);Muscle weakness (generalized) (M62.81);Pain Pain - Right/Left: Right Pain - part of body: Leg    Time: 1120-1156 PT Time Calculation (min) (ACUTE ONLY): 36 min   Charges:   PT Evaluation $PT Eval Moderate Complexity: 1 Mod PT Treatments $Gait Training: 8-22 mins PT General Charges $$ ACUTE PT VISIT: 1 Visit       D. Glendia Bertin PT, DPT 09/17/23, 12:16 PM

## 2023-09-17 NOTE — Progress Notes (Signed)
 Progress Note   Patient: Keith Bates FMW:[id number] DOB: Feb 15, 1941 DOA: 09/15/2023     2 DOS: the patient was seen and examined on 09/17/2023   Brief hospital course: Keith Bates is a 83 y.o. male with medical history significant of internal hemorrhoids, prostate cancer on hormone manipulation, presented with worsening of exertional dyspnea and leg swelling.   Patient is admitted to the hospitalist service for further management evaluation of acute CHF.  Echo showed EF 35-40%, global hypokinesis. Cardiology evaluated him. Patient is found to have right leg DVT and started on heparin  drip as per pharmacy protocol.  Assessment and Plan: Acute systolic CHF  Echo shows EF 35-40%, global hypokinesis. Continue IV diuresis Lasix  40 mg twice daily Cardiology follow-up appreciated.  Continue beta-blocker, ARB.  Further GDMT if BP tolerates. Daily weights, strict I and Os, monitor urine output. Monitor electrolytes, renal function.   A flutter, rate controlled Continue heparin  drip as per pharmacy protocol. CHADS score 4.  Continue metoprolol  therapy. Cardiology advised cardioversion in 3 weeks, possible ablation by EP in outpatient setting. Continue telemetry monitoring. Watch for hemorrhoidal bleeding.  Acute right leg DVT: CTA chest negative for PE. Ultrasound shows of extensive right lower extremity DVT. Continue heparin  drip as per pharmacy protocol. Monitor aptt, bleeding risk explained.  Hypokalemia- Oral Kcl 40 TID ordered for 1 day. Continue to monitor daily electrolytes on IV diuresis.   CAD with elevated troponin Continue heparin  drip per pharmacy protocol. Cardiology recommended right and left heart cath prior to discharge. He denies chest pain, or shortness of breath.  Hypertension Continue metoprolol , lisinopril .  Hyperlipidemia- Continue atorvastatin .  History of prostate cancer Stable, get hormonal injection every 6 months  PT OT evaluation. Out of bed to  chair. Incentive spirometry. Nursing supportive care. Fall, aspiration precautions. DVT prophylaxis   Code Status: Full Code  Subjective: Patient is seen and examined today morning. He feels well today, currently off supplemental o2. Able to tolerate heparin . Advised to work with PT.  Physical Exam: Vitals:   09/17/23 0053 09/17/23 0457 09/17/23 0510 09/17/23 0837  BP: 135/88 130/81  137/81  Pulse: (!) 58 75  62  Resp: 18 18  17   Temp: 99.3 F (37.4 C) 98.8 F (37.1 C)  99 F (37.2 C)  TempSrc:    Oral  SpO2: 96% 92%  97%  Weight:   65 kg   Height:        General - Elderly African-American in male, no apparent distress HEENT - PERRLA, EOMI, atraumatic head, non tender sinuses. Lung - Clear, basal rales, rhonchi, no wheezes. Heart - S1, S2 heard, no murmurs, rubs, right greater than left pedal edema. Abdomen - Soft, non tender, nondistended, bowel sounds good Neuro - Alert, awake and oriented x 3, non focal exam. Skin - Warm and dry.  Right greater than left lower extremity swelling  Data Reviewed:      Latest Ref Rng & Units 09/17/2023    3:13 AM 09/15/2023   10:44 AM 07/06/2021    7:53 AM  CBC  WBC 4.0 - 10.5 K/uL 10.2  7.9  6.6   Hemoglobin 13.0 - 17.0 g/dL 87.8  87.4  85.6   Hematocrit 39.0 - 52.0 % 36.8  40.1  42.5   Platelets 150 - 400 K/uL 215  207  225       Latest Ref Rng & Units 09/17/2023    3:13 AM 09/16/2023    9:16 AM 09/15/2023   10:44 AM  BMP  Glucose 70 - 99 mg/dL 861  889  895   BUN 8 - 23 mg/dL 16  15  12    Creatinine 0.61 - 1.24 mg/dL 9.24  9.20  9.28   Sodium 135 - 145 mmol/L 134  139  140   Potassium 3.5 - 5.1 mmol/L 3.0  3.7  4.2   Chloride 98 - 111 mmol/L 100  105  107   CO2 22 - 32 mmol/L 25  25  19    Calcium  8.9 - 10.3 mg/dL 8.5  8.9  9.1    ECHOCARDIOGRAM COMPLETE Result Date: 09/16/2023    ECHOCARDIOGRAM REPORT   Patient Name:   Keith Bates Date of Exam: 09/16/2023 Medical Rec #:  [id number]    Height:       71.0 in Accession #:     [id number]   Weight:       142.0 lb Date of Birth:  07-Dec-1940     BSA:          1.823 m Patient Age:    83 years     BP:           143/93 mmHg Patient Gender: M            HR:           83 bpm. Exam Location:  [facility] Procedure: 2D Echo, Cardiac Doppler and Color Doppler Indications:     CHF-Acute Diastolic I50.31  History:         Patient has no prior history of Echocardiogram examinations.                  Cancer.  Sonographer:     Bari Roar Referring Phys:  [id number] RYAN M DUNN Diagnosing Phys: Wilbert Bihari MD IMPRESSIONS  1. Left ventricular ejection fraction, by estimation, is 35 to 40%. The left ventricle has moderately decreased function. The left ventricle demonstrates global hypokinesis. There is severe left ventricular hypertrophy of the basal-septal segment. Left ventricular diastolic function could not be evaluated.  2. Right ventricular systolic function is normal. The right ventricular size is normal. There is severely elevated pulmonary artery systolic pressure. The estimated right ventricular systolic pressure is 63.4 mmHg.  3. Left atrial size was severely dilated.  4. Right atrial size was severely dilated.  5. The mitral valve is degenerative. Mild to moderate mitral valve regurgitation. No evidence of mitral stenosis. The mean mitral valve gradient is 1.0 mmHg.  6. Tricuspid valve regurgitation is mild to moderate.  7. The aortic valve is tricuspid. There is moderate calcification of the aortic valve. There is moderate thickening of the aortic valve. Aortic valve regurgitation is not visualized. Mild to moderate aortic valve stenosis. Aortic valve area, by VTI measures 1.46 cm. Aortic valve mean gradient measures 7.0 mmHg. Aortic valve Vmax measures 1.75 m/s. Low SVI of 23 and low DVI of 0.38. 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 The inferior vena cava is dilated in size with <50% respiratory variability, suggesting right atrial pressure of 15  mmHg. FINDINGS  Left Ventricle: Left ventricular ejection fraction, by estimation, is 35 to 40%. The left ventricle has moderately decreased function. The left ventricle demonstrates global hypokinesis. The left ventricular internal cavity size was normal in size. There is severe left ventricular hypertrophy of the basal-septal segment. Abnormal (paradoxical) septal motion, consistent with left bundle branch block. Left ventricular diastolic function could not be evaluated due to atrial fibrillation. Left ventricular diastolic  function could not be evaluated. Normal left ventricular filling pressure. Right Ventricle: The right ventricular size is normal. No increase in right ventricular wall thickness. Right ventricular systolic function is normal. There is severely elevated pulmonary artery systolic pressure. The tricuspid regurgitant velocity is 3.48 m/s, and with an assumed right atrial pressure of 15 mmHg, the estimated right ventricular systolic pressure is 63.4 mmHg. Left Atrium: Left atrial size was severely dilated. Right Atrium: Right atrial size was severely dilated. Pericardium: There is no evidence of pericardial effusion. Mitral Valve: The mitral valve is degenerative in appearance. There is mild calcification of the mitral valve leaflet(s). Mild to moderate mitral annular calcification. Mild to moderate mitral valve regurgitation. No evidence of mitral valve stenosis. MV  peak gradient, 3.7 mmHg. The mean mitral valve gradient is 1.0 mmHg. Tricuspid Valve: The tricuspid valve is normal in structure. Tricuspid valve regurgitation is mild to moderate. No evidence of tricuspid stenosis. Aortic Valve: The aortic valve is tricuspid. There is moderate calcification of the aortic valve. There is moderate thickening of the aortic valve. Aortic valve regurgitation is not visualized. Mild to moderate aortic stenosis is present. Aortic valve mean gradient measures 7.0 mmHg. Aortic valve peak gradient measures  12.2 mmHg. Aortic valve area, by VTI measures 1.46 cm. Pulmonic Valve: The pulmonic valve was normal in structure. Pulmonic valve regurgitation is mild. No evidence of pulmonic stenosis. Aorta: The aortic root is normal in size and structure. Venous: The inferior vena cava is dilated in size with less than 50% respiratory variability, suggesting right atrial pressure of 15 mmHg. IAS/Shunts: No atrial level shunt detected by color flow Doppler.  LEFT VENTRICLE PLAX 2D LVIDd:         4.90 cm     Diastology LVIDs:         4.00 cm     LV e' medial:    7.29 cm/s LV PW:         1.20 cm     LV E/e' medial:  9.2 LV IVS:        1.60 cm     LV e' lateral:   9.90 cm/s LVOT diam:     2.20 cm     LV E/e' lateral: 6.8 LV SV:         42 LV SV Index:   23 LVOT Area:     3.80 cm  LV Volumes (MOD) LV vol d, MOD A2C: 96.8 ml LV vol d, MOD A4C: 90.2 ml LV vol s, MOD A2C: 59.9 ml LV vol s, MOD A4C: 56.2 ml LV SV MOD A2C:     36.9 ml LV SV MOD A4C:     90.2 ml LV SV MOD BP:      34.7 ml RIGHT VENTRICLE RV Basal diam:  3.00 cm RV Mid diam:    2.70 cm RV S prime:     11.90 cm/s TAPSE (M-mode): 1.3 cm LEFT ATRIUM              Index        RIGHT ATRIUM           Index LA diam:        5.10 cm  2.80 cm/m   RA Area:     25.70 cm LA Vol (A2C):   156.0 ml 85.58 ml/m  RA Volume:   77.10 ml  42.30 ml/m LA Vol (A4C):   117.0 ml 64.19 ml/m LA Biplane Vol: 137.0 ml 75.16 ml/m  AORTIC VALVE  PULMONIC VALVE AV Area (Vmax):    1.35 cm      PV Vmax:          1.09 m/s AV Area (Vmean):   1.35 cm      PV Peak grad:     4.8 mmHg AV Area (VTI):     1.46 cm      PR End Diast Vel: 11.16 msec AV Vmax:           175.00 cm/s   RVOT Peak grad:   2 mmHg AV Vmean:          121.000 cm/s AV VTI:            0.287 m AV Peak Grad:      12.2 mmHg AV Mean Grad:      7.0 mmHg LVOT Vmax:         62.30 cm/s LVOT Vmean:        43.100 cm/s LVOT VTI:          0.110 m LVOT/AV VTI ratio: 0.38  AORTA Ao Root diam: 2.60 cm Ao Asc diam:  3.00 cm MITRAL  VALVE               TRICUSPID VALVE MV Area (PHT): 4.04 cm    TR Peak grad:   48.4 mmHg MV Area VTI:   2.31 cm    TR Vmax:        348.00 cm/s MV Peak grad:  3.7 mmHg MV Mean grad:  1.0 mmHg    SHUNTS MV Vmax:       0.97 m/s    Systemic VTI:  0.11 m MV Vmean:      50.8 cm/s   Systemic Diam: 2.20 cm MV Decel Time: 188 msec MV E velocity: 67.10 cm/s MV A velocity: 37.70 cm/s MV E/A ratio:  1.78 Wilbert Bihari MD Electronically signed by Wilbert Bihari MD Signature Date/Time: 09/16/2023/3:37:12 PM    Final    US  Venous Img Lower Bilateral Result Date: 09/15/2023 CLINICAL DATA:  Bilateral lower extremity edema. EXAM: BILATERAL LOWER EXTREMITY VENOUS DOPPLER ULTRASOUND TECHNIQUE: Gray-scale sonography with graded compression, as well as color Doppler and duplex ultrasound were performed to evaluate the lower extremity deep venous systems from the level of the common femoral vein and including the common femoral, femoral, profunda femoral, popliteal and calf veins including the posterior tibial, peroneal and gastrocnemius veins when visible. The superficial great saphenous vein was also interrogated. Spectral Doppler was utilized to evaluate flow at rest and with distal augmentation maneuvers in the common femoral, femoral and popliteal veins. COMPARISON:  None Available. FINDINGS: RIGHT LOWER EXTREMITY Common Femoral Vein: Nonocclusive thrombus present. Saphenofemoral Junction: Nonocclusive thrombus at the saphenofemoral junction. Profunda Femoral Vein: Thrombus crosses the right profunda femoral origin but there does appear to be good flow in the sample profunda femoral vein. Femoral Vein: Occlusive thrombus present. Popliteal Vein: Occlusive thrombus present. Calf Veins: Visualized segments of the right posterior tibial and peroneal veins demonstrate thrombus. Superficial Great Saphenous Vein: No evidence of thrombus. Normal compressibility. Venous Reflux:  None. Other Findings: No evidence of superficial thrombophlebitis  or abnormal fluid collection. LEFT LOWER EXTREMITY Common Femoral Vein: No evidence of thrombus. Normal compressibility, respiratory phasicity and response to augmentation. Saphenofemoral Junction: No evidence of thrombus. Normal compressibility and flow on color Doppler imaging. Profunda Femoral Vein: No evidence of thrombus. Normal compressibility and flow on color Doppler imaging. Femoral Vein: Nonocclusive echogenic mural thrombus and wall thickening has the appearance most likely  consistent with chronic thrombus. Popliteal Vein: There is some nonocclusive mild mural thrombus in the popliteal vein as well likely representing chronic thrombus based on appearance. Calf Veins: No evidence of thrombus. Normal compressibility and flow on color Doppler imaging. Superficial Great Saphenous Vein: No evidence of thrombus. Normal compressibility. Venous Reflux:  None. Other Findings: No evidence of superficial thrombophlebitis or abnormal fluid collection. IMPRESSION: 1. Extensive right lower extremity deep venous thrombosis. This appears more acute in appearance with nonocclusive thrombus in the common femoral vein, occlusive thrombus in the femoral and popliteal veins and thrombus in the calf veins. 2. 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 Electronically Signed   By: Marcey Moan M.D.   On: 09/15/2023 16:21   CT Angio Chest PE W and/or Wo Contrast Result Date: 09/15/2023 CLINICAL DATA:  Pulmonary embolism (PE) suspected, high prob, short of breath for several months, abnormal chest x-ray EXAM: CT ANGIOGRAPHY CHEST WITH CONTRAST TECHNIQUE: Multidetector CT imaging of the chest was performed using the standard protocol during bolus administration of intravenous contrast. Multiplanar CT image reconstructions and MIPs were obtained to evaluate the vascular anatomy. RADIATION  DOSE REDUCTION: This exam was performed according to the departmental dose-optimization program which includes automated exposure control, adjustment of the mA and/or kV according to patient size and/or use of iterative reconstruction technique. CONTRAST:  75mL OMNIPAQUE  IOHEXOL  350 MG/ML SOLN COMPARISON:  09/15/2023, 10/02/2006 FINDINGS: Cardiovascular: This is a technically adequate evaluation of the pulmonary vasculature. There is relative decreased enhancement of the posterior branches of the bilateral lower lobe pulmonary arteries, felt to be due to timing of contrast bolus and adjacent atelectasis rather than true pulmonary emboli. There are no discrete filling defects or pulmonary emboli identified within the remaining pulmonary vasculature. The heart is enlarged without pericardial effusion. There is significant reflux of contrast into the hepatic veins, suggesting cardiac dysfunction. Prominent biatrial dilatation. Normal caliber of the thoracic aorta. Atherosclerosis of the aorta and coronary vasculature. Mediastinum/Nodes: Mediastinal lymphadenopathy identified, measuring up to 1.3 cm in the subcarinal station. Segmental air esophagram within the upper thoracic esophagus, of uncertain significance. Thyroid and trachea are unremarkable. Lungs/Pleura: There are small bilateral pleural effusions, right greater than left. There is a rounded area of loculated fluid at the confluence of the major and minor fissures within the right midlung, corresponding to the rounded density seen on chest x-ray. There is bilateral lower lobe atelectasis, right greater than left. No pneumothorax. Central airways are patent. Upper Abdomen: No acute abnormality. Musculoskeletal: There are no acute displaced fractures. 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 Reconstructed images demonstrate no additional findings.  Review of the MIP images confirms the above findings. IMPRESSION: 1. No evidence of pulmonary embolus. Decreased enhancement of the posterior branches of the bilateral lower lobe pulmonary arteries is felt to be due to adjacent atelectasis and flow related phenomenon. 2. Cardiomegaly, with prominent biatrial dilatation. Reflux of contrast into the hepatic veins suggest underlying cardiac dysfunction. Echocardiography may be useful. 3. Bilateral pleural effusions, right greater than left. Loculated fluid at the confluence of the right major and minor fissures corresponds to the rounded density seen on chest x-ray. 4. Dependent areas of compressive atelectasis within the bilateral lower lobes, right greater than left. 5. Aortic Atherosclerosis (ICD10-I70.0). Coronary artery atherosclerosis. Electronically Signed   By: Ozell  Delores M.D.   On: 09/15/2023 15:12   Family Communication: Discussed with patient, wife at bedside they understand and agree. All questions answereed.  Disposition: Status is: Inpatient Remains inpatient appropriate because: Right lower extremity DVT, atrial flutter, CHF  Planned Discharge Destination: Home with Home Health     Time spent: 38 minutes  Author: Concepcion Riser, MD 09/17/2023 11:39 AM Secure chat 7am to 7pm For on call review [email or web address].

## 2023-09-17 NOTE — Plan of Care (Signed)

## 2023-09-17 NOTE — Evaluation (Signed)
 Occupational Therapy Evaluation Patient Details Name: Keith Bates MRN: [id number] DOB: 05/11/1941 Today's Date: 09/17/2023   History of Present Illness Pt is an 83 yo M with PMH that includes internal hemorrhoids and prostate cancer on hormone manipulation who presented with worsening of exertional dyspnea and leg swelling.  MD assessment includes: Acute systolic CHF, A flutter, RLE DVT, and CAD with elevated troponin.   Clinical Impression   Pt received in recliner, family and RN in room. Prior to hospital admission, pt was independent with ADLs and mobility with no recent falls hx. Pt lives with spouse in multi-level home with 1 STE. Pt currently requires minA with RW for functional transfers, is able to take a few short steps forwards/backwards with heavy UE reliance on RW, movements effortful and guarded posture. Returns to recliner to perform seated grooming and oral care tasks with setup. Anticipate min-modA for LB ADLs due to deficits in strength, balance, & activity tolerance. Pt would benefit from skilled OT services to address noted impairments and functional limitations (see below for any additional details) in order to maximize safety and independence while minimizing falls risk and caregiver burden. Patient will benefit from continued inpatient follow up therapy, <3 hours/day       If plan is discharge home, recommend the following: A little help with walking and/or transfers;A little help with bathing/dressing/bathroom;Assistance with cooking/housework    Functional Status Assessment  Patient has had a recent decline in their functional status and demonstrates the ability to make significant improvements in function in a reasonable and predictable amount of time.  Equipment Recommendations  None recommended by OT (defer)       Precautions / Restrictions Precautions Precautions: Fall Restrictions Weight Bearing Restrictions Per Provider Order: No      Mobility Bed Mobility                General bed mobility comments: NT, pt in recliner start and end of session    Transfers Overall transfer level: Needs assistance Equipment used: Rolling walker (2 wheels) Transfers: Sit to/from Stand Sit to Stand: Min assist           General transfer comment: extra time, cues for hand placement, pt shifting weight to LLE due to discomfort RLE      Balance Overall balance assessment: Needs assistance Sitting-balance support: Feet supported, No upper extremity supported Sitting balance-Leahy Scale: Normal     Standing balance support: Bilateral upper extremity supported, During functional activity, Reliant on assistive device for balance Standing balance-Leahy Scale: Fair Standing balance comment: heavy reliance on UEs while standing, guarded posture due to discomfort                           ADL either performed or assessed with clinical judgement   ADL Overall ADL's : Needs assistance/impaired Eating/Feeding: Independent;Sitting Eating/Feeding Details (indicate cue type and reason): observed to drink without difficulties Grooming: Wash/dry face;Wash/dry hands;Sitting;Oral care Grooming Details (indicate cue type and reason): recliner level, uses LUE to brush teeth (hep drip RUE)                 Toilet Transfer: Moderate assistance;Minimal assistance;BSC/3in1;Rolling walker (2 wheels) Toilet Transfer Details (indicate cue type and reason): clinical judgement, pt will require increased time. Toileting- Clothing Manipulation and Hygiene: Minimal assistance (pt has been managing clothing seated to use urinal, reports he was up to bathroom earlier with RW)       Functional mobility during ADLs: Minimal  assistance;Rolling walker (2 wheels)        Pertinent Vitals/Pain Pain Assessment Pain Assessment: Faces Faces Pain Scale: Hurts little more Pain Location: RLE Pain Descriptors / Indicators: Grimacing, Guarding, Discomfort,  Pressure Pain Intervention(s): Limited activity within patient's tolerance, Monitored during session     Extremity/Trunk Assessment Upper Extremity Assessment Upper Extremity Assessment: Defer to OT evaluation;RUE deficits/detail   Lower Extremity Assessment Lower Extremity Assessment: Generalized weakness       Communication Communication Communication: No apparent difficulties Cueing Techniques: Verbal cues   Cognition Arousal: Alert Behavior During Therapy: WFL for tasks assessed/performed Overall Cognitive Status: Within Functional Limits for tasks assessed                                                  Home Living Family/patient expects to be discharged to:: Private residence Living Arrangements: Spouse/significant other;Children;Other relatives Available Help at Discharge: Family;Available 24 hours/day Type of Home: House Home Access: Stairs to enter [employer] of Steps: 1 Entrance Stairs-Rails: None Home Layout: Two level;Bed/bath upstairs Alternate Level Stairs-Number of Steps: 6 Alternate Level Stairs-Rails: Right Bathroom Shower/Tub: Chief Strategy Officer: Standard     Home Equipment: Cane - single point          Prior Functioning/Environment Prior Level of Function : Independent/Modified Independent             Mobility Comments: Ind amb without an AD community distances, no fall history ADLs Comments: Ind with ADLs, sponge bathes secondary to fear of falling        OT Problem List: Decreased strength;Decreased range of motion;Decreased activity tolerance;Impaired balance (sitting and/or standing);Decreased knowledge of use of DME or AE;Pain      OT Treatment/Interventions: Self-care/ADL training;Therapeutic exercise;Energy conservation;DME and/or AE instruction;Therapeutic activities;Patient/family education;Balance training    OT Goals(Current goals can be found in the care plan section) Acute  Rehab OT Goals Patient Stated Goal: I don't want to make a decision right now in regards to [facility] vs SNF. OT Goal Formulation: With patient/family Time For Goal Achievement: 10/01/23 Potential to Achieve Goals: Good  OT Frequency: Min 1X/week       AM-PAC OT 6 Clicks Daily Activity     Outcome Measure Help from another person eating meals?: None Help from another person taking care of personal grooming?: None Help from another person toileting, which includes using toliet, bedpan, or urinal?: A Little Help from another person bathing (including washing, rinsing, drying)?: A Little Help from another person to put on and taking off regular upper body clothing?: A Little Help from another person to put on and taking off regular lower body clothing?: A Little 6 Click Score: 20   End of Session Equipment Utilized During Treatment: Gait belt;Rolling walker (2 wheels) Nurse Communication: Mobility status  Activity Tolerance: Patient tolerated treatment well Patient left: in chair;with call bell/phone within reach;with chair alarm set;with family/visitor present  OT Visit Diagnosis: Muscle weakness (generalized) (M62.81);Other abnormalities of gait and mobility (R26.89);Unsteadiness on feet (R26.81);Pain Pain - Right/Left: Right Pain - part of body: Leg                Time: 8483-8452 OT Time Calculation (min): 31 min Charges:  OT General Charges $OT Visit: 1 Visit OT Evaluation $OT Eval Low Complexity: 1 Low OT Treatments $Self Care/Home Management : 8-22 mins  Malee Grays L.  [place] Stivers, OTR/L  09/17/23, 4:06 PM

## 2023-09-18 ENCOUNTER — Inpatient Hospital Stay: Payer: Medicare Other

## 2023-09-18 ENCOUNTER — Telehealth (HOSPITAL_COMMUNITY): Payer: Self-pay

## 2023-09-18 ENCOUNTER — Other Ambulatory Visit (HOSPITAL_COMMUNITY): Payer: Self-pay

## 2023-09-18 ENCOUNTER — Inpatient Hospital Stay (HOSPITAL_COMMUNITY): Payer: Medicare Other

## 2023-09-18 DIAGNOSIS — I5021 Acute systolic (congestive) heart failure: Secondary | ICD-10-CM | POA: Diagnosis not present

## 2023-09-18 DIAGNOSIS — I361 Nonrheumatic tricuspid (valve) insufficiency: Secondary | ICD-10-CM

## 2023-09-18 DIAGNOSIS — I350 Nonrheumatic aortic (valve) stenosis: Secondary | ICD-10-CM | POA: Diagnosis not present

## 2023-09-18 DIAGNOSIS — R7989 Other specified abnormal findings of blood chemistry: Secondary | ICD-10-CM | POA: Diagnosis not present

## 2023-09-18 DIAGNOSIS — I4891 Unspecified atrial fibrillation: Secondary | ICD-10-CM | POA: Diagnosis not present

## 2023-09-18 DIAGNOSIS — I82411 Acute embolism and thrombosis of right femoral vein: Secondary | ICD-10-CM | POA: Diagnosis not present

## 2023-09-18 LAB — HEPARIN LEVEL (UNFRACTIONATED): Heparin Unfractionated: 0.45 [IU]/mL (ref 0.30–0.70)

## 2023-09-18 LAB — CBC
HCT: 37.2 % — ABNORMAL LOW (ref 39.0–52.0)
Hemoglobin: 12.2 g/dL — ABNORMAL LOW (ref 13.0–17.0)
MCH: 29.2 pg (ref 26.0–34.0)
MCHC: 32.8 g/dL (ref 30.0–36.0)
MCV: 89 fL (ref 80.0–100.0)
Platelets: 223 10*3/uL (ref 150–400)
RBC: 4.18 MIL/uL — ABNORMAL LOW (ref 4.22–5.81)
RDW: 17.4 % — ABNORMAL HIGH (ref 11.5–15.5)
WBC: 10.1 10*3/uL (ref 4.0–10.5)
nRBC: 0 % (ref 0.0–0.2)

## 2023-09-18 LAB — BASIC METABOLIC PANEL
Anion gap: 8 (ref 5–15)
BUN: 21 mg/dL (ref 8–23)
CO2: 26 mmol/L (ref 22–32)
Calcium: 8.8 mg/dL — ABNORMAL LOW (ref 8.9–10.3)
Chloride: 102 mmol/L (ref 98–111)
Creatinine, Ser: 0.83 mg/dL (ref 0.61–1.24)
GFR, Estimated: >60 mL/min (ref >60)
Glucose, Bld: 112 mg/dL — ABNORMAL HIGH (ref 70–99)
Potassium: 4.3 mmol/L (ref 3.5–5.1)
Sodium: 136 mmol/L (ref 135–145)

## 2023-09-18 LAB — BILIRUBIN, FRACTIONATED(TOT/DIR/INDIR)
Bilirubin, Direct: 0.2 mg/dL (ref 0.0–0.2)
Indirect Bilirubin: 1.1 mg/dL — ABNORMAL HIGH (ref 0.3–0.9)
Total Bilirubin: 1.3 mg/dL — ABNORMAL HIGH (ref 0.0–1.2)

## 2023-09-18 LAB — PREALBUMIN: Prealbumin: 14 mg/dL — ABNORMAL LOW (ref 18–38)

## 2023-09-18 MED ORDER — SPIRONOLACTONE 25 MG PO TABS
25.0000 mg | ORAL_TABLET | Freq: Every day | ORAL | Status: DC
  Administered 2023-09-18 – 2023-09-22 (×4): 25 mg via ORAL
  Filled 2023-09-18 (×5): qty 1

## 2023-09-18 MED ORDER — AMIODARONE HCL 200 MG PO TABS
200.0000 mg | ORAL_TABLET | Freq: Every day | ORAL | Status: DC
  Administered 2023-09-18 – 2023-09-21 (×4): 200 mg via ORAL
  Filled 2023-09-18 (×4): qty 1

## 2023-09-18 MED ORDER — METOPROLOL SUCCINATE ER 25 MG PO TB24
25.0000 mg | ORAL_TABLET | Freq: Every day | ORAL | Status: DC
  Administered 2023-09-18 – 2023-09-20 (×3): 25 mg via ORAL
  Filled 2023-09-18 (×3): qty 1

## 2023-09-18 MED ORDER — DAPAGLIFLOZIN PROPANEDIOL 10 MG PO TABS
10.0000 mg | ORAL_TABLET | Freq: Every day | ORAL | Status: DC
  Administered 2023-09-18 – 2023-09-22 (×5): 10 mg via ORAL
  Filled 2023-09-18 (×5): qty 1

## 2023-09-18 MED ORDER — GADOBUTROL 1 MMOL/ML IV SOLN
9.0000 mL | Freq: Once | INTRAVENOUS | Status: AC | PRN
  Administered 2023-09-18: 9 mL via INTRAVENOUS

## 2023-09-18 NOTE — Plan of Care (Signed)
   Problem: Activity: Goal: Risk for activity intolerance will decrease Outcome: Progressing   Problem: Coping: Goal: Level of anxiety will decrease Outcome: Progressing   Problem: Pain Managment: Goal: General experience of comfort will improve and/or be controlled Outcome: Progressing

## 2023-09-18 NOTE — Consult Note (Signed)
 Advanced Heart Failure Team Consult Note   Primary Physician: Raina Bunting, DO Cardiologist:  None  Reason for Consultation: New-onset HF  HPI:    Keith Bates is an 83 y/o male whom we are asked to see by Dr. Nolan Battle for new-onset HF.   PMHx notable for arthritis and prostate CA on hormonal therapy. He has no previous cardiac history. He was admitted on 09/15/23 with several months of progressive HF symptoms   In ER he was noted to be hypertensive with a blood pressure of 167/87.  He was found to be in atrial flutter with RVR with rates ranging from the low 100s to 140s bpm. Oxygen saturations 98 to 100% on room air.  Labs notable for an initial high-sensitivity troponin 44.  Delta troponin pending.  BNP 707.  CXR  with a rounded density noted laterally in the right midlung concerning for possible pulmonary nodule versus mass.   CTA chest negative for PE. 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    Started on IV lasix  Now - 6.2 L. Weight recorded as up 5 pounds. On heparin  for AFL   Feels ok. Denies CP, SOB, orthopnea or PND. Remains in AFL 60-70s frequent PVCs    Home Medications Prior to Admission medications   Medication Sig Start Date End Date Taking? Authorizing Provider  acetaminophen  (TYLENOL ) 500 MG tablet Take 1 tablet (500 mg total) by mouth every 6 (six) hours as needed. 04/20/17  Yes Karamalegos, Kayleen Party, DO  flunisolide  (NASALIDE ) 25 MCG/ACT (0.025%) SOLN Place 2  sprays into the nose 2 (two) times daily. 09/16/22  Yes Karamalegos, Kayleen Party, DO  HYDROcodone -acetaminophen  (NORCO/VICODIN) 5-325 MG tablet Take 1 tablet by mouth every 8 (eight) hours as needed for moderate pain. 04/21/22  Yes Baity, Rankin Buzzard, NP  hydrocortisone  (ANUSOL -HC) 25 MG suppository Place 1 suppository (25 mg total) rectally 2 (two) times daily as needed for hemorrhoids or anal itching. For 7 days 12/13/19  Yes Karamalegos, Kayleen Party, DO  meloxicam  (MOBIC ) 7.5 MG tablet TAKE 1 TABLET (7.5 MG TOTAL) BY MOUTH DAILY AS NEEDED FOR PAIN. UP TO 1-2 WEEKS AS NEEDED 12/13/22  Yes Karamalegos, Kayleen Party, DO  Multiple Vitamin (MULTIVITAMIN) tablet Take 1 tablet by mouth daily.   Yes [provider]  Red Yeast Rice Extract (RED YEAST RICE PO) Take by mouth. Patient not taking: Reported on 12/30/2022    [provider]    Past Medical History: Past Medical History:  Diagnosis Date   Arthritis    DDD (degenerative disc disease)    Enlarged prostate    Environmental allergies    GERD (gastroesophageal reflux disease)    Hx of ulcer disease    Loss of weight    Nocturia    Prostate cancer (HCC)    Unspecified hemorrhoids without mention of complication     Past Surgical History: Past Surgical History:  Procedure Laterality Date   COLONOSCOPY  03/13/2013   HEMORRHOID  BANDING     LYMPHADENECTOMY Bilateral 06/21/2013   Procedure: LYMPHADENECTOMY  " BILATERAL PELVIC LYMPH NODE DISSECTION ;  Surgeon: Osborn Blaze, MD;  Location: [facility];  Service: Urology;  Laterality: Bilateral;   ROBOT ASSISTED LAPAROSCOPIC RADICAL PROSTATECTOMY N/A 06/21/2013   Procedure: ROBOTIC ASSISTED LAPAROSCOPIC RADICAL PROSTATECTOMY;  Surgeon: Osborn Blaze, MD;  Location: [facility];  Service: Urology;  Laterality: N/A;    Family History: Family History  Problem Relation Age of Onset   Prostate cancer Father        with mets to nodes   Cancer Brother        colon and prostate   Heart attack  Mother     Social History: Social History   Socioeconomic History   Marital status: Married    Spouse name: Not on file   Number of children: Not on file   Years of education: Not on file   Highest education level: Not on file  Occupational History   Occupation: retired  Tobacco Use   Smoking status: Former    Current packs/day: 0.00    Average packs/day: 1.5 packs/day for 10.0 years (15.0 ttl pk-yrs)    Types: Cigarettes    Start date: 06/06/1962    Quit date: 06/06/1972    Years since quitting: 51.3   Smokeless tobacco: Former  Building services engineer status: Former  Substance and Sexual Activity   Alcohol use: No   Drug use: No   Sexual activity: Not Currently  Other Topics Concern   Not on file  Social History Narrative   Not on file   Social Drivers of Health   Financial Resource Strain: Low Risk  (12/30/2022)   Overall Financial Resource Strain (CARDIA)    Difficulty of Paying Living Expenses: Not hard at all  Food Insecurity: No Food Insecurity (09/16/2023)   Hunger Vital Sign    Worried About Running Out of Food in the Last Year: Never true    Ran Out of Food in the Last Year: Never true  Transportation Needs: No Transportation Needs (12/30/2022)   PRAPARE - Administrator, Civil Service (Medical): No    Lack of Transportation (Non-Medical): No  Physical Activity: Insufficiently Active (12/30/2022)   Exercise Vital Sign    Days of Exercise per Week: 7 days    Minutes of Exercise per Session: 20 min  Stress: No Stress Concern Present (12/30/2022)   [employer] of Occupational Health - Occupational Stress Questionnaire    Feeling of Stress : Not at all  Social Connections: Socially Integrated (12/30/2022)   Social Connection and Isolation Panel [NHANES]    Frequency of Communication with Friends and Family: More than three times a week    Frequency of Social Gatherings with Friends and Family: Once a week    Attends Religious Services: More than 4  times per year    Active Member of [employer] or Organizations: Yes    Attends Banker Meetings: Never    Marital Status: Married    Allergies:  Allergies  Allergen Reactions   Other Rash    Styrofoam - rash and "almost went blind"   Plasticized Base [Plastibase] Rash   Penicillins Rash    Objective:    Vital Signs:   Temp:  [97.6 F (36.4 C)-99 F (37.2 C)] 98.2 F (36.8 C) (02/10 0338) Pulse Rate:  [45-86] 62 (02/10 0338) Resp:  [16-18] 16 (02/10 0338) BP: (103-137)/(58-93) 115/93 (02/10 0338) SpO2:  [94 %-99 %]  99 % (02/10 0338) Weight:  [66.6 kg] 66.6 kg (02/10 0500) Last BM Date : 09/16/23  Weight change: Filed Weights   09/15/23 1255 09/17/23 0510 09/18/23 0500  Weight: 64.4 kg 65 kg 66.6 kg    Intake/Output:   Intake/Output Summary (Last 24 hours) at 09/18/2023 0819 Last data filed at 09/18/2023 0057 Gross per 24 hour  Intake 450 ml  Output 1650 ml  Net -1200 ml      Physical Exam    General:  Elderly No resp difficulty HEENT: normal Neck: supple. JVP . Carotids 2+ bilat; no bruits. No lymphadenopathy or thyromegaly appreciated. Cor: PMI nondisplaced. RIrreg . Lungs: clear Abdomen: soft, nontender, nondistended. No hepatosplenomegaly. No bruits or masses. Good bowel sounds. Extremities: no cyanosis, clubbing, rash, edema Neuro: alert & orientedx3, cranial nerves grossly intact. moves all 4 extremities w/o difficulty. Affect pleasant   Telemetry   AFL 60-70s frequent PVCs 15-24min Personally reviewed   EKG    AFL 63 QRS 62ms  Labs   Basic Metabolic Panel: Recent Labs  Lab 09/15/23 1044 09/15/23 1556 09/16/23 0916 09/17/23 0313 09/18/23 0543  NA 140  --  139 134* 136  K 4.2  --  3.7 3.0* 4.3  CL 107  --  105 100 102  CO2 19*  --  25 25 26   GLUCOSE 104*  --  110* 138* 112*  BUN 12  --  15 16 21   CREATININE 0.71  --  0.79 0.75 0.83  CALCIUM  9.1  --  8.9 8.5* 8.8*  MG  --  2.1  --   --   --   PHOS  --  3.6  --   --   --      Liver Function Tests: Recent Labs  Lab 09/16/23 0916 09/17/23 1101  AST 32 32  ALT 32 36  ALKPHOS 74 69  BILITOT 2.5* 2.8*  PROT 6.5 6.5  ALBUMIN 3.5 3.3*   No results for input(s): "LIPASE", "AMYLASE" in the last 168 hours. No results for input(s): "AMMONIA" in the last 168 hours.  CBC: Recent Labs  Lab 09/15/23 1044 09/17/23 0313 09/18/23 0543  WBC 7.9 10.2 10.1  HGB 12.5* 12.1* 12.2*  HCT 40.1 36.8* 37.2*  MCV 92.8 88.0 89.0  PLT 207 215 223    Cardiac Enzymes: No results for input(s): "CKTOTAL", "CKMB", "CKMBINDEX", "TROPONINI" in the last 168 hours.  BNP: BNP (last 3 results) Recent Labs    09/15/23 1044  BNP 707.9*    ProBNP (last 3 results) No results for input(s): "PROBNP" in the last 8760 hours.   CBG: Recent Labs  Lab 09/15/23 1921  GLUCAP 123*    Coagulation Studies: Recent Labs    09/16/23 1122  LABPROT 16.7*  INR 1.3*     Imaging   No results found.   Medications:     Current Medications:  aspirin  EC  81 mg Oral Daily   atorvastatin   80 mg Oral Daily   furosemide   40 mg Intravenous BID   losartan   25 mg Oral Daily   metoprolol  tartrate  25 mg Oral BID   sodium chloride  flush  3 mL Intravenous Q12H   spironolactone   12.5 mg Oral Daily    Infusions:  heparin  1,100 Units/hr (09/18/23 0657)      Assessment/Plan   1. Acute HFrEF:  - New onset - Echo this admit EF 35-4-% global HK moderate LVH. 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 ? Tachy-mediated (AFL  on admit) vs infiltrative process vs HTN CM vs frequent PVCs. Clinical suspicion high for potential amyloid.  - Volume looks good. Stop IV lasix   - Continue losartan  25 daily - Increase spiro to 25 mg daily - Add SGLT2i - Switch metoprolol  tartrate to Toprol  25 daily - Will need cMRI and R/L cath - send myeloma panel   2. CAD with elevated high-sensitivity troponin:  - Has coronary calcifications on CT. No chest pain -  Initial high-sensitivity troponin 44 with delta troponin of 50 - Plan R/L cath this admit - Continue ASA/statin  3. Atrial flutter with slow VR:  - on heparin  - Plan TEE/DC-CV prior to d/c  - start amio for PVCs. Watch for increasing block  4. Frequent PVCs - will start low-dose amio.    5. Aortic stenosis/mitral regurgitation: - AS is mild to moderate  6. Hypokalemia: -Replete - increase spiro    7. 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 DOAC on d/c - has Fhx of DVT. Hypercoag w/u   8. History of GI bleed secondary to hemorrhoids: -Denies current bleeding -Hgb stable on heparin  gtt -Monitor   Length of Stay: 3  Jules Oar, MD  09/18/2023, 8:19 AM  Advanced Heart Failure Team Pager [phone] (M-F; 7a - 5p)  Please contact [facility] for night-coverage after hours (4p -7a ) and weekends on [email or web address]

## 2023-09-18 NOTE — Care Management Important Message (Signed)
 Important Message  Patient Details  Name: Keith Bates MRN: [id number] Date of Birth: Aug 03, 1941   Important Message Given:  Yes - Medicare IM     Felix Host 09/18/2023, 3:20 PM

## 2023-09-18 NOTE — Telephone Encounter (Signed)
 Pharmacy Patient Advocate Encounter  Insurance verification completed.    The patient is insured through  [employer] . Patient has [employer], may use a copay card, and/or apply for patient assistance if available.    Ran test claim for Xarelto  and the current 30 day co-pay is $175.  Ran test claim for Jardiance and the current 30 day co-pay is $175.  Ran test claim for Farxiga  and the current 30 day co-pay is $175.  Ran test claim for Entresto  and the current 30 day co-pay is $175.  Ran test claim for Eliquis  and the current 30 day co-pay is $175.  This test claim was processed through  [facility]- copay amounts may vary at other pharmacies due to pharmacy/plan contracts, or as the patient moves through the different stages of their insurance plan.

## 2023-09-18 NOTE — Progress Notes (Signed)
 Progress Note   Patient: Keith Bates WGN:[id number] DOB: [birth year] DOA: 09/15/2023     3 DOS: the patient was seen and examined on 09/18/2023   Brief hospital course: Rydell Wender is a 83 y.o. male with medical history significant of internal hemorrhoids, prostate cancer on hormone manipulation, presented with worsening of exertional dyspnea and leg swelling.   Patient is admitted to the hospitalist service for further management evaluation of acute CHF.  Echo showed EF 35-40%, global hypokinesis. Cardiology evaluated him. Patient is found to have right leg DVT and started on heparin  drip as per pharmacy protocol.  Assessment and Plan: Acute systolic CHF  Echo shows EF 35-40%, global hypokinesis. Stopped IV Lasix  40 mg as he diuresed well and is euvolemic. Cardiology follow-up appreciated. Seen by Heart failure team. Continue beta-blocker, ARB, farxiga , aldactone . Daily weights, strict I and Os, monitor urine output. Monitor electrolytes, renal function.   A flutter, rate controlled Continue heparin  drip as per pharmacy protocol. Continue metoprolol  therapy. Started amiodarone  to deceased PVC burden. Heart failure advised cardioversion prior to dc.  Continue telemetry monitoring. Watch for hemorrhoidal bleeding.  Acute right leg DVT: CTA chest negative for PE. Ultrasound shows of extensive right lower extremity DVT. Continue heparin  drip as per pharmacy protocol. Monitor aptt, bleeding risk explained.  Hypokalemia- K improved Stopped oral Kcl supplements. Monitor daily electrolytes on IV diuresis.   CAD with elevated troponin Continue heparin  drip per pharmacy protocol. Cardiology recommended right and left heart cath prior to discharge. He denies chest pain, or shortness of breath.  Hypertension Continue metoprolol , lisinopril .  Hyperlipidemia- Continue atorvastatin .  History of prostate cancer Stable, gets hormonal injection every 6 months Increased bili  (fractionated) noted. Will follow.  Out of bed to chair. Incentive spirometry. Nursing supportive care. Fall, aspiration precautions. DVT prophylaxis   Code Status: Full Code  Subjective: Patient is seen and examined today morning. He feels better, eating fair. Worked with PT. No active bleeding on heparin . He is going for cMRI today.  Physical Exam: Vitals:   09/18/23 0338 09/18/23 0500 09/18/23 0846 09/18/23 1119  BP: (!) 115/93  134/75 (!) 115/58  Pulse: 62   (!) 51  Resp: 16     Temp: 98.2 F (36.8 C)  98.3 F (36.8 C)   TempSrc: Oral  Oral   SpO2: 99%  97% 97%  Weight:  66.6 kg    Height:        General - Elderly African-American in male, no apparent distress HEENT - PERRLA, EOMI, atraumatic head, non tender sinuses. Lung - Clear, basal rales, rhonchi, no wheezes. Heart - S1, S2 heard, no murmurs, rubs, right greater than left pedal edema. Abdomen - Soft, non tender, nondistended, bowel sounds good Neuro - Alert, awake and oriented x 3, non focal exam. Skin - Warm and dry.  Right greater than left lower extremity swelling  Data Reviewed:      Latest Ref Rng & Units 09/18/2023    5:43 AM 09/17/2023    3:13 AM 09/15/2023   10:44 AM  CBC  WBC 4.0 - 10.5 K/uL 10.1  10.2  7.9   Hemoglobin 13.0 - 17.0 g/dL 78.4  69.6  29.5   Hematocrit 39.0 - 52.0 % 37.2  36.8  40.1   Platelets 150 - 400 K/uL 223  215  207       Latest Ref Rng & Units 09/18/2023    5:43 AM 09/17/2023    3:13 AM 09/16/2023    9:16 AM  BMP  Glucose 70 - 99 mg/dL 253  664  403   BUN 8 - 23 mg/dL 21  16  15    Creatinine 0.61 - 1.24 mg/dL 4.74  2.59  5.63   Sodium 135 - 145 mmol/L 136  134  139   Potassium 3.5 - 5.1 mmol/L 4.3  3.0  3.7   Chloride 98 - 111 mmol/L 102  100  105   CO2 22 - 32 mmol/L 26  25  25    Calcium  8.9 - 10.3 mg/dL 8.8  8.5  8.9    US  Abdomen Limited RUQ (LIVER/GB) Result Date: 09/18/2023 CLINICAL DATA:  Elevated LFTs EXAM: ULTRASOUND ABDOMEN LIMITED RIGHT UPPER QUADRANT COMPARISON:   CT from 05/22/2023 FINDINGS: Gallbladder: No gallstones or wall thickening visualized. No sonographic Murphy sign noted by sonographer. Previously seen small gallstone is not well visualized. Common bile duct: Diameter: 1.7 mm. Liver: No focal lesion identified. Within normal limits in parenchymal echogenicity. Portal vein is patent on color Doppler imaging with normal direction of blood flow towards the liver. Other: Note is made of a small right-sided pleural effusion. IMPRESSION: Small right pleural effusion. No definitive evidence of cholelithiasis. Electronically Signed   By: Violeta Grey M.D.   On: 09/18/2023 10:42   Family Communication: Discussed with patient, he understand and agree. All questions answereed.  Disposition: Status is: Inpatient Remains inpatient appropriate because: Right lower extremity DVT, atrial flutter, CHF  Planned Discharge Destination: Home with Home Health     Time spent: 37 minutes  Author: Aisha Hove, MD 09/18/2023 2:42 PM Secure chat 7am to 7pm For on call review [email or web address].

## 2023-09-18 NOTE — Consult Note (Signed)
  PHARMACY - ANTICOAGULATION CONSULT NOTE  Pharmacy Consult for Heparin  infusion Indication: atrial fibrillation  Allergies  Allergen Reactions   Other Rash    Styrofoam - rash and "almost went blind"   Plasticized Base [Plastibase] Rash   Penicillins Rash    Patient Measurements: Height: 5\' 11"  (180.3 cm) Weight: 66.6 kg (146 lb 13.2 oz) IBW/kg (Calculated) : 75.3 Heparin  Dosing Weight: 64.4 kg  Vital Signs: Temp: 98.2 F (36.8 C) (02/10 0338) Temp Source: Oral (02/10 0338) BP: 115/93 (02/10 0338) Pulse Rate: 62 (02/10 0338)  Labs: Recent Labs    09/15/23 1044 09/15/23 1556 09/16/23 0916 09/16/23 1122 09/16/23 1920 09/17/23 0313 09/17/23 1101 09/18/23 0543  HGB 12.5*  --   --   --   --  12.1*  --  12.2*  HCT 40.1  --   --   --   --  36.8*  --  37.2*  PLT 207  --   --   --   --  215  --  223  APTT  --   --   --  25  --   --   --   --   LABPROT  --   --   --  16.7*  --   --   --   --   INR  --   --   --  1.3*  --   --   --   --   HEPARINUNFRC  --   --   --   --    < > 0.49 0.43 0.45  CREATININE 0.71  --  0.79  --   --  0.75  --  0.83  TROPONINIHS 44* 50*  --   --   --   --   --   --    < > = values in this interval not displayed.    Estimated Creatinine Clearance: 64.6 mL/min (by C-G formula based on SCr of 0.83 mg/dL).   Medical History: Past Medical History:  Diagnosis Date   Arthritis    DDD (degenerative disc disease)    Enlarged prostate    Environmental allergies    GERD (gastroesophageal reflux disease)    Hx of ulcer disease    Loss of weight    Nocturia    Prostate cancer (HCC)    Unspecified hemorrhoids without mention of complication     Medications:  No AC noted prior to admission.  Assessment: Patient admitted for acute HF, and increased shortness of breath. PHM includes CAD noted on CT imaging, prediabetes, hyperlipidemia, prostate cancer, rheumatoid arthritis, and osteoarthritis.  Troponin trending upward, ECHO pending, and new  onset Afib noted. Pharmacy consulted to initiate heparin  infusion for Afib. Plan to transition to DOAC prior to discharge.  *Baseline Hgb and plt appropriate to initiate therapy. Baseline aPTT and INR ordered.  2/8   19:20  HL 0.16, SUBtherapeutic running at 900 units/hr 2/9   0313 HL 0.49 therapeutic x 1 0209 1101 HL 0.43 Therapeutic x 2 0210 0543 HL 0.45 Therapeutic x 3  Goal of Therapy:  Heparin  level 0.3-0.7 units/ml Monitor platelets by anticoagulation protocol: Yes   Plan:  -HL Therapeutic x 3 -Continue heparin  drip at 1100 units/hr -check HL with am labs -Daily CBC while on Heparin  drip  Coretta Dexter, PharmD, [facility] 09/18/2023 6:31 AM

## 2023-09-18 NOTE — Plan of Care (Signed)

## 2023-09-19 ENCOUNTER — Encounter
Admission: EM | Disposition: A | Discharge status: Skilled Nursing Facility | Payer: Medicare Other | Source: Ambulatory Visit | Attending: Internal Medicine

## 2023-09-19 DIAGNOSIS — I5021 Acute systolic (congestive) heart failure: Secondary | ICD-10-CM | POA: Diagnosis not present

## 2023-09-19 DIAGNOSIS — I43 Cardiomyopathy in diseases classified elsewhere: Secondary | ICD-10-CM | POA: Diagnosis not present

## 2023-09-19 DIAGNOSIS — R7989 Other specified abnormal findings of blood chemistry: Secondary | ICD-10-CM | POA: Diagnosis not present

## 2023-09-19 DIAGNOSIS — I2510 Atherosclerotic heart disease of native coronary artery without angina pectoris: Secondary | ICD-10-CM | POA: Diagnosis not present

## 2023-09-19 DIAGNOSIS — E854 Organ-limited amyloidosis: Secondary | ICD-10-CM

## 2023-09-19 DIAGNOSIS — I4891 Unspecified atrial fibrillation: Secondary | ICD-10-CM | POA: Diagnosis not present

## 2023-09-19 DIAGNOSIS — I82411 Acute embolism and thrombosis of right femoral vein: Secondary | ICD-10-CM | POA: Diagnosis not present

## 2023-09-19 HISTORY — PX: RIGHT AND LEFT HEART CATH: CATH118262

## 2023-09-19 LAB — CBC
HCT: 38.8 % — ABNORMAL LOW (ref 39.0–52.0)
Hemoglobin: 12.3 g/dL — ABNORMAL LOW (ref 13.0–17.0)
MCH: 28.8 pg (ref 26.0–34.0)
MCHC: 31.7 g/dL (ref 30.0–36.0)
MCV: 90.9 fL (ref 80.0–100.0)
Platelets: 242 10*3/uL (ref 150–400)
RBC: 4.27 MIL/uL (ref 4.22–5.81)
RDW: 17.2 % — ABNORMAL HIGH (ref 11.5–15.5)
WBC: 10.3 10*3/uL (ref 4.0–10.5)
nRBC: 0 % (ref 0.0–0.2)

## 2023-09-19 LAB — BASIC METABOLIC PANEL
Anion gap: 11 (ref 5–15)
BUN: 22 mg/dL (ref 8–23)
CO2: 21 mmol/L — ABNORMAL LOW (ref 22–32)
Calcium: 8.6 mg/dL — ABNORMAL LOW (ref 8.9–10.3)
Chloride: 100 mmol/L (ref 98–111)
Creatinine, Ser: 0.81 mg/dL (ref 0.61–1.24)
GFR, Estimated: >60 mL/min (ref >60)
Glucose, Bld: 119 mg/dL — ABNORMAL HIGH (ref 70–99)
Potassium: 4 mmol/L (ref 3.5–5.1)
Sodium: 132 mmol/L — ABNORMAL LOW (ref 135–145)

## 2023-09-19 LAB — POCT I-STAT EG7
Acid-base deficit: 1 mmol/L (ref 0.0–2.0)
Acid-base deficit: 1 mmol/L (ref 0.0–2.0)
Bicarbonate: 23.7 mmol/L (ref 20.0–28.0)
Bicarbonate: 23.8 mmol/L (ref 20.0–28.0)
Calcium, Ion: 1.23 mmol/L (ref 1.15–1.40)
Calcium, Ion: 1.24 mmol/L (ref 1.15–1.40)
HCT: 39 % (ref 39.0–52.0)
HCT: 40 % (ref 39.0–52.0)
Hemoglobin: 13.3 g/dL (ref 13.0–17.0)
Hemoglobin: 13.6 g/dL (ref 13.0–17.0)
O2 Saturation: 57 %
O2 Saturation: 57 %
Potassium: 4 mmol/L (ref 3.5–5.1)
Potassium: 4.1 mmol/L (ref 3.5–5.1)
Sodium: 136 mmol/L (ref 135–145)
Sodium: 137 mmol/L (ref 135–145)
TCO2: 25 mmol/L (ref 22–32)
TCO2: 25 mmol/L (ref 22–32)
pCO2, Ven: 39.8 mm[Hg] — ABNORMAL LOW (ref 44–60)
pCO2, Ven: 40.4 mm[Hg] — ABNORMAL LOW (ref 44–60)
pH, Ven: 7.377 (ref 7.25–7.43)
pH, Ven: 7.383 (ref 7.25–7.43)
pO2, Ven: 30 mm[Hg] — CL (ref 32–45)
pO2, Ven: 31 mm[Hg] — CL (ref 32–45)

## 2023-09-19 LAB — KAPPA/LAMBDA LIGHT CHAINS
Kappa free light chain: 23.6 mg/L — ABNORMAL HIGH (ref 3.3–19.4)
Kappa, lambda light chain ratio: 1.11 (ref 0.26–1.65)
Lambda free light chains: 21.2 mg/L (ref 5.7–26.3)

## 2023-09-19 LAB — POCT I-STAT 7, (LYTES, BLD GAS, ICA,H+H)
Acid-base deficit: 3 mmol/L — ABNORMAL HIGH (ref 0.0–2.0)
Bicarbonate: 19.5 mmol/L — ABNORMAL LOW (ref 20.0–28.0)
Calcium, Ion: 1.22 mmol/L (ref 1.15–1.40)
HCT: 39 % (ref 39.0–52.0)
Hemoglobin: 13.3 g/dL (ref 13.0–17.0)
O2 Saturation: 96 %
Potassium: 4.1 mmol/L (ref 3.5–5.1)
Sodium: 137 mmol/L (ref 135–145)
TCO2: 20 mmol/L — ABNORMAL LOW (ref 22–32)
pCO2 arterial: 28 mm[Hg] — ABNORMAL LOW (ref 32–48)
pH, Arterial: 7.45 (ref 7.35–7.45)
pO2, Arterial: 77 mm[Hg] — ABNORMAL LOW (ref 83–108)

## 2023-09-19 LAB — HEPARIN LEVEL (UNFRACTIONATED): Heparin Unfractionated: 0.33 [IU]/mL (ref 0.30–0.70)

## 2023-09-19 SURGERY — RIGHT AND LEFT HEART CATH
Anesthesia: Moderate Sedation

## 2023-09-19 MED ORDER — HEPARIN SODIUM (PORCINE) 1000 UNIT/ML IJ SOLN
INTRAMUSCULAR | Status: AC
  Filled 2023-09-19: qty 10

## 2023-09-19 MED ORDER — FENTANYL CITRATE (PF) 100 MCG/2ML IJ SOLN
INTRAMUSCULAR | Status: AC
  Filled 2023-09-19: qty 2

## 2023-09-19 MED ORDER — LIDOCAINE HCL 1 % IJ SOLN
INTRAMUSCULAR | Status: AC
  Filled 2023-09-19: qty 20

## 2023-09-19 MED ORDER — SODIUM CHLORIDE 0.9 % IV SOLN: INTRAVENOUS | Status: DC

## 2023-09-19 MED ORDER — HEPARIN SODIUM (PORCINE) 1000 UNIT/ML IJ SOLN
INTRAMUSCULAR | Status: DC | PRN
  Administered 2023-09-19: 3000 [IU] via INTRAVENOUS

## 2023-09-19 MED ORDER — HEPARIN (PORCINE) IN NACL 1000-0.9 UT/500ML-% IV SOLN
INTRAVENOUS | Status: AC
  Filled 2023-09-19: qty 1000

## 2023-09-19 MED ORDER — HEPARIN SODIUM (PORCINE) 1000 UNIT/ML IJ SOLN
INTRAMUSCULAR | Status: AC
Start: 2023-09-19 — End: ?
  Filled 2023-09-19: qty 10

## 2023-09-19 MED ORDER — HEPARIN (PORCINE) IN NACL 1000-0.9 UT/500ML-% IV SOLN: INTRAVENOUS | Status: DC | PRN

## 2023-09-19 MED ORDER — ASPIRIN 81 MG PO CHEW: 81.0000 mg | CHEWABLE_TABLET | ORAL | Status: DC

## 2023-09-19 MED ORDER — SODIUM CHLORIDE 0.9 % IV SOLN: 250.0000 mL | INTRAVENOUS | Status: AC | PRN

## 2023-09-19 MED ORDER — HEPARIN (PORCINE) IN NACL 2000-0.9 UNIT/L-% IV SOLN
INTRAVENOUS | Status: DC | PRN
  Administered 2023-09-19: 1000 mL

## 2023-09-19 MED ORDER — FENTANYL CITRATE (PF) 100 MCG/2ML IJ SOLN
INTRAMUSCULAR | Status: AC
Start: 2023-09-19 — End: ?
  Filled 2023-09-19: qty 2

## 2023-09-19 MED ORDER — MIDAZOLAM HCL 2 MG/2ML IJ SOLN
INTRAMUSCULAR | Status: DC | PRN
  Administered 2023-09-19: 1 mg via INTRAVENOUS

## 2023-09-19 MED ORDER — ONDANSETRON HCL 4 MG/2ML IJ SOLN: 4.0000 mg | Freq: Four times a day (QID) | INTRAMUSCULAR | Status: DC | PRN

## 2023-09-19 MED ORDER — IOHEXOL 300 MG/ML  SOLN
INTRAMUSCULAR | Status: DC | PRN
  Administered 2023-09-19: 43 mL

## 2023-09-19 MED ORDER — MIDAZOLAM HCL 2 MG/2ML IJ SOLN
INTRAMUSCULAR | Status: AC
  Filled 2023-09-19: qty 2

## 2023-09-19 MED ORDER — ACETAMINOPHEN 325 MG PO TABS
650.0000 mg | ORAL_TABLET | ORAL | Status: DC | PRN
  Administered 2023-09-20: 650 mg via ORAL

## 2023-09-19 MED ORDER — SODIUM CHLORIDE 0.9% FLUSH: 3.0000 mL | INTRAVENOUS | Status: DC | PRN

## 2023-09-19 MED ORDER — LIDOCAINE HCL 1 % IJ SOLN
INTRAMUSCULAR | Status: AC
Start: 2023-09-19 — End: ?
  Filled 2023-09-19: qty 20

## 2023-09-19 MED ORDER — VERAPAMIL HCL 2.5 MG/ML IV SOLN
INTRAVENOUS | Status: DC | PRN
  Administered 2023-09-19: 2.5 mg via INTRA_ARTERIAL

## 2023-09-19 MED ORDER — HEPARIN (PORCINE) 25000 UT/250ML-% IV SOLN
1250.0000 [IU]/h | INTRAVENOUS | Status: DC
  Administered 2023-09-19: 1100 [IU]/h via INTRAVENOUS

## 2023-09-19 MED ORDER — VERAPAMIL HCL 2.5 MG/ML IV SOLN
INTRAVENOUS | Status: AC
  Filled 2023-09-19: qty 2

## 2023-09-19 MED ORDER — SODIUM CHLORIDE 0.9% FLUSH
3.0000 mL | Freq: Two times a day (BID) | INTRAVENOUS | Status: DC
  Administered 2023-09-19 – 2023-09-22 (×6): 3 mL via INTRAVENOUS

## 2023-09-19 MED ORDER — SACUBITRIL-VALSARTAN 24-26 MG PO TABS
1.0000 | ORAL_TABLET | Freq: Two times a day (BID) | ORAL | Status: DC
  Administered 2023-09-19 – 2023-09-22 (×6): 1 via ORAL
  Filled 2023-09-19 (×6): qty 1

## 2023-09-19 MED ORDER — LIDOCAINE HCL (PF) 1 % IJ SOLN
INTRAMUSCULAR | Status: DC | PRN
  Administered 2023-09-19: 2 mL

## 2023-09-19 MED ORDER — FENTANYL CITRATE (PF) 100 MCG/2ML IJ SOLN
INTRAMUSCULAR | Status: DC | PRN
  Administered 2023-09-19 (×2): 25 ug via INTRAVENOUS

## 2023-09-19 SURGICAL SUPPLY — 13 items
CATH 5FR JL3.5 JR4 ANG PIG MP (CATHETERS) IMPLANT
CATH BALLN WEDGE 5F 110CM (CATHETERS) IMPLANT
CATH SWAN GANZ VIP 7.5F (CATHETERS) IMPLANT
DEVICE RAD TR BAND REGULAR (VASCULAR PRODUCTS) IMPLANT
DRAPE BRACHIAL (DRAPES) IMPLANT
GLIDESHEATH SLEND SS 6F .021 (SHEATH) IMPLANT
GUIDEWIRE INQWIRE 1.5J.035X260 (WIRE) IMPLANT
INQWIRE 1.5J .035X260CM (WIRE) ×1 IMPLANT
PACK CARDIAC CATH (CUSTOM PROCEDURE TRAY) ×1 IMPLANT
PROTECTION STATION PRESSURIZED (MISCELLANEOUS) ×1 IMPLANT
SET ATX-X65L (MISCELLANEOUS) IMPLANT
SHEATH GLIDE SLENDER 4/5FR (SHEATH) IMPLANT
STATION PROTECTION PRESSURIZED (MISCELLANEOUS) IMPLANT

## 2023-09-19 NOTE — H&P (View-Only) (Signed)
Family updated on procedure time change. Voiced understanding.    Brynda Peon, AGACNP-BC  09/19/23 2:35 PM

## 2023-09-19 NOTE — Progress Notes (Signed)
Family updated on procedure time change. Voiced understanding.    Brynda Peon, AGACNP-BC  09/19/23 2:35 PM

## 2023-09-19 NOTE — Progress Notes (Signed)
PT Cancellation Note  Patient Details Name: Keith Bates MRN: [id number] DOB: [birth year]   Cancelled Treatment:    Reason Eval/Treat Not Completed: Patient at procedure or test/unavailable.  Chart reviewed and attempted to see pt.  Upon arrival and starting treatment session, pt was notified by nursing that he was being taken to surgical procedure.  Pt will be seen at later date/time as medically appropriate.     Nolon Bussing, PT, DPT Physical Therapist - [facility]  09/19/23, 12:42 PM

## 2023-09-19 NOTE — Progress Notes (Signed)
Progress Note   Patient: Keith Bates ZOX:[id number] DOB: 1941/01/24 DOA: 09/15/2023     4 DOS: the patient was seen and examined on 09/19/2023   Brief hospital course: Keith Bates is a 83 y.o. male with medical history significant of internal hemorrhoids, prostate cancer on hormone manipulation, presented with worsening of exertional dyspnea and leg swelling.   Patient is admitted to the hospitalist service for further management evaluation of acute CHF.  Echo showed EF 35-40%, global hypokinesis. Cardiology evaluated him. Patient is found to have right leg DVT and started on heparin drip as per pharmacy protocol.  Patient is started on metoprolol, amiodarone therapy.  Cardiac MRI showed EF 33%, diffuse endocardial gadolinium enhancement possible amyloid.  Heart failure team advised right and left heart cath, possible cardioversion prior to discharge.  Assessment and Plan: Acute systolic CHF  Echo shows EF 35-40%, global hypokinesis. Stopped IV Lasix 40 mg as he diuresed well and is euvolemic. Cardiology follow-up appreciated. Seen by Heart failure team. cMRI showed LVEF 33%, diffuse subendocardial gadolinium enhancement, ? Amyloid.  Plan for heart cath today Continue beta-blocker, ARB, farxiga, aldactone. Daily weights, strict I and Os, monitor urine output. Monitor electrolytes, renal function.   A flutter, rate controlled Continue heparin drip as per pharmacy protocol. Continue metoprolol therapy. He is on amiodarone 200 daily to decease PVC burden. Heart failure advised R/L heart cath and cardioversion prior to dc.  Continue telemetry monitoring. Watch for hemorrhoidal bleeding.  Acute right leg DVT: CTA chest negative for PE. Ultrasound shows of extensive right lower extremity DVT. Continue heparin drip as per pharmacy protocol. Monitor aptt, bleeding risk explained.  Hypokalemia- K improved. Stopped oral Kcl supplements. Monitor daily electrolytes.   CAD with elevated  troponin Continue heparin drip per pharmacy protocol. Cardiology plan to do right and left heart cath today. I discussed with patient's daughter, son at bedside regarding the current work up and plan for heart cath. Family upset about miscommunication as [name] seems forgetful. I advised family to let nurse know of any questions or concerns so MD can talk and clarify.  Cardiology team at bedside discussed in detail about the procedure. Consent signed for cath procedure.  Hypertension Continue metoprolol, lisinopril.  Hyperlipidemia- Continue atorvastatin.  History of prostate cancer Stable, gets hormonal injection every 6 months Increased bili (fractionated) noted. Will follow.  Out of bed to chair. Incentive spirometry. Nursing supportive care. Fall, aspiration precautions. DVT prophylaxis   Code Status: Full Code  Subjective: Patient is seen and examined today morning. He denies any complaints. He is accompanied by son and daughter at bedside. They have questions about the work up and results. He is calm during conversation. Says he forgot any discussion made prior by cardiology team.  Physical Exam: Vitals:   09/19/23 0013 09/19/23 0537 09/19/23 0559 09/19/23 0741  BP: 125/68 123/74  129/81  Pulse: (!) 104 90  (!) 58  Resp: 18 18    Temp: 98.4 F (36.9 C) 98.2 F (36.8 C)  98.4 F (36.9 C)  TempSrc:      SpO2: 98% (!) 61%  95%  Weight:   64 kg   Height:        General - Elderly African-American in male, no apparent distress HEENT - PERRLA, EOMI, atraumatic head, non tender sinuses. Lung - Clear, basal rales, rhonchi, no wheezes. Heart - S1, S2 heard, no murmurs, rubs, right greater than left pedal edema. Abdomen - Soft, non tender, nondistended, bowel sounds good Neuro - Alert,  awake and oriented x 3, non focal exam. Skin - Warm and dry.  Right greater than left lower extremity swelling  Data Reviewed:      Latest Ref Rng & Units 09/19/2023    1:57 AM  09/18/2023    5:43 AM 09/17/2023    3:13 AM  CBC  WBC 4.0 - 10.5 K/uL 10.3  10.1  10.2   Hemoglobin 13.0 - 17.0 g/dL 54.0  98.1  19.1   Hematocrit 39.0 - 52.0 % 38.8  37.2  36.8   Platelets 150 - 400 K/uL 242  223  215       Latest Ref Rng & Units 09/19/2023    1:57 AM 09/18/2023    5:43 AM 09/17/2023    3:13 AM  BMP  Glucose 70 - 99 mg/dL 478  295  621   BUN 8 - 23 mg/dL 22  21  16    Creatinine 0.61 - 1.24 mg/dL 3.08  6.57  8.46   Sodium 135 - 145 mmol/L 132  136  134   Potassium 3.5 - 5.1 mmol/L 4.0  4.3  3.0   Chloride 98 - 111 mmol/L 100  102  100   CO2 22 - 32 mmol/L 21  26  25    Calcium 8.9 - 10.3 mg/dL 8.6  8.8  8.5    MR CARDIAC MORPHOLOGY W WO CONTRAST Result Date: 09/18/2023 CLINICAL DATA:  Cardiomyopathy, atrial flutter, eval for infiltrative disease EXAM: CARDIAC MRI TECHNIQUE: The patient was scanned on a 1.5 Tesla Siemens magnet. A dedicated cardiac coil was used. Functional imaging was done using Fiesta sequences. 2,3, and 4 chamber views were done to assess for RWMA's. Modified Simpson's rule using a short axis stack was used to calculate an ejection fraction on a dedicated work [employer] officer, [employer]. The patient received 9 cc of Gadavist. After 10 minutes inversion recovery sequences were used to assess for infiltration and scar tissue. Velocity flow mapping performed in the ascending aorta and main pulmonary artery. CONTRAST:  9 cc  of Gadavist FINDINGS: 1. Normal left ventricular size, mild-moderate LV thickness, 1.4 cm in diameter, moderately reduced LV systolic function (LVEF = 33%). There are no regional wall motion abnormalities. There is diffuse subendocardial late gadolinium enhancement in the left ventricular myocardium. LVEDV: 144 ml LVESV: 97 ml SV: 47 ml CO: 3.1 L/min Myocardial mass: 154g Native LV T1 value 1034 ms (normal <1000 ms) LV ECV 33% (normal <30%) 2. Moderately dilated right ventricular size, normal thickness and systolic function there are no  regional wall motion abnormalities. 3.  Moderately dilated left and right atrial size. 4. Normal size of the aortic root, ascending aorta and pulmonary artery. 5. Aortic valve is Trileaflet, mild stenosis per planimetry. Mild tricuspid regurgitation. 6.  Normal pericardium.  No pericardial effusion. IMPRESSION: 1.  Moderately reduced LV systolic function.  LVEF 33%. 2.  Diffuse subendocardial LGE, with no specific vascular territory. 3.  Moderately dilated left and right atrial size. 4.  Moderately dilated RV size, normal RV function. 5.  Consider infiltrative disease such as amyloid. Electronically Signed   By: Debbe Odea M.D.   On: 09/18/2023 17:23   MR CARDIAC VELOCITY FLOW MAP Result Date: 09/18/2023 CLINICAL DATA:  Cardiomyopathy, atrial flutter, eval for infiltrative disease EXAM: CARDIAC MRI TECHNIQUE: The patient was scanned on a 1.5 Tesla Siemens magnet. A dedicated cardiac coil was used. Functional imaging was done using Fiesta sequences. 2,3, and 4 chamber views were done to assess for RWMA's.  Modified Simpson's rule using a short axis stack was used to calculate an ejection fraction on a dedicated work [employer] officer, [employer]. The patient received 9 cc of Gadavist. After 10 minutes inversion recovery sequences were used to assess for infiltration and scar tissue. Velocity flow mapping performed in the ascending aorta and main pulmonary artery. CONTRAST:  9 cc  of Gadavist FINDINGS: 1. Normal left ventricular size, mild-moderate LV thickness, 1.4 cm in diameter, moderately reduced LV systolic function (LVEF = 33%). There are no regional wall motion abnormalities. There is diffuse subendocardial late gadolinium enhancement in the left ventricular myocardium. LVEDV: 144 ml LVESV: 97 ml SV: 47 ml CO: 3.1 L/min Myocardial mass: 154g Native LV T1 value 1034 ms (normal <1000 ms) LV ECV 33% (normal <30%) 2. Moderately dilated right ventricular size, normal thickness and systolic function there  are no regional wall motion abnormalities. 3.  Moderately dilated left and right atrial size. 4. Normal size of the aortic root, ascending aorta and pulmonary artery. 5. Aortic valve is Trileaflet, mild stenosis per planimetry. Mild tricuspid regurgitation. 6.  Normal pericardium.  No pericardial effusion. IMPRESSION: 1.  Moderately reduced LV systolic function.  LVEF 33%. 2.  Diffuse subendocardial LGE, with no specific vascular territory. 3.  Moderately dilated left and right atrial size. 4.  Moderately dilated RV size, normal RV function. 5.  Consider infiltrative disease such as amyloid. Electronically Signed   By: Debbe Odea M.D.   On: 09/18/2023 17:23   US Abdomen Limited RUQ (LIVER/GB) Result Date: 09/18/2023 CLINICAL DATA:  Elevated LFTs EXAM: ULTRASOUND ABDOMEN LIMITED RIGHT UPPER QUADRANT COMPARISON:  CT from 05/22/2023 FINDINGS: Gallbladder: No gallstones or wall thickening visualized. No sonographic Murphy sign noted by sonographer. Previously seen small gallstone is not well visualized. Common bile duct: Diameter: 1.7 mm. Liver: No focal lesion identified. Within normal limits in parenchymal echogenicity. Portal vein is patent on color Doppler imaging with normal direction of blood flow towards the liver. Other: Note is made of a small right-sided pleural effusion. IMPRESSION: Small right pleural effusion. No definitive evidence of cholelithiasis. Electronically Signed   By: Alcide Clever M.D.   On: 09/18/2023 10:42   Family Communication: Discussed with patient, family at bedside he understand and agree.  Please call wife, daughter or son regarding test results or update in care plan.  Disposition: Status is: Inpatient Remains inpatient appropriate because: Right lower extremity DVT, atrial flutter, new CHF, plan for cath.  Planned Discharge Destination: Home with Home Health     Time spent: 39 minutes  Author: Marcelino Duster, MD 09/19/2023 12:14 PM Secure chat 7am to  7pm For on call review [email or web address].

## 2023-09-19 NOTE — Progress Notes (Addendum)
Advanced Heart Failure Rounding Note  Cardiologist: None  Chief Complaint:  Subjective:    Tolerating GDMT. Feels ok. Has not gotten up much. Denies CP/SOB.  cMRI yesterday with LVEF 33%, diffuse subendocardial LGE, mod dilated LA/RA, mod dilated RV, normal RV function. Consider amyloid.   NPO for heart cath later today.   Objective:   Weight Range: 64 kg Body mass index is 19.68 kg/m.   Vital Signs:   Temp:  [98.2 F (36.8 C)-100 F (37.8 C)] 98.4 F (36.9 C) (02/11 0741) Pulse Rate:  [38-104] 58 (02/11 0741) Resp:  [18] 18 (02/11 0537) BP: (105-129)/(58-81) 129/81 (02/11 0741) SpO2:  [61 %-99 %] 95 % (02/11 0741) Weight:  [64 kg] 64 kg (02/11 0559) Last BM Date : 09/17/23  Weight change: Filed Weights   09/17/23 0510 09/18/23 0500 09/19/23 0559  Weight: 65 kg 66.6 kg 64 kg    Intake/Output:   Intake/Output Summary (Last 24 hours) at 09/19/2023 0921 Last data filed at 09/19/2023 0559 Gross per 24 hour  Intake 240 ml  Output 1750 ml  Net -1510 ml      Physical Exam    General:  elderly appearing.  No respiratory difficulty HEENT: normal Neck: supple. JVD ~6 cm. Carotids 2+ bilat; no bruits. No lymphadenopathy or thyromegaly appreciated. Cor: PMI nondisplaced. Regular rate & irregular rhythm. No rubs, gallops or murmurs. Lungs: clear Abdomen: soft, nontender, nondistended. No hepatosplenomegaly. No bruits or masses. Good bowel sounds. Extremities: no cyanosis, clubbing, rash, edema  Neuro: alert & oriented x 3, cranial nerves grossly intact. moves all 4 extremities w/o difficulty. Affect pleasant.   Telemetry   Atrial flutter with PVCs 70s (Personally reviewed)    EKG    No new EKG to review  Labs    CBC Recent Labs    09/18/23 0543 09/19/23 0157  WBC 10.1 10.3  HGB 12.2* 12.3*  HCT 37.2* 38.8*  MCV 89.0 90.9  PLT 223 242   Basic Metabolic Panel Recent Labs    29/56/21 0543 09/19/23 0157  NA 136 132*  K 4.3 4.0  CL 102 100  CO2  26 21*  GLUCOSE 112* 119*  BUN 21 22  CREATININE 0.83 0.81  CALCIUM 8.8* 8.6*   Liver Function Tests Recent Labs    09/17/23 1101 09/18/23 1536  AST 32  --   ALT 36  --   ALKPHOS 69  --   BILITOT 2.8* 1.3*  PROT 6.5  --   ALBUMIN 3.3*  --    No results for input(s): "LIPASE", "AMYLASE" in the last 72 hours. Cardiac Enzymes No results for input(s): "CKTOTAL", "CKMB", "CKMBINDEX", "TROPONINI" in the last 72 hours.  BNP: BNP (last 3 results) Recent Labs    09/15/23 1044  BNP 707.9*    ProBNP (last 3 results) No results for input(s): "PROBNP" in the last 8760 hours.   D-Dimer No results for input(s): "DDIMER" in the last 72 hours. Hemoglobin A1C No results for input(s): "HGBA1C" in the last 72 hours. Fasting Lipid Panel No results for input(s): "CHOL", "HDL", "LDLCALC", "TRIG", "CHOLHDL", "LDLDIRECT" in the last 72 hours. Thyroid Function Tests No results for input(s): "TSH", "T4TOTAL", "T3FREE", "THYROIDAB" in the last 72 hours.  Invalid input(s): "FREET3"  Other results:   Imaging    MR CARDIAC MORPHOLOGY W WO CONTRAST Result Date: 09/18/2023 CLINICAL DATA:  Cardiomyopathy, atrial flutter, eval for infiltrative disease EXAM: CARDIAC MRI TECHNIQUE: The patient was scanned on a 1.5 Tesla Siemens magnet. A dedicated  cardiac coil was used. Functional imaging was done using Fiesta sequences. 2,3, and 4 chamber views were done to assess for RWMA's. Modified Simpson's rule using a short axis stack was used to calculate an ejection fraction on a dedicated work [employer] officer, [employer]. The patient received 9 cc of Gadavist. After 10 minutes inversion recovery sequences were used to assess for infiltration and scar tissue. Velocity flow mapping performed in the ascending aorta and main pulmonary artery. CONTRAST:  9 cc  of Gadavist FINDINGS: 1. Normal left ventricular size, mild-moderate LV thickness, 1.4 cm in diameter, moderately reduced LV systolic function (LVEF =  33%). There are no regional wall motion abnormalities. There is diffuse subendocardial late gadolinium enhancement in the left ventricular myocardium. LVEDV: 144 ml LVESV: 97 ml SV: 47 ml CO: 3.1 L/min Myocardial mass: 154g Native LV T1 value 1034 ms (normal <1000 ms) LV ECV 33% (normal <30%) 2. Moderately dilated right ventricular size, normal thickness and systolic function there are no regional wall motion abnormalities. 3.  Moderately dilated left and right atrial size. 4. Normal size of the aortic root, ascending aorta and pulmonary artery. 5. Aortic valve is Trileaflet, mild stenosis per planimetry. Mild tricuspid regurgitation. 6.  Normal pericardium.  No pericardial effusion. IMPRESSION: 1.  Moderately reduced LV systolic function.  LVEF 33%. 2.  Diffuse subendocardial LGE, with no specific vascular territory. 3.  Moderately dilated left and right atrial size. 4.  Moderately dilated RV size, normal RV function. 5.  Consider infiltrative disease such as amyloid. Electronically Signed   By: Debbe Odea M.D.   On: 09/18/2023 17:23   MR CARDIAC VELOCITY FLOW MAP Result Date: 09/18/2023 CLINICAL DATA:  Cardiomyopathy, atrial flutter, eval for infiltrative disease EXAM: CARDIAC MRI TECHNIQUE: The patient was scanned on a 1.5 Tesla Siemens magnet. A dedicated cardiac coil was used. Functional imaging was done using Fiesta sequences. 2,3, and 4 chamber views were done to assess for RWMA's. Modified Simpson's rule using a short axis stack was used to calculate an ejection fraction on a dedicated work [employer] officer, trade [employer]. The patient received 9 cc of Gadavist. After 10 minutes inversion recovery sequences were used to assess for infiltration and scar tissue. Velocity flow mapping performed in the ascending aorta and main pulmonary artery. CONTRAST:  9 cc  of Gadavist FINDINGS: 1. Normal left ventricular size, mild-moderate LV thickness, 1.4 cm in diameter, moderately reduced LV systolic function  (LVEF = 33%). There are no regional wall motion abnormalities. There is diffuse subendocardial late gadolinium enhancement in the left ventricular myocardium. LVEDV: 144 ml LVESV: 97 ml SV: 47 ml CO: 3.1 L/min Myocardial mass: 154g Native LV T1 value 1034 ms (normal <1000 ms) LV ECV 33% (normal <30%) 2. Moderately dilated right ventricular size, normal thickness and systolic function there are no regional wall motion abnormalities. 3.  Moderately dilated left and right atrial size. 4. Normal size of the aortic root, ascending aorta and pulmonary artery. 5. Aortic valve is Trileaflet, mild stenosis per planimetry. Mild tricuspid regurgitation. 6.  Normal pericardium.  No pericardial effusion. IMPRESSION: 1.  Moderately reduced LV systolic function.  LVEF 33%. 2.  Diffuse subendocardial LGE, with no specific vascular territory. 3.  Moderately dilated left and right atrial size. 4.  Moderately dilated RV size, normal RV function. 5.  Consider infiltrative disease such as amyloid. Electronically Signed   By: Debbe Odea M.D.   On: 09/18/2023 17:23     Medications:     Scheduled Medications:  amiodarone  200 mg Oral Daily   [START ON 09/20/2023] aspirin  81 mg Oral Pre-Cath   aspirin EC  81 mg Oral Daily   atorvastatin  80 mg Oral Daily   dapagliflozin propanediol  10 mg Oral Daily   losartan  25 mg Oral Daily   metoprolol succinate  25 mg Oral Daily   sodium chloride flush  3 mL Intravenous Q12H   spironolactone  25 mg Oral Daily    Infusions:  [START ON 09/20/2023] sodium chloride     heparin 1,100 Units/hr (09/19/23 0855)    PRN Medications: acetaminophen, HYDROcodone-acetaminophen, hydrocortisone cream, ondansetron (ZOFRAN) IV, sodium chloride flush    Patient Profile   Keith Bates is a 83 y.o. AAM with arthritis and prostate CA on hormonal therapy. He has no previous cardiac history. Admitted with new onset CHF and concerns for cardiac amyloid.   Assessment/Plan  1. Acute  HFrEF:  - New onset - Echo this admit EF 35-40% global HK moderate LVH. 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 ? Tachy-mediated (AFL on admit) vs infiltrative process vs HTN CM vs frequent PVCs. Clinical suspicion high for potential amyloid.  - cMRI yesterday with LVEF 33%, diffuse subendocardial LGE, mod dilated LA/RA, mod dilated RV, normal RV function. Consider amyloid.  - Volume looks good. Does not currently need a diuretic. Plan to reassess post cath.  - Will give morning dose of losartan 25 daily and start Entresto 24-26 mg BID this evening.  - Continue spiro 25 mg daily - Continue farxiga 10 mg daily - Continue Toprol XL 25 daily - R/L cath ordered. Currently NPO for cath later today - MMP pending  2. CAD with elevated high-sensitivity troponin:  - Has coronary calcifications on CT. No chest pain - Initial high-sensitivity troponin 44 with delta troponin of 50 - Plan R/L cath today - Continue ASA/statin  3. Atrial flutter with slow VR:  - on heparin - Plan TEE/DC-CV prior to d/c  - Continue amiodarone 200 mg daily. Watch for increasing block   4. Frequent PVCs - Continue amiodarone   5. Aortic stenosis/mitral regurgitation: - AS is mild to moderate   6. Hypokalemia: - resolved - Continue spiro    7. 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 DOAC on d/c - has Fhx of DVT. Hypercoag w/u   8. History of GI bleed secondary to hemorrhoids: -Denies current bleeding -Hgb stable on heparin gtt -Monitor   Continue working with PT/OT  Length of Stay: 4  Alen Bleacher, NP  09/19/2023, 9:21 AM  Advanced Heart Failure Team Pager [phone] (M-F; 7a - 5p)  Please contact [facility] for night-coverage after hours (5p -7a ) and weekends on [email or web address]

## 2023-09-19 NOTE — Consult Note (Signed)
  PHARMACY - ANTICOAGULATION CONSULT NOTE  Pharmacy Consult for Heparin infusion Indication: atrial fibrillation  Allergies  Allergen Reactions   Other Rash    Styrofoam - rash and "almost went blind"   Plasticized Base [Plastibase] Rash   Penicillins Rash    Patient Measurements: Height: 5\' 11"  (180.3 cm) Weight: 66.6 kg (146 lb 13.2 oz) IBW/kg (Calculated) : 75.3 Heparin Dosing Weight: 64.4 kg  Vital Signs: Temp: 98.4 F (36.9 C) (02/11 0013) Temp Source: Oral (02/10 1952) BP: 125/68 (02/11 0013) Pulse Rate: 104 (02/11 0013)  Labs: Recent Labs    09/16/23 1122 09/16/23 1920 09/17/23 0313 09/17/23 1101 09/18/23 0543 09/19/23 0157  HGB  --    < > 12.1*  --  12.2* 12.3*  HCT  --   --  36.8*  --  37.2* 38.8*  PLT  --   --  215  --  223 242  APTT 25  --   --   --   --   --   LABPROT 16.7*  --   --   --   --   --   INR 1.3*  --   --   --   --   --   HEPARINUNFRC  --    < > 0.49 0.43 0.45 0.33  CREATININE  --   --  0.75  --  0.83 0.81   < > = values in this interval not displayed.    Estimated Creatinine Clearance: 66.2 mL/min (by C-G formula based on SCr of 0.81 mg/dL).   Medical History: Past Medical History:  Diagnosis Date   Arthritis    DDD (degenerative disc disease)    Enlarged prostate    Environmental allergies    GERD (gastroesophageal reflux disease)    Hx of ulcer disease    Loss of weight    Nocturia    Prostate cancer (HCC)    Unspecified hemorrhoids without mention of complication     Medications:  No AC noted prior to admission.  Assessment: Patient admitted for acute HF, and increased shortness of breath. PHM includes CAD noted on CT imaging, prediabetes, hyperlipidemia, prostate cancer, rheumatoid arthritis, and osteoarthritis.  Troponin trending upward, ECHO pending, and new onset Afib noted. Pharmacy consulted to initiate heparin infusion for Afib. Plan to transition to DOAC prior to discharge.  *Baseline Hgb and plt appropriate to  initiate therapy. Baseline aPTT and INR ordered.  02/08 1920 HL 0.16 SUBtherapeutic running at 900 units/hr 02/09 0313 HL 0.49 Therapeutic x 1 02/09 1101 HL 0.43 Therapeutic x 2 02/10 0543 HL 0.45 Therapeutic x 3 02/11 0157 HL 0.33 Therapeutic x 4   Goal of Therapy:  Heparin level 0.3-0.7 units/ml Monitor platelets by anticoagulation protocol: Yes   Plan:  -HL Therapeutic x 4 -Continue heparin drip at 1100 units/hr -check HL with am labs -Daily CBC while on Heparin drip  Bettey Costa, PharmD Clinical Pharmacist 09/19/2023 3:57 AM

## 2023-09-19 NOTE — Interval H&P Note (Signed)
History and Physical Interval Note:  09/19/2023 6:01 PM  Keith Bates  has presented today for surgery, with the diagnosis of heart failure.  The various methods of treatment have been discussed with the patient and family. After consideration of risks, benefits and other options for treatment, the patient has consented to  Procedure(s): RIGHT AND LEFT HEART CATH (N/A) as a surgical intervention.  The patient's history has been reviewed, patient examined, no change in status, stable for surgery.  I have reviewed the patient's chart and labs.  Questions were answered to the patient's satisfaction.     Arrin Pintor

## 2023-09-19 NOTE — NC FL2 (Signed)
 [place] MEDICAID FL2 LEVEL OF CARE FORM     IDENTIFICATION  Patient Name: Keith Bates Birthdate: [birth year] Sex: male Admission Date (Current Location): 09/15/2023  [facility] and [place] Number:  Chiropodist and Address:  [facility], [address], [place], [place] [address]      Provider Number: [id number]  Attending Physician Name and Address:  Marcelino Duster, MD  Relative Name and Phone Number:       Current Level of Care: Hospital Recommended Level of Care: Skilled Nursing Facility Prior Approval Number:    Date Approved/Denied:   PASRR Number: [id number]  Discharge Plan: SNF    Current Diagnoses: Patient Active Problem List   Diagnosis Date Noted   Atrial flutter (HCC) 09/17/2023   Coronary artery calcification seen on CAT scan 09/17/2023   Elevated troponin 09/17/2023   Acute systolic CHF (congestive heart failure) (HCC) 09/17/2023   Dilated cardiomyopathy (HCC) 09/17/2023   Right leg DVT (HCC) 09/16/2023   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) 09/15/2023   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   CHF (congestive heart failure) (HCC) 09/15/2023   Sebaceous cyst 10/18/2017   Hyperlipidemia 04/20/2017   Primary osteoarthritis involving multiple joints 01/12/2017   Rheumatoid arthritis involving right wrist (HCC) 01/12/2017   Pre-diabetes 01/12/2017   Normocytic anemia 07/29/2013   Abnormal EKG 06/06/2013   Preop cardiovascular exam 06/06/2013   History of prostate cancer 05/16/2013   Internal hemorrhoids 03/07/2013    Orientation RESPIRATION BLADDER Height & Weight     Self, Time, Situation, Place  Normal Continent Weight: 141 lb 1.5 oz (64 kg) Height:  5\' 11"  (180.3 cm)  BEHAVIORAL SYMPTOMS/MOOD NEUROLOGICAL BOWEL NUTRITION STATUS   (None)  (None) Continent Diet (Follow for discharge recommendations. Currently NPO for preocedure.)  AMBULATORY STATUS COMMUNICATION OF NEEDS Skin    Limited Assist Verbally Normal (None)                       Personal Care Assistance Level of Assistance  Bathing, Feeding, Dressing Bathing Assistance: Limited assistance Feeding assistance: Independent Dressing Assistance: Limited assistance     Functional Limitations Info  Sight, Hearing, Speech Sight Info: Adequate Hearing Info: Adequate Speech Info: Adequate    SPECIAL CARE FACTORS FREQUENCY  PT (By licensed PT), OT (By licensed OT)     PT Frequency: 5 x week OT Frequency: 5 x week            Contractures Contractures Info: Not present    Additional Factors Info  Code Status, Allergies Code Status Info: Full code Allergies Info: Styrofoam, Plasticized base (Plastibase), Penicillins           Current Medications (09/19/2023):  This is the current hospital active medication list Current Facility-Administered Medications  Medication Dose Route Frequency Provider Last Rate Last Admin   [START ON 09/20/2023] 0.9 %  sodium chloride infusion   Intravenous Continuous Brynda Peon L, NP 10 mL/hr at 09/19/23 1248 New Bag at 09/19/23 1248   [MAR Hold] acetaminophen (TYLENOL) tablet 650 mg  650 mg Oral Q4H PRN Emeline General, MD       [MAR Hold] amiodarone (PACERONE) tablet 200 mg  200 mg Oral Daily Bensimhon, Bevelyn Buckles, MD   200 mg at 09/19/23 0848   [START ON 09/20/2023] aspirin chewable tablet 81 mg  81 mg Oral Pre-Cath Diaz, Ivar Drape, NP       [MAR Hold] aspirin EC tablet 81 mg  81 mg Oral  Daily Emeline General, MD   81 mg at 09/19/23 0936   [MAR Hold] atorvastatin (LIPITOR) tablet 80 mg  80 mg Oral Daily Eula Listen M, PA-C   80 mg at 09/19/23 0936   [MAR Hold] dapagliflozin propanediol (FARXIGA) tablet 10 mg  10 mg Oral Daily Bensimhon, Bevelyn Buckles, MD   10 mg at 09/19/23 0936   Heparin (Porcine) in NaCl 1000-0.9 UT/500ML-% SOLN    PRN Sabharwal, Aditya, DO       heparin ADULT infusion 100 units/mL (25000 units/265mL)  1,100 Units/hr Intravenous Continuous Foye Deer, RPH  11 mL/hr at 09/19/23 0855 1,100 Units/hr at 09/19/23 0855   [MAR Hold] HYDROcodone-acetaminophen (NORCO/VICODIN) 5-325 MG per tablet 1 tablet  1 tablet Oral Q8H PRN Emeline General, MD       [facility] Hold] hydrocortisone cream 1 %   Topical TID PRN Manuela Schwartz, NP       lidocaine (PF) (XYLOCAINE) 1 % injection    PRN Sabharwal, Aditya, DO       [MAR Hold] metoprolol succinate (TOPROL-XL) 24 hr tablet 25 mg  25 mg Oral Daily Bensimhon, Bevelyn Buckles, MD   25 mg at 09/19/23 0848   [MAR Hold] ondansetron (ZOFRAN) injection 4 mg  4 mg Intravenous Q6H PRN Emeline General, MD       [MAR Hold] sacubitril-valsartan (ENTRESTO) 24-26 mg per tablet  1 tablet Oral BID Alen Bleacher, NP       [facility] Hold] sodium chloride flush (NS) 0.9 % injection 3 mL  3 mL Intravenous Q12H Mikey College T, MD   3 mL at 09/18/23 2209   [facility] Hold] sodium chloride flush (NS) 0.9 % injection 3 mL  3 mL Intravenous PRN Emeline General, MD       [facility] Hold] spironolactone (ALDACTONE) tablet 25 mg  25 mg Oral Daily Bensimhon, Bevelyn Buckles, MD   25 mg at 09/18/23 4098     Discharge Medications: Please see discharge summary for a list of discharge medications.  Relevant Imaging Results:  Relevant Lab Results:   Additional Information SS#: [id number]  Margarito Liner, LCSW

## 2023-09-19 NOTE — Consult Note (Addendum)
  PHARMACY - ANTICOAGULATION CONSULT NOTE  Pharmacy Consult for Heparin infusion Indication: atrial fibrillation  Allergies  Allergen Reactions   Other Rash    Styrofoam - rash and "almost went blind"   Plasticized Base [Plastibase] Rash   Penicillins Rash   Patient Measurements: Height: 5\' 11"  (180.3 cm) Weight: 64 kg (141 lb 1.5 oz) IBW/kg (Calculated) : 75.3 Heparin Dosing Weight: 64.4 kg  Vital Signs: Temp: 99.2 F (37.3 C) (02/11 1239) Temp Source: Oral (02/11 1239) BP: 133/55 (02/11 1915) Pulse Rate: 88 (02/11 1915)  Labs: Recent Labs    09/17/23 0313 09/17/23 1101 09/18/23 0543 09/19/23 0157 09/19/23 1723 09/19/23 1728 09/19/23 1729  HGB 12.1*  --  12.2* 12.3* 13.3 13.6 13.3  HCT 36.8*  --  37.2* 38.8* 39.0 40.0 39.0  PLT 215  --  223 242  --   --   --   HEPARINUNFRC 0.49 0.43 0.45 0.33  --   --   --   CREATININE 0.75  --  0.83 0.81  --   --   --    Estimated Creatinine Clearance: 63.6 mL/min (by C-G formula based on SCr of 0.81 mg/dL).  Medical History: Past Medical History:  Diagnosis Date   Arthritis    DDD (degenerative disc disease)    Enlarged prostate    Environmental allergies    GERD (gastroesophageal reflux disease)    Hx of ulcer disease    Loss of weight    Nocturia    Prostate cancer (HCC)    Unspecified hemorrhoids without mention of complication    Medications:  No AC noted prior to admission.  Assessment: Patient admitted for acute HF, and increased shortness of breath. PHM includes CAD noted on CT imaging, prediabetes, hyperlipidemia, prostate cancer, rheumatoid arthritis, and osteoarthritis.  Troponin trending upward, ECHO pending, and new onset Afib noted. Pharmacy consulted to initiate heparin infusion for Afib. Plan to transition to DOAC prior to discharge.  2/11: Patient went for Right and Left heart catheter. Nursing called to inform pharmacy that band was removed at 1930 and to restart heparin at 2130, 2-hours after band  removal. Plan to re-start heparin at previous therapeutic rate.   Baseline:  Hgb 12.1 Plt 215 INR/PTT 1.3/16.7  Heparin Level: 02/08 1920 HL 0.16 SUBtherapeutic running at 900 units/hr 02/09 0313 HL 0.49 Therapeutic x 1 02/09 1101 HL 0.43 Therapeutic x 2 02/10 0543 HL 0.45 Therapeutic x 3 02/11 0157 HL 0.33 Therapeutic x 4 02/11 1330 Heparin was held for cath  Goal of Therapy:  Heparin level 0.3-0.7 units/ml Monitor platelets by anticoagulation protocol: Yes   Plan:  Re-started heparin drip at 1,100 units/hr  2130 two hours after band removal Check levels 8 hours after restart Monitor CBC and platelets daily while on heparin   Thank you for allowing pharmacy to participate in this patient's care.   Effie Shy, PharmD Pharmacy Resident  09/19/2023 7:52 PM

## 2023-09-19 NOTE — TOC Initial Note (Signed)
Transition of Care [facility]) - Initial/Assessment Note    Patient Details  Name: Keith Bates MRN: [id number] Date of Birth: Nov 29, 1940  Transition of [facility]) CM/SW Contact:    Margarito Liner, LCSW Phone Number: 09/19/2023, 3:53 PM  Clinical Narrative:   CSW met with patient. Son at bedside. CSW introduced role and explained that therapy recommendations would be discussed. Patient is agreeable to SNF placement. Will follow up with bed offers once available. No further concerns. CSW will continue to follow patient and his family for support and facilitate return home once stable.               Expected Discharge Plan: Skilled Nursing Facility Barriers to Discharge: Continued Medical Work up   Patient Goals and CMS Choice            Expected Discharge Plan and Services     Post Acute Care Choice: Skilled Nursing Facility Living arrangements for the past 2 months: Single Family Home                                      Prior Living Arrangements/Services Living arrangements for the past 2 months: Single Family Home Lives with:: Spouse Patient language and need for interpreter reviewed:: Yes Do you feel safe going back to the place where you live?: Yes      Need for Family Participation in Patient Care: Yes (Comment) Care giver support system in place?: Yes (comment)   Criminal Activity/Legal Involvement Pertinent to Current Situation/Hospitalization: No - Comment as needed  Activities of Daily Living      Permission Sought/Granted Permission sought to share information with : Facility Medical sales representative, Family Supports Permission granted to share information with : Yes, Verbal Permission Granted  Share Information with NAME: Taelon Bendorf  Permission granted to share info w AGENCY: SNF's  Permission granted to share info w Relationship: Son  Permission granted to share info w Contact Information: [phone]  Emotional Assessment Appearance:: Appears  stated age Attitude/Demeanor/Rapport: Engaged, Gracious Affect (typically observed): Accepting, Appropriate, Calm, Pleasant Orientation: : Oriented to Self, Oriented to Place, Oriented to  Time, Oriented to Situation Alcohol / Substance Use: Not Applicable Psych Involvement: No (comment)  Admission diagnosis:  CHF (congestive heart failure) (HCC) [I50.9] Acute congestive heart failure, unspecified heart failure type (HCC) [I50.9] Patient Active Problem List   Diagnosis Date Noted   Atrial flutter (HCC) 09/17/2023   Coronary artery calcification seen on CAT scan 09/17/2023   Elevated troponin 09/17/2023   Acute systolic CHF (congestive heart failure) (HCC) 09/17/2023   Dilated cardiomyopathy (HCC) 09/17/2023   Right leg DVT (HCC) 09/16/2023   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 [place] [address] Phone: [phone] Fax: [phone]     Social Drivers of Health (SDOH) Social History: SDOH Screenings   Food Insecurity: No Food Insecurity (  09/16/2023)  Housing: Low Risk  (09/18/2023)  Transportation Needs: No Transportation Needs (09/18/2023)  Utilities: Not At Risk (12/30/2022)  Alcohol Screen: Low Risk  (12/30/2022)  Depression (PHQ2-9): Low Risk  (12/30/2022)  Financial Resource Strain: Low Risk  (12/30/2022)  Physical Activity: Insufficiently Active (12/30/2022)   Social Connections: Socially Integrated (09/18/2023)  Stress: No Stress Concern Present (12/30/2022)  Tobacco Use: Medium Risk (09/15/2023)   SDOH Interventions:     Readmission Risk Interventions     No data to display

## 2023-09-19 NOTE — Progress Notes (Signed)
Called to the room for some questions the family had in regards to the upcoming procedure.   Daughter and son at the bedside, upset with communication and them not being called about test results or planned procedures. The AHF team had previously discussed (yesterday and this morning) with the patient (no family at the beside) the echo results and the indications/ risks of a L/RHC in detail and he agreed and verbalized understanding but family upset because they were not directly contacted. It appeared Mr. Elsen was a little forgetful. He told the family that he assumed he was being intubated and having a heart procedure today. Family upset because he was being "intubated". Went into detail with family today and explained entire procedure as well as I went over his echo and cMRI. Reiterated that there would be no intubation. After a lengthy discussion family agreed to proceed with [facility]. Consent was signed by the patient and he also verbalized consent in front family, myself and RN.   For future reference if there is a new test result (imaging) or procedure being done please reach out to ordering team so that it can be discussed with family. I also asked the family that if they had any questions to not hesitate to ask his nurse to reach out to [place] so that we can give them a call.   Brynda Peon, AGACNP-BC  Advanced Heart Failure Team  09/19/23

## 2023-09-20 ENCOUNTER — Encounter: Payer: Self-pay | Admitting: Cardiology

## 2023-09-20 DIAGNOSIS — E854 Organ-limited amyloidosis: Secondary | ICD-10-CM

## 2023-09-20 DIAGNOSIS — I483 Typical atrial flutter: Secondary | ICD-10-CM | POA: Diagnosis not present

## 2023-09-20 DIAGNOSIS — I43 Cardiomyopathy in diseases classified elsewhere: Secondary | ICD-10-CM

## 2023-09-20 DIAGNOSIS — I2510 Atherosclerotic heart disease of native coronary artery without angina pectoris: Secondary | ICD-10-CM | POA: Diagnosis not present

## 2023-09-20 DIAGNOSIS — I5021 Acute systolic (congestive) heart failure: Secondary | ICD-10-CM | POA: Diagnosis not present

## 2023-09-20 DIAGNOSIS — I5033 Acute on chronic diastolic (congestive) heart failure: Secondary | ICD-10-CM | POA: Diagnosis not present

## 2023-09-20 DIAGNOSIS — I4892 Unspecified atrial flutter: Secondary | ICD-10-CM | POA: Diagnosis not present

## 2023-09-20 DIAGNOSIS — I420 Dilated cardiomyopathy: Secondary | ICD-10-CM

## 2023-09-20 LAB — HEPARIN LEVEL (UNFRACTIONATED): Heparin Unfractionated: 0.23 [IU]/mL — ABNORMAL LOW (ref 0.30–0.70)

## 2023-09-20 LAB — CBC
HCT: 39.1 % (ref 39.0–52.0)
Hemoglobin: 12.5 g/dL — ABNORMAL LOW (ref 13.0–17.0)
MCH: 28.7 pg (ref 26.0–34.0)
MCHC: 32 g/dL (ref 30.0–36.0)
MCV: 89.7 fL (ref 80.0–100.0)
Platelets: 278 10*3/uL (ref 150–400)
RBC: 4.36 MIL/uL (ref 4.22–5.81)
RDW: 17.2 % — ABNORMAL HIGH (ref 11.5–15.5)
WBC: 8.5 10*3/uL (ref 4.0–10.5)
nRBC: 0 % (ref 0.0–0.2)

## 2023-09-20 LAB — BASIC METABOLIC PANEL
Anion gap: 10 (ref 5–15)
BUN: 23 mg/dL (ref 8–23)
CO2: 22 mmol/L (ref 22–32)
Calcium: 8.5 mg/dL — ABNORMAL LOW (ref 8.9–10.3)
Chloride: 103 mmol/L (ref 98–111)
Creatinine, Ser: 0.83 mg/dL (ref 0.61–1.24)
GFR, Estimated: >60 mL/min (ref >60)
Glucose, Bld: 135 mg/dL — ABNORMAL HIGH (ref 70–99)
Potassium: 4 mmol/L (ref 3.5–5.1)
Sodium: 135 mmol/L (ref 135–145)

## 2023-09-20 LAB — BETA-2-GLYCOPROTEIN I ABS, IGG/M/A
Beta-2 Glyco I IgG: <9 GPI IgG units (ref 0–20)
Beta-2-Glycoprotein I IgA: <9 GPI IgA units (ref 0–25)
Beta-2-Glycoprotein I IgM: <9 GPI IgM units (ref 0–32)

## 2023-09-20 LAB — IMMUNOFIXATION, URINE

## 2023-09-20 LAB — CARDIOLIPIN ANTIBODIES, IGG, IGM, IGA
Anticardiolipin IgA: <9 [APL'U]/mL (ref 0–11)
Anticardiolipin IgG: <9 [GPL'U]/mL (ref 0–14)
Anticardiolipin IgM: <9 [MPL'U]/mL (ref 0–12)

## 2023-09-20 MED ORDER — APIXABAN 5 MG PO TABS
10.0000 mg | ORAL_TABLET | Freq: Two times a day (BID) | ORAL | Status: DC
  Administered 2023-09-21 – 2023-09-22 (×3): 10 mg via ORAL
  Filled 2023-09-20 (×3): qty 2

## 2023-09-20 MED ORDER — APIXABAN 5 MG PO TABS
5.0000 mg | ORAL_TABLET | Freq: Two times a day (BID) | ORAL | Status: DC
  Administered 2023-09-20: 5 mg via ORAL
  Filled 2023-09-20: qty 1

## 2023-09-20 MED ORDER — APIXABAN 5 MG PO TABS
10.0000 mg | ORAL_TABLET | Freq: Once | ORAL | Status: AC
  Administered 2023-09-20: 10 mg via ORAL
  Filled 2023-09-20: qty 2

## 2023-09-20 MED ORDER — POLYETHYLENE GLYCOL 3350 17 G PO PACK
17.0000 g | PACK | Freq: Every day | ORAL | Status: DC
  Administered 2023-09-20 – 2023-09-22 (×3): 17 g via ORAL
  Filled 2023-09-20 (×3): qty 1

## 2023-09-20 MED ORDER — APIXABAN 5 MG PO TABS: 5.0000 mg | ORAL_TABLET | Freq: Two times a day (BID) | ORAL | Status: DC

## 2023-09-20 MED ORDER — HEPARIN BOLUS VIA INFUSION
1000.0000 [IU] | Freq: Once | INTRAVENOUS | Status: AC
  Administered 2023-09-20: 1000 [IU] via INTRAVENOUS
  Filled 2023-09-20: qty 1000

## 2023-09-20 NOTE — Consult Note (Signed)
  PHARMACY - ANTICOAGULATION CONSULT NOTE  Pharmacy Consult for Heparin infusion Indication: atrial fibrillation  Allergies  Allergen Reactions   Other Rash    Styrofoam - rash and "almost went blind"   Plasticized Base [Plastibase] Rash   Penicillins Rash   Patient Measurements: Height: 5\' 11"  (180.3 cm) Weight: 63.1 kg (139 lb 1.8 oz) IBW/kg (Calculated) : 75.3 Heparin Dosing Weight: 64.4 kg  Vital Signs: Temp: 98.4 F (36.9 C) (02/12 0346) BP: 108/59 (02/12 0346) Pulse Rate: 55 (02/12 0346)  Labs: Recent Labs    09/18/23 0543 09/19/23 0157 09/19/23 1723 09/19/23 1728 09/19/23 1729 09/20/23 0415  HGB 12.2* 12.3*   < > 13.6 13.3 12.5*  HCT 37.2* 38.8*   < > 40.0 39.0 39.1  PLT 223 242  --   --   --  278  HEPARINUNFRC 0.45 0.33  --   --   --  0.23*  CREATININE 0.83 0.81  --   --   --  0.83   < > = values in this interval not displayed.   Estimated Creatinine Clearance: 61.2 mL/min (by C-G formula based on SCr of 0.83 mg/dL).  Medical History: Past Medical History:  Diagnosis Date   Arthritis    DDD (degenerative disc disease)    Enlarged prostate    Environmental allergies    GERD (gastroesophageal reflux disease)    Hx of ulcer disease    Loss of weight    Nocturia    Prostate cancer (HCC)    Unspecified hemorrhoids without mention of complication    Medications:  No AC noted prior to admission.  Assessment: Patient admitted for acute HF, and increased shortness of breath. PHM includes CAD noted on CT imaging, prediabetes, hyperlipidemia, prostate cancer, rheumatoid arthritis, and osteoarthritis.  Troponin trending upward, ECHO pending, and new onset Afib noted. Pharmacy consulted to initiate heparin infusion for Afib. Plan to transition to DOAC prior to discharge.  2/11: Patient went for Right and Left heart catheter. Nursing called to inform pharmacy that band was removed at 1930 and to restart heparin at 2130, 2-hours after band removal. Plan to  re-start heparin at previous therapeutic rate.   Baseline:  Hgb 12.1 Plt 215 INR/PTT 1.3/16.7  Heparin Level: 02/08 1920 HL 0.16 SUBtherapeutic running at 900 units/hr 02/09 0313 HL 0.49 Therapeutic x 1 02/09 1101 HL 0.43 Therapeutic x 2 02/10 0543 HL 0.45 Therapeutic x 3 02/11 0157 HL 0.33 Therapeutic x 4 02/11 1330 Heparin was held for cath 02/12  0415 HL 0.23 SUBtherapeutic   Goal of Therapy:  Heparin level 0.3-0.7 units/ml Monitor platelets by anticoagulation protocol: Yes   Plan:  Bolus 1000 units x 1 Increase heparin drip to 1,250 units/hr  Check levels 8 hours after rate change Monitor CBC and platelets daily while on heparin   Thank you for allowing pharmacy to participate in this patient's care.   Bettey Costa, PharmD Clinical Pharmacist 09/20/2023 5:29 AM

## 2023-09-20 NOTE — Progress Notes (Signed)
Physical Therapy Treatment Patient Details Name: Keith Bates MRN: [id number] DOB: 28-Feb-1941 Today's Date: 09/20/2023   History of Present Illness Pt is an 83 yo M with PMH that includes internal hemorrhoids and prostate cancer on hormone manipulation who presented with worsening of exertional dyspnea and leg swelling.  MD assessment includes: Acute systolic CHF, A flutter, RLE DVT, and CAD with elevated troponin.    PT Comments  Pt was sitting in recliner upon arrival. He is alert but c/o severe BLE foot pain." It's been this way since I've got here." Per chart review and discussion with OT, pt has not c/o BLE foot pain throughout admission. Pt only willing to get up from recliner and then to get back into bed. He requires extensive assistance to achieve standing . Once in standing was able to take a few steps to EOB with a little assistance. Min assist required to progress BLEs into bed from seated EOB. Author encouraged pt to perform increased activity daily. TEE scheduled for tomorrow. Acute PT will continue to follow and progress as able per current POC.     If plan is discharge home, recommend the following: A little help with walking and/or transfers;A little help with bathing/dressing/bathroom;Assistance with cooking/housework;Assist for transportation;Help with stairs or ramp for entrance     Equipment Recommendations  Rolling walker (2 wheels);BSC/3in1       Precautions / Restrictions Precautions Precautions: Fall Restrictions Weight Bearing Restrictions Per Provider Order: No     Mobility  Bed Mobility Overal bed mobility: Needs Assistance Bed Mobility: Sit to Supine  Sit to supine: Min assist General bed mobility comments: min assist to progress BLEs into bed from EOB sitting. Pt likes to dictate session progression and was unwillinmg to perform most task requested of him. Self limiting    Transfers Overall transfer level: Needs assistance Equipment used: Rolling walker (2  wheels) Transfers: Sit to/from Stand Sit to Stand: Mod assist, From elevated surface Stand pivot transfers: Mod assist  General transfer comment: Mod assist to stand from recliner with vcs for technique improvements and sequencing improvements.    Ambulation/Gait Ambulation/Gait assistance: Min assist Gait Distance (Feet): 3 Feet Assistive device: Rolling walker (2 wheels) Gait Pattern/deviations: Step-to pattern, Antalgic, Trunk flexed Gait velocity: decreased  General Gait Details: pt only willing to take a few steps from recliner to EOB with vcs for posture correction and overall technique improvements. pt remains somewhat self limiting during very limited session. Author encouraged pt to increase seated and supine exercises as able to promote strengthening and limit deconditioning    Balance Overall balance assessment: Needs assistance Sitting-balance support: Feet supported, No upper extremity supported Sitting balance-Leahy Scale: Normal     Standing balance support: Bilateral upper extremity supported, During functional activity, Reliant on assistive device for balance Standing balance-Leahy Scale: Fair     Hotel manager: No apparent difficulties  Cognition Arousal: Alert Behavior During Therapy: WFL for tasks assessed/performed   PT - Cognitive impairments: No apparent impairments    PT - Cognition Comments: pt is A and O + agreeable to session. Following commands: Intact            General Comments General comments (skin integrity, edema, etc.): Author ecouraged and educated pt on importance of increasing activity to promote strengthening and improve activity tolerance  pt states understanding but was unwilling to perform desired exercises at this time due to co BLE foot pain      Pertinent Vitals/Pain Pain Assessment Pain Assessment: 0-10 Pain  Score: 10-Worst pain ever Pain Intervention(s): Limited activity within patient's  tolerance, Monitored during session, Premedicated before session, Repositioned     PT Goals (current goals can now be found in the care plan section) Acute Rehab PT Goals Patient Stated Goal: none stated Progress towards PT goals: Not progressing toward goals - comment (self limiting today)    Frequency    Min 1X/week       AM-PAC PT "6 Clicks" Mobility   Outcome Measure  Help needed turning from your back to your side while in a flat bed without using bedrails?: A Little Help needed moving from lying on your back to sitting on the side of a flat bed without using bedrails?: A Little Help needed moving to and from a bed to a chair (including a wheelchair)?: A Little Help needed standing up from a chair using your arms (e.g., wheelchair or bedside chair)?: A Lot Help needed to walk in hospital room?: A Little Help needed climbing 3-5 steps with a railing? : A Lot 6 Click Score: 16    End of Session   Activity Tolerance: Patient limited by fatigue;Patient limited by pain (limited by c/o BLE(foot pain)) Patient left: in bed;with call bell/phone within reach;with bed alarm set Nurse Communication: Mobility status PT Visit Diagnosis: Difficulty in walking, not elsewhere classified (R26.2);Muscle weakness (generalized) (M62.81);Pain Pain - Right/Left: Right Pain - part of body: Leg     Time: 1027-2536 PT Time Calculation (min) (ACUTE ONLY): 10 min  Charges:    $Therapeutic Activity: 8-22 mins PT General Charges $$ ACUTE PT VISIT: 1 Visit                    Jetta Lout PTA 09/20/23, 4:38 PM

## 2023-09-20 NOTE — Progress Notes (Signed)
Discussed TEE/DCCV with patient and daughter today. Both in agreement to proceed tomorrow. Orders placed for TEE/DCCV tomorrow. NPO at midnight.    Informed Consent   Shared Decision Making/Informed Consent   The risks [stroke, cardiac arrhythmias rarely resulting in the need for a temporary or permanent pacemaker, skin irritation or burns, esophageal damage, perforation (1:10,000 risk), bleeding, pharyngeal hematoma as well as other potential complications associated with conscious sedation including aspiration, arrhythmia, respiratory failure and death], benefits (treatment guidance, restoration of normal sinus rhythm, diagnostic support) and alternatives of a transesophageal echocardiogram guided cardioversion were discussed in detail with Keith Bates and he is willing to proceed.      Alen Bleacher AGACNP-BC  Advanced Heart Failure Team  09/20/23 2:38 PM

## 2023-09-20 NOTE — Progress Notes (Signed)
Advanced Heart Failure Rounding Note  Cardiologist: None  Chief Complaint:  Subjective:   cMRI 2/10 with LVEF 33%, diffuse subendocardial LGE, mod dilated LA/RA, mod dilated RV, normal RV function. Suggestive of amyloid.   K/L ratio WNL. MMP pending  L/RHC 2/11: RA 5, PA 39/12 (22), PCWP 9, Fick CO/CI 3.5/1.9, PVR 3.7, PAPi 5.4. Severe multivessel CAD. Severely reduced cardiac output & index. Normal to low filling pressures. 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 No complaints.   Objective:   Weight Range: 63.1 kg Body mass index is 19.4 kg/m.   Vital Signs:   Temp:  [97.4 F (36.3 C)-99.3 F (37.4 C)] 99.3 F (37.4 C) (02/12 0758) Pulse Rate:  [42-95] 63 (02/12 0758) Resp:  [11-24] 16 (02/12 0758) BP: (105-146)/(54-102) 125/73 (02/12 0758) SpO2:  [93 %-98 %] 97 % (02/12 0758) Weight:  [63.1 kg] 63.1 kg (02/12 0502) Last BM Date : 09/17/23  Weight change: Filed Weights   09/18/23 0500 09/19/23 0559 09/20/23 0502  Weight: 66.6 kg 64 kg 63.1 kg    Intake/Output:   Intake/Output Summary (Last 24 hours) at 09/20/2023 0951 Last data filed at 09/20/2023 8119 Gross per 24 hour  Intake 120 ml  Output 1550 ml  Net -1430 ml    Physical Exam    General:  elderly appearing.  No respiratory difficulty HEENT: normal Neck: supple. JVD ~6 cm. Carotids 2+ bilat; no bruits. No lymphadenopathy or thyromegaly appreciated. Cor: PMI nondisplaced. Regular rate & irregular rhythm. No rubs, gallops or murmurs. Lungs: clear Abdomen: soft, nontender, nondistended. No hepatosplenomegaly. No bruits or masses. Good bowel sounds. Extremities: no cyanosis, clubbing, rash, edema  Neuro: alert & oriented x 3, cranial nerves grossly intact. moves all 4 extremities w/o difficulty. Affect pleasant.   Telemetry   Atrial flutter with PVCs 70s (Personally reviewed)    EKG    No new EKG to review  Labs    CBC Recent Labs    09/19/23 0157  09/19/23 1723 09/19/23 1729 09/20/23 0415  WBC 10.3  --   --  8.5  HGB 12.3*   < > 13.3 12.5*  HCT 38.8*   < > 39.0 39.1  MCV 90.9  --   --  89.7  PLT 242  --   --  278   < > = values in this interval not displayed.   Basic Metabolic Panel Recent Labs    14/78/29 0157 09/19/23 1723 09/19/23 1729 09/20/23 0415  NA 132*   < > 136 135  K 4.0   < > 4.0 4.0  CL 100  --   --  103  CO2 21*  --   --  22  GLUCOSE 119*  --   --  135*  BUN 22  --   --  23  CREATININE 0.81  --   --  0.83  CALCIUM 8.6*  --   --  8.5*   < > = values in this interval not displayed.   Liver Function Tests Recent Labs    09/17/23 1101 09/18/23 1536  AST 32  --   ALT 36  --   ALKPHOS 69  --   BILITOT 2.8* 1.3*  PROT 6.5  --   ALBUMIN 3.3*  --    No results for input(s): "LIPASE", "AMYLASE" in the last 72 hours. Cardiac Enzymes No results for input(s): "CKTOTAL", "CKMB", "CKMBINDEX", "TROPONINI" in the last 72 hours.  BNP: BNP (last  3 results) Recent Labs    09/15/23 1044  BNP 707.9*    ProBNP (last 3 results) No results for input(s): "PROBNP" in the last 8760 hours.   D-Dimer No results for input(s): "DDIMER" in the last 72 hours. Hemoglobin A1C No results for input(s): "HGBA1C" in the last 72 hours. Fasting Lipid Panel No results for input(s): "CHOL", "HDL", "LDLCALC", "TRIG", "CHOLHDL", "LDLDIRECT" in the last 72 hours. Thyroid Function Tests No results for input(s): "TSH", "T4TOTAL", "T3FREE", "THYROIDAB" in the last 72 hours.  Invalid input(s): "FREET3"  Other results:   Imaging    CARDIAC CATHETERIZATION Result Date: 09/19/2023 HEMODYNAMICS: RA:   5 mmHg (mean) RV:   43/5 mmHg PA:   39/12 mmHg (22 mean) PCWP:  9 mmHg (mean)    Estimated Fick CO/CI   3.5 L/min, 1.9 L/min/m2    TPG    13  mmHg     PVR     3.7 Wood Units PAPi      5.4 Coronary angiography: LM: Normal caliber left main with 30%-40% diffuse calcific disease as it bifurcates into the LAD & LCX LAD: Moderate  caliber vessel that tapers off as it approaches the LV apex with  mild diffuse disease. There is a focal ectatic 80-90% lesion at the proximal third of the LAD. Lcx: Codominant vessel. The native AV circumflex has a focal 80% stenosis at the take off of a large OM1/L-PDA which goes which multiple tandem 80% lesions a the proximal and mid third of the vessel. RCA: Small caliber diffusely diseased vessel that gives off a small R-PDA. There are multiple tandem calcific 80-90% lesions at the proximal and mid third of the vessel. IMPRESSION: Severe multivessel CAD Severely reduced cardiac output & index Normal to low filling pressures. 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 Aditya Sabharwal 6:26 PM     Medications:     Scheduled Medications:  amiodarone  200 mg Oral Daily   apixaban  5 mg Oral BID   aspirin EC  81 mg Oral Daily   atorvastatin  80 mg Oral Daily   dapagliflozin propanediol  10 mg Oral Daily   metoprolol succinate  25 mg Oral Daily   sacubitril-valsartan  1 tablet Oral BID   sodium chloride flush  3 mL Intravenous Q12H   sodium chloride flush  3 mL Intravenous Q12H   spironolactone  25 mg Oral Daily    Infusions:  sodium chloride      PRN Medications: sodium chloride, acetaminophen, acetaminophen, HYDROcodone-acetaminophen, hydrocortisone cream, ondansetron (ZOFRAN) IV, ondansetron (ZOFRAN) IV, sodium chloride flush, sodium chloride flush    Patient Profile   Japhet Morgenthaler is a 83 y.o. AAM with arthritis and prostate CA on hormonal therapy. He has no previous cardiac history. Admitted with new onset CHF and concerns for cardiac amyloid.   Assessment/Plan  1. Acute HFrEF:  - New onset - Echo this admit EF 35-40% global HK moderate LVH. 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 ? Tachy-mediated (AFL on admit) vs infiltrative process vs HTN CM vs frequent PVCs. Clinical suspicion high for potential amyloid.  - cMRI yesterday with  LVEF 33%, diffuse subendocardial LGE, mod dilated LA/RA, mod dilated RV, normal RV function. Consider amyloid.  - Volume looks good. Does not currently need a diuretic -  Continue Entresto 24-26 mg BID  - Continue spiro 25 mg daily - Continue farxiga 10 mg daily - Continue Toprol XL 25 daily - L/RHC 2/11: RA 5,  PA 39/12 (22), PCWP 9, Fick CO/CI 3.5/1.9, PVR 3.7, PAPi 5.4. Severe multivessel CAD. Severely reduced cardiac output & index. Normal to low filling pressures. 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 K/L ratio WNL  2. CAD with elevated high-sensitivity troponin:  - Has coronary calcifications on CT. No chest pain - Initial high-sensitivity troponin 44 with delta troponin of 50 - LHC yesterday with severe multivessel CAD.  - Denies CP - Continue ASA/statin  3. Atrial flutter with slow VR:  - Transition heparin>Eliquis 5 mg BID - Plan TEE/DC-CV prior to d/c  - Continue amiodarone 200 mg daily. Watch for increasing block   4. Frequent PVCs - Continue amiodarone   5. Aortic stenosis/mitral regurgitation: - AS is mild to moderate   6. Hypokalemia: - resolved - Continue spiro    7. 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 DOAC on d/c - has Fhx of DVT. Hypercoag w/u   8. History of GI bleed secondary to hemorrhoids: -Denies current bleeding -Hgb stable on heparin gtt -Monitor Hgb with transition to eliquis  Continue working with PT/OT  Length of Stay: 5  Alen Bleacher, NP  09/20/2023, 9:51 AM  Advanced Heart Failure Team Pager [phone] (M-F; 7a - 5p)  Please contact [facility] for night-coverage after hours (5p -7a ) and weekends on [email or web address]

## 2023-09-20 NOTE — Progress Notes (Signed)
Occupational Therapy Treatment Patient Details Name: Keith Bates MRN: [id number] DOB: 1941-01-04 Today's Date: 09/20/2023   History of present illness Pt is an 83 yo M with PMH that includes internal hemorrhoids and prostate cancer on hormone manipulation who presented with worsening of exertional dyspnea and leg swelling.  MD assessment includes: Acute systolic CHF, A flutter, RLE DVT, and CAD with elevated troponin.   OT comments  Pt is supine in bed on arrival. Pleasant and agreeable to OT session. He does not mention pain. Pt had heart cath yesterday and is not able to use R hand/wrist during session. He needed Min/CGA for bed mobility and Mod A to scoot forward. Pt required Min/Mod A for STS from elevated bed height and SPT to recliner via HHA. STS from recliner with Min/mod A while his daughter provided Max A/total assist for LB bathing and peri-care. Linens and gown soiled all changed. Pt MIN/Mod A for UB ADLs d/t inability to use RUE.  Pt left seated in recliner with all needs in place and will cont to require skilled acute OT services to maximize his safety and IND to return to PLOF.       If plan is discharge home, recommend the following:  A little help with bathing/dressing/bathroom;Assistance with cooking/housework;A little help with walking and/or transfers;A lot of help with walking and/or transfers;Assist for transportation;Help with stairs or ramp for entrance   Equipment Recommendations  Other (comment) (defer to next vnue)    Recommendations for Other Services      Precautions / Restrictions Precautions Precautions: Fall Restrictions Weight Bearing Restrictions Per Provider Order: No       Mobility Bed Mobility Overal bed mobility: Needs Assistance Bed Mobility: Supine to Sit     Supine to sit: Min assist, Contact guard     General bed mobility comments: d/t inability to use RUE from heart cath yesterday    Transfers Overall transfer level: Needs  assistance Equipment used: 1 person hand held assist Transfers: Sit to/from Stand, Bed to chair/wheelchair/BSC Sit to Stand: Mod assist, From elevated surface Stand pivot transfers: Min assist, Mod assist         General transfer comment: Min/Mod A increased time and cues d/t inability to use RUE from heart cath yesterday; HHA x1 and mult STS from recliner with Min/Mod A     Balance Overall balance assessment: Needs assistance Sitting-balance support: Feet supported, No upper extremity supported Sitting balance-Leahy Scale: Normal     Standing balance support: Bilateral upper extremity supported, During functional activity, Reliant on assistive device for balance Standing balance-Leahy Scale: Fair                             ADL either performed or assessed with clinical judgement   ADL Overall ADL's : Needs assistance/impaired     Grooming: Wash/dry face;Sitting;Set up Grooming Details (indicate cue type and reason): set up assist in recliner Upper Body Bathing: Moderate assistance;Sitting Upper Body Bathing Details (indicate cue type and reason): unable to use RUE d/t heart cath Lower Body Bathing: Maximal assistance;Total assistance;Sit to/from stand;Sitting/lateral leans Lower Body Bathing Details (indicate cue type and reason): for peri-care/buttocks with Min A to maintain standing balance Upper Body Dressing : Minimal assistance;Sitting Upper Body Dressing Details (indicate cue type and reason): to doff/don gown     Toilet Transfer: Moderate assistance Toilet Transfer Details (indicate cue type and reason): simulated to recliner with Mod A via HHA  Extremity/Trunk Assessment              Occupational psychologist Communication: No apparent difficulties   Cognition Arousal: Alert Behavior During Therapy: WFL for tasks assessed/performed                                           Cueing   Cueing Techniques: Verbal cues  Exercises      Shoulder Instructions       General Comments      Pertinent Vitals/ Pain       Pain Assessment Pain Assessment: No/denies pain Pain Intervention(s): Monitored during session, Repositioned  Home Living                                          Prior Functioning/Environment              Frequency  Min 1X/week        Progress Toward Goals  OT Goals(current goals can now be found in the care plan section)  Progress towards OT goals: Progressing toward goals  Acute Rehab OT Goals Patient Stated Goal: move better OT Goal Formulation: With patient/family Time For Goal Achievement: 10/01/23 Potential to Achieve Goals: Good  Plan      Co-evaluation                 AM-PAC OT "6 Clicks" Daily Activity     Outcome Measure   Help from another person eating meals?: None Help from another person taking care of personal grooming?: None Help from another person toileting, which includes using toliet, bedpan, or urinal?: A Little Help from another person bathing (including washing, rinsing, drying)?: A Little Help from another person to put on and taking off regular upper body clothing?: A Little Help from another person to put on and taking off regular lower body clothing?: A Little 6 Click Score: 20    End of Session    OT Visit Diagnosis: Muscle weakness (generalized) (M62.81);Other abnormalities of gait and mobility (R26.89);Unsteadiness on feet (R26.81)   Activity Tolerance Patient tolerated treatment well   Patient Left in chair;with call bell/phone within reach;with chair alarm set;with family/visitor present   Nurse Communication Mobility status        Time: 1610-9604 OT Time Calculation (min): 40 min  Charges: OT General Charges $OT Visit: 1 Visit OT Treatments $Self Care/Home Management : 23-37 mins $Therapeutic Activity: 8-22 mins  [name],  OTR/L  09/20/23, 1:26 PM   [name] 09/20/2023, 1:23 PM

## 2023-09-20 NOTE — Progress Notes (Signed)
 Progress Note   Patient: Keith Bates ZOX:[id number] DOB: Oct 07, 1940 DOA: 09/15/2023     5 DOS: the patient was seen and examined on 09/20/2023   Brief hospital course: Keith Bates is a 83 y.o. male with medical history significant of internal hemorrhoids, prostate cancer on hormone manipulation, presented with worsening of exertional dyspnea and leg swelling.   Patient is admitted to the hospitalist service for further management evaluation of acute CHF.  Echo showed EF 35-40%, global hypokinesis. Cardiology evaluated him. Patient is found to have right leg DVT and started on heparin drip as per pharmacy protocol.  Patient is started on metoprolol, amiodarone therapy.  Cardiac MRI showed EF 33%, diffuse endocardial gadolinium enhancement possible amyloid.  Heart failure team advised right and left heart cath, possible cardioversion prior to discharge.  2/12: Hemodynamically stable, clinically appears euvolemic.  Had a bed offer at [facility], heart failure team to decide about DCCV so we can plan discharge.  Cardiac cath with multivessel advanced disease,  no intervention was done, current plan is for medical management.  Assessment and Plan: Acute systolic CHF  Echo shows EF 35-40%, global hypokinesis. Stopped IV Lasix 40 mg as he diuresed well and is euvolemic. Cardiology follow-up appreciated. Seen by Heart failure team. cMRI showed LVEF 33%, diffuse subendocardial gadolinium enhancement, ? Amyloid.  Cardiac cath with multivessel severe disease, no intervention done, need aggressive medical management Continue beta-blocker, ARB, farxiga, aldactone. Daily weights, strict I and Os, monitor urine output. Monitor electrolytes, renal function.   A flutter, rate controlled Continue heparin drip as per pharmacy protocol. Continue metoprolol therapy. He is on amiodarone 200 daily to decease PVC burden. Likely need DCCV before discharge Continue telemetry monitoring. Watch for  hemorrhoidal bleeding.  Acute right leg DVT: CTA chest negative for PE. Ultrasound shows of extensive right lower extremity DVT. Continue heparin drip as per pharmacy protocol. Monitor aptt, bleeding risk explained.  Hypokalemia- K improved. Stopped oral Kcl supplements. Monitor daily electrolytes.   CAD with elevated troponin Continue heparin drip per pharmacy protocol. Cardiology plan to do right and left heart cath today. I discussed with patient's daughter, son at bedside regarding the current work up and plan for heart cath. Family upset about miscommunication as Keith Bates seems forgetful. I advised family to let nurse know of any questions or concerns so MD can talk and clarify.  Cardiology team at bedside discussed in detail about the procedure. Consent signed for cath procedure.  Hypertension Continue metoprolol, lisinopril.  Hyperlipidemia- Continue atorvastatin.  History of prostate cancer Stable, gets hormonal injection every 6 months Increased bili (fractionated) noted. Will follow.  Out of bed to chair. Incentive spirometry. Nursing supportive care. Fall, aspiration precautions. DVT prophylaxis   Code Status: Full Code  Subjective: Patient was sitting comfortably in chair when seen today.  No new collections.  No chest pain or shortness of breath while sitting.  Daughter at bedside who had a lot of questions which were answered to the best of my ability.  Physical Exam: Vitals:   09/20/23 0346 09/20/23 0502 09/20/23 0758 09/20/23 1138  BP: (!) 108/59  125/73 126/76  Pulse: (!) 55  63 77  Resp: 18  16 18   Temp: 98.4 F (36.9 C)  99.3 F (37.4 C) 98.1 F (36.7 C)  TempSrc:   Oral Oral  SpO2: 98%  97% 98%  Weight:  63.1 kg    Height:       General.  Frail elderly man, in no acute distress. Pulmonary.  Lungs clear bilaterally, normal respiratory effort. CV.  Irregularly irregular Abdomen.  Soft, nontender, nondistended, BS positive. CNS.  Alert and  oriented .  No focal neurologic deficit. Extremities.  No edema, no cyanosis, pulses intact and symmetrical.  Data Reviewed:      Latest Ref Rng & Units 09/20/2023    4:15 AM 09/19/2023    5:29 PM 09/19/2023    5:28 PM  CBC  WBC 4.0 - 10.5 K/uL 8.5     Hemoglobin 13.0 - 17.0 g/dL 16.1  09.6  04.5   Hematocrit 39.0 - 52.0 % 39.1  39.0  40.0   Platelets 150 - 400 K/uL 278         Latest Ref Rng & Units 09/20/2023    4:15 AM 09/19/2023    5:29 PM 09/19/2023    5:28 PM  BMP  Glucose 70 - 99 mg/dL 409     BUN 8 - 23 mg/dL 23     Creatinine 8.11 - 1.24 mg/dL 9.14     Sodium 782 - 956 mmol/L 135  136  137   Potassium 3.5 - 5.1 mmol/L 4.0  4.0  4.1   Chloride 98 - 111 mmol/L 103     CO2 22 - 32 mmol/L 22     Calcium 8.9 - 10.3 mg/dL 8.5      CARDIAC CATHETERIZATION Result Date: 09/19/2023 HEMODYNAMICS: RA:   5 mmHg (mean) RV:   43/5 mmHg PA:   39/12 mmHg (22 mean) PCWP:  9 mmHg (mean)    Estimated Fick CO/CI   3.5 L/min, 1.9 L/min/m2    TPG    13  mmHg     PVR     3.7 Wood Units PAPi      5.4 Coronary angiography: LM: Normal caliber left main with 30%-40% diffuse calcific disease as it bifurcates into the LAD & LCX LAD: Moderate caliber vessel that tapers off as it approaches the LV apex with  mild diffuse disease. There is a focal ectatic 80-90% lesion at the proximal third of the LAD. Lcx: Codominant vessel. The native AV circumflex has a focal 80% stenosis at the take off of a large OM1/L-PDA which goes which multiple tandem 80% lesions a the proximal and mid third of the vessel. RCA: Small caliber diffusely diseased vessel that gives off a small R-PDA. There are multiple tandem calcific 80-90% lesions at the proximal and mid third of the vessel. IMPRESSION: Severe multivessel CAD Severely reduced cardiac output & index Normal to low filling pressures. 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 A dedicated cardiac coil was used. Functional imaging was done using Fiesta sequences. 2,3, and 4 chamber views were done to assess for RWMA's. Modified Simpson's rule using a short axis stack was used to calculate an ejection fraction on a dedicated work [employer] officer, [employer]. The patient received 9 cc of Gadavist. After 10 minutes inversion recovery sequences were used to assess for infiltration and scar tissue. Velocity flow mapping performed in the ascending aorta and main pulmonary artery. CONTRAST:  9 cc  of Gadavist FINDINGS: 1. Normal left ventricular size, mild-moderate LV thickness, 1.4 cm in diameter, moderately reduced LV systolic function (LVEF = 33%). There are no regional wall motion abnormalities. There is  diffuse subendocardial late gadolinium enhancement in the left ventricular myocardium. LVEDV: 144 ml LVESV: 97 ml SV: 47 ml CO: 3.1 L/min Myocardial mass: 154g Native LV T1 value 1034 ms (normal <1000 ms) LV ECV 33% (normal <30%) 2. Moderately dilated right ventricular size, normal thickness and systolic function there are no regional wall motion abnormalities. 3.  Moderately dilated left and right atrial size. 4. Normal size of the aortic root, ascending aorta and pulmonary artery. 5. Aortic valve is Trileaflet, mild stenosis per planimetry. Mild tricuspid regurgitation. 6.  Normal pericardium.  No pericardial effusion. IMPRESSION: 1.  Moderately reduced LV systolic function.  LVEF 33%. 2.  Diffuse subendocardial LGE, with no specific vascular territory. 3.  Moderately dilated left and right atrial size. 4.  Moderately dilated RV size, normal RV function. 5.  Consider infiltrative disease such as amyloid. Electronically Signed   By: Debbe Odea M.D.   On: 09/18/2023 17:23   Keith CARDIAC VELOCITY FLOW MAP Result  Date: 09/18/2023 CLINICAL DATA:  Cardiomyopathy, atrial flutter, eval for infiltrative disease EXAM: CARDIAC MRI TECHNIQUE: The patient was scanned on a 1.5 Tesla Siemens magnet. A dedicated cardiac coil was used. Functional imaging was done using Fiesta sequences. 2,3, and 4 chamber views were done to assess for RWMA's. Modified Simpson's rule using a short axis stack was used to calculate an ejection fraction on a dedicated work [employer] officer, [employer]. The patient received 9 cc of Gadavist. After 10 minutes inversion recovery sequences were used to assess for infiltration and scar tissue. Velocity flow mapping performed in the ascending aorta and main pulmonary artery. CONTRAST:  9 cc  of Gadavist FINDINGS: 1. Normal left ventricular size, mild-moderate LV thickness, 1.4 cm in diameter, moderately reduced LV systolic function (LVEF = 33%). There are no regional wall motion abnormalities. There is diffuse subendocardial late gadolinium enhancement in the left ventricular myocardium. LVEDV: 144 ml LVESV: 97 ml SV: 47 ml CO: 3.1 L/min Myocardial mass: 154g Native LV T1 value 1034 ms (normal <1000 ms) LV ECV 33% (normal <30%) 2. Moderately dilated right ventricular size, normal thickness and systolic function there are no regional wall motion abnormalities. 3.  Moderately dilated left and right atrial size. 4. Normal size of the aortic root, ascending aorta and pulmonary artery. 5. Aortic valve is Trileaflet, mild stenosis per planimetry. Mild tricuspid regurgitation. 6.  Normal pericardium.  No pericardial effusion. IMPRESSION: 1.  Moderately reduced LV systolic function.  LVEF 33%. 2.  Diffuse subendocardial LGE, with no specific vascular territory. 3.  Moderately dilated left and right atrial size. 4.  Moderately dilated RV size, normal RV function. 5.  Consider infiltrative disease such as amyloid. Electronically Signed   By: Debbe Odea M.D.   On: 09/18/2023 17:23   Family Communication: Discussed  with daughter at bedside  Disposition: Status is: Inpatient Remains inpatient appropriate because: Right lower extremity DVT, atrial flutter, new CHF, need DCCV before discharge.  Planned Discharge Destination: SNF  DVT prophylaxis.  Eliquis Time spent: 50 minutes  This record has been created using Conservation officer, historic buildings. Errors have been sought and corrected,but may not always be located. Such creation errors do not reflect on the standard of care.   Author: Arnetha Courser, MD 09/20/2023 1:37 PM Secure chat 7am to 7pm For on call review [email or web address].

## 2023-09-20 NOTE — TOC Progression Note (Addendum)
Transition of Care [facility]) - Progression Note    Patient Details  Name: Keith Bates MRN: [id number] Date of Birth: 05-13-1941  [facility] CM/SW Contact  Margarito Liner, LCSW Phone Number: 09/20/2023, 1:31 PM  Clinical Narrative:   CSW met with patient and daughter at bedside and provided SNF bed offers. Wife's preferences are [facility] and [facility] which have both offered a bed. Wife has accepted offer from [facility]. Patient is agreeable as well. CSW left message for admissions coordinator to notify.  4:04 pm: Per MD, potential discharge Friday. [facility] admissions coordinator is aware. He said they should have a bed but will let TOC know if that changes. CSW started insurance authorization. Patient and daughter are aware.  Expected Discharge Plan: Skilled Nursing Facility Barriers to Discharge: Continued Medical Work up  Expected Discharge Plan and Services     Post Acute Care Choice: Skilled Nursing Facility Living arrangements for the past 2 months: Single Family Home                                       Social Determinants of Health (SDOH) Interventions SDOH Screenings   Food Insecurity: No Food Insecurity (09/16/2023)  Housing: Low Risk  (09/18/2023)  Transportation Needs: No Transportation Needs (09/18/2023)  Utilities: Not At Risk (12/30/2022)  Alcohol Screen: Low Risk  (12/30/2022)  Depression (PHQ2-9): Low Risk  (12/30/2022)  Financial Resource Strain: Low Risk  (12/30/2022)  Physical Activity: Insufficiently Active (12/30/2022)  Social Connections: Socially Integrated (09/18/2023)  Stress: No Stress Concern Present (12/30/2022)  Tobacco Use: Medium Risk (09/15/2023)    Readmission Risk Interventions     No data to display

## 2023-09-21 ENCOUNTER — Encounter: Payer: Self-pay | Admitting: Internal Medicine

## 2023-09-21 ENCOUNTER — Inpatient Hospital Stay: Payer: Medicare Other | Admitting: Anesthesiology

## 2023-09-21 ENCOUNTER — Inpatient Hospital Stay (HOSPITAL_COMMUNITY)
Admit: 2023-09-21 | Discharge: 2023-09-21 | Disposition: A | Discharge status: Home / Self Care | Payer: Medicare Other | Attending: Internal Medicine | Admitting: Internal Medicine

## 2023-09-21 ENCOUNTER — Encounter
Admission: EM | Disposition: A | Discharge status: Skilled Nursing Facility | Payer: Self-pay | Source: Ambulatory Visit | Attending: Internal Medicine

## 2023-09-21 DIAGNOSIS — I350 Nonrheumatic aortic (valve) stenosis: Secondary | ICD-10-CM

## 2023-09-21 DIAGNOSIS — I340 Nonrheumatic mitral (valve) insufficiency: Secondary | ICD-10-CM | POA: Diagnosis not present

## 2023-09-21 DIAGNOSIS — I4891 Unspecified atrial fibrillation: Secondary | ICD-10-CM | POA: Diagnosis not present

## 2023-09-21 DIAGNOSIS — I5033 Acute on chronic diastolic (congestive) heart failure: Secondary | ICD-10-CM | POA: Diagnosis not present

## 2023-09-21 DIAGNOSIS — I4892 Unspecified atrial flutter: Secondary | ICD-10-CM | POA: Diagnosis not present

## 2023-09-21 DIAGNOSIS — I5021 Acute systolic (congestive) heart failure: Secondary | ICD-10-CM | POA: Diagnosis not present

## 2023-09-21 DIAGNOSIS — I361 Nonrheumatic tricuspid (valve) insufficiency: Secondary | ICD-10-CM

## 2023-09-21 DIAGNOSIS — E854 Organ-limited amyloidosis: Secondary | ICD-10-CM | POA: Diagnosis not present

## 2023-09-21 DIAGNOSIS — I4819 Other persistent atrial fibrillation: Secondary | ICD-10-CM

## 2023-09-21 HISTORY — PX: CARDIOVERSION: SHX1299

## 2023-09-21 HISTORY — PX: TEE WITHOUT CARDIOVERSION: SHX5443

## 2023-09-21 LAB — MULTIPLE MYELOMA PANEL, SERUM
Albumin SerPl Elph-Mcnc: 3 g/dL (ref 2.9–4.4)
Albumin/Glob SerPl: 1.1 (ref 0.7–1.7)
Alpha 1: 0.4 g/dL (ref 0.0–0.4)
Alpha2 Glob SerPl Elph-Mcnc: 0.7 g/dL (ref 0.4–1.0)
B-Globulin SerPl Elph-Mcnc: 1 g/dL (ref 0.7–1.3)
Gamma Glob SerPl Elph-Mcnc: 0.9 g/dL (ref 0.4–1.8)
Globulin, Total: 3 g/dL (ref 2.2–3.9)
IgA: 306 mg/dL (ref 61–437)
IgG (Immunoglobin G), Serum: 1040 mg/dL (ref 603–1613)
IgM (Immunoglobulin M), Srm: 45 mg/dL (ref 15–143)
M Protein SerPl Elph-Mcnc: 0.3 g/dL — ABNORMAL HIGH
Total Protein ELP: 6 g/dL (ref 6.0–8.5)

## 2023-09-21 LAB — BASIC METABOLIC PANEL
Anion gap: 6 (ref 5–15)
BUN: 21 mg/dL (ref 8–23)
CO2: 25 mmol/L (ref 22–32)
Calcium: 8.4 mg/dL — ABNORMAL LOW (ref 8.9–10.3)
Chloride: 105 mmol/L (ref 98–111)
Creatinine, Ser: 0.82 mg/dL (ref 0.61–1.24)
GFR, Estimated: >60 mL/min (ref >60)
Glucose, Bld: 112 mg/dL — ABNORMAL HIGH (ref 70–99)
Potassium: 4.1 mmol/L (ref 3.5–5.1)
Sodium: 136 mmol/L (ref 135–145)

## 2023-09-21 LAB — CBC
HCT: 41.2 % (ref 39.0–52.0)
Hemoglobin: 12.8 g/dL — ABNORMAL LOW (ref 13.0–17.0)
MCH: 28.6 pg (ref 26.0–34.0)
MCHC: 31.1 g/dL (ref 30.0–36.0)
MCV: 92.2 fL (ref 80.0–100.0)
Platelets: 286 10*3/uL (ref 150–400)
RBC: 4.47 MIL/uL (ref 4.22–5.81)
RDW: 17.1 % — ABNORMAL HIGH (ref 11.5–15.5)
WBC: 9.6 10*3/uL (ref 4.0–10.5)
nRBC: 0 % (ref 0.0–0.2)

## 2023-09-21 LAB — FACTOR 5 LEIDEN

## 2023-09-21 LAB — ECHO TEE
AV Mean grad: 4 mm[Hg]
AV Peak grad: 10.1 mm[Hg]
Ao pk vel: 1.59 m/s

## 2023-09-21 LAB — LIPOPROTEIN A (LPA): Lipoprotein (a): 106.5 nmol/L — ABNORMAL HIGH (ref <75.0)

## 2023-09-21 SURGERY — ECHOCARDIOGRAM, TRANSESOPHAGEAL
Anesthesia: General

## 2023-09-21 MED ORDER — SODIUM CHLORIDE 0.9 % IV SOLN: INTRAVENOUS | Status: DC

## 2023-09-21 MED ORDER — PROPOFOL 10 MG/ML IV BOLUS
INTRAVENOUS | Status: DC | PRN
  Administered 2023-09-21 (×2): 30 mg via INTRAVENOUS
  Administered 2023-09-21: 20 mg via INTRAVENOUS
  Administered 2023-09-21: 30 mg via INTRAVENOUS

## 2023-09-21 MED ORDER — BISACODYL 5 MG PO TBEC
5.0000 mg | DELAYED_RELEASE_TABLET | Freq: Every day | ORAL | Status: DC | PRN
  Administered 2023-09-21 – 2023-09-22 (×2): 5 mg via ORAL
  Filled 2023-09-21 (×2): qty 1

## 2023-09-21 MED ORDER — PHENYLEPHRINE HCL (PRESSORS) 10 MG/ML IV SOLN: INTRAVENOUS | Status: DC | PRN

## 2023-09-21 MED ORDER — LIDOCAINE VISCOUS HCL 2 % MT SOLN
OROMUCOSAL | Status: AC
  Filled 2023-09-21: qty 15

## 2023-09-21 MED ORDER — PHENYLEPHRINE 80 MCG/ML (10ML) SYRINGE FOR IV PUSH (FOR BLOOD PRESSURE SUPPORT)
PREFILLED_SYRINGE | INTRAVENOUS | Status: DC | PRN
  Administered 2023-09-21 (×2): 80 ug via INTRAVENOUS

## 2023-09-21 MED ORDER — BUTAMBEN-TETRACAINE-BENZOCAINE 2-2-14 % EX AERO
INHALATION_SPRAY | CUTANEOUS | Status: AC
  Filled 2023-09-21: qty 5

## 2023-09-21 MED ORDER — BUTAMBEN-TETRACAINE-BENZOCAINE 2-2-14 % EX AERO
1.0000 | INHALATION_SPRAY | Freq: Once | CUTANEOUS | Status: AC
  Administered 2023-09-21: 1 via TOPICAL
  Filled 2023-09-21: qty 20

## 2023-09-21 NOTE — Plan of Care (Signed)

## 2023-09-21 NOTE — H&P (View-Only) (Signed)
Advanced Heart Failure Rounding Note  Cardiologist: None  Chief Complaint:  Subjective:   cMRI 2/10 with LVEF 33%, diffuse subendocardial LGE, mod dilated LA/RA, mod dilated RV, normal RV function. Suggestive of amyloid.   K/L ratio WNL. MMP pending  L/RHC 2/11: RA 5, PA 39/12 (22), PCWP 9, Fick CO/CI 3.5/1.9, PVR 3.7, PAPi 5.4. Severe multivessel CAD. Severely reduced cardiac output & index. Normal to low filling pressures. 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 No complaints.   Objective:   Weight Range: 64.4 kg Body mass index is 19.8 kg/m.   Vital Signs:   Temp:  [97.8 F (36.6 C)-99.4 F (37.4 C)] 98.7 F (37.1 C) (02/13 0738) Pulse Rate:  [63-87] 87 (02/13 0738) Resp:  [16-18] 16 (02/13 0738) BP: (102-139)/(52-76) 139/68 (02/13 0738) SpO2:  [97 %-100 %] 98 % (02/13 0738) Weight:  [64.4 kg] 64.4 kg (02/13 0738) Last BM Date : 09/17/23  Weight change: Filed Weights   09/20/23 0502 09/21/23 0545 09/21/23 0738  Weight: 63.1 kg 64.4 kg 64.4 kg    Intake/Output:   Intake/Output Summary (Last 24 hours) at 09/21/2023 0748 Last data filed at 09/21/2023 8295 Gross per 24 hour  Intake 254.08 ml  Output 1350 ml  Net -1095.92 ml    Physical Exam    General:  elderly appearing.  No respiratory difficulty HEENT: normal Neck: supple. JVD ~6 cm. Carotids 2+ bilat; no bruits. No lymphadenopathy or thyromegaly appreciated. Cor: PMI nondisplaced. Regular rate & irregular rhythm. No rubs, gallops or murmurs. Lungs: clear Abdomen: soft, nontender, nondistended. No hepatosplenomegaly. No bruits or masses. Good bowel sounds. Extremities: no cyanosis, clubbing, rash, edema  Neuro: alert & oriented x 3, cranial nerves grossly intact. moves all 4 extremities w/o difficulty. Affect pleasant.   Telemetry   Atrial flutter with PVCs 70s (Personally reviewed)    EKG    No new EKG to review  Labs    CBC Recent Labs    09/20/23 0415  09/21/23 0433  WBC 8.5 9.6  HGB 12.5* 12.8*  HCT 39.1 41.2  MCV 89.7 92.2  PLT 278 286   Basic Metabolic Panel Recent Labs    62/13/08 0415 09/21/23 0433  NA 135 136  K 4.0 4.1  CL 103 105  CO2 22 25  GLUCOSE 135* 112*  BUN 23 21  CREATININE 0.83 0.82  CALCIUM 8.5* 8.4*   Liver Function Tests Recent Labs    09/18/23 1536  BILITOT 1.3*   No results for input(s): "LIPASE", "AMYLASE" in the last 72 hours. Cardiac Enzymes No results for input(s): "CKTOTAL", "CKMB", "CKMBINDEX", "TROPONINI" in the last 72 hours.  BNP: BNP (last 3 results) Recent Labs    09/15/23 1044  BNP 707.9*    ProBNP (last 3 results) No results for input(s): "PROBNP" in the last 8760 hours.   D-Dimer No results for input(s): "DDIMER" in the last 72 hours. Hemoglobin A1C No results for input(s): "HGBA1C" in the last 72 hours. Fasting Lipid Panel No results for input(s): "CHOL", "HDL", "LDLCALC", "TRIG", "CHOLHDL", "LDLDIRECT" in the last 72 hours. Thyroid Function Tests No results for input(s): "TSH", "T4TOTAL", "T3FREE", "THYROIDAB" in the last 72 hours.  Invalid input(s): "FREET3"  Other results:   Imaging    No results found.    Medications:     Scheduled Medications:  [MAR Hold] amiodarone  200 mg Oral Daily   [MAR Hold] apixaban  10 mg Oral BID   Followed by   [  MAR Hold] apixaban  5 mg Oral BID   [MAR Hold] aspirin EC  81 mg Oral Daily   [MAR Hold] atorvastatin  80 mg Oral Daily   [MAR Hold] dapagliflozin propanediol  10 mg Oral Daily   [MAR Hold] metoprolol succinate  25 mg Oral Daily   [MAR Hold] polyethylene glycol  17 g Oral Daily   [MAR Hold] sacubitril-valsartan  1 tablet Oral BID   [MAR Hold] sodium chloride flush  3 mL Intravenous Q12H   [MAR Hold] sodium chloride flush  3 mL Intravenous Q12H   [MAR Hold] spironolactone  25 mg Oral Daily    Infusions:  sodium chloride 20 mL/hr at 09/21/23 0729    PRN Medications: [MAR Hold] acetaminophen, [MAR Hold]  acetaminophen, [MAR Hold] HYDROcodone-acetaminophen, [MAR Hold] hydrocortisone cream, [MAR Hold] ondansetron (ZOFRAN) IV, [MAR Hold] ondansetron (ZOFRAN) IV, [MAR Hold] sodium chloride flush, [MAR Hold] sodium chloride flush    Patient Profile   Amil Bouwman is a 83 y.o. AAM with arthritis and prostate CA on hormonal therapy. He has no previous cardiac history. Admitted with new onset CHF and concerns for cardiac amyloid.   Assessment/Plan   1. Acute HFrEF:  - New onset - Echo this admit EF 35-40% global HK moderate LVH. 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 ? Tachy-mediated (AFL on admit) vs infiltrative process vs HTN CM vs frequent PVCs. Clinical suspicion high for potential amyloid.  - cMRI yesterday with LVEF 33%, diffuse subendocardial LGE, mod dilated LA/RA, mod dilated RV, normal RV function. Consider amyloid.  - Volume looks good. Does not currently need a diuretic -  Continue Entresto 24-26 mg BID  - Continue spiro 25 mg daily - Continue farxiga 10 mg daily - Continue Toprol XL 25 daily - L/RHC 2/11: RA 5, PA 39/12 (22), PCWP 9, Fick CO/CI 3.5/1.9, PVR 3.7, PAPi 5.4. Severe multivessel CAD. Severely reduced cardiac output & index. Normal to low filling pressures. 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 K/L ratio WNL  2. CAD with elevated high-sensitivity troponin:  - Has coronary calcifications on CT. No chest pain - Initial high-sensitivity troponin 44 with delta troponin of 50 - LHC 2/12  - Denies CP - Continue ASA/statin  3. Atrial flutter with slow VR:  - Transition heparin>Eliquis 5 mg BID - Plan TEE/DC-CV prior to d/c  - Continue amiodarone 200 mg daily. Watch for increasing block   4. Frequent PVCs - Continue amiodarone   5. Aortic stenosis/mitral regurgitation: - AS is mild to moderate   6. Hypokalemia: - resolved - Continue spiro    7. 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 Hypercoag w/u   8. History of GI bleed secondary to hemorrhoids: -Denies current bleeding -Monitor Hgb with transition to eliquis  Continue working with PT/OT  Length of Stay: 6  Arvilla Meres, MD  09/21/2023, 7:48 AM  Advanced Heart Failure Team Pager [phone] (M-F; 7a - 5p)  Please contact [facility] for night-coverage after hours (5p -7a ) and weekends on [email or web address]  Patient seen and examined with the above-signed Advanced Practice Provider and/or Housestaff. I personally reviewed laboratory data, imaging studies and relevant notes. I independently examined the patient and formulated the important aspects of the plan. I have edited the note to reflect any of my changes or salient points. I have personally  discussed the plan with the patient and/or family.  Denies CP or SOB. Remains in AFL. Still having PVCs. He is NPO for TEE/DC-CV.  Has not had Eliquis yet this am  General:Elderly male No resp difficulty HEENT: normal Neck: supple. no JVD. Carotids 2+ bilat; no bruits. No lymphadenopathy or thryomegaly appreciated. Cor: Irregular rate & rhythm. 2/6 AS Lungs: clear Abdomen: soft, nontender, nondistended. No hepatosplenomegaly. No bruits or masses. Good bowel sounds. Extremities: no cyanosis, clubbing, rash, edema Neuro: alert & orientedx3, cranial nerves grossly intact. moves all 4 extremities w/o difficulty. Affect pleasant  He has significant 3v CAD as well as significant infiltrative CM accompanied by moderate LV dysfunction, AFL and mild to moderate AS.   He is not candidate for CBG given age, fraility and memory issues.   Will plan TEE/DC-CV today to hopefully improve CO (watch for bradycardia/pauses) Continue apixaban.   Suspect PVCs may also be contributing to LV dysfunction. Will continue low-dose amio for now.   Will need PYP/genetic testing as an  outpatient and will start TTR stabilizer as appropriate.   Will stop Toprol for now to make room for amio   Arvilla Meres, MD  7:58 AM

## 2023-09-21 NOTE — Progress Notes (Addendum)
CCMD called to report that patient's heart rhythm is in a-flutter with frequent PVCs. Dr. Arnetha Courser made aware. Per Dr. Nelson Chimes "that's his chronic condition".

## 2023-09-21 NOTE — Progress Notes (Addendum)
Progress Note   Patient: Keith Bates:[id number] DOB: Aug 10, 1940 DOA: 09/15/2023     6 DOS: the patient was seen and examined on 09/21/2023   Brief hospital course: Keith Bates is a 83 y.o. male with medical history significant of internal hemorrhoids, prostate cancer on hormone manipulation, presented with worsening of exertional dyspnea and leg swelling.   Patient is admitted to the hospitalist service for further management evaluation of acute CHF.  Echo showed EF 35-40%, global hypokinesis. Cardiology evaluated him. Patient is found to have right leg DVT and started on heparin drip as per pharmacy protocol.  Patient is started on metoprolol, amiodarone therapy.  Cardiac MRI showed EF 33%, diffuse endocardial gadolinium enhancement possible amyloid.  Heart failure team advised right and left heart cath, possible cardioversion prior to discharge.  2/12: Hemodynamically stable, clinically appears euvolemic.  Had a bed offer at [facility], heart failure team to decide about DCCV so we can plan discharge.  Cardiac cath with multivessel advanced disease,  no intervention was done, current plan is for medical management.  2/13: Patient was taken for DCCV but after TEE decided not to proceed with.  Palliative care was also consulted as recommended by advanced heart failure team.  Patient has advanced heart failure with amyloidosis.  Had a lengthy discussion with patient who seems quite nice and understanding, daughter at bedside and wife on phone.  Daughter keep interrupting and becoming quite rude and conflicting over talk.  Tried explaining multiple times but seems like she herself has some cognitive impairment and unable to understand.  She was keeps complaining that nobody informed her which is wrong.  I told her myself yesterday that patient will be getting cardioversion today, but she keeps saying that nobody told me.  Wife requested to wait discharge until she is back from her by, she is  flying back today. Patient had insurance authorization to go to rehab tomorrow. Please see Dr. Prescott Gum note for further detail. Patient wants to remain full code and full scope of care at this time.  Assessment and Plan: Acute systolic CHF  Echo shows EF 35-40%, global hypokinesis. Stopped IV Lasix 40 mg as he diuresed well and is euvolemic. Cardiology follow-up appreciated. Seen by Heart failure team. cMRI showed LVEF 33%, diffuse subendocardial gadolinium enhancement, ? Amyloid.  Cardiac cath with multivessel severe disease, no intervention done, need aggressive medical management Continue beta-blocker, ARB, farxiga, aldactone. Daily weights, strict I and Os, monitor urine output. Monitor electrolytes, renal function.   A flutter, rate controlled Continue heparin drip as per pharmacy protocol. Continue metoprolol therapy. He is on amiodarone 200 daily to decease PVC burden. DCCV was canceled after TEE today Continue telemetry monitoring. Watch for hemorrhoidal bleeding.  Acute right leg DVT: CTA chest negative for PE. Ultrasound shows of extensive right lower extremity DVT. Heparin infusion is being switched with Eliquis Monitor aptt, bleeding risk explained.  Hypokalemia- K improved. Stopped oral Kcl supplements. Monitor daily electrolytes.   CAD with elevated troponin Continue heparin drip per pharmacy protocol. Cardiology plan to do right and left heart cath today. I discussed with patient's daughter, son at bedside regarding the current work up and plan for heart cath. Family upset about miscommunication as [name] seems forgetful. I advised family to let nurse know of any questions or concerns so MD can talk and clarify.  Cardiology team at bedside discussed in detail about the procedure. Consent signed for cath procedure.  Hypertension Continue metoprolol, lisinopril.  Hyperlipidemia- Continue atorvastatin.  History of prostate cancer Stable, gets hormonal  injection every 6 months Increased bili (fractionated) noted. Will follow.  Out of bed to chair. Incentive spirometry. Nursing supportive care. Fall, aspiration precautions. DVT prophylaxis   Code Status: Full Code  Subjective: Patient was lying comfortably, denies any chest pain or shortness of breath.  Daughter at bedside and wife on phone.  Spent a lot of time explaining which does not seem working with daughter.  Physical Exam: Vitals:   09/21/23 0900 09/21/23 0915 09/21/23 0917 09/21/23 1256  BP: 115/85 (!) 153/107 109/69 112/71  Pulse: 85 68  97  Resp: 19 17  16   Temp:    98.3 F (36.8 C)  TempSrc:    Oral  SpO2: 94% 99%  95%  Weight:      Height:       General.  Frail elderly man, in no acute distress. Pulmonary.  Lungs clear bilaterally, normal respiratory effort. CV.  Regular rate and rhythm, no JVD, rub or murmur. Abdomen.  Soft, nontender, nondistended, BS positive. CNS.  Alert and oriented .  No focal neurologic deficit. Extremities.  No edema, no cyanosis, pulses intact and symmetrical. Psychiatry.  Judgment and insight appears normal.   Data Reviewed:      Latest Ref Rng & Units 09/21/2023    4:33 AM 09/20/2023    4:15 AM 09/19/2023    5:29 PM  CBC  WBC 4.0 - 10.5 K/uL 9.6  8.5    Hemoglobin 13.0 - 17.0 g/dL 14.7  82.9  56.2   Hematocrit 39.0 - 52.0 % 41.2  39.1  39.0   Platelets 150 - 400 K/uL 286  278        Latest Ref Rng & Units 09/21/2023    4:33 AM 09/20/2023    4:15 AM 09/19/2023    5:29 PM  BMP  Glucose 70 - 99 mg/dL 130  865    BUN 8 - 23 mg/dL 21  23    Creatinine 7.84 - 1.24 mg/dL 6.96  2.95    Sodium 284 - 145 mmol/L 136  135  136   Potassium 3.5 - 5.1 mmol/L 4.1  4.0  4.0   Chloride 98 - 111 mmol/L 105  103    CO2 22 - 32 mmol/L 25  22    Calcium 8.9 - 10.3 mg/dL 8.4  8.5     CARDIAC CATHETERIZATION Result Date: 09/19/2023 HEMODYNAMICS: RA:   5 mmHg (mean) RV:   43/5 mmHg PA:   39/12 mmHg (22 mean) PCWP:  9 mmHg (mean)    Estimated  Fick CO/CI   3.5 L/min, 1.9 L/min/m2    TPG    13  mmHg     PVR     3.7 Wood Units PAPi      5.4 Coronary angiography: LM: Normal caliber left main with 30%-40% diffuse calcific disease as it bifurcates into the LAD & LCX LAD: Moderate caliber vessel that tapers off as it approaches the LV apex with  mild diffuse disease. There is a focal ectatic 80-90% lesion at the proximal third of the LAD. Lcx: Codominant vessel. The native AV circumflex has a focal 80% stenosis at the take off of a large OM1/L-PDA which goes which multiple tandem 80% lesions a the proximal and mid third of the vessel. RCA: Small caliber diffusely diseased vessel that gives off a small R-PDA. There are multiple tandem calcific 80-90% lesions at the proximal and mid third of the vessel. IMPRESSION: Severe multivessel CAD  Severely reduced cardiac output & index Normal to low filling pressures. 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  Planned Discharge Destination: SNF  DVT prophylaxis.  Eliquis Time spent: 50 minutes  This record has been created using Conservation officer, historic buildings. Errors have been sought and corrected,but may not always be located. Such creation errors do not reflect on the standard of care.   Author: Arnetha Courser, MD 09/21/2023 3:52 PM Secure chat 7am to 7pm For on call review [email or web address].

## 2023-09-21 NOTE — Progress Notes (Signed)
This RN was called asked to be at bedside with Dr. Gala Romney while discussed POC with daughter and wife due to conflict with hospitalist and staff.   Daughter Rinaldo Cloud was abrupt, rude and interrupted Dr. Gala Romney multiple times as he attempted to address the families concerns.  This made it difficult to Dr. Gala Romney to communicate effectively.  He was able to ask the pt about his understanding of the procedures he has had and his heart condition. Pt couldn't provide details but understood that his heart is not working as it should be. Pt was asked if he were to stop breathing or if his heart were to stop would he was staff to do CPR and be intubated.  Without hesitation pt replied "yes but only temporarily".  Dr. Gala Romney continued to further discuss the severity of his heart condition.  Wife is flying in from [place] tomorrow an d plans to meet with Dr. Gala Romney at bedside to further discuss the plan

## 2023-09-21 NOTE — Anesthesia Preprocedure Evaluation (Signed)
Anesthesia Evaluation  Patient identified by MRN, date of birth, ID band Patient awake    Reviewed: Allergy & Precautions, H&P , NPO status , Patient's Chart, lab work & pertinent test results, reviewed documented beta blocker date and time   History of Anesthesia Complications Negative for: history of anesthetic complications  Airway Mallampati: I  TM Distance: >3 FB Neck ROM: full    Dental  (+) Edentulous Upper, Partial Lower, Missing, Dental Advidsory Given   Pulmonary neg pulmonary ROS, former smoker   Pulmonary exam normal breath sounds clear to auscultation       Cardiovascular Exercise Tolerance: Good (-) hypertension(-) angina + CAD and +CHF  (-) Past MI and (-) Cardiac Stents + dysrhythmias Atrial Fibrillation  Rhythm:regular Rate:Normal  ECHO 09/16/23:  1. Left ventricular ejection fraction, by estimation, is 35 to 40%. The left ventricle has moderately decreased function. The left ventricle demonstrates global hypokinesis. There is severe left ventricular hypertrophy of the basal-septal segment. Left ventricular diastolic function could not be evaluated.   2. Right ventricular systolic function is normal. The right ventricular size is normal. There is severely elevated pulmonary artery systolic pressure. The estimated right ventricular systolic pressure is 63.4 mmHg.   3. Left atrial size was severely dilated.   4. Right atrial size was severely dilated.   5. The mitral valve is degenerative. Mild to moderate mitral valve regurgitation. No evidence of mitral stenosis. The mean mitral valve gradient is 1.0 mmHg.   6. Tricuspid valve regurgitation is mild to moderate.   7. The aortic valve is tricuspid. There is moderate calcification of the aortic valve. There is moderate thickening of the aortic valve. Aortic valve regurgitation is not visualized. Mild to moderate aortic valve stenosis. Aortic valve area, by VTI measures 1.46  cm. Aortic valve mean gradient measures 7.0 mmHg. Aortic valve Vmax measures 1.75 m/s. Low SVI of 23 and low DVI of 0.38. 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 The inferior vena cava is dilated in size with <50% respiratory variability, suggesting right atrial pressure of 15 mmHg.    Neuro/Psych negative neurological ROS  negative psych ROS   GI/Hepatic Neg liver ROS,GERD  ,,  Endo/Other  negative endocrine ROS    Renal/GU negative Renal ROS  negative genitourinary   Musculoskeletal   Abdominal   Peds  Hematology negative hematology ROS (+)   Anesthesia Other Findings Past Medical History: No date: Arthritis No date: DDD (degenerative disc disease) No date: Enlarged prostate No date: Environmental allergies No date: GERD (gastroesophageal reflux disease) No date: Hx of ulcer disease No date: Loss of weight No date: Nocturia No date: Prostate cancer (HCC) No date: Unspecified hemorrhoids without mention of complication   Reproductive/Obstetrics negative OB ROS                             Anesthesia Physical Anesthesia Plan  ASA: 2  Anesthesia Plan: General   Post-op Pain Management:    Induction: Intravenous  PONV Risk Score and Plan: 2 and Propofol infusion and TIVA  Airway Management Planned: Natural Airway and Nasal Cannula  Additional Equipment:   Intra-op Plan:   Post-operative Plan:   Informed Consent: I have reviewed the patients History and Physical, chart, labs and discussed the procedure including the risks, benefits and alternatives for the proposed anesthesia with the patient or authorized representative who has indicated his/her understanding and  acceptance.     Dental Advisory Given  Plan Discussed with: Anesthesiologist, CRNA and Surgeon  Anesthesia Plan Comments:         Anesthesia Quick Evaluation

## 2023-09-21 NOTE — Plan of Care (Signed)

## 2023-09-21 NOTE — TOC Progression Note (Signed)
Transition of Care [facility]) - Progression Note    Patient Details  Name: Keith Bates MRN: [id number] Date of Birth: 1940-12-20  [facility] CM/SW Contact  Truddie Hidden, RN Phone Number: 09/21/2023, 12:57 PM  Clinical Narrative:    Sherron Monday with Theodoro Grist from [employer]. Theodoro Grist inquired if patient will be able to admit today.  Per Navi patient is approved from plan auth ID #[id number]  2/14-2/18. MD notified. Attempt to reach Mason at [employer]. No answer. Left a message advising auth approval starts 2/14.     Expected Discharge Plan: Skilled Nursing Facility Barriers to Discharge: Continued Medical Work up  Expected Discharge Plan and Services     Post Acute Care Choice: Skilled Nursing Facility Living arrangements for the past 2 months: Single Family Home                                       Social Determinants of Health (SDOH) Interventions SDOH Screenings   Food Insecurity: No Food Insecurity (09/16/2023)  Housing: Low Risk  (09/18/2023)  Transportation Needs: No Transportation Needs (09/18/2023)  Utilities: Not At Risk (12/30/2022)  Alcohol Screen: Low Risk  (12/30/2022)  Depression (PHQ2-9): Low Risk  (12/30/2022)  Financial Resource Strain: Low Risk  (12/30/2022)  Physical Activity: Insufficiently Active (12/30/2022)  Social Connections: Socially Integrated (09/18/2023)  Stress: No Stress Concern Present (12/30/2022)  Tobacco Use: Medium Risk (09/21/2023)    Readmission Risk Interventions     No data to display

## 2023-09-21 NOTE — Progress Notes (Signed)
*  PRELIMINARY RESULTS* Echocardiogram Echocardiogram Transesophageal has been performed.  Keith Bates 09/21/2023, 8:33 AM

## 2023-09-21 NOTE — Progress Notes (Addendum)
 Advanced Heart Failure Rounding Note  Cardiologist: None  Chief Complaint:  Subjective:   cMRI 2/10 with LVEF 33%, diffuse subendocardial LGE, mod dilated LA/RA, mod dilated RV, normal RV function. Suggestive of amyloid.   K/L ratio WNL. MMP pending  L/RHC 2/11: RA 5, PA 39/12 (22), PCWP 9, Fick CO/CI 3.5/1.9, PVR 3.7, PAPi 5.4. Severe multivessel CAD. Severely reduced cardiac output & index. Normal to low filling pressures. 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 No complaints.   Objective:   Weight Range: 64.4 kg Body mass index is 19.8 kg/m.   Vital Signs:   Temp:  [97.8 F (36.6 C)-99.4 F (37.4 C)] 98.7 F (37.1 C) (02/13 0738) Pulse Rate:  [63-87] 87 (02/13 0738) Resp:  [16-18] 16 (02/13 0738) BP: (102-139)/(52-76) 139/68 (02/13 0738) SpO2:  [97 %-100 %] 98 % (02/13 0738) Weight:  [64.4 kg] 64.4 kg (02/13 0738) Last BM Date : 09/17/23  Weight change: Filed Weights   09/20/23 0502 09/21/23 0545 09/21/23 0738  Weight: 63.1 kg 64.4 kg 64.4 kg    Intake/Output:   Intake/Output Summary (Last 24 hours) at 09/21/2023 0748 Last data filed at 09/21/2023 1610 Gross per 24 hour  Intake 254.08 ml  Output 1350 ml  Net -1095.92 ml    Physical Exam    General:  elderly appearing.  No respiratory difficulty HEENT: normal Neck: supple. JVD ~6 cm. Carotids 2+ bilat; no bruits. No lymphadenopathy or thyromegaly appreciated. Cor: PMI nondisplaced. Regular rate & irregular rhythm. No rubs, gallops or murmurs. Lungs: clear Abdomen: soft, nontender, nondistended. No hepatosplenomegaly. No bruits or masses. Good bowel sounds. Extremities: no cyanosis, clubbing, rash, edema  Neuro: alert & oriented x 3, cranial nerves grossly intact. moves all 4 extremities w/o difficulty. Affect pleasant.   Telemetry   Atrial flutter with PVCs 70s (Personally reviewed)    EKG    No new EKG to review  Labs    CBC Recent Labs    09/20/23 0415  09/21/23 0433  WBC 8.5 9.6  HGB 12.5* 12.8*  HCT 39.1 41.2  MCV 89.7 92.2  PLT 278 286   Basic Metabolic Panel Recent Labs    96/04/54 0415 09/21/23 0433  NA 135 136  K 4.0 4.1  CL 103 105  CO2 22 25  GLUCOSE 135* 112*  BUN 23 21  CREATININE 0.83 0.82  CALCIUM 8.5* 8.4*   Liver Function Tests Recent Labs    09/18/23 1536  BILITOT 1.3*   No results for input(s): "LIPASE", "AMYLASE" in the last 72 hours. Cardiac Enzymes No results for input(s): "CKTOTAL", "CKMB", "CKMBINDEX", "TROPONINI" in the last 72 hours.  BNP: BNP (last 3 results) Recent Labs    09/15/23 1044  BNP 707.9*    ProBNP (last 3 results) No results for input(s): "PROBNP" in the last 8760 hours.   D-Dimer No results for input(s): "DDIMER" in the last 72 hours. Hemoglobin A1C No results for input(s): "HGBA1C" in the last 72 hours. Fasting Lipid Panel No results for input(s): "CHOL", "HDL", "LDLCALC", "TRIG", "CHOLHDL", "LDLDIRECT" in the last 72 hours. Thyroid Function Tests No results for input(s): "TSH", "T4TOTAL", "T3FREE", "THYROIDAB" in the last 72 hours.  Invalid input(s): "FREET3"  Other results:   Imaging    No results found.    Medications:     Scheduled Medications:  [MAR Hold] amiodarone  200 mg Oral Daily   [MAR Hold] apixaban  10 mg Oral BID   Followed by   [  MAR Hold] apixaban  5 mg Oral BID   [MAR Hold] aspirin EC  81 mg Oral Daily   [MAR Hold] atorvastatin  80 mg Oral Daily   [MAR Hold] dapagliflozin propanediol  10 mg Oral Daily   [MAR Hold] metoprolol succinate  25 mg Oral Daily   [MAR Hold] polyethylene glycol  17 g Oral Daily   [MAR Hold] sacubitril-valsartan  1 tablet Oral BID   [MAR Hold] sodium chloride flush  3 mL Intravenous Q12H   [MAR Hold] sodium chloride flush  3 mL Intravenous Q12H   [MAR Hold] spironolactone  25 mg Oral Daily    Infusions:  sodium chloride 20 mL/hr at 09/21/23 0729    PRN Medications: [MAR Hold] acetaminophen, [MAR Hold]  acetaminophen, [MAR Hold] HYDROcodone-acetaminophen, [MAR Hold] hydrocortisone cream, [MAR Hold] ondansetron (ZOFRAN) IV, [MAR Hold] ondansetron (ZOFRAN) IV, [MAR Hold] sodium chloride flush, [MAR Hold] sodium chloride flush    Patient Profile   Keith Bates is a 83 y.o. AAM with arthritis and prostate CA on hormonal therapy. He has no previous cardiac history. Admitted with new onset CHF and concerns for cardiac amyloid.   Assessment/Plan   1. Acute HFrEF:  - New onset - Echo this admit EF 35-40% global HK moderate LVH. 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 ? Tachy-mediated (AFL on admit) vs infiltrative process vs HTN CM vs frequent PVCs. Clinical suspicion high for potential amyloid.  - cMRI yesterday with LVEF 33%, diffuse subendocardial LGE, mod dilated LA/RA, mod dilated RV, normal RV function. Consider amyloid.  - Volume looks good. Does not currently need a diuretic -  Continue Entresto 24-26 mg BID  - Continue spiro 25 mg daily - Continue farxiga 10 mg daily - Continue Toprol XL 25 daily - L/RHC 2/11: RA 5, PA 39/12 (22), PCWP 9, Fick CO/CI 3.5/1.9, PVR 3.7, PAPi 5.4. Severe multivessel CAD. Severely reduced cardiac output & index. Normal to low filling pressures. 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 K/L ratio WNL  2. CAD with elevated high-sensitivity troponin:  - Has coronary calcifications on CT. No chest pain - Initial high-sensitivity troponin 44 with delta troponin of 50 - LHC 2/12  - Denies CP - Continue ASA/statin  3. Atrial flutter with slow VR:  - Transition heparin>Eliquis 5 mg BID - Plan TEE/DC-CV prior to d/c  - Continue amiodarone 200 mg daily. Watch for increasing block   4. Frequent PVCs - Continue amiodarone   5. Aortic stenosis/mitral regurgitation: - AS is mild to moderate   6. Hypokalemia: - resolved - Continue spiro    7. 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 Hypercoag w/u   8. History of GI bleed secondary to hemorrhoids: -Denies current bleeding -Monitor Hgb with transition to eliquis  Continue working with PT/OT  Length of Stay: 6  Arvilla Meres, MD  09/21/2023, 7:48 AM  Advanced Heart Failure Team Pager [phone] (M-F; 7a - 5p)  Please contact [facility] for night-coverage after hours (5p -7a ) and weekends on [email or web address]  Patient seen and examined with the above-signed Advanced Practice Provider and/or Housestaff. I personally reviewed laboratory data, imaging studies and relevant notes. I independently examined the patient and formulated the important aspects of the plan. I have edited the note to reflect any of my changes or salient points. I have personally  discussed the plan with the patient and/or family.  Denies CP or SOB. Remains in AFL. Still having PVCs. He is NPO for TEE/DC-CV.  Has not had Eliquis yet this am  General:Elderly male No resp difficulty HEENT: normal Neck: supple. no JVD. Carotids 2+ bilat; no bruits. No lymphadenopathy or thryomegaly appreciated. Cor: Irregular rate & rhythm. 2/6 AS Lungs: clear Abdomen: soft, nontender, nondistended. No hepatosplenomegaly. No bruits or masses. Good bowel sounds. Extremities: no cyanosis, clubbing, rash, edema Neuro: alert & orientedx3, cranial nerves grossly intact. moves all 4 extremities w/o difficulty. Affect pleasant  He has significant 3v CAD as well as significant infiltrative CM accompanied by moderate LV dysfunction, AFL and mild to moderate AS.   He is not candidate for CBG given age, fraility and memory issues.   Will plan TEE/DC-CV today to hopefully improve CO (watch for bradycardia/pauses) Continue apixaban.   Suspect PVCs may also be contributing to LV dysfunction. Will continue low-dose amio for now.   Will need PYP/genetic testing as an  outpatient and will start TTR stabilizer as appropriate.   Will stop Toprol for now to make room for amio   Arvilla Meres, MD  7:58 AM  Addendum:  TEE today EF 30-35% with mild to moderate low-flow, low gradient AS. 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   I d/w his daughter by phone.   Will get Palliative Care evaluation in light of severe functional decline.   Arvilla Meres, MD  8:36 AM

## 2023-09-21 NOTE — CV Procedure (Signed)
    TRANSESOPHAGEAL ECHOCARDIOGRAM   NAME:  Keith Bates   MRN: [id number] DOB:  May 25, 1941   ADMIT DATE: 09/15/2023  INDICATIONS:  Atrial fibrillation/flutter  PROCEDURE:   Informed consent was obtained prior to the procedure. The risks, benefits and alternatives for the procedure were discussed and the patient comprehended these risks.  Risks include, but are not limited to, cough, sore throat, vomiting, nausea, somnolence, esophageal and stomach trauma or perforation, bleeding, low blood pressure, aspiration, pneumonia, infection, trauma to the teeth and death.    After a procedural time-out, the patient was sedated by the CRNA from the anesthesia service. Once an appropriate level of sedation was achieved, the transesophageal probe was inserted in the esophagus and stomach without difficulty and multiple views were obtained.    COMPLICATIONS:    There were no immediate complications.  FINDINGS:  LEFT VENTRICLE: EF = 30-35%. Moderate to severe concentric LVH with global HK  RIGHT VENTRICLE: Mild HK  LEFT ATRIUM: Markedly dilated. Heavy smoke  LEFT ATRIAL APPENDAGE: Heavy smoke with laminated clot in lateral portion  RIGHT ATRIUM: Markedly dilated  AORTIC VALVE:  Trileaflet. Moderately calcified Mild to moderately calcified with mild to moderate LFLG AS. VTI across LVOT 16. AVA by planimetry was 1.47 cm2  MITRAL VALVE:    Normal. Degenerative. Mild MR  TRICUSPID VALVE: Mild prolapse> Mild TR  PULMONIC VALVE: Grossly normal. Trive PI  INTERATRIAL SEPTUM: No PFO or ASD.  PERICARDIUM: No effusion  DESCENDING AORTA: Severe layered plaque with sluggish flow due to low CO     Aydyn Testerman,MD 8:23 AM

## 2023-09-21 NOTE — Progress Notes (Addendum)
   I had family meeting with Mr. Ewart and his family today to update them on his situation and discuss some of their concerns.   His daughter Rinaldo Cloud was at the bedside and his wife was on the phone calling from [place]. She was joined on the phone by a friend of hers who was a Engineer, civil (consulting).   The 2nd floor charge RN accompanied me for the conversation as I had heard that his daughter had had a significant conflict during an earlier meeting with Dr. Nelson Chimes and several other staff members. Throughout the charts there are notes that Rinaldo Cloud has been complaining to the staff that she has not been updated regarding procedures and procedure consents despite clear evidence in the chart that this had been done.   I initially tried to get a better understanding of his baseline mental status from the family but Rinaldo Cloud was evasive and we had difficulty communicating clearly together.   In talking with Mr. Navarrete directly, it was clear that he understood the basics of his heart condition but could not provide details. He was also able to tell me the year, the President and his address (he got the month wrong saying it was January and not February). He responded appropriately to questions throughout the discussion.   I spent 35 minutes reviewing all of Mr. Larouche procedures with him and his family and how concerned I was about his prognosis in the setting of advanced cardiac amyloidosis and 3v CAD.   I then asked Mr. Ballweg and his family who would make decisions in the case of an emergency and Mr. Njoku was able to express clearly that he would want full support including intubation and CPR if needed and he added that he would only want mechanical life support "temporarily" suggesting to me that he was able to make these decisions on his own despite some mild cognitive/memory difficulties and his wife and daughter agreed.  His wife plans on flying into today from her vacation in [place] and will meet me at the bedside to  discuss next steps with him. She agrees that Mr. Ashmore can currently make decisions for himself and she will join him tomorrow to assist with any further decisions that need to be made.  I explained that we have consulted Palliative Care to support decision making and also provide increased care as needed should his trajectory continue to decline. I told them I did not feel that he met Hospice criteria at this point but that could change. They were in agreement with the consult being placed.  Arvilla Meres, MD  3:33 PM

## 2023-09-21 NOTE — Transfer of Care (Signed)
Immediate Anesthesia Transfer of Care Note  Patient: Keith Bates  Procedure(s) Performed: TRANSESOPHAGEAL ECHOCARDIOGRAM (TEE) CARDIOVERSION  Patient Location: Short Stay  Anesthesia Type:General  Level of Consciousness: drowsy  Airway & Oxygen Therapy: Patient Spontanous Breathing and Patient connected to nasal cannula oxygen  Post-op Assessment: Report given to RN and Post -op Vital signs reviewed and stable  Post vital signs: Reviewed and stable  Last Vitals:  Vitals Value Taken Time  BP 102/50 09/21/23 0815  Temp    Pulse 68 09/21/23 0819  Resp 24 09/21/23 0818  SpO2 100 % 09/21/23 0819    Last Pain:  Vitals:   09/21/23 0738  TempSrc: Oral  PainSc: 0-No pain         Complications: No notable events documented.

## 2023-09-21 NOTE — Interval H&P Note (Signed)
History and Physical Interval Note:  09/21/2023 8:00 AM  Keith Bates  has presented today for surgery, with the diagnosis of atrial flutter.  The various methods of treatment have been discussed with the patient and family. After consideration of risks, benefits and other options for treatment, the patient has consented to  Procedure(s) with comments: TRANSESOPHAGEAL ECHOCARDIOGRAM (TEE) (N/A) - +DCCV CARDIOVERSION (N/A) as a surgical intervention.  The patient's history has been reviewed, patient examined, no change in status, stable for surgery.  I have reviewed the patient's chart and labs.  Questions were answered to the patient's satisfaction.     Leanza Shepperson

## 2023-09-21 NOTE — Progress Notes (Signed)
Transport here to take pt down for TEE, report has been called. Pt has no concerns at this time.

## 2023-09-21 NOTE — Progress Notes (Signed)
CCMD called to report patient had a 9 beat run of ventricular tachycardia. Dr. Arnetha Courser made aware. No new orders.

## 2023-09-21 NOTE — Progress Notes (Signed)
EKG completed and in chart for pre TEE.

## 2023-09-22 ENCOUNTER — Other Ambulatory Visit (HOSPITAL_COMMUNITY): Payer: Self-pay

## 2023-09-22 ENCOUNTER — Encounter: Payer: Self-pay | Admitting: Internal Medicine

## 2023-09-22 ENCOUNTER — Telehealth (HOSPITAL_COMMUNITY): Payer: Self-pay | Admitting: Pharmacy Technician

## 2023-09-22 DIAGNOSIS — Z515 Encounter for palliative care: Secondary | ICD-10-CM | POA: Diagnosis not present

## 2023-09-22 DIAGNOSIS — I2510 Atherosclerotic heart disease of native coronary artery without angina pectoris: Secondary | ICD-10-CM

## 2023-09-22 DIAGNOSIS — I5021 Acute systolic (congestive) heart failure: Secondary | ICD-10-CM | POA: Diagnosis not present

## 2023-09-22 DIAGNOSIS — I509 Heart failure, unspecified: Secondary | ICD-10-CM | POA: Diagnosis not present

## 2023-09-22 DIAGNOSIS — E854 Organ-limited amyloidosis: Secondary | ICD-10-CM | POA: Diagnosis not present

## 2023-09-22 DIAGNOSIS — I2584 Coronary atherosclerosis due to calcified coronary lesion: Secondary | ICD-10-CM

## 2023-09-22 LAB — CBC
HCT: 46.2 % (ref 39.0–52.0)
Hemoglobin: 14.2 g/dL (ref 13.0–17.0)
MCH: 28.3 pg (ref 26.0–34.0)
MCHC: 30.7 g/dL (ref 30.0–36.0)
MCV: 92.2 fL (ref 80.0–100.0)
Platelets: 339 10*3/uL (ref 150–400)
RBC: 5.01 MIL/uL (ref 4.22–5.81)
RDW: 16.9 % — ABNORMAL HIGH (ref 11.5–15.5)
WBC: 8.8 10*3/uL (ref 4.0–10.5)
nRBC: 0 % (ref 0.0–0.2)

## 2023-09-22 LAB — BASIC METABOLIC PANEL
Anion gap: 8 (ref 5–15)
BUN: 18 mg/dL (ref 8–23)
CO2: 24 mmol/L (ref 22–32)
Calcium: 8.8 mg/dL — ABNORMAL LOW (ref 8.9–10.3)
Chloride: 103 mmol/L (ref 98–111)
Creatinine, Ser: 0.84 mg/dL (ref 0.61–1.24)
GFR, Estimated: >60 mL/min (ref >60)
Glucose, Bld: 110 mg/dL — ABNORMAL HIGH (ref 70–99)
Potassium: 4.3 mmol/L (ref 3.5–5.1)
Sodium: 135 mmol/L (ref 135–145)

## 2023-09-22 MED ORDER — MEXILETINE HCL 200 MG PO CAPS
200.0000 mg | ORAL_CAPSULE | Freq: Two times a day (BID) | ORAL | Status: DC
  Administered 2023-09-22: 200 mg via ORAL
  Filled 2023-09-22: qty 1

## 2023-09-22 MED ORDER — SPIRONOLACTONE 25 MG PO TABS: 25.0000 mg | ORAL_TABLET | Freq: Every day | ORAL | Status: DC

## 2023-09-22 MED ORDER — ATORVASTATIN CALCIUM 80 MG PO TABS
80.0000 mg | ORAL_TABLET | Freq: Every day | ORAL | Status: DC
Start: 2023-09-23 — End: 2023-10-03

## 2023-09-22 MED ORDER — HYDROCORTISONE 1 % EX CREA: TOPICAL_CREAM | Freq: Three times a day (TID) | CUTANEOUS | Status: DC | PRN

## 2023-09-22 MED ORDER — BISACODYL 5 MG PO TBEC: 5.0000 mg | DELAYED_RELEASE_TABLET | Freq: Every day | ORAL | Status: DC | PRN

## 2023-09-22 MED ORDER — APIXABAN 5 MG PO TABS: ORAL_TABLET | ORAL | Status: DC

## 2023-09-22 MED ORDER — ASPIRIN 81 MG PO TBEC: 81.0000 mg | DELAYED_RELEASE_TABLET | Freq: Every day | ORAL | Status: AC

## 2023-09-22 MED ORDER — MEXILETINE HCL 200 MG PO CAPS: 200.0000 mg | ORAL_CAPSULE | Freq: Two times a day (BID) | ORAL | Status: DC

## 2023-09-22 MED ORDER — DAPAGLIFLOZIN PROPANEDIOL 10 MG PO TABS: 10.0000 mg | ORAL_TABLET | Freq: Every day | ORAL | Status: DC

## 2023-09-22 MED ORDER — SACUBITRIL-VALSARTAN 24-26 MG PO TABS: 1.0000 | ORAL_TABLET | Freq: Two times a day (BID) | ORAL | Status: DC

## 2023-09-22 NOTE — Consult Note (Signed)
 Consultation Note Date: 09/22/2023   Patient Name: Keith Bates  DOB: 1941/03/31  MRN: [id number]  Age / Sex: 83 y.o., male  PCP: Smitty Cords, DO Referring Physician: No att. providers found  Reason for Consultation: Establishing goals of care   HPI/Brief Hospital Course: 83 y.o. male  with past medical history of prostate cancer on hormonal therapy admitted from home on 09/15/2023 with exertional dyspnea and bilateral leg swelling.  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  Visited with Keith Bates at his bedside. He is awake, alert, orientated and able to engage in goals of care conversations. Wife at bedside during time of visit.  Introduced myself as a Publishing rights manager as a member of the palliative care team. Explained palliative medicine is specialized medical care for people living with serious illness. It focuses on providing relief from the symptoms and stress of a serious illness. The goal is to improve quality of life for both the patient and the family.   Keith Bates shares Dr. Gala Romney rounded at bedside earlier. Keith Bates and his wife able to accurately share their understanding of current medical condition and awareness of severity of underlying heart disease. Keith Bates also able to share his understanding heart disease likely to progress and interventions limited.  We discussed patient's current illness and what it means in the larger context of patient's on-going  co-morbidities. Natural disease trajectory and expectations at EOL were discussed.   Keith Bates shares his struggle with accepting new diagnoses as prior to hospitalization he did not have a significant medical history. He is hopeful his life can continue as before being admitted.  We discussed code status and the difference between Full Code and Do Not Resuscitate. Encouraged patient/family to consider DNR/DNI status understanding evidenced based poor outcomes in similar hospitalized patients, as the cause of the arrest is likely associated with chronic/terminal disease rather than a reversible acute cardio-pulmonary event.  Keith Bates shares at this time he wishes to remain Full Code but willing to have ongoing conversations related to his wishes.  We discussed the role of outpatient palliative care and Keith Bates agrees to consult being placed at discharge.  I discussed importance of continued conversations with family/support persons and all members of their medical team regarding overall plan of care and treatment options ensuring decisions are in alignment with patients goals of care.  All questions/concerns addressed. Emotional support provided to patient/family/support persons. PMT will continue to follow and support patient as needed.  Objective: Primary Diagnoses: Present on Admission: **None**   Physical Exam Constitutional:      General: He is not in acute distress.    Appearance: He is not ill-appearing.  Pulmonary:     Effort: Pulmonary effort is normal. No respiratory distress.  Abdominal:     General: Abdomen is flat. There is no distension.  Skin:    General: Skin is warm and dry.  Neurological:     Mental Status: He is alert and oriented to person, place, and time.     Motor: Weakness present.  Psychiatric:        Mood and  Affect: Mood normal.        Thought Content: Thought content normal.     Vital Signs: BP 118/73 (BP Location: Left Arm)   Pulse (!) 108   Temp  (!) 97.5 F (36.4 C) (Oral)   Resp 16   Ht 5\' 11"  (1.803 m)   Wt 65.6 kg   SpO2 96%   BMI 20.17 kg/m  Pain Scale: 0-10 POSS *See Group Information*: 1-Acceptable,Awake and alert Pain Score: 0-No pain  IO: Intake/output summary:  Intake/Output Summary (Last 24 hours) at 09/22/2023 1703 Last data filed at 09/22/2023 1317 Gross per 24 hour  Intake --  Output 1975 ml  Net -1975 ml    LBM: Last BM Date : 09/17/23 Baseline Weight: Weight: 64.4 kg Most recent weight: Weight: 65.6 kg      Assessment and Plan  SUMMARY OF RECOMMENDATIONS   Outpatient palliative care referral at discharge  Palliative Prophylaxis:   Bowel Regimen, Delirium Protocol and Frequent Pain Assessment  Discussed With: Nursing staff   Thank you for this consult and allowing Palliative Medicine to participate in the care of [name]. Palliative medicine will continue to follow and assist as needed.   Time Total: 75 minutes  Time spent includes: Detailed review of medical records (labs, imaging, vital signs), medically appropriate exam (mental status, respiratory, cardiac, skin), discussed with treatment team, counseling and educating patient, family and staff, documenting clinical information, medication management and coordination of care.   Signed by: Leeanne Deed, DNP, AGNP-C Palliative Medicine    Please contact Palliative Medicine Team phone at [phone] for questions and concerns.  For individual provider: See Loretha Stapler

## 2023-09-22 NOTE — Telephone Encounter (Signed)
Patient Product/process development scientist completed.    The patient is insured through [facility]. Patient has [employer], may use a copay card, and/or apply for patient assistance if available.    Ran test claim for mexiletiene 200 mg and the current 30 day co-pay is $31.96.   This test claim was processed through [facility]- copay amounts may vary at other pharmacies due to pharmacy/plan contracts, or as the patient moves through the different stages of their insurance plan.     Roland Earl, CPHT Pharmacy Technician III Certified Patient Advocate [facility] Patient Advocate Team Direct Number: [phone]  Fax: [phone]

## 2023-09-22 NOTE — Progress Notes (Signed)
Advanced Heart Failure Rounding Note  Cardiologist: None  Chief Complaint:  Subjective:   cMRI 2/10 with LVEF 33%, diffuse subendocardial LGE, mod dilated LA/RA, mod dilated RV, normal RV function. Suggestive of amyloid.   K/L ratio WNL. M-spike 0.3  L/RHC 2/11: RA 5, PA 39/12 (22), PCWP 9, Fick CO/CI 3.5/1.9, PVR 3.7, PAPi 5.4. Severe multivessel CAD. Severely reduced cardiac output & index. Normal to low filling pressures. 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 Amio stopped due to risk of pauses.   Feels ok today. Denies CP or SOB. No edema. Wife at bedside  Objective:   Weight Range: 65.6 kg Body mass index is 20.17 kg/m.   Vital Signs:   Temp:  [97.5 F (36.4 C)-98.3 F (36.8 C)] 97.5 F (36.4 C) (02/14 0801) Pulse Rate:  [76-108] 108 (02/14 0801) Resp:  [16-20] 16 (02/14 0801) BP: (109-135)/(66-93) 118/73 (02/14 1159) SpO2:  [94 %-98 %] 96 % (02/14 1159) Weight:  [65.6 kg] 65.6 kg (02/14 0500) Last BM Date : 09/17/23  Weight change: Filed Weights   09/21/23 0545 09/21/23 0738 09/22/23 0500  Weight: 64.4 kg 64.4 kg 65.6 kg    Intake/Output:   Intake/Output Summary (Last 24 hours) at 09/22/2023 1214 Last data filed at 09/22/2023 1155 Gross per 24 hour  Intake 100 ml  Output 2175 ml  Net -2075 ml    Physical Exam    General:  Weak appearing. No resp difficulty HEENT: normal Neck: supple. no JVD. Carotids 2+ bilat; no bruits. No lymphadenopathy or thryomegaly appreciated. Cor: Irregular rate & rhythm. No rubs, gallops or murmurs. Lungs: clear Abdomen: soft, nontender, nondistended. No hepatosplenomegaly. No bruits or masses. Good bowel sounds. Extremities: no cyanosis, clubbing, rash, edema Neuro: alert & orientedx3, cranial nerves grossly intact. moves all 4 extremities w/o difficulty. Affect pleasant   Telemetry    Atrial flutter with with frequent  multifocal PVCs 70s and brief NSVT. No pause. Personally reviewed   Labs    CBC Recent Labs    09/21/23 0433 09/22/23 0624  WBC 9.6 8.8  HGB 12.8* 14.2  HCT 41.2 46.2  MCV 92.2 92.2  PLT 286 339   Basic Metabolic Panel Recent Labs    53/66/44 0433 09/22/23 0624  NA 136 135  K 4.1 4.3  CL 105 103  CO2 25 24  GLUCOSE 112* 110*  BUN 21 18  CREATININE 0.82 0.84  CALCIUM 8.4* 8.8*   Liver Function Tests No results for input(s): "AST", "ALT", "ALKPHOS", "BILITOT", "PROT", "ALBUMIN" in the last 72 hours.  No results for input(s): "LIPASE", "AMYLASE" in the last 72 hours. Cardiac Enzymes No results for input(s): "CKTOTAL", "CKMB", "CKMBINDEX", "TROPONINI" in the last 72 hours.  BNP: BNP (last 3 results) Recent Labs    09/15/23 1044  BNP 707.9*    ProBNP (last 3 results) No results for input(s): "PROBNP" in the last 8760 hours.   D-Dimer No results for input(s): "DDIMER" in the last 72 hours. Hemoglobin A1C No results for input(s): "HGBA1C" in the last 72 hours. Fasting Lipid Panel No results for input(s): "CHOL", "HDL", "LDLCALC", "TRIG", "CHOLHDL", "LDLDIRECT" in the last 72 hours. Thyroid Function Tests No results for input(s): "TSH", "T4TOTAL", "T3FREE", "THYROIDAB" in the last 72 hours.  Invalid input(s): "FREET3"  Other results:   Imaging    No results found.    Medications:  Scheduled Medications:  apixaban  10 mg Oral BID   Followed by   Melene Muller ON 09/27/2023] apixaban  5 mg Oral BID   aspirin EC  81 mg Oral Daily   atorvastatin  80 mg Oral Daily   dapagliflozin propanediol  10 mg Oral Daily   mexiletine  200 mg Oral Q12H   polyethylene glycol  17 g Oral Daily   sacubitril-valsartan  1 tablet Oral BID   sodium chloride flush  3 mL Intravenous Q12H   sodium chloride flush  3 mL Intravenous Q12H   spironolactone  25 mg Oral Daily    Infusions:    PRN Medications: acetaminophen, acetaminophen, bisacodyl,  HYDROcodone-acetaminophen, hydrocortisone cream, ondansetron (ZOFRAN) IV, ondansetron (ZOFRAN) IV, sodium chloride flush, sodium chloride flush    Patient Profile   Keith Bates is a 83 y.o. AAM with arthritis and prostate CA on hormonal therapy. He has no previous cardiac history. Admitted with new onset CHF and concerns for cardiac amyloid.   Assessment/Plan   1. 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 ? Tachy-mediated (AFL on admit) vs infiltrative process vs HTN CM vs frequent PVCs. Clinical suspicion high for potential amyloid.  - cMRI LVEF 33%, diffuse subendocardial LGE, mod dilated LA/RA, mod dilated RV, normal RV function. Consider amyloid.  - Volume ok.  Does not currently need a diuretic -  Continue Entresto 24-26 mg BID  - Continue spiro 25 mg daily - Continue farxiga 10 mg daily - Off toprol with pauses in cath lab - [facility] 2/11: RA 5, PA 39/12 (22), PCWP 9, Fick CO/CI 3.5/1.9, PVR 3.7, PAPi 5.4. Severe multivessel CAD. Severely reduced cardiac output & index. Normal to low filling pressures. 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 CAD with elevated high-sensitivity troponin:  - Has coronary calcifications on CT. No chest pain - Initial high-sensitivity troponin 44 with delta troponin of 50 - LHC 2/12 with 3V CAD. Not CABG candidate. No targets for PCI -> medical therapy - Continue ASA/statin  3. Atrial flutter with slow VR:  - Continue Eliquis 5 mg BID - TEE with LAA clot so no DC-CV. Doubt would hold NSR with degree of LAE - Amio stopped in setting of conduction disease   4. Frequent PVCs/NSVT - amio stopped. Switch to mexilitene    5. Aortic stenosis/mitral regurgitation: - AS is mild to moderate  LF/LG AS   6. Hypokalemia: - resolved - Continue spiro    7. 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 Hypercoag w/u   8. History of GI bleed secondary to hemorrhoids: -Denies current bleeding -Monitor Hgb with transition to eliquis   He has advanced heart disease with cardiac amyloidosis and 3v CAD. Options very limited. Currently stable for d/c from our standpoint. Will arrange for close outpatient f/u. Plan PYP and gentic testing as outpatient to consider possible TTR silencer. I explained to family that this will not reverse his amyloid but can help prevent further amyloid progression. I am very concerned about his trajectory.   D/C cardiac meds  Mexilitene 200 bid Eliquis 5 bid Cleda Daub 25 daily Farxiga 10 daily  Entresto 24/26 bid Atorva 80 daily   Length of Stay: 7  Arvilla Meres, MD  09/22/2023, 12:14 PM  Advanced Heart Failure Team Pager [phone] (M-F; 7a - 5p)  Please contact [facility] for night-coverage after hours (5p -7a ) and weekends on [email or web address]

## 2023-09-22 NOTE — Plan of Care (Signed)

## 2023-09-22 NOTE — Discharge Summary (Signed)
Physician Discharge Summary   Patient: Keith Bates MRN: [id number] DOB: Feb 07, 1941  Admit date:     09/15/2023  Discharge date: 09/22/23  Discharge Physician: Keith Bates   PCP: Keith Cords, DO   Recommendations at discharge:  Follow-up with cardiology  Discharge Diagnoses: Acute systolic CHF  A flutter, rate controlled Acute right leg DVT: Hypokalemia- CAD with elevated troponin Hypertension Hyperlipidemia- History of prostate cancer   Hospital Course: Keith Bates is a 83 y.o. male with medical history significant of internal hemorrhoids, prostate cancer on hormone manipulation, presented with worsening of exertional dyspnea and leg swelling.  Patient is admitted to the hospitalist service for further management evaluation of acute CHF.  Echo showed EF 35-40%, global hypokinesis. Cardiology evaluated him. Patient was found to have right leg DVT and started on heparin drip as per pharmacy protocol transition to Eliquis.  Patient is started on metoprolol, amiodarone therapy.  Cardiac MRI showed EF 33%, diffuse endocardial gadolinium enhancement possible amyloid.  Heart failure team advised right and left heart cath, possible cardioversion.   2/12: Hemodynamically stable, clinically appears euvolemic.  Had a bed offer at [facility], heart failure team to decide about DCCV so we can plan discharge.   Cardiac cath with multivessel advanced disease,  no intervention was done, current plan is for medical management.   2/13: Patient was taken for DCCV but after TEE decided not to proceed with.  Palliative care was also consulted as recommended by advanced heart failure team.  Patient has advanced heart failure with amyloidosis.   I discussed with cardiologist as well as patient's wife and patient at bedside and have been cleared for discharge today with current medication and to follow-up as an outpatient..  Consultants: Cardiology Procedures performed: As mentioned  above Disposition: Skilled nursing facility Diet recommendation:  Cardiac diet  DISCHARGE MEDICATION: Allergies as of 09/22/2023       Reactions   Other Rash   Styrofoam - rash and "almost went blind"   Plasticized Base [plastibase] Rash   Penicillins Rash        Medication List     STOP taking these medications    hydrocortisone 25 MG suppository Commonly known as: ANUSOL-HC   meloxicam 7.5 MG tablet Commonly known as: MOBIC   RED YEAST RICE PO       TAKE these medications    acetaminophen 500 MG tablet Commonly known as: TYLENOL Take 1 tablet (500 mg total) by mouth every 6 (six) hours as needed.   apixaban 5 MG Tabs tablet Commonly known as: ELIQUIS Take 2 tablets (10 mg total) by mouth 2 (two) times daily for 6 days, THEN 1 tablet (5 mg total) 2 (two) times daily. Start taking on: September 22, 2023   aspirin EC 81 MG tablet Take 1 tablet (81 mg total) by mouth daily. Swallow whole. Start taking on: September 23, 2023   atorvastatin 80 MG tablet Commonly known as: LIPITOR Take 1 tablet (80 mg total) by mouth daily. Start taking on: September 23, 2023   bisacodyl 5 MG EC tablet Commonly known as: DULCOLAX Take 1 tablet (5 mg total) by mouth daily as needed for moderate constipation.   dapagliflozin propanediol 10 MG Tabs tablet Commonly known as: FARXIGA Take 1 tablet (10 mg total) by mouth daily. Start taking on: September 23, 2023   flunisolide 25 MCG/ACT (0.025%) Soln Commonly known as: NASALIDE Place 2 sprays into the nose 2 (two) times daily.   HYDROcodone-acetaminophen 5-325 MG tablet Commonly  known as: NORCO/VICODIN Take 1 tablet by mouth every 8 (eight) hours as needed for moderate pain.   hydrocortisone cream 1 % Apply topically 3 (three) times daily as needed for itching.   mexiletine 200 MG capsule Commonly known as: MEXITIL Take 1 capsule (200 mg total) by mouth every 12 (twelve) hours.   multivitamin tablet Take 1 tablet by  mouth daily.   sacubitril-valsartan 24-26 MG Commonly known as: ENTRESTO Take 1 tablet by mouth 2 (two) times daily.   spironolactone 25 MG tablet Commonly known as: ALDACTONE Take 1 tablet (25 mg total) by mouth daily. Start taking on: September 23, 2023        Contact information for follow-up providers     [facility]. Go on 09/29/2023.   Specialty: Cardiology Why: Hospital Follow-Up 09/29/23 @ 11:45 Please bring all medications to follow-up appointment [facility], [address] Second Floor Free Valet Parking at the Advertising account planner information: [address] [place] [address] [phone]             Contact information for after-discharge care     Destination     [facility] Preferred SNF .   Service: Skilled Nursing Contact information: [address] [place] [address] [phone]                    Discharge Exam: Keith Bates Weights   09/21/23 0545 09/21/23 0738 09/22/23 0500  Weight: 64.4 kg 64.4 kg 65.6 kg   General.  Frail elderly man, in no acute distress. Pulmonary.  Lungs clear bilaterally, normal respiratory effort. CV.  Regular rate and rhythm, no JVD, rub or murmur. Abdomen.  Soft, nontender, nondistended, BS positive. CNS.  Alert and oriented .  No focal neurologic deficit. Extremities.  No edema, no cyanosis, pulses intact and symmetrical. Psychiatry.  Judgment and insight appears normal.     Condition at discharge: good  The results of significant diagnostics from this hospitalization (including imaging, microbiology, ancillary and laboratory) are listed below for reference.   Imaging Studies: ECHO TEE Result Date: 09/21/2023    TRANSESOPHOGEAL ECHO REPORT   Patient Name:   Keith Bates Date of Exam: 09/21/2023 Medical Rec #:  [id number]    Height:       71.0 in  Accession #:    [id number]   Weight:       142.0 lb Date of Birth:  06-Jun-1941     BSA:          1.823 m Patient Age:    83 years     BP:           117/77 mmHg Patient Gender: M            HR:           77 bpm. Exam Location:  [facility] Procedure: Transesophageal Echo, Limited Color Doppler and Cardiac Doppler (Both            Spectral and Color Flow Doppler were utilized during procedure). Indications:    A-fib  History:        Patient has prior history of Echocardiogram examinations, most                 recent 09/16/2023. CHF and Cardiomyopathy, CAD, Arrythmias:Atrial                 Fibrillation; Risk Factors:Dyslipidemia.  Sonographer:  Dondra Prader RVT RCS Referring Phys: [phone] ALMA L DIAZ PROCEDURE: After discussion of the risks and benefits of a TEE, an informed consent was obtained from the patient. The transesophogeal probe was passed without difficulty through the esophogus of the patient. Local oropharyngeal anesthetic was provided with viscous lidocaine. Sedation performed by different physician. The patient developed no complications during the procedure.  IMPRESSIONS  1. Left ventricular ejection fraction, by estimation, is 25 to 30%. The left ventricle has severely decreased function. The left ventricle demonstrates global hypokinesis. There is moderate concentric left ventricular hypertrophy.  2. Right ventricular systolic function is mildly reduced. The right ventricular size is normal.  3. Left atrial size was severely dilated. A left atrial/left atrial appendage thrombus was detected.  4. Right atrial size was severely dilated.  5. The mitral valve is normal in structure. Mild mitral valve regurgitation.  6. 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 AVA by planimetry 1.47cm2. The aortic valve is tricuspid. There is mild calcification of the aortic valve. Aortic valve regurgitation is not visualized. Mild to moderate aortic valve stenosis. Aortic valve mean gradient measures 4.0 mmHg. Aortic valve  Vmax measures 1.59 m/s. FINDINGS  Left Ventricle: Left ventricular ejection fraction, by estimation, is 25 to 30%. The left ventricle has severely decreased function. The left ventricle demonstrates global hypokinesis. The left ventricular internal cavity size was normal in size. There is moderate concentric left ventricular hypertrophy. Right Ventricle: The right ventricular size is normal. No increase in right ventricular wall thickness. Right ventricular systolic function is mildly reduced. Left Atrium: Left atrial size was severely dilated. Spontaneous echo contrast was present. A left atrial/left atrial appendage thrombus was detected. Right Atrium: Right atrial size was severely dilated. Pericardium: There is no evidence of pericardial effusion. Mitral Valve: The mitral valve is normal in structure. Mild mitral valve regurgitation. Tricuspid Valve: The tricuspid valve is normal in structure. Tricuspid valve regurgitation is mild. 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 AVA by planimetry 1.47cm2. The aortic valve is tricuspid. There is mild calcification of the aortic valve. Aortic valve regurgitation is not visualized. Mild to moderate aortic stenosis is present.  Aortic valve mean gradient measures 4.0 mmHg. Aortic valve peak gradient measures 10.1 mmHg. Pulmonic Valve: The pulmonic valve was grossly normal. Pulmonic valve regurgitation is mild. Aorta: The aortic root is normal in size and structure. IAS/Shunts: No atrial level shunt detected by color flow Doppler. There is no evidence of a patent foramen ovale. There is no evidence of an atrial septal defect. Additional Comments: 3D imaging was not performed. AORTIC VALVE AV Vmax:      159.00 cm/s AV Vmean:     91.700 cm/s AV VTI:       0.168 m AV Peak Grad: 10.1 mmHg AV Mean Grad: 4.0 mmHg Arvilla Meres MD Electronically signed by Arvilla Meres MD Signature Date/Time: 09/21/2023/5:58:07 PM    Final    CARDIAC CATHETERIZATION Result  Date: 09/19/2023 HEMODYNAMICS: RA:   5 mmHg (mean) RV:   43/5 mmHg PA:   39/12 mmHg (22 mean) PCWP:  9 mmHg (mean)    Estimated Fick CO/CI   3.5 L/min, 1.9 L/min/m2    TPG    13  mmHg     PVR     3.7 Wood Units PAPi      5.4 Coronary angiography: LM: Normal caliber left main with 30%-40% diffuse calcific disease as it bifurcates into the LAD & LCX LAD: Moderate caliber vessel that tapers off as it  approaches the LV apex with  mild diffuse disease. There is a focal ectatic 80-90% lesion at the proximal third of the LAD. Lcx: Codominant vessel. The native AV circumflex has a focal 80% stenosis at the take off of a large OM1/L-PDA which goes which multiple tandem 80% lesions a the proximal and mid third of the vessel. RCA: Small caliber diffusely diseased vessel that gives off a small R-PDA. There are multiple tandem calcific 80-90% lesions at the proximal and mid third of the vessel. IMPRESSION: Severe multivessel CAD Severely reduced cardiac output & index Normal to low filling pressures. 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 A dedicated cardiac coil was used. Functional imaging was done using Fiesta sequences. 2,3, and 4 chamber views were done to assess for RWMA's. Modified Simpson's rule using a short axis stack was used to calculate an ejection fraction on a dedicated work [employer]. The patient received 9 cc of Gadavist. After 10 minutes inversion recovery sequences were used to assess for infiltration and scar tissue. Velocity flow mapping performed in the ascending aorta and main pulmonary artery. CONTRAST:  9 cc  of Gadavist FINDINGS: 1. Normal left ventricular size, mild-moderate LV thickness, 1.4 cm in diameter, moderately  reduced LV systolic function (LVEF = 33%). There are no regional wall motion abnormalities. There is diffuse subendocardial late gadolinium enhancement in the left ventricular myocardium. LVEDV: 144 ml LVESV: 97 ml SV: 47 ml CO: 3.1 L/min Myocardial mass: 154g Native LV T1 value 1034 ms (normal <1000 ms) LV ECV 33% (normal <30%) 2. Moderately dilated right ventricular size, normal thickness and systolic function there are no regional wall motion abnormalities. 3.  Moderately dilated left and right atrial size. 4. Normal size of the aortic root, ascending aorta and pulmonary artery. 5. Aortic valve is Trileaflet, mild stenosis per planimetry. Mild tricuspid regurgitation. 6.  Normal pericardium.  No pericardial effusion. IMPRESSION: 1.  Moderately reduced LV systolic function.  LVEF 33%. 2.  Diffuse subendocardial LGE, with no specific vascular territory. 3.  Moderately dilated left and right atrial size. 4.  Moderately dilated RV size, normal RV function. 5.  Consider infiltrative disease such as amyloid. Electronically Signed   By: Debbe Odea M.D.   On: 09/18/2023 17:23   MR CARDIAC VELOCITY FLOW MAP Result Date: 09/18/2023 CLINICAL DATA:  Cardiomyopathy, atrial flutter, eval for infiltrative disease EXAM: CARDIAC MRI TECHNIQUE: The patient was scanned on a 1.5 Tesla Siemens magnet. A dedicated cardiac coil was used. Functional imaging was done using Fiesta sequences. 2,3, and 4 chamber views were done to assess for RWMA's. Modified Simpson's rule using a short axis stack was used to calculate an ejection fraction on a dedicated work [employer], [employer]. The patient received 9 cc of Gadavist. After 10 minutes inversion recovery sequences were used to assess for infiltration and scar tissue. Velocity flow mapping performed in the ascending aorta and main pulmonary artery. CONTRAST:  9 cc  of Gadavist FINDINGS: 1. Normal left ventricular size, mild-moderate LV thickness, 1.4 cm in diameter,  moderately reduced LV systolic function (LVEF = 33%). There are no regional wall motion abnormalities. There is diffuse subendocardial late gadolinium enhancement in the left ventricular myocardium. LVEDV: 144 ml LVESV: 97 ml SV: 47 ml  CO: 3.1 L/min Myocardial mass: 154g Native LV T1 value 1034 ms (normal <1000 ms) LV ECV 33% (normal <30%) 2. Moderately dilated right ventricular size, normal thickness and systolic function there are no regional wall motion abnormalities. 3.  Moderately dilated left and right atrial size. 4. Normal size of the aortic root, ascending aorta and pulmonary artery. 5. Aortic valve is Trileaflet, mild stenosis per planimetry. Mild tricuspid regurgitation. 6.  Normal pericardium.  No pericardial effusion. IMPRESSION: 1.  Moderately reduced LV systolic function.  LVEF 33%. 2.  Diffuse subendocardial LGE, with no specific vascular territory. 3.  Moderately dilated left and right atrial size. 4.  Moderately dilated RV size, normal RV function. 5.  Consider infiltrative disease such as amyloid. Electronically Signed   By: Debbe Odea M.D.   On: 09/18/2023 17:23   US Abdomen Limited RUQ (LIVER/GB) Result Date: 09/18/2023 CLINICAL DATA:  Elevated LFTs EXAM: ULTRASOUND ABDOMEN LIMITED RIGHT UPPER QUADRANT COMPARISON:  CT from 05/22/2023 FINDINGS: Gallbladder: No gallstones or wall thickening visualized. No sonographic Murphy sign noted by sonographer. Previously seen small gallstone is not well visualized. Common bile duct: Diameter: 1.7 mm. Liver: No focal lesion identified. Within normal limits in parenchymal echogenicity. Portal vein is patent on color Doppler imaging with normal direction of blood flow towards the liver. Other: Note is made of a small right-sided pleural effusion. IMPRESSION: Small right pleural effusion. No definitive evidence of cholelithiasis. Electronically Signed   By: Alcide Clever M.D.   On: 09/18/2023 10:42   ECHOCARDIOGRAM COMPLETE Result Date: 09/16/2023     ECHOCARDIOGRAM REPORT   Patient Name:   [facility] [place] Date of Exam: 09/16/2023 Medical Rec #:  [id number]    Height:       71.0 in Accession #:    [id number]   Weight:       142.0 lb Date of Birth:  12-02-1940     BSA:          1.823 m Patient Age:    83 years     BP:           143/93 mmHg Patient Gender: M            HR:           83 bpm. Exam Location:  [facility] Procedure: 2D Echo, Cardiac Doppler and Color Doppler Indications:     CHF-Acute Diastolic I50.31  History:         Patient has no prior history of Echocardiogram examinations.                  Cancer.  Sonographer:     Neysa Bonito Roar Referring Phys:  [id number] Sondra Barges Diagnosing Phys: Armanda Magic MD IMPRESSIONS  1. Left ventricular ejection fraction, by estimation, is 35 to 40%. The left ventricle has moderately decreased function. The left ventricle demonstrates global hypokinesis. There is severe left ventricular hypertrophy of the basal-septal segment. Left ventricular diastolic function could not be evaluated.  2. Right ventricular systolic function is normal. The right ventricular size is normal. There is severely elevated pulmonary artery systolic pressure. The estimated right ventricular systolic pressure is 63.4 mmHg.  3. Left atrial size was severely dilated.  4. Right atrial size was severely dilated.  5. The mitral valve is degenerative. Mild to moderate mitral valve regurgitation. No evidence of mitral stenosis. The mean mitral valve gradient is 1.0 mmHg.  6. Tricuspid valve regurgitation is mild to moderate.  7. The aortic valve is tricuspid. There is  moderate calcification of the aortic valve. There is moderate thickening of the aortic valve. Aortic valve regurgitation is not visualized. Mild to moderate aortic valve stenosis. Aortic valve area, by VTI measures 1.46 cm. Aortic valve mean gradient measures 7.0 mmHg. Aortic valve Vmax measures 1.75 m/s. Low SVI of 23 and low DVI of 0.38. FIndings consistent withat least mild to moderate low flow low  gradient aortic stenosis  8. The inferior vena cava is dilated in size with <50% respiratory variability, suggesting right atrial pressure of 15 mmHg. FINDINGS  Left Ventricle: Left ventricular ejection fraction, by estimation, is 35 to 40%. The left ventricle has moderately decreased function. The left ventricle demonstrates global hypokinesis. The left ventricular internal cavity size was normal in size. There is severe left ventricular hypertrophy of the basal-septal segment. Abnormal (paradoxical) septal motion, consistent with left bundle branch block. Left ventricular diastolic function could not be evaluated due to atrial fibrillation. Left ventricular diastolic function could not be evaluated. Normal left ventricular filling pressure. Right Ventricle: The right ventricular size is normal. No increase in right ventricular wall thickness. Right ventricular systolic function is normal. There is severely elevated pulmonary artery systolic pressure. The tricuspid regurgitant velocity is 3.48 m/s, and with an assumed right atrial pressure of 15 mmHg, the estimated right ventricular systolic pressure is 63.4 mmHg. Left Atrium: Left atrial size was severely dilated. Right Atrium: Right atrial size was severely dilated. Pericardium: There is no evidence of pericardial effusion. Mitral Valve: The mitral valve is degenerative in appearance. There is mild calcification of the mitral valve leaflet(s). Mild to moderate mitral annular calcification. Mild to moderate mitral valve regurgitation. No evidence of mitral valve stenosis. MV  peak gradient, 3.7 mmHg. The mean mitral valve gradient is 1.0 mmHg. Tricuspid Valve: The tricuspid valve is normal in structure. Tricuspid valve regurgitation is mild to moderate. No evidence of tricuspid stenosis. Aortic Valve: The aortic valve is tricuspid. There is moderate calcification of the aortic valve. There is moderate thickening of the aortic valve. Aortic valve regurgitation is  not visualized. Mild to moderate aortic stenosis is present. Aortic valve mean gradient measures 7.0 mmHg. Aortic valve peak gradient measures 12.2 mmHg. Aortic valve area, by VTI measures 1.46 cm. Pulmonic Valve: The pulmonic valve was normal in structure. Pulmonic valve regurgitation is mild. No evidence of pulmonic stenosis. Aorta: The aortic root is normal in size and structure. Venous: The inferior vena cava is dilated in size with less than 50% respiratory variability, suggesting right atrial pressure of 15 mmHg. IAS/Shunts: No atrial level shunt detected by color flow Doppler.  LEFT VENTRICLE PLAX 2D LVIDd:         4.90 cm     Diastology LVIDs:         4.00 cm     LV e' medial:    7.29 cm/s LV PW:         1.20 cm     LV E/e' medial:  9.2 LV IVS:        1.60 cm     LV e' lateral:   9.90 cm/s LVOT diam:     2.20 cm     LV E/e' lateral: 6.8 LV SV:         42 LV SV Index:   23 LVOT Area:     3.80 cm  LV Volumes (MOD) LV vol d, MOD A2C: 96.8 ml LV vol d, MOD A4C: 90.2 ml LV vol s, MOD A2C: 59.9 ml LV vol s, MOD  A4C: 56.2 ml LV SV MOD A2C:     36.9 ml LV SV MOD A4C:     90.2 ml LV SV MOD BP:      34.7 ml RIGHT VENTRICLE RV Basal diam:  3.00 cm RV Mid diam:    2.70 cm RV S prime:     11.90 cm/s TAPSE (M-mode): 1.3 cm LEFT ATRIUM              Index        RIGHT ATRIUM           Index LA diam:        5.10 cm  2.80 cm/m   RA Area:     25.70 cm LA Vol (A2C):   156.0 ml 85.58 ml/m  RA Volume:   77.10 ml  42.30 ml/m LA Vol (A4C):   117.0 ml 64.19 ml/m LA Biplane Vol: 137.0 ml 75.16 ml/m  AORTIC VALVE                     PULMONIC VALVE AV Area (Vmax):    1.35 cm      PV Vmax:          1.09 m/s AV Area (Vmean):   1.35 cm      PV Peak grad:     4.8 mmHg AV Area (VTI):     1.46 cm      PR End Diast Vel: 11.16 msec AV Vmax:           175.00 cm/s   RVOT Peak grad:   2 mmHg AV Vmean:          121.000 cm/s AV VTI:            0.287 m AV Peak Grad:      12.2 mmHg AV Mean Grad:      7.0 mmHg LVOT Vmax:         62.30 cm/s  LVOT Vmean:        43.100 cm/s LVOT VTI:          0.110 m LVOT/AV VTI ratio: 0.38  AORTA Ao Root diam: 2.60 cm Ao Asc diam:  3.00 cm MITRAL VALVE               TRICUSPID VALVE MV Area (PHT): 4.04 cm    TR Peak grad:   48.4 mmHg MV Area VTI:   2.31 cm    TR Vmax:        348.00 cm/s MV Peak grad:  3.7 mmHg MV Mean grad:  1.0 mmHg    SHUNTS MV Vmax:       0.97 m/s    Systemic VTI:  0.11 m MV Vmean:      50.8 cm/s   Systemic Diam: 2.20 cm MV Decel Time: 188 msec MV E velocity: 67.10 cm/s MV A velocity: 37.70 cm/s MV E/A ratio:  1.78 Armanda Magic MD Electronically signed by Armanda Magic MD Signature Date/Time: 09/16/2023/3:37:12 PM    Final    US Venous Img Lower Bilateral Result Date: 09/15/2023 CLINICAL DATA:  Bilateral lower extremity edema. EXAM: BILATERAL LOWER EXTREMITY VENOUS DOPPLER ULTRASOUND TECHNIQUE: Gray-scale sonography with graded compression, as well as color Doppler and duplex ultrasound were performed to evaluate the lower extremity deep venous systems from the level of the common femoral vein and including the common femoral, femoral, profunda femoral, popliteal and calf veins including the posterior tibial, peroneal and gastrocnemius veins when visible. The superficial great saphenous vein was  also interrogated. Spectral Doppler was utilized to evaluate flow at rest and with distal augmentation maneuvers in the common femoral, femoral and popliteal veins. COMPARISON:  None Available. FINDINGS: RIGHT LOWER EXTREMITY Common Femoral Vein: Nonocclusive thrombus present. Saphenofemoral Junction: Nonocclusive thrombus at the saphenofemoral junction. Profunda Femoral Vein: Thrombus crosses the right profunda femoral origin but there does appear to be good flow in the sample profunda femoral vein. Femoral Vein: Occlusive thrombus present. Popliteal Vein: Occlusive thrombus present. Calf Veins: Visualized segments of the right posterior tibial and peroneal veins demonstrate thrombus. Superficial Great  Saphenous Vein: No evidence of thrombus. Normal compressibility. Venous Reflux:  None. Other Findings: No evidence of superficial thrombophlebitis or abnormal fluid collection. LEFT LOWER EXTREMITY Common Femoral Vein: No evidence of thrombus. Normal compressibility, respiratory phasicity and response to augmentation. Saphenofemoral Junction: No evidence of thrombus. Normal compressibility and flow on color Doppler imaging. Profunda Femoral Vein: No evidence of thrombus. Normal compressibility and flow on color Doppler imaging. 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 Popliteal Vein: There is some nonocclusive mild mural thrombus in the popliteal vein as well likely representing chronic thrombus based on appearance. Calf Veins: No evidence of thrombus. Normal compressibility and flow on color Doppler imaging. Superficial Great Saphenous Vein: No evidence of thrombus. Normal compressibility. Venous Reflux:  None. Other Findings: No evidence of superficial thrombophlebitis or abnormal fluid collection. IMPRESSION: 1. Extensive right lower extremity deep venous thrombosis. This appears more acute in appearance with nonocclusive thrombus in the common femoral vein, occlusive thrombus in the femoral and popliteal veins and thrombus in the calf veins. 2. 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 Electronically Signed   By: Irish Lack M.D.   On: 09/15/2023 16:21   CT Angio Chest PE W and/or Wo Contrast Result Date: 09/15/2023 CLINICAL DATA:  Pulmonary embolism (PE) suspected, high prob, short of breath for several months, abnormal chest x-ray EXAM: CT ANGIOGRAPHY CHEST WITH CONTRAST TECHNIQUE: Multidetector CT imaging of the chest was performed using the standard protocol during  bolus administration of intravenous contrast. Multiplanar CT image reconstructions and MIPs were obtained to evaluate the vascular anatomy. RADIATION DOSE REDUCTION: This exam was performed according to the departmental dose-optimization program which includes automated exposure control, adjustment of the mA and/or kV according to patient size and/or use of iterative reconstruction technique. CONTRAST:  75mL OMNIPAQUE IOHEXOL 350 MG/ML SOLN COMPARISON:  09/15/2023, 10/02/2006 FINDINGS: Cardiovascular: This is a technically adequate evaluation of the pulmonary vasculature. There is relative decreased enhancement of the posterior branches of the bilateral lower lobe pulmonary arteries, felt to be due to timing of contrast bolus and adjacent atelectasis rather than true pulmonary emboli. There are no discrete filling defects or pulmonary emboli identified within the remaining pulmonary vasculature. The heart is enlarged without pericardial effusion. There is significant reflux of contrast into the hepatic veins, suggesting cardiac dysfunction. Prominent biatrial dilatation. Normal caliber of the thoracic aorta. Atherosclerosis of the aorta and coronary vasculature. Mediastinum/Nodes: Mediastinal lymphadenopathy identified, measuring up to 1.3 cm in the subcarinal station. Segmental air esophagram within the upper thoracic esophagus, of uncertain significance. Thyroid and trachea are unremarkable. Lungs/Pleura: There are small bilateral pleural effusions, right greater than left. There is a rounded area of loculated fluid at the confluence of the major and minor fissures within the right midlung, corresponding to the  rounded density seen on chest x-ray. There is bilateral lower lobe atelectasis, right greater than left. No pneumothorax. Central airways are patent. Upper Abdomen: No acute abnormality. Musculoskeletal: There are no acute displaced fractures. 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 Reconstructed images demonstrate no additional findings. Review of the MIP images confirms the above findings. IMPRESSION: 1. No evidence of pulmonary embolus. Decreased enhancement of the posterior branches of the bilateral lower lobe pulmonary arteries is felt to be due to adjacent atelectasis and flow related phenomenon. 2. Cardiomegaly, with prominent biatrial dilatation. Reflux of contrast into the hepatic veins suggest underlying cardiac dysfunction. Echocardiography may be useful. 3. Bilateral pleural effusions, right greater than left. Loculated fluid at the confluence of the right major and minor fissures corresponds to the rounded density seen on chest x-ray. 4. Dependent areas of compressive atelectasis within the bilateral lower lobes, right greater than left. 5. Aortic Atherosclerosis (ICD10-I70.0). Coronary artery atherosclerosis. Electronically Signed   By: Sharlet Salina M.D.   On: 09/15/2023 15:12   DG Chest 2 View Result Date: 09/15/2023 CLINICAL DATA:  Shortness of breath for several months. EXAM: CHEST - 2 VIEW COMPARISON:  July 28, 2013. FINDINGS: Mild cardiomegaly is noted. Minimal bibasilar subsegmental atelectasis is noted with small pleural effusions. Rounded density is noted laterally in right midlung concerning for possible pulmonary nodule. Severe degenerative changes are seen involving both glenohumeral joints. IMPRESSION: Rounded density noted laterally in right midlung concerning for possible pulmonary nodule or mass. CT scan of the chest with intravenous contrast is recommended. Minimal bibasilar subsegmental atelectasis is noted with small pleural effusions. Electronically Signed   By: Lupita Raider M.D.   On: 09/15/2023 11:43    Microbiology: Results for orders placed or performed in visit on 01/10/22  Urine Culture     Status: None   Collection Time: 01/10/22  9:10 AM    Specimen: Urine  Result Value Ref Range Status   MICRO NUMBER: [id number]  Final   SPECIMEN QUALITY: Adequate  Final   Sample Source URINE  Final   STATUS: FINAL  Final   Result: No Growth  Final    Labs: CBC: Recent Labs  Lab 09/18/23 0543 09/19/23 0157 09/19/23 1723 09/19/23 1728 09/19/23 1729 09/20/23 0415 09/21/23 0433 09/22/23 0624  WBC 10.1 10.3  --   --   --  8.5 9.6 8.8  HGB 12.2* 12.3*   < > 13.6 13.3 12.5* 12.8* 14.2  HCT 37.2* 38.8*   < > 40.0 39.0 39.1 41.2 46.2  MCV 89.0 90.9  --   --   --  89.7 92.2 92.2  PLT 223 242  --   --   --  278 286 339   < > = values in this interval not displayed.   Basic Metabolic Panel: Recent Labs  Lab 09/15/23 1556 09/16/23 0916 09/18/23 0543 09/19/23 0157 09/19/23 1723 09/19/23 1728 09/19/23 1729 09/20/23 0415 09/21/23 0433 09/22/23 0624  NA  --    < > 136 132*   < > 137 136 135 136 135  K  --    < > 4.3 4.0   < > 4.1 4.0 4.0 4.1 4.3  CL  --    < > 102 100  --   --   --  103 105 103  CO2  --    < > 26 21*  --   --   --  22 25 24   GLUCOSE  --    < > 112* 119*  --   --   --  135* 112* 110*  BUN  --    < > 21 22  --   --   --  23 21 18   CREATININE  --    < > 0.83 0.81  --   --   --  0.83 0.82 0.84  CALCIUM  --    < > 8.8* 8.6*  --   --   --  8.5* 8.4* 8.8*  MG 2.1  --   --   --   --   --   --   --   --   --   PHOS 3.6  --   --   --   --   --   --   --   --   --    < > = values in this interval not displayed.   Liver Function Tests: Recent Labs  Lab 09/16/23 0916 09/17/23 1101 09/18/23 1536  AST 32 32  --   ALT 32 36  --   ALKPHOS 74 69  --   BILITOT 2.5* 2.8* 1.3*  PROT 6.5 6.5  --   ALBUMIN 3.5 3.3*  --    CBG: Recent Labs  Lab 09/15/23 1921  GLUCAP 123*    Discharge time spent:  35 minutes.  Signed: Loyce Dys, MD [facility] 09/22/2023

## 2023-09-22 NOTE — Progress Notes (Signed)
Second attempt to call report to [facility]. Once transferred, the line rang for several minutes and then disconnected my call.

## 2023-09-22 NOTE — Progress Notes (Signed)
Third attempt to call report to [facility]. Per staff, there was an emergency at the facility and they can now receive report, however, no one is answering when transferred from main line.

## 2023-09-22 NOTE — Progress Notes (Signed)
Attempted to call report to [facility] but no answer once transferred from main line. Will try again shortly.

## 2023-09-22 NOTE — TOC Transition Note (Signed)
Transition of Care [facility]) - Discharge Note   Patient Details  Name: Keith Bates MRN: [id number] Date of Birth: 1940/12/31  Transition of Care [facility]) CM/SW Contact:  Hetty Ely, RN Phone Number: 09/22/2023, 1:51 PM   Clinical Narrative:  Patient to discharge back to [facility] room 510. RN to call report [phone]. [facility] arranged to transport.     Final next level of care: Skilled Nursing Facility Barriers to Discharge: Barriers Resolved   Patient Goals and CMS Choice            Discharge Placement              Patient chooses bed at: [facility] (Room 510) Patient to be transferred to facility by: [facility] Name of family member notified: Attending spoke with family Patient and family notified of of transfer: 09/22/23  Discharge Plan and Services Additional resources added to the After Visit Summary for       Post Acute Care Choice: Skilled Nursing Facility          DME Arranged: N/A DME Agency: NA       HH Arranged: NA HH Agency: NA        Social Drivers of Health (SDOH) Interventions SDOH Screenings   Food Insecurity: No Food Insecurity (09/16/2023)  Housing: Low Risk  (09/18/2023)  Transportation Needs: No Transportation Needs (09/18/2023)  Utilities: Not At Risk (12/30/2022)  Alcohol Screen: Low Risk  (12/30/2022)  Depression (PHQ2-9): Low Risk  (12/30/2022)  Financial Resource Strain: Low Risk  (12/30/2022)  Physical Activity: Insufficiently Active (12/30/2022)  Social Connections: Socially Integrated (09/18/2023)  Stress: No Stress Concern Present (12/30/2022)  Tobacco Use: Medium Risk (09/21/2023)     Readmission Risk Interventions     No data to display

## 2023-09-25 LAB — PROTHROMBIN GENE MUTATION

## 2023-09-26 NOTE — Anesthesia Postprocedure Evaluation (Signed)
 Anesthesia Post Note  Patient: Almond Fitzgibbon  Procedure(s) Performed: TRANSESOPHAGEAL ECHOCARDIOGRAM (TEE) CARDIOVERSION  Patient location during evaluation: Specials Recovery Anesthesia Type: General Level of consciousness: awake and alert Pain management: pain level controlled Vital Signs Assessment: post-procedure vital signs reviewed and stable Respiratory status: spontaneous breathing, nonlabored ventilation, respiratory function stable and patient connected to nasal cannula oxygen Cardiovascular status: blood pressure returned to baseline and stable Postop Assessment: no apparent nausea or vomiting Anesthetic complications: no   No notable events documented.   Last Vitals:  Vitals:   09/22/23 0801 09/22/23 1159  BP: 134/80 118/73  Pulse: (!) 108   Resp: 16   Temp: (!) 36.4 C   SpO2: 95% 96%    Last Pain:  Vitals:   09/22/23 0801  TempSrc: Oral  PainSc: 0-No pain                 Lenard Simmer

## 2023-09-28 ENCOUNTER — Telehealth: Payer: Self-pay | Admitting: Cardiology

## 2023-09-28 NOTE — Telephone Encounter (Signed)
 Lvm to confirm pt appt for 09/29/23

## 2023-09-29 ENCOUNTER — Ambulatory Visit: Payer: Medicare Other | Attending: Cardiology | Admitting: Cardiology

## 2023-09-29 VITALS — BP 116/79 | HR 65 | Wt 139.0 lb

## 2023-09-29 DIAGNOSIS — E854 Organ-limited amyloidosis: Secondary | ICD-10-CM

## 2023-09-29 DIAGNOSIS — I43 Cardiomyopathy in diseases classified elsewhere: Secondary | ICD-10-CM

## 2023-09-29 NOTE — Progress Notes (Signed)
 ADVANCED HEART FAILURE FOLLOW UP CLINIC NOTE  Referring Physician: Saralyn Pilar *  Primary Care: Smitty Cords, DO Primary Cardiologist:  HPI: Keith Bates is a 83 y.o. male with a PMH of arthritis, prostate CA on hormonal therapy, recent diagnosis of  who presents for follow up of chronic systolic heart failure.      Patient was admitted on 09/15/23 with several months of progressive HF symptoms    In ER he was noted to be hypertensive with a blood pressure of 167/87 with atrial flutter.   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   Patient underwent left heart catheterization that showed severe, multivessel CAD.  In addition, right heart catheterization with normal filling pressures and severely reduced cardiac index.  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     SUBJECTIVE:  Patient overall states that he is doing well.  The swelling in his feet has significantly improved and he denies any current neuropathy or pins and needle sensation in them.  His appetite is improved, and he is having regular bowel movements every 48-72 hours.  He denies any issues with his medications, no syncope or chest pain.  PMH, current medications, allergies, social history, and family history reviewed in epic.  PHYSICAL EXAM: Vitals:   09/29/23 1203  BP: 116/79  Pulse: 65  SpO2: 100%   GENERAL: chronically ill appearing PULM:  Normal work of breathing, clear to auscultation bilaterally. Respirations are unlabored.  CARDIAC:  JVP: Flat          Bradycardic, irregular rhythm. No murmurs, rubs or gallops.  Trace lower extremity edema. Warm and well perfused extremities. ABDOMEN: Soft, non-tender, non-distended. NEUROLOGIC: Patient is oriented x3 with no focal or lateralizing neurologic deficits.     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 Consider amyloid.  -  Continue Entresto 24-26 mg BID  - Continue spiro 25 mg daily - Continue farxiga 10 mg daily - No benefit to metoprolol with bradycardia and amyloid - Euvolemic today - L/RHC 2/11: RA 5, PA 39/12 (22), PCWP 9, Fick CO/CI 3.5/1.9, PVR 3.7, PAPi 5.4. Severe multivessel CAD. - TEE EF 25-30% Severe BAE. + LAA clot - PYP scan and TTR genetics ordered - Therapy based on results, discuss at next visit  2. CAD:  - LHC 2/12 with 3V CAD. Not CABG candidate. No targets for PCI -> medical therapy - Continue ASA/statin  3. Atrial flutter with slow VR:  - Continue Eliquis 5 mg BID - TEE with LAA clot so no DC-CV. Doubt would hold NSR with degree of LAE - Amio stopped in setting of conduction disease   4. Frequent PVCs/NSVT - Continue mexilitine, PVCs occasionally heard on exam today   5. Aortic stenosis/mitral regurgitation: - AS is mild to moderate LF/LG AS  Follow up in 1 month  Clearnce Hasten, MD Advanced Heart Failure Mechanical  Circulatory Support 09/29/23

## 2023-09-29 NOTE — Patient Instructions (Addendum)
 Great to see you today!!!  Medication Changes:  None, continue current medications  Lab Work:  Designer, jewellery has been done, this has to be sent to [place] to be processed and can take 1-2 weeks to get results back.  We will let you know the results.   Testing/Procedures:  You have been ordered a PYP Scan.  This is done at [facility], they will call you to schedule.  When you come for this test please plan to be there 2-3 hours.  They are located at: [address] [place], [place] [address]. ONCE APPROVED BY INSURANCE YOU WILL BE CALLED TO SCHEDULE THIS  Follow-Up in: 1 month    If you have any questions or concerns before your next appointment please send [place] a message through mychart or call our office at [phone] Monday-Friday 8 am-5 pm.   If you have an urgent need after hours on the weekend please call your Primary Cardiologist or the Advanced [facility] Failure Clinic in [place] at [phone].   At the Advanced [facility] Failure Clinic, you and your health needs are our priority. We have a designated team specialized in the treatment of Heart Failure. This Care Team includes your primary Heart Failure Specialized Cardiologist (physician), Advanced Practice Providers (APPs- Physician Assistants and Nurse Practitioners), and Pharmacist who all work together to provide you with the care you need, when you need it.   You may see any of the following providers on your designated Care Team at your next follow up:  Dr. Arvilla Meres Dr. Marca Ancona Dr. Dorthula Nettles Dr. Theresia Bough Tonye Becket, NP Robbie Lis, [place] [address] [place], [place] Brynda Peon, NP [place] Lee, NP Clarisa Kindred, NP Enos Fling, PharmD

## 2023-10-02 ENCOUNTER — Telehealth: Payer: Self-pay

## 2023-10-02 NOTE — Transitions of Care (Post Inpatient/ED Visit) (Signed)
 10/02/2023  Name: Keith Bates MRN: [id number] DOB: 05/08/1941  Today's TOC FU Call Status: Today's TOC FU Call Status:: Successful TOC FU Call Completed TOC FU Call Complete Date: 10/02/23 Patient's Name and Date of Birth confirmed.  Transition Care Management Follow-up Telephone Call Date of Discharge: 09/30/23 Discharge Facility: Other ([facility]) Name of Other ([facility]) Discharge Facility: [facility] Type of Discharge: Inpatient Admission Primary Inpatient Discharge Diagnosis:: embolism How have you been since you were released from the hospital?: Better Any questions or concerns?: No  Items Reviewed: Did you receive and understand the discharge instructions provided?: Yes Medications obtained,verified, and reconciled?: Yes (Medications Reviewed) Any new allergies since your discharge?: No Dietary orders reviewed?: Yes Do you have support at home?: Yes People in Home: spouse  Medications Reviewed Today: Medications Reviewed Today     Reviewed by Karena Addison, LPN (Licensed Practical Nurse) on 10/02/23 at 1659  Med List Status: <None>   Medication Order Taking? Sig Documenting Provider Last Dose Status Informant  acetaminophen (TYLENOL) 500 MG tablet [id number] No Take 1 tablet (500 mg total) by mouth every 6 (six) hours as needed. Smitty Cords, DO Taking Active Self  apixaban (ELIQUIS) 5 MG TABS tablet [id number] No Take 2 tablets (10 mg total) by mouth 2 (two) times daily for 6 days, THEN 1 tablet (5 mg total) 2 (two) times daily. Loyce Dys, MD Taking Active   aspirin EC 81 MG tablet [id number] No Take 1 tablet (81 mg total) by mouth daily. Swallow whole. Loyce Dys, MD Taking Active   atorvastatin (LIPITOR) 80 MG tablet [id number] No Take 1 tablet (80 mg total) by mouth daily. Loyce Dys, MD Taking Active   bisacodyl (DULCOLAX) 5 MG EC tablet [id number] No Take 1 tablet (5 mg total) by mouth daily as needed for moderate constipation. Loyce Dys, MD Taking Active   dapagliflozin propanediol (FARXIGA) 10 MG TABS tablet [id number] No Take 1 tablet (10 mg total) by mouth daily. Loyce Dys, MD Taking Active   flunisolide (NASALIDE) 25 MCG/ACT (0.025%) SOLN [id number] No Place 2 sprays into the nose 2 (two) times daily. Smitty Cords, DO Taking Active Self  HYDROcodone-acetaminophen (NORCO/VICODIN) 5-325 MG tablet [id number] No Take 1 tablet by mouth every 8 (eight) hours as needed for moderate pain. Lorre Munroe, NP Taking Active Self  hydrocortisone cream 1 % [id number] No Apply topically 3 (three) times daily as needed for itching. Loyce Dys, MD Taking Active   mexiletine (MEXITIL) 200 MG capsule [id number] No Take 1 capsule (200 mg total) by mouth every 12 (twelve) hours. Loyce Dys, MD Taking Active   Multiple Vitamin (MULTIVITAMIN) tablet [id number] No Take 1 tablet by mouth daily. [provider] Taking Active Self  sacubitril-valsartan (ENTRESTO) 24-26 MG [id number] No Take 1 tablet by mouth 2 (two) times daily. Loyce Dys, MD Taking Active   spironolactone (ALDACTONE) 25 MG tablet [id number] No Take 1 tablet (25 mg total) by mouth daily. Loyce Dys, MD Taking Active             Home Care and Equipment/Supplies: Were Home Health Services Ordered?: Yes Name of Home Health Agency:: unknown Has Agency set up a time to come to your home?: Yes First Home Health Visit Date: 10/03/23 Any new equipment or medical supplies ordered?: NA  Functional Questionnaire: Do you need assistance with bathing/showering or dressing?: No Do you need assistance with meal preparation?: No Do you need assistance with eating?:  No Do you have difficulty maintaining continence: No Do you need assistance with getting out of bed/getting out of a chair/moving?: No Do you have difficulty managing or taking your medications?: No  Follow up appointments reviewed: PCP Follow-up appointment confirmed?: Yes Date of  PCP follow-up appointment?: 10/03/23 Follow-up Provider: [facility] Follow-up appointment confirmed?: Yes Date of Specialist follow-up appointment?: 09/29/23 Follow-Up Specialty Provider:: Cardio Do you need transportation to your follow-up appointment?: No Do you understand care options if your condition(s) worsen?: Yes-patient verbalized understanding    SIGNATURE Karena Addison, LPN [facility] Nurse Health Advisor Direct Dial [phone]

## 2023-10-03 ENCOUNTER — Ambulatory Visit (INDEPENDENT_AMBULATORY_CARE_PROVIDER_SITE_OTHER): Payer: Medicare Other | Admitting: Family Medicine

## 2023-10-03 VITALS — BP 130/60 | HR 58 | Ht 71.0 in | Wt 142.0 lb

## 2023-10-03 DIAGNOSIS — I4892 Unspecified atrial flutter: Secondary | ICD-10-CM | POA: Diagnosis not present

## 2023-10-03 DIAGNOSIS — I4891 Unspecified atrial fibrillation: Secondary | ICD-10-CM

## 2023-10-03 DIAGNOSIS — I5020 Unspecified systolic (congestive) heart failure: Secondary | ICD-10-CM | POA: Diagnosis not present

## 2023-10-03 DIAGNOSIS — I824Y1 Acute embolism and thrombosis of unspecified deep veins of right proximal lower extremity: Secondary | ICD-10-CM | POA: Diagnosis not present

## 2023-10-03 MED ORDER — ATORVASTATIN CALCIUM 80 MG PO TABS: 80.0000 mg | ORAL_TABLET | Freq: Every day | ORAL | 2 refills | Status: DC

## 2023-10-03 MED ORDER — SACUBITRIL-VALSARTAN 24-26 MG PO TABS: 1.0000 | ORAL_TABLET | Freq: Two times a day (BID) | ORAL | 0 refills | Status: DC

## 2023-10-03 MED ORDER — APIXABAN 5 MG PO TABS: 5.0000 mg | ORAL_TABLET | Freq: Two times a day (BID) | ORAL | 2 refills | Status: DC

## 2023-10-03 MED ORDER — MEXILETINE HCL 200 MG PO CAPS: 200.0000 mg | ORAL_CAPSULE | Freq: Two times a day (BID) | ORAL | 0 refills | Status: DC

## 2023-10-03 MED ORDER — SPIRONOLACTONE 25 MG PO TABS: 25.0000 mg | ORAL_TABLET | Freq: Every day | ORAL | 2 refills | Status: DC

## 2023-10-03 MED ORDER — DAPAGLIFLOZIN PROPANEDIOL 10 MG PO TABS: 10.0000 mg | ORAL_TABLET | Freq: Every day | ORAL | 2 refills | Status: DC

## 2023-10-03 NOTE — Progress Notes (Unsigned)
 Subjective:    Patient ID: Keith Bates, male    DOB: 1940/09/14, 83 y.o.   MRN: [id number]  Keith Bates is a 83 y.o. male presenting on 10/03/2023 for No chief complaint on file.   HPI  HOSPITAL FOLLOW-UP VISIT  Hospital/Location: [facility] Date of Admission: 09/15/23 Date of Discharge: 09/21/22 Admitted to SNF on 2/14 [facility]) Discharged from SNF on 09/28/22 Transitions of care telephone call: Completed 10/02/23 Cleda Clarks LPN  Reason for Admission: Acute Systolic CHF, Acute DVT Right Lower Extremity  - Hospital H&P and Discharge Summary have been reviewed - Patient presents today 11 days after recent hospitalization. Brief summary of recent course, patient had symptoms of dyspnea shortness of breath, leg swelling and went to UC sent to hospital ED, hospitalized,  treated for CHF  ***ECHO 35-40% ***DVT RLE . - Today reports overall has ***done well after discharge. Symptoms of *** ***have resolved ***are much improved ***seem to be worsening again. - New medications on discharge: *** - Changes to current meds on discharge: ***  I have reviewed the discharge medication list, and have reconciled the current and discharge medications today.   Current Outpatient Medications:    apixaban (ELIQUIS) 5 MG TABS tablet, Take 2 tablets (10 mg total) by mouth 2 (two) times daily for 6 days, THEN 1 tablet (5 mg total) 2 (two) times daily., Disp: , Rfl:    aspirin EC 81 MG tablet, Take 1 tablet (81 mg total) by mouth daily. Swallow whole., Disp: , Rfl:    atorvastatin (LIPITOR) 80 MG tablet, Take 1 tablet (80 mg total) by mouth daily., Disp: , Rfl:    dapagliflozin propanediol (FARXIGA) 10 MG TABS tablet, Take 1 tablet (10 mg total) by mouth daily., Disp: , Rfl:    flunisolide (NASALIDE) 25 MCG/ACT (0.025%) SOLN, Place 2 sprays into the nose 2 (two) times daily., Disp: 25 mL, Rfl: 12   fluticasone (FLONASE) 50 MCG/ACT nasal spray, Place into both nostrils daily., Disp: , Rfl:     mexiletine (MEXITIL) 200 MG capsule, Take 1 capsule (200 mg total) by mouth every 12 (twelve) hours., Disp: , Rfl:    sacubitril-valsartan (ENTRESTO) 24-26 MG, Take 1 tablet by mouth 2 (two) times daily., Disp: , Rfl:    spironolactone (ALDACTONE) 25 MG tablet, Take 1 tablet (25 mg total) by mouth daily., Disp: , Rfl:    acetaminophen (TYLENOL) 500 MG tablet, Take 1 tablet (500 mg total) by mouth every 6 (six) hours as needed., Disp: 30 tablet, Rfl: 0   bisacodyl (DULCOLAX) 5 MG EC tablet, Take 1 tablet (5 mg total) by mouth daily as needed for moderate constipation. (Patient not taking: Reported on 10/03/2023), Disp: , Rfl:    HYDROcodone-acetaminophen (NORCO/VICODIN) 5-325 MG tablet, Take 1 tablet by mouth every 8 (eight) hours as needed for moderate pain. (Patient not taking: Reported on 10/03/2023), Disp: 10 tablet, Rfl: 0   hydrocortisone cream 1 %, Apply topically 3 (three) times daily as needed for itching. (Patient not taking: Reported on 10/03/2023), Disp: , Rfl:    Multiple Vitamin (MULTIVITAMIN) tablet, Take 1 tablet by mouth daily. (Patient not taking: Reported on 10/03/2023), Disp: , Rfl:   ------------------------------------------------------------------------- Social History   Tobacco Use   Smoking status: Former    Current packs/day: 0.00    Average packs/day: 1.5 packs/day for 10.0 years (15.0 ttl pk-yrs)    Types: Cigarettes    Start date: 06/06/1962    Quit date: 06/06/1972    Years since quitting: 21.3  Smokeless tobacco: Former  Building services engineer status: Former  Substance Use Topics   Alcohol use: No   Drug use: No    Review of Systems Per HPI unless specifically indicated above     Objective:    BP 130/60   Pulse (!) 58   Ht 5\' 11"  (1.803 m)   Wt 142 lb (64.4 kg)   BMI 19.80 kg/m   Wt Readings from Last 3 Encounters:  10/03/23 142 lb (64.4 kg)  09/29/23 139 lb (63 kg)  09/22/23 144 lb 10 oz (65.6 kg)    Physical Exam    Results for orders placed  or performed during the hospital encounter of 09/15/23  Basic metabolic panel   Collection Time: 09/15/23 10:44 AM  Result Value Ref Range   Sodium 140 135 - 145 mmol/L   Potassium 4.2 3.5 - 5.1 mmol/L   Chloride 107 98 - 111 mmol/L   CO2 19 (L) 22 - 32 mmol/L   Glucose, Bld 104 (H) 70 - 99 mg/dL   BUN 12 8 - 23 mg/dL   Creatinine, Ser 8.29 0.61 - 1.24 mg/dL   Calcium 9.1 8.9 - 56.2 mg/dL   GFR, Estimated >13 >08 mL/min   Anion gap 14 5 - 15  CBC   Collection Time: 09/15/23 10:44 AM  Result Value Ref Range   WBC 7.9 4.0 - 10.5 K/uL   RBC 4.32 4.22 - 5.81 MIL/uL   Hemoglobin 12.5 (L) 13.0 - 17.0 g/dL   HCT 65.7 84.6 - 96.2 %   MCV 92.8 80.0 - 100.0 fL   MCH 28.9 26.0 - 34.0 pg   MCHC 31.2 30.0 - 36.0 g/dL   RDW 95.2 (H) 84.1 - 32.4 %   Platelets 207 150 - 400 K/uL   nRBC 0.0 0.0 - 0.2 %  Brain natriuretic peptide   Collection Time: 09/15/23 10:44 AM  Result Value Ref Range   B Natriuretic Peptide 707.9 (H) 0.0 - 100.0 pg/mL  TSH   Collection Time: 09/15/23 10:44 AM  Result Value Ref Range   TSH 1.804 0.350 - 4.500 uIU/mL  Troponin I (High Sensitivity)   Collection Time: 09/15/23 10:44 AM  Result Value Ref Range   Troponin I (High Sensitivity) 44 (H) <18 ng/L  Magnesium   Collection Time: 09/15/23  3:56 PM  Result Value Ref Range   Magnesium 2.1 1.7 - 2.4 mg/dL  Phosphorus   Collection Time: 09/15/23  3:56 PM  Result Value Ref Range   Phosphorus 3.6 2.5 - 4.6 mg/dL  Troponin I (High Sensitivity)   Collection Time: 09/15/23  3:56 PM  Result Value Ref Range   Troponin I (High Sensitivity) 50 (H) <18 ng/L  CBG monitoring, ED   Collection Time: 09/15/23  7:21 PM  Result Value Ref Range   Glucose-Capillary 123 (H) 70 - 99 mg/dL  Lipid panel   Collection Time: 09/16/23  9:16 AM  Result Value Ref Range   Cholesterol 156 0 - 200 mg/dL   Triglycerides 56 <401 mg/dL   HDL 41 >02 mg/dL   Total CHOL/HDL Ratio 3.8 RATIO   VLDL 11 0 - 40 mg/dL   LDL Cholesterol 725 (H)  0 - 99 mg/dL  Hepatic function panel   Collection Time: 09/16/23  9:16 AM  Result Value Ref Range   Total Protein 6.5 6.5 - 8.1 g/dL   Albumin 3.5 3.5 - 5.0 g/dL   AST 32 15 - 41 U/L   ALT 32 0 -  44 U/L   Alkaline Phosphatase 74 38 - 126 U/L   Total Bilirubin 2.5 (H) 0.0 - 1.2 mg/dL   Bilirubin, Direct 0.3 (H) 0.0 - 0.2 mg/dL   Indirect Bilirubin 2.2 (H) 0.3 - 0.9 mg/dL  Basic metabolic panel   Collection Time: 09/16/23  9:16 AM  Result Value Ref Range   Sodium 139 135 - 145 mmol/L   Potassium 3.7 3.5 - 5.1 mmol/L   Chloride 105 98 - 111 mmol/L   CO2 25 22 - 32 mmol/L   Glucose, Bld 110 (H) 70 - 99 mg/dL   BUN 15 8 - 23 mg/dL   Creatinine, Ser 0.98 0.61 - 1.24 mg/dL   Calcium 8.9 8.9 - 11.9 mg/dL   GFR, Estimated >14 >78 mL/min   Anion gap 9 5 - 15  ECHOCARDIOGRAM COMPLETE   Collection Time: 09/16/23 10:00 AM  Result Value Ref Range   Weight 2,271.62 oz   Height 71 in   BP 143/95 mmHg   Ao pk vel 1.75 m/s   AV Area VTI 1.46 cm2   AR max vel 1.35 cm2   AV Mean grad 7.0 mmHg   AV Peak grad 12.3 mmHg   Single Plane A2C EF 38.1 %   Single Plane A4C EF 37.7 %   Calc EF 35.8 %   S' Lateral 4.00 cm   AV Area mean vel 1.35 cm2   Area-P 1/2 4.04 cm2   MV VTI 2.31 cm2   Est EF 35 - 40%   Urinalysis, Complete w Microscopic -Urine, Random   Collection Time: 09/16/23 11:22 AM  Result Value Ref Range   Color, Urine COLORLESS (A) YELLOW   APPearance CLEAR (A) CLEAR   Specific Gravity, Urine 1.003 (L) 1.005 - 1.030   pH 7.0 5.0 - 8.0   Glucose, UA NEGATIVE NEGATIVE mg/dL   Hgb urine dipstick NEGATIVE NEGATIVE   Bilirubin Urine NEGATIVE NEGATIVE   Ketones, ur NEGATIVE NEGATIVE mg/dL   Protein, ur NEGATIVE NEGATIVE mg/dL   Nitrite NEGATIVE NEGATIVE   Leukocytes,Ua NEGATIVE NEGATIVE   RBC / HPF 0-5 0 - 5 RBC/hpf   WBC, UA 0-5 0 - 5 WBC/hpf   Bacteria, UA NONE SEEN NONE SEEN   Squamous Epithelial / HPF 0 0 - 5 /HPF  APTT   Collection Time: 09/16/23 11:22 AM  Result Value  Ref Range   aPTT 25 24 - 36 seconds  Protime-INR   Collection Time: 09/16/23 11:22 AM  Result Value Ref Range   Prothrombin Time 16.7 (H) 11.4 - 15.2 seconds   INR 1.3 (H) 0.8 - 1.2  Heparin level (unfractionated)   Collection Time: 09/16/23  7:20 PM  Result Value Ref Range   Heparin Unfractionated 0.16 (L) 0.30 - 0.70 IU/mL  Basic metabolic panel   Collection Time: 09/17/23  3:13 AM  Result Value Ref Range   Sodium 134 (L) 135 - 145 mmol/L   Potassium 3.0 (L) 3.5 - 5.1 mmol/L   Chloride 100 98 - 111 mmol/L   CO2 25 22 - 32 mmol/L   Glucose, Bld 138 (H) 70 - 99 mg/dL   BUN 16 8 - 23 mg/dL   Creatinine, Ser 2.95 0.61 - 1.24 mg/dL   Calcium 8.5 (L) 8.9 - 10.3 mg/dL   GFR, Estimated >62 >13 mL/min   Anion gap 9 5 - 15  Heparin level (unfractionated)   Collection Time: 09/17/23  3:13 AM  Result Value Ref Range   Heparin Unfractionated 0.49 0.30 -  0.70 IU/mL  CBC   Collection Time: 09/17/23  3:13 AM  Result Value Ref Range   WBC 10.2 4.0 - 10.5 K/uL   RBC 4.18 (L) 4.22 - 5.81 MIL/uL   Hemoglobin 12.1 (L) 13.0 - 17.0 g/dL   HCT 62.9 (L) 52.8 - 41.3 %   MCV 88.0 80.0 - 100.0 fL   MCH 28.9 26.0 - 34.0 pg   MCHC 32.9 30.0 - 36.0 g/dL   RDW 24.4 (H) 01.0 - 27.2 %   Platelets 215 150 - 400 K/uL   nRBC 0.0 0.0 - 0.2 %  Heparin level (unfractionated)   Collection Time: 09/17/23 11:01 AM  Result Value Ref Range   Heparin Unfractionated 0.43 0.30 - 0.70 IU/mL  Hepatic function panel   Collection Time: 09/17/23 11:01 AM  Result Value Ref Range   Total Protein 6.5 6.5 - 8.1 g/dL   Albumin 3.3 (L) 3.5 - 5.0 g/dL   AST 32 15 - 41 U/L   ALT 36 0 - 44 U/L   Alkaline Phosphatase 69 38 - 126 U/L   Total Bilirubin 2.8 (H) 0.0 - 1.2 mg/dL   Bilirubin, Direct 0.3 (H) 0.0 - 0.2 mg/dL   Indirect Bilirubin 2.5 (H) 0.3 - 0.9 mg/dL  Heparin level (unfractionated)   Collection Time: 09/18/23  5:43 AM  Result Value Ref Range   Heparin Unfractionated 0.45 0.30 - 0.70 IU/mL  Basic metabolic  panel   Collection Time: 09/18/23  5:43 AM  Result Value Ref Range   Sodium 136 135 - 145 mmol/L   Potassium 4.3 3.5 - 5.1 mmol/L   Chloride 102 98 - 111 mmol/L   CO2 26 22 - 32 mmol/L   Glucose, Bld 112 (H) 70 - 99 mg/dL   BUN 21 8 - 23 mg/dL   Creatinine, Ser 5.36 0.61 - 1.24 mg/dL   Calcium 8.8 (L) 8.9 - 10.3 mg/dL   GFR, Estimated >64 >40 mL/min   Anion gap 8 5 - 15  CBC   Collection Time: 09/18/23  5:43 AM  Result Value Ref Range   WBC 10.1 4.0 - 10.5 K/uL   RBC 4.18 (L) 4.22 - 5.81 MIL/uL   Hemoglobin 12.2 (L) 13.0 - 17.0 g/dL   HCT 34.7 (L) 42.5 - 95.6 %   MCV 89.0 80.0 - 100.0 fL   MCH 29.2 26.0 - 34.0 pg   MCHC 32.8 30.0 - 36.0 g/dL   RDW 38.7 (H) 56.4 - 33.2 %   Platelets 223 150 - 400 K/uL   nRBC 0.0 0.0 - 0.2 %  Multiple Myeloma Panel (SPEP&IFE w/QIG)   Collection Time: 09/18/23  9:26 AM  Result Value Ref Range   IgG (Immunoglobin G), Serum 1,040 603 - 1,613 mg/dL   IgA 951 61 - 884 mg/dL   IgM (Immunoglobulin M), Srm 45 15 - 143 mg/dL   Total Protein ELP 6.0 6.0 - 8.5 g/dL   Albumin SerPl Elph-Mcnc 3.0 2.9 - 4.4 g/dL   Alpha 1 0.4 0.0 - 0.4 g/dL   Alpha2 Glob SerPl Elph-Mcnc 0.7 0.4 - 1.0 g/dL   B-Globulin SerPl Elph-Mcnc 1.0 0.7 - 1.3 g/dL   Gamma Glob SerPl Elph-Mcnc 0.9 0.4 - 1.8 g/dL   M Protein SerPl Elph-Mcnc 0.3 (H) Not Observed g/dL   Globulin, Total 3.0 2.2 - 3.9 g/dL   Albumin/Glob SerPl 1.1 0.7 - 1.7   IFE 1 Comment (A)    Please Note Comment   Kappa/lambda light chains   Collection Time:  09/18/23  9:26 AM  Result Value Ref Range   Kappa free light chain 23.6 (H) 3.3 - 19.4 mg/L   Lambda free light chains 21.2 5.7 - 26.3 mg/L   Kappa, lambda light chain ratio 1.11 0.26 - 1.65  Prealbumin   Collection Time: 09/18/23  9:26 AM  Result Value Ref Range   Prealbumin 14 (L) 18 - 38 mg/dL  [id number] i abs, IgG/M/A   Collection Time: 09/18/23  9:26 AM  Result Value Ref Range   Beta-2 Glyco I IgG <9 0 - 20 GPI IgG units    Beta-2-Glycoprotein I IgM <9 0 - 32 GPI IgM units   Beta-2-Glycoprotein I IgA <9 0 - 25 GPI IgA units  Factor 5 leiden   Collection Time: 09/18/23  9:26 AM  Result Value Ref Range   Recommendations-F5LEID: Comment    Reviewed By: Comment   Prothrombin gene mutation   Collection Time: 09/18/23  9:26 AM  Result Value Ref Range   Recommendations-PTGENE: Comment    Reviewed by: Comment   Cardiolipin antibodies, IgG, IgM, IgA   Collection Time: 09/18/23  9:26 AM  Result Value Ref Range   Anticardiolipin IgG <9 0 - 14 GPL U/mL   Anticardiolipin IgM <9 0 - 12 MPL U/mL   Anticardiolipin IgA <9 0 - 11 APL U/mL  Immunofixation, urine   Collection Time: 09/18/23  9:59 AM  Result Value Ref Range   Immunofixation, Urine Comment   Bilirubin, fractionated(tot/dir/indir)   Collection Time: 09/18/23  3:36 PM  Result Value Ref Range   Total Bilirubin 1.3 (H) 0.0 - 1.2 mg/dL   Bilirubin, Direct 0.2 0.0 - 0.2 mg/dL   Indirect Bilirubin 1.1 (H) 0.3 - 0.9 mg/dL  Basic metabolic panel   Collection Time: 09/19/23  1:57 AM  Result Value Ref Range   Sodium 132 (L) 135 - 145 mmol/L   Potassium 4.0 3.5 - 5.1 mmol/L   Chloride 100 98 - 111 mmol/L   CO2 21 (L) 22 - 32 mmol/L   Glucose, Bld 119 (H) 70 - 99 mg/dL   BUN 22 8 - 23 mg/dL   Creatinine, Ser 4.78 0.61 - 1.24 mg/dL   Calcium 8.6 (L) 8.9 - 10.3 mg/dL   GFR, Estimated >29 >56 mL/min   Anion gap 11 5 - 15  CBC   Collection Time: 09/19/23  1:57 AM  Result Value Ref Range   WBC 10.3 4.0 - 10.5 K/uL   RBC 4.27 4.22 - 5.81 MIL/uL   Hemoglobin 12.3 (L) 13.0 - 17.0 g/dL   HCT 21.3 (L) 08.6 - 57.8 %   MCV 90.9 80.0 - 100.0 fL   MCH 28.8 26.0 - 34.0 pg   MCHC 31.7 30.0 - 36.0 g/dL   RDW 46.9 (H) 62.9 - 52.8 %   Platelets 242 150 - 400 K/uL   nRBC 0.0 0.0 - 0.2 %  Heparin level (unfractionated)   Collection Time: 09/19/23  1:57 AM  Result Value Ref Range   Heparin Unfractionated 0.33 0.30 - 0.70 IU/mL  I-STAT 7, (LYTES, BLD GAS, ICA, H+H)    Collection Time: 09/19/23  5:23 PM  Result Value Ref Range   pH, Arterial 7.450 7.35 - 7.45   pCO2 arterial 28.0 (L) 32 - 48 mmHg   pO2, Arterial 77 (L) 83 - 108 mmHg   Bicarbonate 19.5 (L) 20.0 - 28.0 mmol/L   TCO2 20 (L) 22 - 32 mmol/L   O2 Saturation 96 %   Acid-base deficit 3.0 (  H) 0.0 - 2.0 mmol/L   Sodium 137 135 - 145 mmol/L   Potassium 4.1 3.5 - 5.1 mmol/L   Calcium, Ion 1.22 1.15 - 1.40 mmol/L   HCT 39.0 39.0 - 52.0 %   Hemoglobin 13.3 13.0 - 17.0 g/dL   Sample type ARTERIAL   POCT I-Stat EG7   Collection Time: 09/19/23  5:28 PM  Result Value Ref Range   pH, Ven 7.377 7.25 - 7.43   pCO2, Ven 40.4 (L) 44 - 60 mmHg   pO2, Ven 31 (LL) 32 - 45 mmHg   Bicarbonate 23.8 20.0 - 28.0 mmol/L   TCO2 25 22 - 32 mmol/L   O2 Saturation 57 %   Acid-base deficit 1.0 0.0 - 2.0 mmol/L   Sodium 137 135 - 145 mmol/L   Potassium 4.1 3.5 - 5.1 mmol/L   Calcium, Ion 1.24 1.15 - 1.40 mmol/L   HCT 40.0 39.0 - 52.0 %   Hemoglobin 13.6 13.0 - 17.0 g/dL   Sample type VENOUS    Comment NOTIFIED PHYSICIAN   POCT I-Stat EG7   Collection Time: 09/19/23  5:29 PM  Result Value Ref Range   pH, Ven 7.383 7.25 - 7.43   pCO2, Ven 39.8 (L) 44 - 60 mmHg   pO2, Ven 30 (LL) 32 - 45 mmHg   Bicarbonate 23.7 20.0 - 28.0 mmol/L   TCO2 25 22 - 32 mmol/L   O2 Saturation 57 %   Acid-base deficit 1.0 0.0 - 2.0 mmol/L   Sodium 136 135 - 145 mmol/L   Potassium 4.0 3.5 - 5.1 mmol/L   Calcium, Ion 1.23 1.15 - 1.40 mmol/L   HCT 39.0 39.0 - 52.0 %   Hemoglobin 13.3 13.0 - 17.0 g/dL   Sample type VENOUS    Comment NOTIFIED PHYSICIAN   Basic metabolic panel   Collection Time: 09/20/23  4:15 AM  Result Value Ref Range   Sodium 135 135 - 145 mmol/L   Potassium 4.0 3.5 - 5.1 mmol/L   Chloride 103 98 - 111 mmol/L   CO2 22 22 - 32 mmol/L   Glucose, Bld 135 (H) 70 - 99 mg/dL   BUN 23 8 - 23 mg/dL   Creatinine, Ser 2.95 0.61 - 1.24 mg/dL   Calcium 8.5 (L) 8.9 - 10.3 mg/dL   GFR, Estimated >62 >13 mL/min   Anion  gap 10 5 - 15  CBC   Collection Time: 09/20/23  4:15 AM  Result Value Ref Range   WBC 8.5 4.0 - 10.5 K/uL   RBC 4.36 4.22 - 5.81 MIL/uL   Hemoglobin 12.5 (L) 13.0 - 17.0 g/dL   HCT 08.6 57.8 - 46.9 %   MCV 89.7 80.0 - 100.0 fL   MCH 28.7 26.0 - 34.0 pg   MCHC 32.0 30.0 - 36.0 g/dL   RDW 62.9 (H) 52.8 - 41.3 %   Platelets 278 150 - 400 K/uL   nRBC 0.0 0.0 - 0.2 %  Heparin level (unfractionated)   Collection Time: 09/20/23  4:15 AM  Result Value Ref Range   Heparin Unfractionated 0.23 (L) 0.30 - 0.70 IU/mL  Lipoprotein A (LPA)   Collection Time: 09/20/23  4:15 AM  Result Value Ref Range   Lipoprotein (a) 106.5 (H) <75.0 nmol/L  CBC   Collection Time: 09/21/23  4:33 AM  Result Value Ref Range   WBC 9.6 4.0 - 10.5 K/uL   RBC 4.47 4.22 - 5.81 MIL/uL   Hemoglobin 12.8 (L) 13.0 - 17.0 g/dL  HCT 41.2 39.0 - 52.0 %   MCV 92.2 80.0 - 100.0 fL   MCH 28.6 26.0 - 34.0 pg   MCHC 31.1 30.0 - 36.0 g/dL   RDW 40.9 (H) 81.1 - 91.4 %   Platelets 286 150 - 400 K/uL   nRBC 0.0 0.0 - 0.2 %  Basic metabolic panel   Collection Time: 09/21/23  4:33 AM  Result Value Ref Range   Sodium 136 135 - 145 mmol/L   Potassium 4.1 3.5 - 5.1 mmol/L   Chloride 105 98 - 111 mmol/L   CO2 25 22 - 32 mmol/L   Glucose, Bld 112 (H) 70 - 99 mg/dL   BUN 21 8 - 23 mg/dL   Creatinine, Ser 7.82 0.61 - 1.24 mg/dL   Calcium 8.4 (L) 8.9 - 10.3 mg/dL   GFR, Estimated >95 >62 mL/min   Anion gap 6 5 - 15  ECHO TEE   Collection Time: 09/21/23  8:59 AM  Result Value Ref Range   Ao pk vel 1.59 m/s   AV Mean grad 4.0 mmHg   AV Peak grad 10.1 mmHg   Est EF 25 - 30%   CBC   Collection Time: 09/22/23  6:24 AM  Result Value Ref Range   WBC 8.8 4.0 - 10.5 K/uL   RBC 5.01 4.22 - 5.81 MIL/uL   Hemoglobin 14.2 13.0 - 17.0 g/dL   HCT 13.0 86.5 - 78.4 %   MCV 92.2 80.0 - 100.0 fL   MCH 28.3 26.0 - 34.0 pg   MCHC 30.7 30.0 - 36.0 g/dL   RDW 69.6 (H) 29.5 - 28.4 %   Platelets 339 150 - 400 K/uL   nRBC 0.0 0.0 - 0.2 %   Basic metabolic panel   Collection Time: 09/22/23  6:24 AM  Result Value Ref Range   Sodium 135 135 - 145 mmol/L   Potassium 4.3 3.5 - 5.1 mmol/L   Chloride 103 98 - 111 mmol/L   CO2 24 22 - 32 mmol/L   Glucose, Bld 110 (H) 70 - 99 mg/dL   BUN 18 8 - 23 mg/dL   Creatinine, Ser 1.32 0.61 - 1.24 mg/dL   Calcium 8.8 (L) 8.9 - 10.3 mg/dL   GFR, Estimated >44 >01 mL/min   Anion gap 8 5 - 15      Assessment & Plan:   Problem List Items Addressed This Visit   None    No orders of the defined types were placed in this encounter.   Follow up plan: No follow-ups on file.  Saralyn Pilar, DO [facility] 10/03/2023, 2:47 PM

## 2023-10-03 NOTE — Patient Instructions (Addendum)
 Thank you for coming to the office today.  Refilled all medications to [facility]  30 day for all, and some of them I sent additional refills  For the HEART ONLY medications - Entresto (generic) and mexiletine recommend orders from the Cardiologist at upcoming visit.  If sudden worsening - then seek care and evaluation sooner, if chest pain dyspnea, leg swelling.  Diet restrictions avoid excessive fluids and avoid excessive salt intake - goal < 1500 mg or 2000 mg sodium per day  Fluid limit 1.5 to 2 L per day Max, goal to limit to this amount  Please schedule a Follow-up Appointment to: Return if symptoms worsen or fail to improve.  If you have any other questions or concerns, please feel free to call the office or send a message through MyChart. You may also schedule an earlier appointment if necessary.  Additionally, you may be receiving a survey about your experience at our office within a few days to 1 week by e-mail or mail. We value your feedback.  Saralyn Pilar, DO [facility], [place]

## 2023-10-04 ENCOUNTER — Telehealth: Payer: Self-pay

## 2023-10-04 ENCOUNTER — Encounter: Payer: Self-pay | Admitting: Family Medicine

## 2023-10-04 NOTE — Telephone Encounter (Signed)
 Okay to proceed w/ verbal orders  Saralyn Pilar, DO [facility] 10/04/2023, 5:33 PM

## 2023-10-04 NOTE — Telephone Encounter (Signed)
 Copied from CRM [id number]. Topic: Clinical - Home Health Verbal Orders >> Oct 04, 2023  3:58 PM Carlatta H wrote: Caller/Agency: Rosealee Albee Number: [phone] Service Requested: Occupational Therapy Frequency: 1 time a week for 6 weeks Any new concerns about the patient? No Please leave full name and credentials

## 2023-10-05 NOTE — Telephone Encounter (Signed)
 LM for [place] on secure line with verbal order.

## 2023-10-25 ENCOUNTER — Ambulatory Visit: Payer: Medicare Other | Attending: Cardiology | Admitting: Cardiology

## 2023-10-25 ENCOUNTER — Other Ambulatory Visit (HOSPITAL_COMMUNITY): Payer: Self-pay | Admitting: Cardiology

## 2023-10-25 VITALS — BP 130/81 | HR 82 | Wt 144.0 lb

## 2023-10-25 DIAGNOSIS — E854 Organ-limited amyloidosis: Secondary | ICD-10-CM | POA: Diagnosis not present

## 2023-10-25 DIAGNOSIS — I43 Cardiomyopathy in diseases classified elsewhere: Secondary | ICD-10-CM | POA: Diagnosis not present

## 2023-10-25 NOTE — Patient Instructions (Signed)
 There has been no changes to your medications.  Your physician recommends that you schedule a follow-up appointment in: 3 months.  At the [facility], you and your health needs are our priority. As part of our continuing mission to provide you with exceptional heart care, we have created designated Provider Care Teams. These Care Teams include your primary Cardiologist (physician) and Advanced Practice Providers (APPs- Physician Assistants and Nurse Practitioners) who all work together to provide you with the care you need, when you need it.   You may see any of the following providers on your designated Care Team at your next follow up: Dr Arvilla Meres Dr Marca Ancona Dr. Dorthula Nettles Dr. Clearnce Hasten Amy Filbert Schilder, NP Robbie Lis, [place] [facility] [place], [place] Brynda Peon, NP [place] Lee, NP Clarisa Kindred, NP Karle Plumber, PharmD Enos Fling, PharmD   Please be sure to bring in all your medications bottles to every appointment.    Thank you for choosing [facility]

## 2023-10-26 ENCOUNTER — Encounter: Payer: Medicare Other | Admitting: Cardiology

## 2023-10-26 ENCOUNTER — Other Ambulatory Visit: Payer: Self-pay | Admitting: Family Medicine

## 2023-10-26 DIAGNOSIS — I4891 Unspecified atrial fibrillation: Secondary | ICD-10-CM

## 2023-10-27 NOTE — Telephone Encounter (Signed)
 Requested medication (s) are due for refill today: Yes  Requested medication (s) are on the active medication list: Yes  Last refill:  10/03/23 #60, 0 refills   Future visit scheduled: Yes  Notes to clinic:  Unable to refill per protocol, cannot delegate. Pharmacy requesting 90 day supply     Requested Prescriptions  Pending Prescriptions Disp Refills   mexiletine (MEXITIL) 200 MG capsule [Pharmacy Med Name: MEXILETINE 200 MG CAPSULE] 180 capsule 1    Sig: TAKE 1 CAPSULE BY MOUTH TWICE A DAY     Not Delegated - Cardiovascular:  Antiarrhythmic Agents Failed - 10/27/2023 11:48 AM      Failed - This refill cannot be delegated      Failed - Ca in normal range and within 180 days    Calcium  Date Value Ref Range Status  09/22/2023 8.8 (L) 8.9 - 10.3 mg/dL Final   Calcium, Ion  Date Value Ref Range Status  09/19/2023 1.23 1.15 - 1.40 mmol/L Final         Failed - Valid encounter within last 6 months    Recent Outpatient Visits           1 year ago Pain and swelling of right wrist   [facility] [place], Salvadore [place], NP   1 year ago Acute cystitis with hematuria   [place] [facility] [place], [name], DO   2 years ago Annual physical exam   [facility] Smitty Cords, DO   2 years ago Acute cystitis with hematuria   [facility] Smitty Cords, DO   2 years ago Acute right ankle pain   [facility] [place], [name], DO       Future Appointments             In 2 months Althea Charon, [name], DO [facility]            Passed - Mg Level in normal range and within 180 days    Magnesium  Date Value Ref Range Status  09/15/2023 2.1 1.7 - 2.4 mg/dL Final    Comment:    Performed at [facility], [address] [place], [place] [address]         Passed - K in normal  range and within 180 days    Potassium  Date Value Ref Range Status  09/22/2023 4.3 3.5 - 5.1 mmol/L Final         Passed - AST in normal range and within 180 days    AST  Date Value Ref Range Status  09/17/2023 32 15 - 41 U/L Final         Passed - ALT in normal range and within 180 days    ALT  Date Value Ref Range Status  09/17/2023 36 0 - 44 U/L Final         Passed - Patient had ECG in the last 180 days      Passed - Last BP in normal range    BP Readings from Last 1 Encounters:  10/25/23 130/81         Passed - Last Heart Rate in normal range    Pulse Readings from Last 1 Encounters:  10/25/23 82

## 2023-10-28 ENCOUNTER — Other Ambulatory Visit: Payer: Self-pay | Admitting: Family Medicine

## 2023-10-28 DIAGNOSIS — M06031 Rheumatoid arthritis without rheumatoid factor, right wrist: Secondary | ICD-10-CM

## 2023-10-28 DIAGNOSIS — I5020 Unspecified systolic (congestive) heart failure: Secondary | ICD-10-CM

## 2023-10-28 DIAGNOSIS — M150 Primary generalized (osteo)arthritis: Secondary | ICD-10-CM

## 2023-10-29 NOTE — Progress Notes (Signed)
   ADVANCED HEART FAILURE FOLLOW UP CLINIC NOTE  Referring Physician: Saralyn Pilar *  Primary Care: Smitty Cords, DO Primary Cardiologist:  HPI: Keith Bates is a 83 y.o. male with a PMH of arthritis, prostate CA on hormonal therapy, recent diagnosis of  who presents for follow up of chronic systolic heart failure.      Patient was admitted on 09/15/23 with several months of progressive HF symptoms    In ER he was noted to be hypertensive with a blood pressure of 167/87 with atrial flutter.   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   Patient underwent left heart catheterization that showed severe, multivessel CAD.  In addition, right heart catheterization with normal filling pressures and severely reduced cardiac index.  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     SUBJECTIVE:  Patient still doing fairly well, no active complaints. He has been compliant with his medications and is otherwise feeling well. He denies any neuropathy, syncope, chest pain, shortness of breath.   PMH, current medications, allergies, social history, and family history reviewed in epic.  PHYSICAL EXAM: Vitals:   10/25/23 1043  BP: 130/81  Pulse: 82  SpO2: 97%   GENERAL: chronically ill appearing PULM:  Normal work of breathing, clear to auscultation bilaterally. Respirations are unlabored.  CARDIAC:  JVP: Flat         Bradycardic, irregular rhythm. No murmurs, rubs or gallops.  Trace lower extremity edema. Warm and well perfused extremities. ABDOMEN:  Soft, non-tender, non-distended. NEUROLOGIC: Patient is oriented x3 with no focal or lateralizing neurologic deficits.     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 Concerning for potential amyloid, though dose have multivessel CAD -  Continue Entresto 24-26 mg BID  - Continue spiro 25 mg daily - Continue farxiga 10 mg daily - No benefit to metoprolol with bradycardia and amyloid - Euvolemic today - L/RHC 2/11: RA 5, PA 39/12 (22), PCWP 9, Fick CO/CI 3.5/1.9, PVR 3.7, PAPi 5.4. Severe multivessel CAD. - TEE EF 25-30% Severe BAE. + LAA clot - TTR genetics negative, reviewed with patient and wife - PYP scan pending - Therapy based on results, discuss at next visit - BMP at next visit  2. CAD:  - LHC 2/12 with 3V CAD. Not CABG candidate. No targets for PCI -> medical therapy - Continue ASA/statin  3. Atrial flutter with slow VR:  - Continue Eliquis 5 mg BID - TEE with LAA clot so no DC-CV. Doubt would hold NSR with degree of LAE - Amio stopped in setting of conduction disease   4. Frequent PVCs/NSVT - Continue mexilitine   5. Aortic stenosis/mitral regurgitation: - AS is mild to moderate LF/LG AS  Follow up in 3 months  Clearnce Hasten, MD Advanced Heart Failure Mechanical Circulatory Support 10/29/23

## 2023-10-30 NOTE — Telephone Encounter (Signed)
 Requested Prescriptions  Pending Prescriptions Disp Refills   ENTRESTO 24-26 MG [Pharmacy Med Name: ENTRESTO 24 MG-26 MG TABLET] 60 tablet 0    Sig: TAKE 1 TABLET BY MOUTH TWICE A DAY     Off-Protocol Failed - 10/30/2023  3:31 PM      Failed - Medication not assigned to a protocol, review manually.      Passed - Valid encounter within last 12 months    Recent Outpatient Visits           1 year ago Pain and swelling of right wrist   [facility] [place], Salvadore Oxford, NP   1 year ago Acute cystitis with hematuria   [facility] Althea Charon, [name], DO   2 years ago Annual physical exam   [facility] Smitty Cords, DO   2 years ago Acute cystitis with hematuria   [facility] [facility] [place], [name], DO   2 years ago Acute right ankle pain   [place] [facility] Althea Charon, [name], DO       Future Appointments             In 2 months Althea Charon, [name], DO  [facility], [facility]             meloxicam (MOBIC) 7.5 MG tablet [Pharmacy Med Name: MELOXICAM 7.5 MG TABLET] 90 tablet 0    Sig: TAKE 1 TABLET (7.5 MG TOTAL) BY MOUTH DAILY AS NEEDED FOR PAIN. UP TO 1-2 WEEKS AS NEEDED     Analgesics:  COX2 Inhibitors Failed - 10/30/2023  3:31 PM      Failed - Manual Review: Labs are only required if the patient has taken medication for more than 8 weeks.      Passed - HGB in normal range and within 360 days    Hemoglobin  Date Value Ref Range Status  09/22/2023 14.2 13.0 - 17.0 g/dL Final         Passed - Cr in normal range and within 360 days    Creat  Date Value Ref Range Status  07/06/2021 0.69 (L) 0.70 - 1.28 mg/dL Final   Creatinine, Ser  Date Value Ref Range Status  09/22/2023 0.84 0.61 - 1.24 mg/dL Final         Passed - HCT in normal range and within 360 days    HCT  Date Value Ref Range  Status  09/22/2023 46.2 39.0 - 52.0 % Final         Passed - AST in normal range and within 360 days    AST  Date Value Ref Range Status  09/17/2023 32 15 - 41 U/L Final         Passed - ALT in normal range and within 360 days    ALT  Date Value Ref Range Status  09/17/2023 36 0 - 44 U/L Final         Passed - eGFR is 30 or above and within 360 days    GFR, Est African American  Date Value Ref Range Status  04/27/2020 99 > OR = 60 mL/min/1.41m2 Final   GFR calc Af Amer  Date Value Ref Range Status  05/18/2021 119.5  Final   GFR, Est Non African American  Date Value Ref Range Status  04/27/2020 85 > OR = 60 mL/min/1.90m2 Final   GFR, Estimated  Date Value  Ref Range Status  09/22/2023 >60 >60 mL/min Final    Comment:    (NOTE) Calculated using the CKD-EPI Creatinine Equation (2021)    eGFR  Date Value Ref Range Status  07/06/2021 94 > OR = 60 mL/min/1.21m2 Final    Comment:    The eGFR is based on the CKD-EPI 2021 equation. To calculate  the new eGFR from a previous Creatinine or Cystatin C result, go to https://www.kidney.org/professionals/ kdoqi/gfr%5Fcalculator          Passed - Patient is not pregnant      Passed - Valid encounter within last 12 months    Recent Outpatient Visits           1 year ago Pain and swelling of right wrist   [facility] [place], Salvadore Oxford, NP   1 year ago Acute cystitis with hematuria   [place] [facility] Smitty Cords, DO   2 years ago Annual physical exam   [place] [facility] Smitty Cords, DO   2 years ago Acute cystitis with hematuria   [place] [facility] Smitty Cords, DO   2 years ago Acute right ankle pain   [facility] Smitty Cords, DO       Future Appointments             In 2 months Althea Charon, [name], DO [facility],  [facility]             spironolactone (ALDACTONE) 25 MG tablet [Pharmacy Med Name: SPIRONOLACTONE 25 MG TABLET] 90 tablet     Sig: TAKE 1 TABLET (25 MG TOTAL) BY MOUTH DAILY.     Cardiovascular: Diuretics - Aldosterone Antagonist Failed - 10/30/2023  3:31 PM      Failed - Valid encounter within last 6 months    Recent Outpatient Visits           1 year ago Pain and swelling of right wrist   [place] [facility] [place], Salvadore Oxford, NP   1 year ago Acute cystitis with hematuria   [place] [facility] Smitty Cords, DO   2 years ago Annual physical exam   [place] [facility] Smitty Cords, DO   2 years ago Acute cystitis with hematuria   [place] [facility] Smitty Cords, DO   2 years ago Acute right ankle pain   [facility] [place], [name], DO       Future Appointments             In 2 months Althea Charon, [name], DO [facility], [facility]            Passed - Cr in normal range and within 180 days    Creat  Date Value Ref Range Status  07/06/2021 0.69 (L) 0.70 - 1.28 mg/dL Final   Creatinine, Ser  Date Value Ref Range Status  09/22/2023 0.84 0.61 - 1.24 mg/dL Final         Passed - K in normal range and within 180 days    Potassium  Date Value Ref Range Status  09/22/2023 4.3 3.5 - 5.1 mmol/L Final         Passed - Na in normal range and within 180 days    Sodium  Date  Value Ref Range Status  09/22/2023 135 135 - 145 mmol/L Final  05/18/2021 146 137 - 147 Final         Passed - eGFR is 30 or above and within 180 days    GFR, Est African American  Date Value Ref Range Status  04/27/2020 99 > OR = 60 mL/min/1.26m2 Final   GFR calc Af Amer  Date Value Ref Range Status  05/18/2021 119.5  Final   GFR, Est Non African American  Date Value Ref Range Status  04/27/2020 85 > OR = 60 mL/min/1.69m2  Final   GFR, Estimated  Date Value Ref Range Status  09/22/2023 >60 >60 mL/min Final    Comment:    (NOTE) Calculated using the CKD-EPI Creatinine Equation (2021)    eGFR  Date Value Ref Range Status  07/06/2021 94 > OR = 60 mL/min/1.33m2 Final    Comment:    The eGFR is based on the CKD-EPI 2021 equation. To calculate  the new eGFR from a previous Creatinine or Cystatin C result, go to https://www.kidney.org/professionals/ kdoqi/gfr%5Fcalculator          Passed - Last BP in normal range    BP Readings from Last 1 Encounters:  10/25/23 130/81          atorvastatin (LIPITOR) 80 MG tablet [Pharmacy Med Name: ATORVASTATIN 80 MG TABLET] 90 tablet     Sig: TAKE 1 TABLET BY MOUTH EVERYDAY AT BEDTIME     Cardiovascular:  Antilipid - Statins Failed - 10/30/2023  3:31 PM      Failed - Lipid Panel in normal range within the last 12 months    Cholesterol  Date Value Ref Range Status  09/16/2023 156 0 - 200 mg/dL Final   LDL Cholesterol (Calc)  Date Value Ref Range Status  01/10/2022 103 (H) mg/dL (calc) Final    Comment:    Reference range: <100 . Desirable range <100 mg/dL for primary prevention;   <70 mg/dL for patients with CHD or diabetic patients  with > or = 2 CHD risk factors. Marland Kitchen LDL-C is now calculated using the Martin-Hopkins  calculation, which is a validated novel method providing  better accuracy than the Friedewald equation in the  estimation of LDL-C.  Horald Pollen et al. Lenox Ahr. 1610;960(45): 2061-2068  (http://education.[employer].com/faq/FAQ164)    LDL Cholesterol  Date Value Ref Range Status  09/16/2023 104 (H) 0 - 99 mg/dL Final    Comment:           Total Cholesterol/HDL:CHD Risk Coronary Heart Disease Risk Table                     Men   Women  1/2 Average Risk   3.4   3.3  Average Risk       5.0   4.4  2 X Average Risk   9.6   7.1  3 X Average Risk  23.4   11.0        Use the calculated Patient Ratio above and the CHD Risk Table to  determine the patient's CHD Risk.        ATP III CLASSIFICATION (LDL):  <100     mg/dL   Optimal  409-811  mg/dL   Near or Above                    Optimal  130-159  mg/dL   Borderline  914-782  mg/dL   High  >956     mg/dL  Very High Performed at [facility], [address] [place], [place] [address]    HDL  Date Value Ref Range Status  09/16/2023 41 >40 mg/dL Final   Triglycerides  Date Value Ref Range Status  09/16/2023 56 <150 mg/dL Final         Passed - Patient is not pregnant      Passed - Valid encounter within last 12 months    Recent Outpatient Visits           1 year ago Pain and swelling of right wrist    [facility] [place], Salvadore Oxford, NP   1 year ago Acute cystitis with hematuria   [place] [facility] Smitty Cords, DO   2 years ago Annual physical exam   [place] [facility] Smit[facility] C[facility], DO   2 years ago Acute cystitis with hematuria   [place] [facility] Smitty C[facility], DO   2 years ago Acute right ankle pain   [place] [facility] Althea Charon, [name], DO       Future Appointments             In 2 months Althea Charon, [name], DO [place] [facility], [facility]

## 2023-10-30 NOTE — Telephone Encounter (Signed)
 Requested medications are due for refill today.  yes  Requested medications are on the active medications list.  yes  Last refill. 10/03/2023 #60 0 rf  Future visit scheduled.   yes  Notes to clinic.  Medication not assigned a protocol . Please review for refill.    Requested Prescriptions  Pending Prescriptions Disp Refills   ENTRESTO 24-26 MG [Pharmacy Med Name: ENTRESTO 24 MG-26 MG TABLET] 60 tablet 0    Sig: TAKE 1 TABLET BY MOUTH TWICE A DAY     Off-Protocol Failed - 10/30/2023  3:32 PM      Failed - Medication not assigned to a protocol, review manually.      Passed - Valid encounter within last 12 months    Recent Outpatient Visits           1 year ago Pain and swelling of right wrist   [facility] [place], Salvadore Oxford, NP   1 year ago Acute cystitis with hematuria   [place] [facility] Althea Charon, [name], DO   2 years ago Annual physical exam   [place] [facility] Smitty Cords, DO   2 years ago Acute cystitis with hematuria   [place] [facility] [place], [name], DO   2 years ago Acute right ankle pain   [place] [facility] Althea Charon, [name], DO       Future Appointments             In 2 months Althea Charon, [name], DO  [facility], [facility]            Refused Prescriptions Disp Refills   meloxicam (MOBIC) 7.5 MG tablet [Pharmacy Med Name: MELOXICAM 7.5 MG TABLET] 90 tablet 0    Sig: TAKE 1 TABLET (7.5 MG TOTAL) BY MOUTH DAILY AS NEEDED FOR PAIN. UP TO 1-2 WEEKS AS NEEDED     Analgesics:  COX2 Inhibitors Failed - 10/30/2023  3:32 PM      Failed - Manual Review: Labs are only required if the patient has taken medication for more than 8 weeks.      Passed - HGB in normal range and within 360 days    Hemoglobin  Date Value Ref Range Status  09/22/2023 14.2 13.0 - 17.0 g/dL Final         Passed  - Cr in normal range and within 360 days    Creat  Date Value Ref Range Status  07/06/2021 0.69 (L) 0.70 - 1.28 mg/dL Final   Creatinine, Ser  Date Value Ref Range Status  09/22/2023 0.84 0.61 - 1.24 mg/dL Final         Passed - HCT in normal range and within 360 days    HCT  Date Value Ref Range Status  09/22/2023 46.2 39.0 - 52.0 % Final         Passed - AST in normal range and within 360 days    AST  Date Value Ref Range Status  09/17/2023 32 15 - 41 U/L Final         Passed - ALT in normal range and within 360 days    ALT  Date Value Ref Range Status  09/17/2023 36 0 - 44 U/L Final         Passed - eGFR is 30 or above and within 360 days    GFR, Est African American  Date Value Ref  Range Status  04/27/2020 99 > OR = 60 mL/min/1.62m2 Final   GFR calc Af Amer  Date Value Ref Range Status  05/18/2021 119.5  Final   GFR, Est Non African American  Date Value Ref Range Status  04/27/2020 85 > OR = 60 mL/min/1.77m2 Final   GFR, Estimated  Date Value Ref Range Status  09/22/2023 >60 >60 mL/min Final    Comment:    (NOTE) Calculated using the CKD-EPI Creatinine Equation (2021)    eGFR  Date Value Ref Range Status  07/06/2021 94 > OR = 60 mL/min/1.18m2 Final    Comment:    The eGFR is based on the CKD-EPI 2021 equation. To calculate  the new eGFR from a previous Creatinine or Cystatin C result, go to https://www.kidney.org/professionals/ kdoqi/gfr%5Fcalculator          Passed - Patient is not pregnant      Passed - Valid encounter within last 12 months    Recent Outpatient Visits           1 year ago Pain and swelling of right wrist   [place] [facility] [place], Salvadore Oxford, NP   1 year ago Acute cystitis with hematuria   [facility] Smitty Cords, DO   2 years ago Annual physical exam   [facility] Smit[facility] C[facility], DO   2 years ago Acute cystitis with  hematuria   [facility] Smit[facility] Cords, DO   2 years ago Acute right ankle pain   [place] [facility] Smit[facility] C[facility], DO       Future Appointments             In 2 months Althea Charon, [name], DO [place] [facility]             spironolactone (ALDACTONE) 25 MG tablet [Pharmacy Med Name: SPIRONOLACTONE 25 MG TABLET] 90 tablet     Sig: TAKE 1 TABLET (25 MG TOTAL) BY MOUTH DAILY.     Cardiovascular: Diuretics - Aldosterone Antagonist Failed - 10/30/2023  3:32 PM      Failed - Valid encounter within last 6 months    Recent Outpatient Visits           1 year ago Pain and swelling of right wrist   [facility] [place], Salvadore Oxford, NP   1 year ago Acute cystitis with hematuria   [place] [facility] Smitty Cords, DO   2 years ago Annual physical exam   [facility] Smitty Cords, DO   2 years ago Acute cystitis with hematuria   [place] [facility] Smitty Cords, DO   2 years ago Acute right ankle pain   [place] [facility] Althea Charon, [name], DO       Future Appointments             In 2 months Althea Charon, [name], DO [place] [facility]            Passed - Cr in normal range and within 180 days    Creat  Date Value Ref Range Status  07/06/2021 0.69 (L) 0.70 - 1.28 mg/dL Final   Creatinine, Ser  Date Value Ref Range Status  09/22/2023 0.84 0.61 - 1.24 mg/dL Final  Passed - K in normal range and within 180 days    Potassium  Date Value Ref Range Status  09/22/2023 4.3 3.5 - 5.1 mmol/L Final         Passed - Na in normal range and within 180 days    Sodium  Date Value Ref Range Status  09/22/2023 135 135 - 145 mmol/L Final  05/18/2021 146 137 - 147 Final         Passed - eGFR is  30 or above and within 180 days    GFR, Est African American  Date Value Ref Range Status  04/27/2020 99 > OR = 60 mL/min/1.15m2 Final   GFR calc Af Amer  Date Value Ref Range Status  05/18/2021 119.5  Final   GFR, Est Non African American  Date Value Ref Range Status  04/27/2020 85 > OR = 60 mL/min/1.30m2 Final   GFR, Estimated  Date Value Ref Range Status  09/22/2023 >60 >60 mL/min Final    Comment:    (NOTE) Calculated using the CKD-EPI Creatinine Equation (2021)    eGFR  Date Value Ref Range Status  07/06/2021 94 > OR = 60 mL/min/1.8m2 Final    Comment:    The eGFR is based on the CKD-EPI 2021 equation. To calculate  the new eGFR from a previous Creatinine or Cystatin C result, go to https://www.kidney.org/professionals/ kdoqi/gfr%5Fcalculator          Passed - Last BP in normal range    BP Readings from Last 1 Encounters:  10/25/23 130/81          atorvastatin (LIPITOR) 80 MG tablet [Pharmacy Med Name: ATORVASTATIN 80 MG TABLET] 90 tablet     Sig: TAKE 1 TABLET BY MOUTH EVERYDAY AT BEDTIME     Cardiovascular:  Antilipid - Statins Failed - 10/30/2023  3:32 PM      Failed - Lipid Panel in normal range within the last 12 months    Cholesterol  Date Value Ref Range Status  09/16/2023 156 0 - 200 mg/dL Final   LDL Cholesterol (Calc)  Date Value Ref Range Status  01/10/2022 103 (H) mg/dL (calc) Final    Comment:    Reference range: <100 . Desirable range <100 mg/dL for primary prevention;   <70 mg/dL for patients with CHD or diabetic patients  with > or = 2 CHD risk factors. Marland Kitchen LDL-C is now calculated using the Martin-Hopkins  calculation, which is a validated novel method providing  better accuracy than the Friedewald equation in the  estimation of LDL-C.  Horald Pollen et al. Lenox Ahr. 4098;119(14): 2061-2068  (http://education.[facility].com/faq/FAQ164)    LDL Cholesterol  Date Value Ref Range Status  09/16/2023 104 (H) 0 - 99 mg/dL Final     Comment:           Total Cholesterol/HDL:CHD Risk Coronary Heart Disease Risk Table                     Men   Women  1/2 Average Risk   3.4   3.3  Average Risk       5.0   4.4  2 X Average Risk   9.6   7.1  3 X Average Risk  23.4   11.0        Use the calculated Patient Ratio above and the CHD Risk Table to determine the patient's CHD Risk.        ATP III CLASSIFICATION (LDL):  <100     mg/dL  Optimal  100-129  mg/dL   Near or Above                    Optimal  130-159  mg/dL   Borderline  161-096  mg/dL   High  >045     mg/dL   Very High Performed at [facility], [address] [place], [place] [address]    HDL  Date Value Ref Range Status  09/16/2023 41 >40 mg/dL Final   Triglycerides  Date Value Ref Range Status  09/16/2023 56 <150 mg/dL Final         Passed - Patient is not pregnant      Passed - Valid encounter within last 12 months    Recent Outpatient Visits           1 year ago Pain and swelling of right wrist   [place] [facility] [place], Salvadore Oxford, NP   1 year ago Acute cystitis with hematuria   [place] [facility] Smitty Cords, DO   2 years ago Annual physical exam   [place] [facility] Smitty Cords, DO   2 years ago Acute cystitis with hematuria   [place] [facility] [place], [name], DO   2 years ago Acute right ankle pain   [place] [facility] Althea Charon, [name], DO       Future Appointments             In 2 months Althea Charon, [name], DO [place] [facility], [facility]

## 2023-11-01 ENCOUNTER — Other Ambulatory Visit: Payer: Self-pay | Admitting: Family Medicine

## 2023-11-01 DIAGNOSIS — I824Y1 Acute embolism and thrombosis of unspecified deep veins of right proximal lower extremity: Secondary | ICD-10-CM

## 2023-11-01 DIAGNOSIS — I5020 Unspecified systolic (congestive) heart failure: Secondary | ICD-10-CM

## 2023-11-01 DIAGNOSIS — I4891 Unspecified atrial fibrillation: Secondary | ICD-10-CM

## 2023-11-01 NOTE — Telephone Encounter (Signed)
 Copied from CRM [id number]. Topic: Clinical - Medication Refill >> Nov 01, 2023 12:15 PM Emylou G wrote: Most Recent Primary Care Visit:  Provider: Smitty Cords  Department: [facility]  Visit Type: OFFICE VISIT  Date: 10/03/2023  Medication: mexiletine (MEXITIL) 200 MG capsule ENTRESTO 24-26 MG apixaban (ELIQUIS) 5 MG TABS tablet dapagliflozin propanediol (FARXIGA) 10 MG TABS table   Has the patient contacted their pharmacy? Yes (Agent: If no, request that the patient contact the pharmacy for the refill. If patient does not wish to contact the pharmacy document the reason why and proceed with request.) (Agent: If yes, when and what did the pharmacy advise?) said to call us  Is this the correct pharmacy for this prescription? Yes If no, delete pharmacy and type the correct one.  This is the patient's preferred pharmacy:  [id number] - [place], [place] - [address] [place] [address] Phone: [phone] Fax: [phone]   Has the prescription been filled recently? Yes  Is the patient out of the medication? Yes 1 left   Has the patient been seen for an appointment in the last year OR does the patient have an upcoming appointment? Yes  Can we respond through MyChart? NO  Agent: Please be advised that Rx refills may take up to 3 business days. We ask that you follow-up with your pharmacy.

## 2023-11-02 NOTE — Telephone Encounter (Signed)
 [facility] [phone] was called, spoke with Keith Bates, [facility]. Asked about meds pt requested for refill mexiletine (MEXITIL) 200 MG capsule, ENTRESTO 24-26 MG, apixaban (ELIQUIS) 5 MG TABS tablet, dapagliflozin propanediol (FARXIGA) 10 MG TABS tablet. She states pt picked up Entresto yesterday, the eliquis and Keith Bates is being worked on today and the Mexiletine they don't have in stock but other [facility] near [facility] do. Advised I could call pt and have him call back in and can choose which other [facility] he would like rx transferred to. Advised I would give pt a call. No further assistance noted. Called pt, left detailed message for pt to call pharmacy or call office back if has questions.   Requested Prescriptions  Pending Prescriptions Disp Refills   mexiletine (MEXITIL) 200 MG capsule 180 capsule 1    Sig: Take 1 capsule (200 mg total) by mouth 2 (two) times daily.     Not Delegated - Cardiovascular:  Antiarrhythmic Agents Failed - 11/02/2023  4:46 PM      Failed - This refill cannot be delegated      Failed - Ca in normal range and within 180 days    Calcium  Date Value Ref Range Status  09/22/2023 8.8 (L) 8.9 - 10.3 mg/dL Final   Calcium, Ion  Date Value Ref Range Status  09/19/2023 1.23 1.15 - 1.40 mmol/L Final         Failed - Valid encounter within last 6 months    Recent Outpatient Visits           1 month ago Acute deep vein thrombosis (DVT) of proximal vein of right lower extremity (HCC)   [place] [facility] Smitty Cords, DO       Future Appointments             In 2 months Althea Charon, Keith Neat, DO [place] [facility], [facility]            Passed - Mg Level in normal range and within 180 days    Magnesium  Date Value Ref Range Status  09/15/2023 2.1 1.7 - 2.4 mg/dL Final    Comment:    Performed at [facility], [address] [place], [place] [address]         Passed - K in normal range and within 180 days    Potassium   Date Value Ref Range Status  09/22/2023 4.3 3.5 - 5.1 mmol/L Final         Passed - AST in normal range and within 180 days    AST  Date Value Ref Range Status  09/17/2023 32 15 - 41 U/L Final         Passed - ALT in normal range and within 180 days    ALT  Date Value Ref Range Status  09/17/2023 36 0 - 44 U/L Final         Passed - Patient had ECG in the last 180 days      Passed - Last BP in normal range    BP Readings from Last 1 Encounters:  10/25/23 130/81         Passed - Last Heart Rate in normal range    Pulse Readings from Last 1 Encounters:  10/25/23 82          sacubitril-valsartan (ENTRESTO) 24-26 MG 60 tablet 0    Sig: Take 1 tablet by mouth 2 (two) times daily.     Off-Protocol  Failed - 11/02/2023  4:46 PM      Failed - Medication not assigned to a protocol, review manually.      Failed - Valid encounter within last 12 months    Recent Outpatient Visits           1 month ago Acute deep vein thrombosis (DVT) of proximal vein of right lower extremity [facility])   [place] [facility], Keith Neat, DO       Future Appointments             In 2 months Althea Charon, Keith Neat, DO [place] [facility], [facility]             apixaban (ELIQUIS) 5 MG TABS tablet 60 tablet 2    Sig: Take 1 tablet (5 mg total) by mouth 2 (two) times daily.     Hematology:  Anticoagulants - apixaban Failed - 11/02/2023  4:46 PM      Failed - Valid encounter within last 12 months    Recent Outpatient Visits           1 month ago Acute deep vein thrombosis (DVT) of proximal vein of right lower extremity [facility])   [place] [facility] [place], Keith Neat, DO       Future Appointments             In 2 months Althea Charon, Keith Neat, DO [facility], [facility]            Passed - PLT in normal range and within 360 days    Platelets  Date Value Ref Range Status  09/22/2023  339 150 - 400 K/uL Final         Passed - HGB in normal range and within 360 days    Hemoglobin  Date Value Ref Range Status  09/22/2023 14.2 13.0 - 17.0 g/dL Final         Passed - HCT in normal range and within 360 days    HCT  Date Value Ref Range Status  09/22/2023 46.2 39.0 - 52.0 % Final         Passed - Cr in normal range and within 360 days    Creat  Date Value Ref Range Status  07/06/2021 0.69 (L) 0.70 - 1.28 mg/dL Final   Creatinine, Ser  Date Value Ref Range Status  09/22/2023 0.84 0.61 - 1.24 mg/dL Final         Passed - AST in normal range and within 360 days    AST  Date Value Ref Range Status  09/17/2023 32 15 - 41 U/L Final         Passed - ALT in normal range and within 360 days    ALT  Date Value Ref Range Status  09/17/2023 36 0 - 44 U/L Final          dapagliflozin propanediol (FARXIGA) 10 MG TABS tablet 30 tablet 2    Sig: Take 1 tablet (10 mg total) by mouth daily.     Endocrinology:  Diabetes - SGLT2 Inhibitors Failed - 11/02/2023  4:46 PM      Failed - HBA1C is between 0 and 7.9 and within 180 days    Hgb A1c MFr Bld  Date Value Ref Range Status  01/10/2022 6.0 (H) <5.7 % of total Hgb Final    Comment:    For someone without known diabetes, a hemoglobin  A1c value between  5.7% and 6.4% is consistent with prediabetes and should be confirmed with a  follow-up test. . For someone with known diabetes, a value <7% indicates that their diabetes is well controlled. A1c targets should be individualized based on duration of diabetes, age, comorbid conditions, and other considerations. . This assay result is consistent with an increased risk of diabetes. . Currently, no consensus exists regarding use of hemoglobin A1c for diagnosis of diabetes for children. .          Failed - Valid encounter within last 6 months    Recent Outpatient Visits           1 month ago Acute deep vein thrombosis (DVT) of proximal vein of right lower  extremity [facility])   [place] [facility] [place], Keith Neat, DO       Future Appointments             In 2 months Althea Charon, Keith Neat, DO [place] [facility], [facility]            Passed - Cr in normal range and within 360 days    Creat  Date Value Ref Range Status  07/06/2021 0.69 (L) 0.70 - 1.28 mg/dL Final   Creatinine, Ser  Date Value Ref Range Status  09/22/2023 0.84 0.61 - 1.24 mg/dL Final         Passed - eGFR in normal range and within 360 days    GFR, Est African American  Date Value Ref Range Status  04/27/2020 99 > OR = 60 mL/min/1.47m2 Final   GFR calc Af Amer  Date Value Ref Range Status  05/18/2021 119.5  Final   GFR, Est Non African American  Date Value Ref Range Status  04/27/2020 85 > OR = 60 mL/min/1.53m2 Final   GFR, Estimated  Date Value Ref Range Status  09/22/2023 >60 >60 mL/min Final    Comment:    (NOTE) Calculated using the CKD-EPI Creatinine Equation (2021)    eGFR  Date Value Ref Range Status  07/06/2021 94 > OR = 60 mL/min/1.46m2 Final    Comment:    The eGFR is based on the CKD-EPI 2021 equation. To calculate  the new eGFR from a previous Creatinine or Cystatin C result, go to https://www.kidney.org/professionals/ kdoqi/gfr%5Fcalculator

## 2023-11-03 NOTE — Telephone Encounter (Signed)
 Filled by a different physician

## 2023-11-28 ENCOUNTER — Other Ambulatory Visit: Payer: Self-pay | Admitting: Family Medicine

## 2023-11-28 DIAGNOSIS — M06031 Rheumatoid arthritis without rheumatoid factor, right wrist: Secondary | ICD-10-CM

## 2023-11-28 DIAGNOSIS — M150 Primary generalized (osteo)arthritis: Secondary | ICD-10-CM

## 2023-11-28 DIAGNOSIS — I5020 Unspecified systolic (congestive) heart failure: Secondary | ICD-10-CM

## 2023-11-29 NOTE — Telephone Encounter (Signed)
 Requested Prescriptions  Pending Prescriptions Disp Refills   atorvastatin  (LIPITOR ) 80 MG tablet [Pharmacy Med Name: ATORVASTATIN  80 MG TABLET] 90 tablet 0    Sig: TAKE 1 TABLET BY MOUTH EVERYDAY AT BEDTIME     Cardiovascular:  Antilipid - Statins Failed - 11/29/2023 12:30 PM      Failed - Valid encounter within last 12 months    Recent Outpatient Visits           1 month ago Acute deep vein thrombosis (DVT) of proximal vein of right lower extremity [facility])   [place] [facility] [place], Kayleen Party, DO       Future Appointments             In 1 month [place], Kayleen Party, DO [place] [facility], [facility]            Failed - Lipid Panel in normal range within the last 12 months    Cholesterol  Date Value Ref Range Status  09/16/2023 156 0 - 200 mg/dL Final   LDL Cholesterol (Calc)  Date Value Ref Range Status  01/10/2022 103 (H) mg/dL (calc) Final    Comment:    Reference range: <100 . Desirable range <100 mg/dL for primary prevention;   <70 mg/dL for patients with CHD or diabetic patients  with > or = 2 CHD risk factors. Aaron Aas LDL-C is now calculated using the Martin-Hopkins  calculation, which is a validated novel method providing  better accuracy than the Friedewald equation in the  estimation of LDL-C.  Melinda Sprawls et al. Erroll Heard. 4098;119(14): 2061-2068  (http://education.[employer].com/faq/FAQ164)    LDL Cholesterol  Date Value Ref Range Status  09/16/2023 104 (H) 0 - 99 mg/dL Final    Comment:           Total Cholesterol/HDL:CHD Risk Coronary Heart Disease Risk Table                     Men   Women  1/2 Average Risk   3.4   3.3  Average Risk       5.0   4.4  2 X Average Risk   9.6   7.1  3 X Average Risk  23.4   11.0        Use the calculated Patient Ratio above and the CHD Risk Table to determine the patient's CHD Risk.        ATP III CLASSIFICATION (LDL):  <100     mg/dL   Optimal  782-956  mg/dL   Near  or Above                    Optimal  130-159  mg/dL   Borderline  213-086  mg/dL   High  >578     mg/dL   Very High Performed at [facility], [address] [place], [place] [address]    HDL  Date Value Ref Range Status  09/16/2023 41 >40 mg/dL Final   Triglycerides  Date Value Ref Range Status  09/16/2023 56 <150 mg/dL Final         Passed - Patient is not pregnant       meloxicam  (MOBIC ) 7.5 MG tablet [Pharmacy Med Name: MELOXICAM  7.5 MG TABLET] 90 tablet 0    Sig: TAKE 1 TABLET (7.5 MG TOTAL) BY MOUTH DAILY AS NEEDED FOR PAIN. UP TO 1-2 WEEKS AS NEEDED     Analgesics:  COX2 Inhibitors Failed - 11/29/2023  12:30 PM      Failed - Manual Review: Labs are only required if the patient has taken medication for more than 8 weeks.      Failed - Valid encounter within last 12 months    Recent Outpatient Visits           1 month ago Acute deep vein thrombosis (DVT) of proximal vein of right lower extremity (HCC)   [place] [facility] [place], Kayleen Party, DO       Future Appointments             In 1 month [place], Kayleen Party, DO [facility], [facility]            Passed - HGB in normal range and within 360 days    Hemoglobin  Date Value Ref Range Status  09/22/2023 14.2 13.0 - 17.0 g/dL Final         Passed - Cr in normal range and within 360 days    Creat  Date Value Ref Range Status  07/06/2021 0.69 (L) 0.70 - 1.28 mg/dL Final   Creatinine, Ser  Date Value Ref Range Status  09/22/2023 0.84 0.61 - 1.24 mg/dL Final         Passed - HCT in normal range and within 360 days    HCT  Date Value Ref Range Status  09/22/2023 46.2 39.0 - 52.0 % Final         Passed - AST in normal range and within 360 days    AST  Date Value Ref Range Status  09/17/2023 32 15 - 41 U/L Final         Passed - ALT in normal range and within 360 days    ALT  Date Value Ref Range Status  09/17/2023 36 0 - 44 U/L  Final         Passed - eGFR is 30 or above and within 360 days    GFR, Est African American  Date Value Ref Range Status  04/27/2020 99 > OR = 60 mL/min/1.67m2 Final   GFR calc Af Amer  Date Value Ref Range Status  05/18/2021 119.5  Final   GFR, Est Non African American  Date Value Ref Range Status  04/27/2020 85 > OR = 60 mL/min/1.53m2 Final   GFR, Estimated  Date Value Ref Range Status  09/22/2023 >60 >60 mL/min Final    Comment:    (NOTE) Calculated using the CKD-EPI Creatinine Equation (2021)    eGFR  Date Value Ref Range Status  07/06/2021 94 > OR = 60 mL/min/1.52m2 Final    Comment:    The eGFR is based on the CKD-EPI 2021 equation. To calculate  the new eGFR from a previous Creatinine or Cystatin C result, go to https://www.kidney.org/professionals/ kdoqi/gfr%5Fcalculator          Passed - Patient is not pregnant       spironolactone  (ALDACTONE ) 25 MG tablet [Pharmacy Med Name: SPIRONOLACTONE  25 MG TABLET] 90 tablet 0    Sig: TAKE 1 TABLET (25 MG TOTAL) BY MOUTH DAILY.     Cardiovascular: Diuretics - Aldosterone Antagonist Failed - 11/29/2023 12:30 PM      Failed - Valid encounter within last 6 months    Recent Outpatient Visits           1 month ago Acute deep vein thrombosis (DVT) of proximal vein of right lower extremity [facility])   [place] [facility]  Raina Bunting, DO       Future Appointments             In 1 month [place], Kayleen Party, DO [name] [facility]            Passed - Cr in normal range and within 180 days    Creat  Date Value Ref Range Status  07/06/2021 0.69 (L) 0.70 - 1.28 mg/dL Final   Creatinine, Ser  Date Value Ref Range Status  09/22/2023 0.84 0.61 - 1.24 mg/dL Final         Passed - K in normal range and within 180 days    Potassium  Date Value Ref Range Status  09/22/2023 4.3 3.5 - 5.1 mmol/L Final         Passed - Na in normal range and within 180 days     Sodium  Date Value Ref Range Status  09/22/2023 135 135 - 145 mmol/L Final  05/18/2021 146 137 - 147 Final         Passed - eGFR is 30 or above and within 180 days    GFR, Est African American  Date Value Ref Range Status  04/27/2020 99 > OR = 60 mL/min/1.43m2 Final   GFR calc Af Amer  Date Value Ref Range Status  05/18/2021 119.5  Final   GFR, Est Non African American  Date Value Ref Range Status  04/27/2020 85 > OR = 60 mL/min/1.75m2 Final   GFR, Estimated  Date Value Ref Range Status  09/22/2023 >60 >60 mL/min Final    Comment:    (NOTE) Calculated using the CKD-EPI Creatinine Equation (2021)    eGFR  Date Value Ref Range Status  07/06/2021 94 > OR = 60 mL/min/1.72m2 Final    Comment:    The eGFR is based on the CKD-EPI 2021 equation. To calculate  the new eGFR from a previous Creatinine or Cystatin C result, go to https://www.kidney.org/professionals/ kdoqi/gfr%5Fcalculator          Passed - Last BP in normal range    BP Readings from Last 1 Encounters:  10/25/23 130/81

## 2023-11-30 ENCOUNTER — Telehealth: Payer: Self-pay

## 2023-11-30 NOTE — Telephone Encounter (Signed)
 Home health called in stating she is with patient now and his BP is 100/62 and his HR has been in the 30's-40"s. Patient is asymptomatic. Any need for concern? Should patient go to ED? Thanks!

## 2023-11-30 NOTE — Telephone Encounter (Signed)
 Please return call to Home Health.  He does not need to go to the ED at this time. I have reviewed his chart, med list, and I have just talked to his Cardiologist through an [employer] and we are in agreement on this plan.  The blood pressure is low but within a range he has been previously. That is not a concern.  The HR 30-40s is concerning, but it is acceptable as long as he is asymptomatic and not experiencing symptoms at rest or while walking / active - symptoms to look out for would be shortness of breath, or significant dizzy lightheaded near passing out, chest pain pressure.  If he remains low HR 30-50s and has no symptoms at rest or with activity. No changes required at this time.   If he develops significant symptoms with activity or rest, then he should be evaluated in hospital ED and or contact Cardiology office.  Domingo Friend, DO [facility] 11/30/2023, 11:40 AM

## 2023-11-30 NOTE — Telephone Encounter (Signed)
 Attempted to reach patient again to check in on him and advise below. LM to call back.

## 2023-11-30 NOTE — Telephone Encounter (Signed)
 I do not have a number to contact Home Health Nurse back on. I have attempted to reach patient several times, no answer. I did leave VM for patient to call back to advise further.

## 2023-12-04 ENCOUNTER — Telehealth: Payer: Self-pay | Admitting: Cardiology

## 2023-12-04 DIAGNOSIS — I5020 Unspecified systolic (congestive) heart failure: Secondary | ICD-10-CM

## 2023-12-04 MED ORDER — ENTRESTO 24-26 MG PO TABS: 1.0000 | ORAL_TABLET | Freq: Two times a day (BID) | ORAL | 5 refills | Status: DC

## 2023-12-04 NOTE — Progress Notes (Signed)
 Refills sent to pharmacy.

## 2023-12-05 ENCOUNTER — Encounter: Payer: Self-pay | Admitting: Family Medicine

## 2023-12-05 ENCOUNTER — Ambulatory Visit: Payer: Self-pay

## 2023-12-05 ENCOUNTER — Ambulatory Visit (INDEPENDENT_AMBULATORY_CARE_PROVIDER_SITE_OTHER): Admitting: Family Medicine

## 2023-12-05 VITALS — BP 118/76 | HR 62 | Ht 71.0 in | Wt 148.0 lb

## 2023-12-05 DIAGNOSIS — L089 Local infection of the skin and subcutaneous tissue, unspecified: Secondary | ICD-10-CM

## 2023-12-05 DIAGNOSIS — L720 Epidermal cyst: Secondary | ICD-10-CM | POA: Diagnosis not present

## 2023-12-05 MED ORDER — MUPIROCIN 2 % EX OINT
1.0000 | TOPICAL_OINTMENT | Freq: Two times a day (BID) | CUTANEOUS | 0 refills | Status: DC
Start: 2023-12-05 — End: 2024-01-02

## 2023-12-05 MED ORDER — DOXYCYCLINE HYCLATE 100 MG PO TABS: 100.0000 mg | ORAL_TABLET | Freq: Two times a day (BID) | ORAL | 0 refills | Status: DC

## 2023-12-05 NOTE — Progress Notes (Signed)
 Subjective:    Patient ID: Keith Bates, male    DOB: 1941/02/19, 83 y.o.   MRN: [id number]  Keith Bates is a 83 y.o. male presenting on 12/05/2023 for Cyst (Infected epidermal cyst on back)  Patient presents for a same day appointment.   HPI  Discussed the use of AI scribe software for clinical note transcription with the patient, who gave verbal consent to proceed.  History of Present Illness   Keith Bates is an 83 year old male who presents with an infected cyst on his back.  Recent problem onset over past 2-3 weeks. He has a cyst on his back that initially appeared like a blackhead and has since become infected, emitting a foul smell and a greenish-yellow discharge.  He has been managing the cyst by squeezing it and applying topical treatments, but it remains large and firm with some liquid and firm material being drained. No significant pain is reported from the cyst, as he is accustomed to having it squeezed and drained.          12/05/2023    2:55 PM 12/30/2022    9:29 AM 01/10/2022    9:17 AM  Depression screen PHQ 2/9  Decreased Interest 0 0 0  Down, Depressed, Hopeless 0 0 0  PHQ - 2 Score 0 0 0  Altered sleeping 0 0 1  Tired, decreased energy 0 0 0  Change in appetite 0 0 0  Feeling bad or failure about yourself  0 0 0  Trouble concentrating 0 0 0  Moving slowly or fidgety/restless 0 0 0  Suicidal thoughts 0 0 0  PHQ-9 Score 0 0 1  Difficult doing work/chores  Not difficult at all Not difficult at all       12/05/2023    2:56 PM 01/10/2022    9:17 AM  GAD 7 : Generalized Anxiety Score  Nervous, Anxious, on Edge 1 0  Control/stop worrying 0 0  Worry too much - different things 0 0  Trouble relaxing 0 0  Restless 0 0  Easily annoyed or irritable 2 1  Afraid - awful might happen 0 0  Total GAD 7 Score 3 1  Anxiety Difficulty Somewhat difficult Not difficult at all    Social History   Tobacco Use   Smoking status: Former    Current packs/day: 0.00     Average packs/day: 1.5 packs/day for 10.0 years (15.0 ttl pk-yrs)    Types: Cigarettes    Start date: 06/06/1962    Quit date: 06/06/1972    Years since quitting: 51.5   Smokeless tobacco: Former  Building services engineer status: Former  Substance Use Topics   Alcohol use: No   Drug use: No    Review of Systems Per HPI unless specifically indicated above     Objective:    BP 118/76 (BP Location: Right Arm, Patient Position: Sitting, Cuff Size: Normal)   Pulse 62   Ht 5\' 11"  (1.803 m)   Wt 148 lb (67.1 kg)   BMI 20.64 kg/m   Wt Readings from Last 3 Encounters:  12/05/23 148 lb (67.1 kg)  10/25/23 144 lb (65.3 kg)  10/03/23 142 lb (64.4 kg)    Physical Exam Vitals and nursing note reviewed.  Constitutional:      General: He is not in acute distress.    Appearance: Normal appearance. He is well-developed. He is not diaphoretic.     Comments: Well-appearing, comfortable, cooperative  HENT:  Head: Normocephalic and atraumatic.  Eyes:     General:        Right eye: No discharge.        Left eye: No discharge.     Conjunctiva/sclera: Conjunctivae normal.  Cardiovascular:     Rate and Rhythm: Normal rate.  Pulmonary:     Effort: Pulmonary effort is normal.  Skin:    General: Skin is warm and dry.     Findings: Lesion (left low back epidermal cyst with induration and localized ecchymosis vs erythema, 8 x 4 cm approx size. see photos. central pore, not active drain.) present. No erythema or rash.  Neurological:     Mental Status: He is alert and oriented to person, place, and time.  Psychiatric:        Mood and Affect: Mood normal.        Behavior: Behavior normal.        Thought Content: Thought content normal.     Comments: Well groomed, good eye contact, normal speech and thoughts     Left Low Back Cyst   Left Low Back Cyst    Results for orders placed or performed during the hospital encounter of 09/15/23  Basic metabolic panel   Collection Time: 09/15/23  10:44 AM  Result Value Ref Range   Sodium 140 135 - 145 mmol/L   Potassium 4.2 3.5 - 5.1 mmol/L   Chloride 107 98 - 111 mmol/L   CO2 19 (L) 22 - 32 mmol/L   Glucose, Bld 104 (H) 70 - 99 mg/dL   BUN 12 8 - 23 mg/dL   Creatinine, Ser 4.09 0.61 - 1.24 mg/dL   Calcium  9.1 8.9 - 10.3 mg/dL   GFR, Estimated >81 >19 mL/min   Anion gap 14 5 - 15  CBC   Collection Time: 09/15/23 10:44 AM  Result Value Ref Range   WBC 7.9 4.0 - 10.5 K/uL   RBC 4.32 4.22 - 5.81 MIL/uL   Hemoglobin 12.5 (L) 13.0 - 17.0 g/dL   HCT 14.7 82.9 - 56.2 %   MCV 92.8 80.0 - 100.0 fL   MCH 28.9 26.0 - 34.0 pg   MCHC 31.2 30.0 - 36.0 g/dL   RDW 13.0 (H) 86.5 - 78.4 %   Platelets 207 150 - 400 K/uL   nRBC 0.0 0.0 - 0.2 %  Brain natriuretic peptide   Collection Time: 09/15/23 10:44 AM  Result Value Ref Range   B Natriuretic Peptide 707.9 (H) 0.0 - 100.0 pg/mL  TSH   Collection Time: 09/15/23 10:44 AM  Result Value Ref Range   TSH 1.804 0.350 - 4.500 uIU/mL  Troponin I (High Sensitivity)   Collection Time: 09/15/23 10:44 AM  Result Value Ref Range   Troponin I (High Sensitivity) 44 (H) <18 ng/L  Magnesium    Collection Time: 09/15/23  3:56 PM  Result Value Ref Range   Magnesium  2.1 1.7 - 2.4 mg/dL  Phosphorus   Collection Time: 09/15/23  3:56 PM  Result Value Ref Range   Phosphorus 3.6 2.5 - 4.6 mg/dL  Troponin I (High Sensitivity)   Collection Time: 09/15/23  3:56 PM  Result Value Ref Range   Troponin I (High Sensitivity) 50 (H) <18 ng/L  CBG monitoring, ED   Collection Time: 09/15/23  7:21 PM  Result Value Ref Range   Glucose-Capillary 123 (H) 70 - 99 mg/dL  Lipid panel   Collection Time: 09/16/23  9:16 AM  Result Value Ref Range   Cholesterol 156 0 -  200 mg/dL   Triglycerides 56 <161 mg/dL   HDL 41 >09 mg/dL   Total CHOL/HDL Ratio 3.8 RATIO   VLDL 11 0 - 40 mg/dL   LDL Cholesterol 604 (H) 0 - 99 mg/dL  Hepatic function panel   Collection Time: 09/16/23  9:16 AM  Result Value Ref Range    Total Protein 6.5 6.5 - 8.1 g/dL   Albumin 3.5 3.5 - 5.0 g/dL   AST 32 15 - 41 U/L   ALT 32 0 - 44 U/L   Alkaline Phosphatase 74 38 - 126 U/L   Total Bilirubin 2.5 (H) 0.0 - 1.2 mg/dL   Bilirubin, Direct 0.3 (H) 0.0 - 0.2 mg/dL   Indirect Bilirubin 2.2 (H) 0.3 - 0.9 mg/dL  Basic metabolic panel   Collection Time: 09/16/23  9:16 AM  Result Value Ref Range   Sodium 139 135 - 145 mmol/L   Potassium 3.7 3.5 - 5.1 mmol/L   Chloride 105 98 - 111 mmol/L   CO2 25 22 - 32 mmol/L   Glucose, Bld 110 (H) 70 - 99 mg/dL   BUN 15 8 - 23 mg/dL   Creatinine, Ser 5.40 0.61 - 1.24 mg/dL   Calcium  8.9 8.9 - 10.3 mg/dL   GFR, Estimated >98 >11 mL/min   Anion gap 9 5 - 15  ECHOCARDIOGRAM COMPLETE   Collection Time: 09/16/23 10:00 AM  Result Value Ref Range   Weight 2,271.62 oz   Height 71 in   BP 143/95 mmHg   Ao pk vel 1.75 m/s   AV Area VTI 1.46 cm2   AR max vel 1.35 cm2   AV Mean grad 7.0 mmHg   AV Peak grad 12.3 mmHg   Single Plane A2C EF 38.1 %   Single Plane A4C EF 37.7 %   Calc EF 35.8 %   S' Lateral 4.00 cm   AV Area mean vel 1.35 cm2   Area-P 1/2 4.04 cm2   MV VTI 2.31 cm2   Est EF 35 - 40%   Urinalysis, Complete w Microscopic -Urine, Random   Collection Time: 09/16/23 11:22 AM  Result Value Ref Range   Color, Urine COLORLESS (A) YELLOW   APPearance CLEAR (A) CLEAR   Specific Gravity, Urine 1.003 (L) 1.005 - 1.030   pH 7.0 5.0 - 8.0   Glucose, UA NEGATIVE NEGATIVE mg/dL   Hgb urine dipstick NEGATIVE NEGATIVE   Bilirubin Urine NEGATIVE NEGATIVE   Ketones, ur NEGATIVE NEGATIVE mg/dL   Protein, ur NEGATIVE NEGATIVE mg/dL   Nitrite NEGATIVE NEGATIVE   Leukocytes,Ua NEGATIVE NEGATIVE   RBC / HPF 0-5 0 - 5 RBC/hpf   WBC, UA 0-5 0 - 5 WBC/hpf   Bacteria, UA NONE SEEN NONE SEEN   Squamous Epithelial / HPF 0 0 - 5 /HPF  APTT   Collection Time: 09/16/23 11:22 AM  Result Value Ref Range   aPTT 25 24 - 36 seconds  Protime-INR   Collection Time: 09/16/23 11:22 AM  Result Value  Ref Range   Prothrombin Time 16.7 (H) 11.4 - 15.2 seconds   INR 1.3 (H) 0.8 - 1.2  Heparin  level (unfractionated)   Collection Time: 09/16/23  7:20 PM  Result Value Ref Range   Heparin  Unfractionated 0.16 (L) 0.30 - 0.70 IU/mL  Basic metabolic panel   Collection Time: 09/17/23  3:13 AM  Result Value Ref Range   Sodium 134 (L) 135 - 145 mmol/L   Potassium 3.0 (L) 3.5 - 5.1 mmol/L   Chloride  100 98 - 111 mmol/L   CO2 25 22 - 32 mmol/L   Glucose, Bld 138 (H) 70 - 99 mg/dL   BUN 16 8 - 23 mg/dL   Creatinine, Ser 1.61 0.61 - 1.24 mg/dL   Calcium  8.5 (L) 8.9 - 10.3 mg/dL   GFR, Estimated >09 >60 mL/min   Anion gap 9 5 - 15  Heparin  level (unfractionated)   Collection Time: 09/17/23  3:13 AM  Result Value Ref Range   Heparin  Unfractionated 0.49 0.30 - 0.70 IU/mL  CBC   Collection Time: 09/17/23  3:13 AM  Result Value Ref Range   WBC 10.2 4.0 - 10.5 K/uL   RBC 4.18 (L) 4.22 - 5.81 MIL/uL   Hemoglobin 12.1 (L) 13.0 - 17.0 g/dL   HCT 45.4 (L) 09.8 - 11.9 %   MCV 88.0 80.0 - 100.0 fL   MCH 28.9 26.0 - 34.0 pg   MCHC 32.9 30.0 - 36.0 g/dL   RDW 14.7 (H) 82.9 - 56.2 %   Platelets 215 150 - 400 K/uL   nRBC 0.0 0.0 - 0.2 %  Heparin  level (unfractionated)   Collection Time: 09/17/23 11:01 AM  Result Value Ref Range   Heparin  Unfractionated 0.43 0.30 - 0.70 IU/mL  Hepatic function panel   Collection Time: 09/17/23 11:01 AM  Result Value Ref Range   Total Protein 6.5 6.5 - 8.1 g/dL   Albumin 3.3 (L) 3.5 - 5.0 g/dL   AST 32 15 - 41 U/L   ALT 36 0 - 44 U/L   Alkaline Phosphatase 69 38 - 126 U/L   Total Bilirubin 2.8 (H) 0.0 - 1.2 mg/dL   Bilirubin, Direct 0.3 (H) 0.0 - 0.2 mg/dL   Indirect Bilirubin 2.5 (H) 0.3 - 0.9 mg/dL  Heparin  level (unfractionated)   Collection Time: 09/18/23  5:43 AM  Result Value Ref Range   Heparin  Unfractionated 0.45 0.30 - 0.70 IU/mL  Basic metabolic panel   Collection Time: 09/18/23  5:43 AM  Result Value Ref Range   Sodium 136 135 - 145 mmol/L    Potassium 4.3 3.5 - 5.1 mmol/L   Chloride 102 98 - 111 mmol/L   CO2 26 22 - 32 mmol/L   Glucose, Bld 112 (H) 70 - 99 mg/dL   BUN 21 8 - 23 mg/dL   Creatinine, Ser 1.30 0.61 - 1.24 mg/dL   Calcium  8.8 (L) 8.9 - 10.3 mg/dL   GFR, Estimated >86 >57 mL/min   Anion gap 8 5 - 15  CBC   Collection Time: 09/18/23  5:43 AM  Result Value Ref Range   WBC 10.1 4.0 - 10.5 K/uL   RBC 4.18 (L) 4.22 - 5.81 MIL/uL   Hemoglobin 12.2 (L) 13.0 - 17.0 g/dL   HCT 84.6 (L) 96.2 - 95.2 %   MCV 89.0 80.0 - 100.0 fL   MCH 29.2 26.0 - 34.0 pg   MCHC 32.8 30.0 - 36.0 g/dL   RDW 84.1 (H) 32.4 - 40.1 %   Platelets 223 150 - 400 K/uL   nRBC 0.0 0.0 - 0.2 %  Multiple Myeloma Panel (SPEP&IFE w/QIG)   Collection Time: 09/18/23  9:26 AM  Result Value Ref Range   IgG (Immunoglobin G), Serum 1,040 603 - 1,613 mg/dL   IgA 027 61 - 253 mg/dL   IgM (Immunoglobulin M), Srm 45 15 - 143 mg/dL   Total Protein ELP 6.0 6.0 - 8.5 g/dL   Albumin SerPl Elph-Mcnc 3.0 2.9 - 4.4 g/dL  Alpha 1 0.4 0.0 - 0.4 g/dL   Alpha2 Glob SerPl Elph-Mcnc 0.7 0.4 - 1.0 g/dL   B-Globulin SerPl Elph-Mcnc 1.0 0.7 - 1.3 g/dL   Gamma Glob SerPl Elph-Mcnc 0.9 0.4 - 1.8 g/dL   M Protein SerPl Elph-Mcnc 0.3 (H) Not Observed g/dL   Globulin, Total 3.0 2.2 - 3.9 g/dL   Albumin/Glob SerPl 1.1 0.7 - 1.7   IFE 1 Comment (A)    Please Note Comment   Kappa/lambda light chains   Collection Time: 09/18/23  9:26 AM  Result Value Ref Range   Kappa free light chain 23.6 (H) 3.3 - 19.4 mg/L   Lambda free light chains 21.2 5.7 - 26.3 mg/L   Kappa, lambda light chain ratio 1.11 0.26 - 1.65  Prealbumin   Collection Time: 09/18/23  9:26 AM  Result Value Ref Range   Prealbumin 14 (L) 18 - 38 mg/dL  Beta-2 -glycoprotein i abs, IgG/M/A   Collection Time: 09/18/23  9:26 AM  Result Value Ref Range   Beta-2  Glyco I IgG <9 0 - 20 GPI IgG units   Beta-2 -Glycoprotein I IgM <9 0 - 32 GPI IgM units   Beta-2 -Glycoprotein I IgA <9 0 - 25 GPI IgA units  Factor 5  leiden   Collection Time: 09/18/23  9:26 AM  Result Value Ref Range   Recommendations-F5LEID: Comment    Reviewed By: Comment   Prothrombin gene mutation   Collection Time: 09/18/23  9:26 AM  Result Value Ref Range   Recommendations-PTGENE: Comment    Reviewed by: Comment   Cardiolipin antibodies, IgG, IgM, IgA   Collection Time: 09/18/23  9:26 AM  Result Value Ref Range   Anticardiolipin IgG <9 0 - 14 GPL U/mL   Anticardiolipin IgM <9 0 - 12 MPL U/mL   Anticardiolipin IgA <9 0 - 11 APL U/mL  Immunofixation, urine   Collection Time: 09/18/23  9:59 AM  Result Value Ref Range   Immunofixation, Urine Comment   Bilirubin, fractionated(tot/dir/indir)   Collection Time: 09/18/23  3:36 PM  Result Value Ref Range   Total Bilirubin 1.3 (H) 0.0 - 1.2 mg/dL   Bilirubin, Direct 0.2 0.0 - 0.2 mg/dL   Indirect Bilirubin 1.1 (H) 0.3 - 0.9 mg/dL  Basic metabolic panel   Collection Time: 09/19/23  1:57 AM  Result Value Ref Range   Sodium 132 (L) 135 - 145 mmol/L   Potassium 4.0 3.5 - 5.1 mmol/L   Chloride 100 98 - 111 mmol/L   CO2 21 (L) 22 - 32 mmol/L   Glucose, Bld 119 (H) 70 - 99 mg/dL   BUN 22 8 - 23 mg/dL   Creatinine, Ser 0.86 0.61 - 1.24 mg/dL   Calcium  8.6 (L) 8.9 - 10.3 mg/dL   GFR, Estimated >57 >84 mL/min   Anion gap 11 5 - 15  CBC   Collection Time: 09/19/23  1:57 AM  Result Value Ref Range   WBC 10.3 4.0 - 10.5 K/uL   RBC 4.27 4.22 - 5.81 MIL/uL   Hemoglobin 12.3 (L) 13.0 - 17.0 g/dL   HCT 69.6 (L) 29.5 - 28.4 %   MCV 90.9 80.0 - 100.0 fL   MCH 28.8 26.0 - 34.0 pg   MCHC 31.7 30.0 - 36.0 g/dL   RDW 13.2 (H) 44.0 - 10.2 %   Platelets 242 150 - 400 K/uL   nRBC 0.0 0.0 - 0.2 %  Heparin  level (unfractionated)   Collection Time: 09/19/23  1:57 AM  Result Value Ref Range  Heparin  Unfractionated 0.33 0.30 - 0.70 IU/mL  I-STAT 7, (LYTES, BLD GAS, ICA, H+H)   Collection Time: 09/19/23  5:23 PM  Result Value Ref Range   pH, Arterial 7.450 7.35 - 7.45   pCO2 arterial  28.0 (L) 32 - 48 mmHg   pO2, Arterial 77 (L) 83 - 108 mmHg   Bicarbonate 19.5 (L) 20.0 - 28.0 mmol/L   TCO2 20 (L) 22 - 32 mmol/L   O2 Saturation 96 %   Acid-base deficit 3.0 (H) 0.0 - 2.0 mmol/L   Sodium 137 135 - 145 mmol/L   Potassium 4.1 3.5 - 5.1 mmol/L   Calcium , Ion 1.22 1.15 - 1.40 mmol/L   HCT 39.0 39.0 - 52.0 %   Hemoglobin 13.3 13.0 - 17.0 g/dL   Sample type ARTERIAL   POCT I-Stat EG7   Collection Time: 09/19/23  5:28 PM  Result Value Ref Range   pH, Ven 7.377 7.25 - 7.43   pCO2, Ven 40.4 (L) 44 - 60 mmHg   pO2, Ven 31 (LL) 32 - 45 mmHg   Bicarbonate 23.8 20.0 - 28.0 mmol/L   TCO2 25 22 - 32 mmol/L   O2 Saturation 57 %   Acid-base deficit 1.0 0.0 - 2.0 mmol/L   Sodium 137 135 - 145 mmol/L   Potassium 4.1 3.5 - 5.1 mmol/L   Calcium , Ion 1.24 1.15 - 1.40 mmol/L   HCT 40.0 39.0 - 52.0 %   Hemoglobin 13.6 13.0 - 17.0 g/dL   Sample type VENOUS    Comment NOTIFIED PHYSICIAN   POCT I-Stat EG7   Collection Time: 09/19/23  5:29 PM  Result Value Ref Range   pH, Ven 7.383 7.25 - 7.43   pCO2, Ven 39.8 (L) 44 - 60 mmHg   pO2, Ven 30 (LL) 32 - 45 mmHg   Bicarbonate 23.7 20.0 - 28.0 mmol/L   TCO2 25 22 - 32 mmol/L   O2 Saturation 57 %   Acid-base deficit 1.0 0.0 - 2.0 mmol/L   Sodium 136 135 - 145 mmol/L   Potassium 4.0 3.5 - 5.1 mmol/L   Calcium , Ion 1.23 1.15 - 1.40 mmol/L   HCT 39.0 39.0 - 52.0 %   Hemoglobin 13.3 13.0 - 17.0 g/dL   Sample type VENOUS    Comment NOTIFIED PHYSICIAN   Basic metabolic panel   Collection Time: 09/20/23  4:15 AM  Result Value Ref Range   Sodium 135 135 - 145 mmol/L   Potassium 4.0 3.5 - 5.1 mmol/L   Chloride 103 98 - 111 mmol/L   CO2 22 22 - 32 mmol/L   Glucose, Bld 135 (H) 70 - 99 mg/dL   BUN 23 8 - 23 mg/dL   Creatinine, Ser 1.61 0.61 - 1.24 mg/dL   Calcium  8.5 (L) 8.9 - 10.3 mg/dL   GFR, Estimated >09 >60 mL/min   Anion gap 10 5 - 15  CBC   Collection Time: 09/20/23  4:15 AM  Result Value Ref Range   WBC 8.5 4.0 - 10.5 K/uL    RBC 4.36 4.22 - 5.81 MIL/uL   Hemoglobin 12.5 (L) 13.0 - 17.0 g/dL   HCT 45.4 09.8 - 11.9 %   MCV 89.7 80.0 - 100.0 fL   MCH 28.7 26.0 - 34.0 pg   MCHC 32.0 30.0 - 36.0 g/dL   RDW 14.7 (H) 82.9 - 56.2 %   Platelets 278 150 - 400 K/uL   nRBC 0.0 0.0 - 0.2 %  Heparin  level (unfractionated)  Collection Time: 09/20/23  4:15 AM  Result Value Ref Range   Heparin  Unfractionated 0.23 (L) 0.30 - 0.70 IU/mL  Lipoprotein A (LPA)   Collection Time: 09/20/23  4:15 AM  Result Value Ref Range   Lipoprotein (a) 106.5 (H) <75.0 nmol/L  CBC   Collection Time: 09/21/23  4:33 AM  Result Value Ref Range   WBC 9.6 4.0 - 10.5 K/uL   RBC 4.47 4.22 - 5.81 MIL/uL   Hemoglobin 12.8 (L) 13.0 - 17.0 g/dL   HCT 44.0 10.2 - 72.5 %   MCV 92.2 80.0 - 100.0 fL   MCH 28.6 26.0 - 34.0 pg   MCHC 31.1 30.0 - 36.0 g/dL   RDW 36.6 (H) 44.0 - 34.7 %   Platelets 286 150 - 400 K/uL   nRBC 0.0 0.0 - 0.2 %  Basic metabolic panel   Collection Time: 09/21/23  4:33 AM  Result Value Ref Range   Sodium 136 135 - 145 mmol/L   Potassium 4.1 3.5 - 5.1 mmol/L   Chloride 105 98 - 111 mmol/L   CO2 25 22 - 32 mmol/L   Glucose, Bld 112 (H) 70 - 99 mg/dL   BUN 21 8 - 23 mg/dL   Creatinine, Ser 4.25 0.61 - 1.24 mg/dL   Calcium  8.4 (L) 8.9 - 10.3 mg/dL   GFR, Estimated >95 >63 mL/min   Anion gap 6 5 - 15  ECHO TEE   Collection Time: 09/21/23  8:59 AM  Result Value Ref Range   Ao pk vel 1.59 m/s   AV Mean grad 4.0 mmHg   AV Peak grad 10.1 mmHg   Est EF 25 - 30%   CBC   Collection Time: 09/22/23  6:24 AM  Result Value Ref Range   WBC 8.8 4.0 - 10.5 K/uL   RBC 5.01 4.22 - 5.81 MIL/uL   Hemoglobin 14.2 13.0 - 17.0 g/dL   HCT 87.5 64.3 - 32.9 %   MCV 92.2 80.0 - 100.0 fL   MCH 28.3 26.0 - 34.0 pg   MCHC 30.7 30.0 - 36.0 g/dL   RDW 51.8 (H) 84.1 - 66.0 %   Platelets 339 150 - 400 K/uL   nRBC 0.0 0.0 - 0.2 %  Basic metabolic panel   Collection Time: 09/22/23  6:24 AM  Result Value Ref Range   Sodium 135 135 - 145  mmol/L   Potassium 4.3 3.5 - 5.1 mmol/L   Chloride 103 98 - 111 mmol/L   CO2 24 22 - 32 mmol/L   Glucose, Bld 110 (H) 70 - 99 mg/dL   BUN 18 8 - 23 mg/dL   Creatinine, Ser 6.30 0.61 - 1.24 mg/dL   Calcium  8.8 (L) 8.9 - 10.3 mg/dL   GFR, Estimated >16 >01 mL/min   Anion gap 8 5 - 15      Assessment & Plan:   Problem List Items Addressed This Visit   None Visit Diagnoses       Infected epidermoid cyst    -  Primary   Relevant Medications   doxycycline (VIBRA-TABS) 100 MG tablet   mupirocin ointment (BACTROBAN) 2 %   Other Relevant Orders   Ambulatory referral to General Surgery       Infected skin cyst, epidermal, LEFT low back Large infected cyst on back with central drainage, requires surgical intervention given size and lack of resolution from drainage thus far.  He is comfortable without systemic symptoms. Does not seem to have any cellulitis or  severe abscess. It may be underlying sebaceous cyst material mostly, may not actually have significant bacterial involvement, but we will cover empiric with treatment as well.  - Refer to general surgeon for cyst removal to remove entire cyst and ensure proper wound management closure hemostasis given size, procedure only with local anesthesia unless surgeons determine different course of action  Coverage while wait on excision from surgery - Prescribe doxycycline 100 mg orally twice daily for 10 days. - Prescribe mupirocin ointment for topical use up to 10 days or until healed. - Advise against using mupirocin on open draining area.  - Advise to cover draining cyst.       Orders Placed This Encounter  Procedures   Ambulatory referral to General Surgery    Referral Priority:   Routine    Referral Type:   Surgical    Referral Reason:   Specialty Services Required    Requested Specialty:   General Surgery    Number of Visits Requested:   1    Meds ordered this encounter  Medications   doxycycline (VIBRA-TABS) 100 MG  tablet    Sig: Take 1 tablet (100 mg total) by mouth 2 (two) times daily. For 10 days. Take with full glass of water, stay upright 30 min after taking.    Dispense:  20 tablet    Refill:  0   mupirocin ointment (BACTROBAN) 2 %    Sig: Apply 1 Application topically 2 (two) times daily. For up to 10 days or until healed, may repeat if needed.    Dispense:  22 g    Refill:  0    Follow up plan: Return if symptoms worsen or fail to improve.   Domingo Friend, DO [facility] 12/05/2023, 2:35 PM

## 2023-12-05 NOTE — Patient Instructions (Addendum)
 Thank you for coming to the office today.  Likely infected epidermal cyst of back. It does not seem to be spreading, but it has not resolved with current drainage  Referral to [facility]  They will call you with an apt, this will be an in office procedure, local anesthesia, no actual surgery, they usually will open and drain and remove the cyst.  [address] ,[place] [address] Phone: [phone]  Start antibiotics while waiting on general surgery apt  Start taking Doxycycline antibiotic 100mg  twice daily for 10 days. Take with full glass of water and stay upright for at least 30 min after taking, may be seated or standing, but should NOT lay down. This is just a safety precaution, if this medicine does not go all the way down throat well it could cause some burning discomfort to throat and esophagus. Also NOTE - do not take medicine within 2 hours (before or after) consuming dairy or foods / vitamins containing high calcium  or iron.  Topical antibiotic ointment on surface - external use only, not over the opening.  If not improving you may need to return for re-evaluation. But if more severe worsening such as spreading redness or streaking redness, significantly larger size, persistent drainage of pus, increased pain, fevers/chills, nausea vomiting and cannot take antibiotic. If significantly worse symptoms or most of these symptoms, would recommend going straight to Hospital Emergency Dept as you may require IV antibiotics instead.  Please schedule a Follow-up Appointment to: Return if symptoms worsen or fail to improve.  If you have any other questions or concerns, please feel free to call the office or send a message through MyChart. You may also schedule an earlier appointment if necessary.  Additionally, you may be receiving a survey about your experience at our office within a few days to 1 week by e-mail or mail. We value your feedback.  Domingo Friend, DO [facility], [place]

## 2023-12-05 NOTE — Telephone Encounter (Signed)
  Chief Complaint: Boil to back Symptoms: reports wife has been draining boil 2-3 times a day Frequency: 2-3 weeks Pertinent Negatives: Patient denies fever, CP, SOB Disposition: [] ED /[] Urgent Care (no appt availability in office) / [x] Appointment(In office/virtual)/ []  [facility]/ [] Home Care/ [] Refused Recommended Disposition /[] [facility]/ []  Follow-up with PCP Additional Notes: patient with complex medical history-home health nurse Ana called initially to report that patient had a boil on his back that his wife had been draining at home. Before transfer to Nurse triage, home health RN hung up on agent. This RN called patient at home. Patient reported boil to his back that is below his shoulder. Boil has been on back for 2-3 weeks. Patient states he is unable to reach the site-unable to say how big the site it. Patient reports his wife has been draining the boil two to three times a day. Patient denies any pain, fever, CP or SOB. Per protocol patient is recommended to be seen in 24 hours. With patient history, appointment made for today, 12/05/2023, at 2:20 PM to be seen by PCP. Patient verbalized understanding of plan and all questions answered.    Copied from CRM [id number]. Topic: Clinical - Red Word Triage >> Dec 05, 2023 11:03 AM Rennis Case wrote: Red Word that prompted transfer to Nurse Triage: Boil on lower back, pain when leaning on surfaces, wife tried to drain it at home Reason for Disposition  [1] Boil AND [2] diabetes mellitus or weak immune system (e.g., HIV positive, cancer chemo, splenectomy, organ transplant, chronic steroids)  Answer Assessment - Initial Assessment Questions 1. APPEARANCE of BOIL: "What does the boil look like?"      *No Answer* 2. LOCATION: "Where is the boil located?"      Upper back from shoulder blade unreachable for patient 3. NUMBER: "How many boils are there?"      1 4. SIZE: "How big is the boil?" (e.g., inches, cm; compare to size  of a coin or other object)     Was big but patient states it has gone down.  5. ONSET: "When did the boil start?"     Couple of weeks ago 6. PAIN: "Is there any pain?" If Yes, ask: "How bad is the pain?"   (Scale 1-10; or mild, moderate, severe)     No 7. FEVER: "Do you have a fever?" If Yes, ask: "What is it, how was it measured, and when did it start?"      no 8. SOURCE: "Have you been around anyone with boils or other Staph infections?" "Have you ever had boils before?"     unsure 9. OTHER SYMPTOMS: "Do you have any other symptoms?" (e.g., shaking chills, weakness, rash elsewhere on body)     no  Protocols used: Boil (Skin Abscess)-A-AH

## 2023-12-15 ENCOUNTER — Ambulatory Visit: Payer: Self-pay | Admitting: General Surgery

## 2023-12-18 ENCOUNTER — Encounter: Payer: Self-pay | Admitting: General Surgery

## 2023-12-18 ENCOUNTER — Ambulatory Visit
Admission: RE | Admit: 2023-12-18 | Discharge: 2023-12-18 | Disposition: A | Discharge status: Home / Self Care | Attending: General Surgery | Admitting: General Surgery

## 2023-12-18 ENCOUNTER — Other Ambulatory Visit: Payer: Self-pay

## 2023-12-18 ENCOUNTER — Encounter
Admission: RE | Disposition: A | Discharge status: Home / Self Care | Payer: Self-pay | Source: Home / Self Care | Attending: General Surgery

## 2023-12-18 ENCOUNTER — Ambulatory Visit: Admitting: Anesthesiology

## 2023-12-18 DIAGNOSIS — I2510 Atherosclerotic heart disease of native coronary artery without angina pectoris: Secondary | ICD-10-CM | POA: Insufficient documentation

## 2023-12-18 DIAGNOSIS — Z86718 Personal history of other venous thrombosis and embolism: Secondary | ICD-10-CM | POA: Insufficient documentation

## 2023-12-18 DIAGNOSIS — D367 Benign neoplasm of other specified sites: Secondary | ICD-10-CM | POA: Diagnosis present

## 2023-12-18 DIAGNOSIS — R011 Cardiac murmur, unspecified: Secondary | ICD-10-CM | POA: Diagnosis not present

## 2023-12-18 DIAGNOSIS — M1990 Unspecified osteoarthritis, unspecified site: Secondary | ICD-10-CM | POA: Diagnosis not present

## 2023-12-18 DIAGNOSIS — I4891 Unspecified atrial fibrillation: Secondary | ICD-10-CM | POA: Diagnosis not present

## 2023-12-18 DIAGNOSIS — I509 Heart failure, unspecified: Secondary | ICD-10-CM | POA: Insufficient documentation

## 2023-12-18 DIAGNOSIS — K649 Unspecified hemorrhoids: Secondary | ICD-10-CM | POA: Diagnosis not present

## 2023-12-18 DIAGNOSIS — Z8546 Personal history of malignant neoplasm of prostate: Secondary | ICD-10-CM | POA: Diagnosis not present

## 2023-12-18 DIAGNOSIS — Z87891 Personal history of nicotine dependence: Secondary | ICD-10-CM | POA: Diagnosis not present

## 2023-12-18 DIAGNOSIS — Z7901 Long term (current) use of anticoagulants: Secondary | ICD-10-CM | POA: Diagnosis not present

## 2023-12-18 DIAGNOSIS — K219 Gastro-esophageal reflux disease without esophagitis: Secondary | ICD-10-CM | POA: Insufficient documentation

## 2023-12-18 DIAGNOSIS — Z5986 Financial insecurity: Secondary | ICD-10-CM | POA: Insufficient documentation

## 2023-12-18 HISTORY — PX: EXCISION OF BACK LESION: SHX6597

## 2023-12-18 SURGERY — EXCISION, LESION, BACK
Anesthesia: General

## 2023-12-18 MED ORDER — HYDROCODONE-ACETAMINOPHEN 5-325 MG PO TABS: 1.0000 | ORAL_TABLET | Freq: Four times a day (QID) | ORAL | 0 refills | Status: AC | PRN

## 2023-12-18 MED ORDER — BUPIVACAINE-EPINEPHRINE 0.5% -1:200000 IJ SOLN
INTRAMUSCULAR | Status: DC | PRN
  Administered 2023-12-18: 30 mL

## 2023-12-18 MED ORDER — CHLORHEXIDINE GLUCONATE 0.12 % MT SOLN
15.0000 mL | Freq: Once | OROMUCOSAL | Status: AC
  Administered 2023-12-18: 15 mL via OROMUCOSAL

## 2023-12-18 MED ORDER — PROPOFOL 10 MG/ML IV BOLUS
INTRAVENOUS | Status: AC
  Filled 2023-12-18: qty 20

## 2023-12-18 MED ORDER — BUPIVACAINE-EPINEPHRINE (PF) 0.5% -1:200000 IJ SOLN
INTRAMUSCULAR | Status: AC
  Filled 2023-12-18: qty 30

## 2023-12-18 MED ORDER — CHLORHEXIDINE GLUCONATE 0.12 % MT SOLN
OROMUCOSAL | Status: AC
  Filled 2023-12-18: qty 15

## 2023-12-18 MED ORDER — CEFAZOLIN SODIUM-DEXTROSE 2-4 GM/100ML-% IV SOLN
2.0000 g | INTRAVENOUS | Status: AC
  Administered 2023-12-18: 2 g via INTRAVENOUS

## 2023-12-18 MED ORDER — ORAL CARE MOUTH RINSE: 15.0000 mL | Freq: Once | OROMUCOSAL | Status: AC

## 2023-12-18 MED ORDER — CEFAZOLIN SODIUM-DEXTROSE 2-4 GM/100ML-% IV SOLN
INTRAVENOUS | Status: AC
  Filled 2023-12-18: qty 100

## 2023-12-18 MED ORDER — SODIUM CHLORIDE 0.9 % IV SOLN: INTRAVENOUS | Status: DC | PRN

## 2023-12-18 MED ORDER — PROPOFOL 500 MG/50ML IV EMUL
INTRAVENOUS | Status: DC | PRN
  Administered 2023-12-18: 50 mg via INTRAVENOUS
  Administered 2023-12-18: 100 ug/kg/min via INTRAVENOUS

## 2023-12-18 MED ORDER — IRRISEPT - 450ML BOTTLE WITH 0.05% CHG IN STERILE WATER, USP 99.95% OPTIME
TOPICAL | Status: DC | PRN
  Administered 2023-12-18: 450 mL

## 2023-12-18 MED ORDER — LACTATED RINGERS IV SOLN: INTRAVENOUS | Status: DC

## 2023-12-18 SURGICAL SUPPLY — 25 items
CHLORAPREP W/TINT 26 (MISCELLANEOUS) IMPLANT
CNTNR URN SCR LID CUP LEK RST (MISCELLANEOUS) IMPLANT
DERMABOND ADVANCED .7 DNX12 (GAUZE/BANDAGES/DRESSINGS) ×1 IMPLANT
DRAPE LAPAROTOMY 100X77 ABD (DRAPES) ×1 IMPLANT
ELECTRODE REM PT RTRN 9FT ADLT (ELECTROSURGICAL) ×1 IMPLANT
GAUZE SPONGE 4X4 12PLY STRL (GAUZE/BANDAGES/DRESSINGS) IMPLANT
GLOVE BIO SURGEON STRL SZ 6.5 (GLOVE) ×1 IMPLANT
GLOVE BIOGEL PI IND STRL 6.5 (GLOVE) ×1 IMPLANT
GOWN STRL REUS W/ TWL LRG LVL3 (GOWN DISPOSABLE) ×2 IMPLANT
KIT TURNOVER KIT A (KITS) ×1 IMPLANT
LABEL OR SOLS (LABEL) ×1 IMPLANT
LAVAGE JET IRRISEPT WOUND (IRRIGATION / IRRIGATOR) IMPLANT
MANIFOLD NEPTUNE II (INSTRUMENTS) ×1 IMPLANT
NDL HYPO 25X1 1.5 SAFETY (NEEDLE) ×1 IMPLANT
NEEDLE HYPO 25X1 1.5 SAFETY (NEEDLE) ×1 IMPLANT
NS IRRIG 500ML POUR BTL (IV SOLUTION) ×1 IMPLANT
PACK BASIN MINOR ARMC (MISCELLANEOUS) ×1 IMPLANT
SUT ETHILON 2 0 FS 18 (SUTURE) IMPLANT
SUT ETHILON 3-0 (SUTURE) IMPLANT
SUT VIC AB 3-0 SH 27X BRD (SUTURE) ×1 IMPLANT
SUTURE MNCRL 4-0 27XMF (SUTURE) ×1 IMPLANT
SWAB CULTURE AMIES ANAERIB BLU (MISCELLANEOUS) IMPLANT
SYR 10ML LL (SYRINGE) ×1 IMPLANT
TRAP FLUID SMOKE EVACUATOR (MISCELLANEOUS) ×1 IMPLANT
WATER STERILE IRR 500ML POUR (IV SOLUTION) ×1 IMPLANT

## 2023-12-18 NOTE — Anesthesia Postprocedure Evaluation (Signed)
 Anesthesia Post Note  Patient: Keith Bates  Procedure(s) Performed: EXCISION, LESION, BACK  Patient location during evaluation: PACU Anesthesia Type: General Level of consciousness: awake and alert, oriented and patient cooperative Pain management: pain level controlled Vital Signs Assessment: post-procedure vital signs reviewed and stable Respiratory status: spontaneous breathing, nonlabored ventilation and respiratory function stable Cardiovascular status: blood pressure returned to baseline and stable Postop Assessment: adequate PO intake Anesthetic complications: no   There were no known notable events for this encounter.   Last Vitals:  Vitals:   12/18/23 1430 12/18/23 1445  BP: 131/62 137/86  Pulse: 88 (!) 56  Resp: 17 11  Temp:    SpO2: 98% 97%    Last Pain:  Vitals:   12/18/23 1430  TempSrc:   PainSc: 0-No pain                 Dorothey Gate

## 2023-12-18 NOTE — Patient Instructions (Signed)
 Keith Bates

## 2023-12-18 NOTE — Progress Notes (Signed)
 Notified Dr. Glyn Laser of atrial flutter, patient is at baseline of Atrial Flutter with PVC's.

## 2023-12-18 NOTE — Transfer of Care (Signed)
 Immediate Anesthesia Transfer of Care Note  Patient: Donnajean Fuse  Procedure(s) Performed: EXCISION, LESION, BACK  Patient Location: PACU  Anesthesia Type:General  Level of Consciousness: awake  Airway & Oxygen Therapy: Patient Spontanous Breathing  Post-op Assessment: Report given to RN and Post -op Vital signs reviewed and stable  Post vital signs: Reviewed and stable  Last Vitals:  Vitals Value Taken Time  BP 126/75 12/18/23 1412  Temp    Pulse 35 12/18/23 1415  Resp 16 12/18/23 1415  SpO2 100 % 12/18/23 1415  Vitals shown include unfiled device data.  Last Pain:  Vitals:   12/18/23 1200  TempSrc: Temporal         Complications: There were no known notable events for this encounter.

## 2023-12-18 NOTE — Op Note (Signed)
 OPERATION REPORT  Pre Operative Diagnosis: Excision of large epidermoid cyst  Post operative diagnosis: Same  Anesthesia: MAC and Local   Surgeon: Dr. Dortha Gauss   Indication: This 83 y.o. 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    Description of procedure: after orienting patient about the procedure steps and benefits and patient agreed to proceed. Time out was done identifying correct patient and location of procedure. After induction of monitored sedation, local anesthesia was infiltrated around the palpable lesion on the back. With a blade #15, an elliptical incision was made using the skin lines. Sharp dissection was carried down and lesion was excised including dermal tissue. The mass measured 8 cm. Three interrupted 2-0 nylon mattress stitches were placed to approximate the skin without complete closure of the skin. Cultures and  Specimen sent to pathology.    Complications: none   EBL: minimal  Eldred Grego, MD, FACS

## 2023-12-18 NOTE — H&P (Signed)
 History of Present Illness Keith Bates is an 83 year old male who presents with infected epidermoid cysts on his back. He is accompanied by his wife. He was referred by Dr. Romeo Co for evaluation of infected epidermoid cysts on his back.  He has had infected epidermoid cysts on his back for approximately two to three weeks. The cysts have been a long-standing issue, with a smaller cyst likely present for years before becoming infected. The cysts have ruptured, and he recalls a previous similar cyst that released a significant amount of material.  He is currently on antibiotics for the infection, with a treatment course of two weeks, and has been on them for about a week. His daughter assists with applying a salve to the area two to three times daily. He notes difficulty maintaining hygiene due to the location of the cysts, as he cannot see or reach the area on his back.  His medical history includes atrial fibrillation, for which he takes Eliquis . He also has a history of prostate cancer, and continues to receive treatment. He mentions having hemorrhoids and a previous recommendation against hemorrhoid surgery.  No direct symptoms related to the cysts other than the infection and rupture. He has allergies affecting his nose, which he mentions in the context of maintaining hygiene.   PAST MEDICAL HISTORY:  Past Medical History:  Diagnosis Date  Pre-diabetes  Prostate CA (CMS/HHS-HCC)     PAST SURGICAL HISTORY:  Past Surgical History:  Procedure Laterality Date  prostate surgery    MEDICATIONS:  Outpatient Encounter Medications as of 12/12/2023  Medication Sig Dispense Refill  acetaminophen  (TYLENOL ) 500 MG tablet Take by mouth  apixaban  (ELIQUIS ) 5 mg tablet Take 5 mg by mouth 2 (two) times daily  aspirin  81 MG EC tablet Take 81 mg by mouth once daily  atorvastatin  (LIPITOR ) 80 MG tablet Take 1 tablet by mouth at bedtime  bisacodyL  (DULCOLAX) 5 mg EC tablet Take 5 mg by mouth  once daily as needed  dapagliflozin  propanediol (FARXIGA ) 10 mg tablet Take 1 tablet by mouth once daily  doxycycline  (VIBRA -TABS) 100 MG tablet Take 100 mg by mouth  ENTRESTO  24-26 mg tablet Take 1 tablet by mouth 2 (two) times daily  flunisolide  (NASALIDE ) 25 mcg (0.025 %) nasal spray Place 2 sprays into one nostril  fluticasone  propionate (FLONASE ) 50 mcg/actuation nasal spray Place into one nostril once daily  HYDROcodone -acetaminophen  (NORCO) 5-325 mg tablet Take by mouth every 8 (eight) hours as needed  mexiletine (MEXITIL ) 200 MG capsule Take 1 capsule by mouth 2 (two) times daily  multivitamin tablet Take 1 tablet by mouth once daily  mupirocin  (BACTROBAN ) 2 % ointment Apply 1 Application topically 2 (two) times daily  spironolactone  (ALDACTONE ) 25 MG tablet Take 1 tablet by mouth once daily   No facility-administered encounter medications on file as of 12/12/2023.    ALLERGIES:  Ointment,white (obsolete); Other; and Penicillins  SOCIAL HISTORY:  Social History   Socioeconomic History  Marital status: Married  Tobacco Use  Smoking status: Never  Passive exposure: Never  Smokeless tobacco: Never  Vaping Use  Vaping status: Never Used  Substance and Sexual Activity  Alcohol use: Not Currently  Drug use: Never  Sexual activity: Defer   Social Drivers of Health   Financial Resource Strain: Medium Risk (12/12/2023)  Overall Financial Resource Strain (CARDIA)  Difficulty of Paying Living Expenses: Somewhat hard  Food Insecurity: No Food Insecurity (12/12/2023)  Hunger Vital Sign  Worried About Running Out of Food in the  Last Year: Never true  Ran Out of Food in the Last Year: Never true  Transportation Needs: No Transportation Needs (12/12/2023)  PRAPARE - Risk analyst (Medical): No  Lack of Transportation (Non-Medical): No   FAMILY HISTORY:  No family history on file.   GENERAL REVIEW OF SYSTEMS:   General ROS: negative for - chills, fatigue,  fever, weight gain or weight loss Allergy and Immunology ROS: negative for - hives  Hematological and Lymphatic ROS: negative for - bleeding problems or bruising, negative for palpable nodes Endocrine ROS: negative for - heat or cold intolerance, hair changes Respiratory ROS: negative for - cough, shortness of breath or wheezing Cardiovascular ROS: no chest pain or palpitations GI ROS: negative for nausea, vomiting, abdominal pain, diarrhea, constipation Musculoskeletal ROS: negative for - joint swelling or muscle pain Neurological ROS: negative for - confusion, syncope Dermatological ROS: negative for pruritus and rash  PHYSICAL EXAM:  Vitals:  12/12/23 1041  BP: 126/72  Pulse: 81  .  Ht:171.5 cm (5' 7.5") Wt:72.6 kg (160 lb) ZDG:UYQI surface area is 1.86 meters squared. Body mass index is 24.69 kg/m.Aaron Aas  GENERAL: Alert, active, oriented x3  HEENT: Pupils equal reactive to light. Extraocular movements are intact. Sclera clear. Palpebral conjunctiva normal red color.Pharynx clear.  NECK: Supple with no palpable mass and no adenopathy.  LUNGS: Sound clear with no rales rhonchi or wheezes.  HEART: Regular rhythm S1 and S2 without murmur.  BACK: 7 x 6 cm round cyst with associated erythema, localized, no drainage.  EXTREMITIES: Well-developed well-nourished symmetrical with no dependent edema.  NEUROLOGICAL: Awake alert oriented, facial expression symmetrical, moving all extremities.  Assessment & Plan Infected epidermoid cyst on back  He has an infected epidermoid cyst on his back, present for approximately three weeks, which has ruptured and is causing significant discomfort. Surgical intervention is recommended due to the size and infection. The incision may remain open due to infection, requiring daily cleaning and packing. This procedure will not affect his prostate cancer or its treatment. He is currently on antibiotics, and the decision to continue antibiotics or proceed with  surgery is pending discussion with his family. Discuss surgical removal of the cyst with him and his family. Consider surgical intervention to remove the cyst and clean the infection. If surgery is performed, attempt to close the incision if possible; if not, leave the wound open for daily cleaning and packing. Advise him to discuss with family before making the decision. Continue antibiotics and monitor for improvement if surgery is not immediately pursued. If surgery not performed, contact PCP for close monitoring of response to antibiotic therapy.   Patient called back and agreed to proceed with excision of cyst.   Infected epidermoid cyst [L72.0, L08.9]   Patient and his wife verbalized understanding, all questions were answered, and were agreeable with the plan outlined above.   Eldred Grego, MD

## 2023-12-18 NOTE — Anesthesia Preprocedure Evaluation (Addendum)
 Anesthesia Evaluation  Patient identified by MRN, date of birth, ID band Patient awake    Reviewed: Allergy & Precautions, NPO status , Patient's Chart, lab work & pertinent test results  History of Anesthesia Complications Negative for: history of anesthetic complications  Airway Mallampati: IV   Neck ROM: Full    Dental  (+) Missing, Poor Dentition   Pulmonary former smoker   Pulmonary exam normal breath sounds clear to auscultation       Cardiovascular + CAD and +CHF (EF 35-40%)  Normal cardiovascular exam+ dysrhythmias (a fib on Eliquis ) + Valvular Problems/Murmurs (mild to moderate AS)  Rhythm:Regular Rate:Normal  RLE DVT  ECG 09/21/23:  Atrial fibrillation with premature ventricular or aberrantly conducted complexes Septal infarct , age undetermined Possible Lateral infarct , age undetermined Inferior infarct , age undetermined   Neuro/Psych negative neurological ROS     GI/Hepatic ,GERD  ,,  Endo/Other  negative endocrine ROS    Renal/GU      Musculoskeletal  (+) Arthritis ,    Abdominal   Peds  Hematology   Anesthesia Other Findings Cardiology note 10/25/23:  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 Concerning for potential amyloid, though dose have multivessel CAD -  Continue Entresto  24-26 mg BID  - Continue spiro 25 mg daily - Continue farxiga  10 mg daily - No benefit to metoprolol  with bradycardia and amyloid - Euvolemic today - L/RHC 2/11: RA 5, PA 39/12 (22), PCWP 9, Fick CO/CI 3.5/1.9, PVR 3.7, PAPi 5.4. Severe multivessel CAD. - TEE EF 25-30% Severe BAE. + LAA clot - TTR genetics negative, reviewed with patient and wife - PYP scan pending - Therapy based on results, discuss at next visit - BMP at  next visit  2. CAD:  - LHC 2/12 with 3V CAD. Not CABG candidate. No targets for PCI -> medical therapy - Continue ASA/statin  3. Atrial flutter with slow VR:  - Continue Eliquis  5 mg BID - TEE with LAA clot so no DC-CV. Doubt would hold NSR with degree of LAE - Amio stopped in setting of conduction disease   4. Frequent PVCs/NSVT - Continue mexilitine   5. Aortic stenosis/mitral regurgitation: - AS is mild to moderate LF/LG AS   Follow up in 3 months   Reproductive/Obstetrics                             Anesthesia Physical Anesthesia Plan  ASA: 3  Anesthesia Plan: General   Post-op Pain Management:    Induction: Intravenous  PONV Risk Score and Plan: 2 and Propofol  infusion, TIVA and Treatment may vary due to age or medical condition  Airway Management Planned: Natural Airway  Additional Equipment:   Intra-op Plan:   Post-operative Plan:   Informed Consent: I have reviewed the patients History and Physical, chart, labs and discussed the procedure including the risks, benefits and alternatives for the proposed anesthesia with the patient or authorized representative who has indicated his/her understanding and acceptance.       Plan Discussed with: CRNA  Anesthesia Plan Comments: (LMA/GETA backup discussed.  Patient consented for risks of anesthesia including but not limited to:  - adverse reactions to medications - damage to eyes, teeth, lips or other oral mucosa - nerve damage due  to positioning  - sore throat or hoarseness - damage to heart, brain, nerves, lungs, other parts of body or loss of life  Informed patient about role of CRNA in peri- and intra-operative care.  Patient voiced understanding.)        Anesthesia Quick Evaluation

## 2023-12-19 ENCOUNTER — Encounter: Payer: Self-pay | Admitting: General Surgery

## 2023-12-19 LAB — SURGICAL PATHOLOGY

## 2023-12-23 LAB — AEROBIC/ANAEROBIC CULTURE W GRAM STAIN (SURGICAL/DEEP WOUND)
Culture: NO GROWTH
Culture: NO GROWTH
Culture: NO GROWTH
Gram Stain: NONE SEEN

## 2023-12-25 ENCOUNTER — Other Ambulatory Visit: Payer: Self-pay | Admitting: Family Medicine

## 2023-12-25 DIAGNOSIS — I824Y1 Acute embolism and thrombosis of unspecified deep veins of right proximal lower extremity: Secondary | ICD-10-CM

## 2023-12-25 DIAGNOSIS — I5020 Unspecified systolic (congestive) heart failure: Secondary | ICD-10-CM

## 2023-12-27 NOTE — Telephone Encounter (Signed)
 Requested medication (s) are due for refill today: yes   Requested medication (s) are on the active medication list: yes   Last refill:  eliquis  - 10/03/23 #60 2 refills , farxiga - 10/03/23 #30 2 refills  Future visit scheduled: yes in 6 days   Notes to clinic:   do you want to refill Rxs before OV?     Requested Prescriptions  Pending Prescriptions Disp Refills   ELIQUIS  5 MG TABS tablet [Pharmacy Med Name: ELIQUIS  5 MG TABLET] 60 tablet 2    Sig: TAKE 1 TABLET BY MOUTH TWICE A DAY     Hematology:  Anticoagulants - apixaban  Failed - 12/27/2023 12:09 PM      Failed - Valid encounter within last 12 months    Recent Outpatient Visits           3 weeks ago Infected epidermoid cyst   [place] [facility] Raina Bunting, DO   2 months ago Acute deep vein thrombosis (DVT) of proximal vein of right lower extremity [facility])   [place] [facility] [place], Kayleen Party, DO       Future Appointments             In 6 days Romeo Co, Kayleen Party, DO [facility]            Passed - PLT in normal range and within 360 days    Platelets  Date Value Ref Range Status  09/22/2023 339 150 - 400 K/uL Final         Passed - HGB in normal range and within 360 days    Hemoglobin  Date Value Ref Range Status  09/22/2023 14.2 13.0 - 17.0 g/dL Final         Passed - HCT in normal range and within 360 days    HCT  Date Value Ref Range Status  09/22/2023 46.2 39.0 - 52.0 % Final         Passed - Cr in normal range and within 360 days    Creat  Date Value Ref Range Status  07/06/2021 0.69 (L) 0.70 - 1.28 mg/dL Final   Creatinine, Ser  Date Value Ref Range Status  09/22/2023 0.84 0.61 - 1.24 mg/dL Final         Passed - AST in normal range and within 360 days    AST  Date Value Ref Range Status  09/17/2023 32 15 - 41 U/L Final         Passed - ALT in normal range and within 360 days    ALT   Date Value Ref Range Status  09/17/2023 36 0 - 44 U/L Final          FARXIGA  10 MG TABS tablet [Pharmacy Med Name: FARXIGA  10 MG TABLET] 30 tablet 2    Sig: TAKE 1 TABLET BY MOUTH EVERY DAY     Endocrinology:  Diabetes - SGLT2 Inhibitors Failed - 12/27/2023 12:09 PM      Failed - HBA1C is between 0 and 7.9 and within 180 days    Hgb A1c MFr Bld  Date Value Ref Range Status  01/10/2022 6.0 (H) <5.7 % of total Hgb Final    Comment:    For someone without known diabetes, a hemoglobin  A1c value between 5.7% and 6.4% is consistent with prediabetes and should be confirmed with a  follow-up test. . For someone with known diabetes, a value <7% indicates that  their diabetes is well controlled. A1c targets should be individualized based on duration of diabetes, age, comorbid conditions, and other considerations. . This assay result is consistent with an increased risk of diabetes. . Currently, no consensus exists regarding use of hemoglobin A1c for diagnosis of diabetes for children. .          Failed - Valid encounter within last 6 months    Recent Outpatient Visits           3 weeks ago Infected epidermoid cyst   [place] [facility] [place], Kayleen Party, DO   2 months ago Acute deep vein thrombosis (DVT) of proximal vein of right lower extremity [place] [facility])   [place] [facility] Romeo Co, Kayleen Party, DO       Future Appointments             In 6 days Romeo Co, Kayleen Party, DO [place] [facility], [facility]            Passed - Cr in normal range and within 360 days    Creat  Date Value Ref Range Status  07/06/2021 0.69 (L) 0.70 - 1.28 mg/dL Final   Creatinine, Ser  Date Value Ref Range Status  09/22/2023 0.84 0.61 - 1.24 mg/dL Final         Passed - eGFR in normal range and within 360 days    GFR, Est African American  Date Value Ref Range Status  04/27/2020 99 > OR = 60 mL/min/1.57m2 Final   GFR  calc Af Amer  Date Value Ref Range Status  05/18/2021 119.5  Final   GFR, Est Non African American  Date Value Ref Range Status  04/27/2020 85 > OR = 60 mL/min/1.96m2 Final   GFR, Estimated  Date Value Ref Range Status  09/22/2023 >60 >60 mL/min Final    Comment:    (NOTE) Calculated using the CKD-EPI Creatinine Equation (2021)    eGFR  Date Value Ref Range Status  07/06/2021 94 > OR = 60 mL/min/1.44m2 Final    Comment:    The eGFR is based on the CKD-EPI 2021 equation. To calculate  the new eGFR from a previous Creatinine or Cystatin C result, go to [email or web address].org/[email or web address] kdoqi/[email or web address]

## 2024-01-02 ENCOUNTER — Ambulatory Visit (INDEPENDENT_AMBULATORY_CARE_PROVIDER_SITE_OTHER): Payer: Medicare Other | Admitting: Family Medicine

## 2024-01-02 ENCOUNTER — Other Ambulatory Visit: Payer: Self-pay | Admitting: Family Medicine

## 2024-01-02 ENCOUNTER — Encounter: Payer: Self-pay | Admitting: Family Medicine

## 2024-01-02 VITALS — BP 130/80 | HR 60 | Ht 71.0 in | Wt 145.0 lb

## 2024-01-02 DIAGNOSIS — Z9889 Other specified postprocedural states: Secondary | ICD-10-CM

## 2024-01-02 DIAGNOSIS — I5020 Unspecified systolic (congestive) heart failure: Secondary | ICD-10-CM | POA: Diagnosis not present

## 2024-01-02 DIAGNOSIS — Z86018 Personal history of other benign neoplasm: Secondary | ICD-10-CM

## 2024-01-02 DIAGNOSIS — Z86718 Personal history of other venous thrombosis and embolism: Secondary | ICD-10-CM

## 2024-01-02 DIAGNOSIS — Z0000 Encounter for general adult medical examination without abnormal findings: Secondary | ICD-10-CM

## 2024-01-02 DIAGNOSIS — E7800 Pure hypercholesterolemia, unspecified: Secondary | ICD-10-CM

## 2024-01-02 DIAGNOSIS — I4891 Unspecified atrial fibrillation: Secondary | ICD-10-CM

## 2024-01-02 DIAGNOSIS — I4892 Unspecified atrial flutter: Secondary | ICD-10-CM

## 2024-01-02 DIAGNOSIS — R7303 Prediabetes: Secondary | ICD-10-CM

## 2024-01-02 DIAGNOSIS — Z8546 Personal history of malignant neoplasm of prostate: Secondary | ICD-10-CM

## 2024-01-02 MED ORDER — ATORVASTATIN CALCIUM 80 MG PO TABS: 80.0000 mg | ORAL_TABLET | Freq: Every day | ORAL | 3 refills | Status: AC

## 2024-01-02 MED ORDER — SPIRONOLACTONE 25 MG PO TABS: 25.0000 mg | ORAL_TABLET | Freq: Every day | ORAL | 3 refills | Status: AC

## 2024-01-02 MED ORDER — APIXABAN 5 MG PO TABS
5.0000 mg | ORAL_TABLET | Freq: Two times a day (BID) | ORAL | 11 refills | Status: AC
Start: 2024-01-02 — End: ?

## 2024-01-02 NOTE — Progress Notes (Addendum)
 Subjective:    Patient ID: Keith Bates, male    DOB: 01-Mar-1941, 83 y.o.   MRN: [id number]  Keith Bates is a 83 y.o. male presenting on 01/02/2024 for Congestive Heart Failure   HPI  Discussed the use of AI scribe software for clinical note transcription with the patient, who gave verbal consent to proceed.  History of Present Illness   Keith Bates is an 83 year old male with heart failure who presents for a follow-up visit.  He has been under the care of a heart specialist and had an echocardiogram in March showing a heart pumping function of 35-40%. He is on a regimen of heart medications, including Entresto  and spironolactone , which he is adhering to. He has no major swelling or fluid retention, and his weight has remained stable between 144 to 145 pounds since March. He recently received a refill for his blood thinner, Eliquis , which he continues to take due to his heart condition with atrial flutter, and was on this for DVT previously. He mentions that the cost of medications is a concern, but he has not run out of any medications yet. - Upcoming Cardiology 01/15/24  Hospital visit 12/18/23 per [facility] Surgery for Back Cyst Excision - Dr Dortha Gauss. He underwent surgery for a cyst on his back in May, which is still healing. The incision is still draining and itching. He is using a fabric tape to cover the wound, which sometimes irritates his skin.  Reports urination also triggers a bowel movement urge, but it does not lead to accidents.      12/05/2023    2:55 PM 12/30/2022    9:29 AM 01/10/2022    9:17 AM  Depression screen PHQ 2/9  Decreased Interest 0 0 0  Down, Depressed, Hopeless 0 0 0  PHQ - 2 Score 0 0 0  Altered sleeping 0 0 1  Tired, decreased energy 0 0 0  Change in appetite 0 0 0  Feeling bad or failure about yourself  0 0 0  Trouble concentrating 0 0 0  Moving slowly or fidgety/restless 0 0 0  Suicidal thoughts 0 0 0  PHQ-9 Score 0 0 1  Difficult doing work/chores   Not difficult at all Not difficult at all       12/05/2023    2:56 PM 01/10/2022    9:17 AM  GAD 7 : Generalized Anxiety Score  Nervous, Anxious, on Edge 1 0  Control/stop worrying 0 0  Worry too much - different things 0 0  Trouble relaxing 0 0  Restless 0 0  Easily annoyed or irritable 2 1  Afraid - awful might happen 0 0  Total GAD 7 Score 3 1  Anxiety Difficulty Somewhat difficult Not difficult at all    Social History   Tobacco Use   Smoking status: Former    Current packs/day: 0.00    Average packs/day: 1.5 packs/day for 10.0 years (15.0 ttl pk-yrs)    Types: Cigarettes    Start date: 06/06/1962    Quit date: 06/06/1972    Years since quitting: 51.6   Smokeless tobacco: Former  Building services engineer status: Former  Substance Use Topics   Alcohol use: No   Drug use: No    Review of Systems Per HPI unless specifically indicated above     Objective:     BP 130/80 (BP Location: Left Arm, Patient Position: Sitting, Cuff Size: Normal)   Pulse 60   Ht 5\' 11"  (1.803  m)   Wt 145 lb (65.8 kg)   SpO2 93%   BMI 20.22 kg/m   Wt Readings from Last 3 Encounters:  01/02/24 145 lb (65.8 kg)  12/05/23 148 lb (67.1 kg)  10/25/23 144 lb (65.3 kg)    Physical Exam Vitals and nursing note reviewed.  Constitutional:      General: He is not in acute distress.    Appearance: Normal appearance. He is well-developed. He is not diaphoretic.     Comments: Well-appearing, comfortable, cooperative  HENT:     Head: Normocephalic and atraumatic.  Eyes:     General:        Right eye: No discharge.        Left eye: No discharge.     Conjunctiva/sclera: Conjunctivae normal.  Neck:     Thyroid: No thyromegaly.  Cardiovascular:     Rate and Rhythm: Normal rate and regular rhythm.     Pulses: Normal pulses.     Heart sounds: Normal heart sounds. No murmur heard. Pulmonary:     Effort: Pulmonary effort is normal. No respiratory distress.     Breath sounds: Normal breath sounds.  No wheezing or rales.  Musculoskeletal:        General: Normal range of motion.     Cervical back: Normal range of motion and neck supple.     Right lower leg: No edema.     Left lower leg: No edema.  Lymphadenopathy:     Cervical: No cervical adenopathy.  Skin:    General: Skin is warm and dry.     Findings: Lesion (wound on mid back s/p excision by surgery, has healing open wound with sutures in place, some separation of wound no drainage.) present. No erythema or rash.  Neurological:     Mental Status: He is alert and oriented to person, place, and time. Mental status is at baseline.  Psychiatric:        Mood and Affect: Mood normal.        Behavior: Behavior normal.        Thought Content: Thought content normal.     Comments: Well groomed, good eye contact, normal speech and thoughts     Results for orders placed or performed during the hospital encounter of 12/18/23  Surgical pathology   Collection Time: 12/18/23 12:00 AM  Result Value Ref Range   SURGICAL PATHOLOGY      SURGICAL PATHOLOGY [facility] [address] [place], [place] [address] Telephone [phone] or [phone] Fax [phone]  REPORT OF SURGICAL PATHOLOGY   Accession #: [id number] Patient Name: Keith Bates Visit # : [id number]  MRN: [id number] Physician: Eldred Grego DOB/Age April 16, 1941 (Age: 81) Gender: M Collected Date: 12/18/2023 Received Date: 12/18/2023  FINAL DIAGNOSIS       1. Skin, Back mass :       SKIN WITH UNDERLING SUBCUTANEOUS TISSUE WITH ACUTE INFLAMMATION AND ABSCESS      FORMATION.      NO EVIDENCE OF NEOPLASM OR MALIGNANCY.       DATE SIGNED OUT: 12/19/2023 ELECTRONIC SIGNATURE : Keith Frame Md, Pathologist, Electronic Signature  MICROSCOPIC DESCRIPTION  CASE COMMENTS STAINS USED IN DIAGNOSIS: H&E H&E H&E H&E    CLINICAL HISTORY  SPECIMEN(S) OBTAINED 1. Skin, Back Mass  SPECIMEN COMMENTS: SPECIMEN CLINICAL  INFORMATION:    Gross Description 1. Specimen: "Back mass"; received  fresh, placed in formalin      Orientation: Unoriented ellipse; defect (deep margin, consistent  with surgical      artifact) inked orange, remaining specimen is inked blue      Size: 9.4 x 4.6 cm, excised to a depth of 2.0 cm      Lesion: 6.5 x 4.1 cm; ill-defined area of gray-tan discoloration abutting the      margins. Sectioning reveals a 4.5 x 3.0 x 1.5 cm ill-defined, diffusely softened      underlying area/cavity that directly communicates with the defect on the deep      margin.      Uninvolved tissue: Tan, mildly wrinkled skin; tan, solid, homogenous,      fibroadipose tissue on the cut surfaces.      Block summary: Representative sections are submitted in 4 block (1A-D).      AMG 12/18/2023        Report signed out from the following location(s) [place] [address], [place] [address] CLIA #: [id number]  [facility] [address] [place], [place] [address] CLIA #: [id number]   Aerobic/Anaerobic Culture w Gram Stain (surgical/deep wound)   Collection Time: 12/18/23  1:42 PM   Specimen: Wound; Abscess  Result Value Ref Range   Specimen Description ABSCESS    Special Requests BACK    Gram Stain NO WBC SEEN NO ORGANISMS SEEN     Culture      No growth aerobically or anaerobically. Performed at [facility], [address] [place], [place] [address]    Report Status 12/23/2023 FINAL   Aerobic/Anaerobic Culture w Gram Stain (surgical/deep wound)   Collection Time: 12/18/23  1:46 PM   Specimen: Wound; Body Fluid  Result Value Ref Range   Specimen Description ABSCESS BACK    Special Requests SAMPLE B    Gram Stain      ABUNDANT WBC PRESENT, PREDOMINANTLY PMN GRAM POSITIVE COCCI IN CLUSTERS    Culture      No growth aerobically or anaerobically. Performed at [facility], [address], [place], [place] [address]    Report  Status 12/23/2023 FINAL   Aerobic/Anaerobic Culture w Gram Stain (surgical/deep wound)   Collection Time: 12/18/23  1:48 PM   Specimen: Wound; Tissue  Result Value Ref Range   Specimen Description TISSUE BACK    Special Requests SAMPLE C    Gram Stain      FEW WBC PRESENT,BOTH PMN AND MONONUCLEAR NO ORGANISMS SEEN    Culture      No growth aerobically or anaerobically. Performed at [facility], [address] [place], [place] [address]    Report Status 12/23/2023 FINAL       Assessment & Plan:   Problem List Items Addressed This Visit     Flutter-fibrillation (HCC)   Relevant Medications   spironolactone  (ALDACTONE ) 25 MG tablet   atorvastatin  (LIPITOR ) 80 MG tablet   apixaban  (ELIQUIS ) 5 MG TABS tablet   Right leg DVT (HCC)   Relevant Medications   spironolactone  (ALDACTONE ) 25 MG tablet   atorvastatin  (LIPITOR ) 80 MG tablet   apixaban  (ELIQUIS ) 5 MG TABS tablet   Other Visit Diagnoses       Heart failure with reduced ejection fraction (HCC)    -  Primary   Relevant Medications   spironolactone  (ALDACTONE ) 25 MG tablet   atorvastatin  (LIPITOR ) 80 MG tablet   apixaban  (ELIQUIS ) 5 MG TABS tablet     H/O excision of dermoid cyst  Congestive Heart failure, Systolic HFrEF Followed by Cardiology [place] Chronic heart failure with reduced ejection fraction (35-40%). Well-managed on current medication regimen. Blood pressure stable. No significant fluid retention or weight gain. Continues cardiology follow-up 2 weeks  - Continue current heart failure medication regimen. Farxiga , Entresto , Spironolactone . Also on anticoagulation Eliquis  for Atrial Flutter and history of DVT. Continues on Aspirin  81 and Statin. - Ensure refills for heart medications are available. - Follow up with cardiology on June 9th.  History of 1st DVT Blood clot Managed with Eliquis  for anticoagulation and heart rhythm management. No discontinuation planned given will need  anticoagulation for A Flutter - Continue Eliquis  as prescribed. - Ensure refills for Eliquis  are available.  Postoperative wound drainage S/p cyst drainage on back / Postoperative wound on the back with ongoing drainage and some separation. No signs of new infection. Itching present. Advised to avoid heavy lifting and straining. - Keep wound covered and monitor for signs of infection. - Avoid heavy lifting and straining. - Follow up with surgeon for wound evaluation and potential revision if necessary.  General Health Maintenance Due for pneumonia vaccine. Discussed timing considering recent medical events. Advised to consider flu vaccine in the fall. - Receive Prevnar 20 pneumonia vaccine at pharmacy when ready. - Consider receiving flu vaccine in the fall.  Follow-up - Follow up with surgeon on May 29th. - Follow up with cardiology on June 9th. - Schedule annual wellness check with nurse.   No orders of the defined types were placed in this encounter.   Meds ordered this encounter  Medications   spironolactone  (ALDACTONE ) 25 MG tablet    Sig: Take 1 tablet (25 mg total) by mouth daily.    Dispense:  90 tablet    Refill:  3    Add future refills   atorvastatin  (LIPITOR ) 80 MG tablet    Sig: Take 1 tablet (80 mg total) by mouth at bedtime.    Dispense:  90 tablet    Refill:  3    Add future refills   apixaban  (ELIQUIS ) 5 MG TABS tablet    Sig: Take 1 tablet (5 mg total) by mouth 2 (two) times daily.    Dispense:  60 tablet    Refill:  11    Add refills    Follow up plan: Return for 6 month fasting lab > 1 week later Annual Physical.  Future labs ordered for 06/12/24  Domingo Friend, DO [facility] 01/02/2024, 2:35 PM

## 2024-01-02 NOTE — Patient Instructions (Addendum)
 Thank you for coming to the office today.  Prevnar-20 pneumonia vaccine whenever you are ready between now and Fall 2025  Refilled medications for future.  Continue blood thinner Eliquis .  Keep up with specialists, General Surgery tomorrow  Avoid strenuous physical activity lifting and turning.  Keep up with Cardiologist 01/15/24  DUE for FASTING BLOOD WORK (no food or drink after midnight before the lab appointment, only water  or coffee without cream/sugar on the morning of)  SCHEDULE "Lab Only" visit in the morning at the clinic for lab draw in 6 MONTHS   - Make sure Lab Only appointment is at about 1 week before your next appointment, so that results will be available  For Lab Results, once available within 2-3 days of blood draw, you can can log in to MyChart online to view your results and a brief explanation. Also, we can discuss results at next follow-up visit.   Please schedule a Follow-up Appointment to: Return for 6 month fasting lab > 1 week later Annual Physical.  If you have any other questions or concerns, please feel free to call the office or send a message through MyChart. You may also schedule an earlier appointment if necessary.  Additionally, you may be receiving a survey about your experience at our office within a few days to 1 week by e-mail or mail. We value your feedback.  Domingo Friend, DO [facility] [place], [place]

## 2024-01-12 ENCOUNTER — Telehealth: Payer: Self-pay | Admitting: Family

## 2024-01-12 NOTE — Telephone Encounter (Signed)
 Called to confirm/remind patient of their appointment at the [facility] on 01/15/24.   Appointment:   [] Confirmed  [x] Left mess   [] No answer/No voice mail  [] VM Full/unable to leave message  [] Phone not in service  Patient reminded to bring all medications and/or complete list.  Confirmed patient has transportation. Gave directions, instructed to utilize valet parking.

## 2024-01-15 ENCOUNTER — Ambulatory Visit: Attending: Cardiology | Admitting: Cardiology

## 2024-01-15 VITALS — BP 116/80 | HR 62 | Wt 142.2 lb

## 2024-01-15 DIAGNOSIS — I4819 Other persistent atrial fibrillation: Secondary | ICD-10-CM | POA: Diagnosis not present

## 2024-01-15 NOTE — Patient Instructions (Addendum)
 Medication Changes:  No medication changes.   Follow-Up in: 3 months with Dr. Alease Amend.  Our Doctors' schedules are NOT open yet for 3 months. We will place you on our recall list. Once they are available, we will call you to schedule your follow up appointment.   At the [facility], you and your health needs are our priority. We have a designated team specialized in the treatment of Heart Failure. This Care Team includes your primary Heart Failure Specialized Cardiologist (physician), Advanced Practice Providers (APPs- Physician Assistants and Nurse Practitioners), and Pharmacist who all work together to provide you with the care you need, when you need it.   You may see any of the following providers on your designated Care Team at your next follow up:  Dr. Jules Oar Dr. Peder Bourdon Dr. Alwin Baars Dr. Judyth Nunnery Shawnee Dellen, FNP Bevely Brush, RPH-CPP  Please be sure to bring in all your medications bottles to every appointment.   Need to Contact Us :  If you have any questions or concerns before your next appointment please send us  a message through [place] or call our office at [phone].    TO LEAVE A MESSAGE FOR THE NURSE SELECT OPTION 2, PLEASE LEAVE A MESSAGE INCLUDING: YOUR NAME DATE OF BIRTH CALL BACK NUMBER REASON FOR CALL**this is important as we prioritize the call backs  YOU WILL RECEIVE A CALL BACK THE SAME DAY AS LONG AS YOU CALL BEFORE 4:00 PM

## 2024-01-17 NOTE — Progress Notes (Signed)
 ADVANCED HEART FAILURE FOLLOW UP CLINIC NOTE  Referring Physician: Domingo Friend *  Primary Care: Raina Bunting, DO Primary Cardiologist:  HPI: Keith Bates is a 83 y.o. male with a PMH of arthritis, prostate CA on hormonal therapy, recent diagnosis of  who presents for follow up of chronic systolic heart failure.      Patient was admitted on 09/15/23 with several months of progressive HF symptoms    In ER he was noted to be hypertensive with a blood pressure of 167/87 with atrial flutter.   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   Patient underwent left heart catheterization that showed severe, multivessel CAD.  In addition, right heart catheterization with normal filling pressures and severely reduced cardiac index.  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     SUBJECTIVE:  Patient continues to do well. He has been walking more around his driveway, is making progress. He denies any worsening shortness of breath, weight gain, chest pain, orthopnea, PND.  He has been taking his medications as prescribed.  His blood pressure remains well-controlled at home and he denies any recent syncopal events or dizzy spells.  PMH, current medications, allergies, social history, and family history reviewed in epic.  PHYSICAL EXAM: Vitals:   01/15/24 0934  BP: 116/80  Pulse: 62  SpO2: 100%   GENERAL: Chronically ill-appearing PULM:  Normal work of breathing, clear to auscultation bilaterally. Respirations are unlabored.  CARDIAC:  JVP: Flat          Bradycardic, irregular rhythm. No murmurs, rubs or gallops.  Trace lower extremity edema. Warm and well perfused extremities. ABDOMEN: Soft, non-tender, non-distended. NEUROLOGIC: Patient is oriented x3 with no focal or lateralizing neurologic deficits.   Data Review:   CMR: cMRI LVEF 33%, diffuse subendocardial LGE, mod dilated LA/RA, mod dilated RV, normal RV function. 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 Still awaiting PYP scan but no other amyloid symptoms -  Continue entresto  24/26mg  BID, spironolactone  25mg  daily, farxiga  10mg  daily for HC=F - HR too low for BB at this time - Euvolemic today - L/RHC 2/11: RA 5, PA 39/12 (22), PCWP 9, Fick CO/CI 3.5/1.9, PVR 3.7, PAPi 5.4. Severe multivessel CAD. - TEE EF 25-30% Severe BAE. + LAA clot - TTR genetics negative, reviewed with patient and wife - PYP scan pending - No indication for endomyocardial biopsy, doing well - Labs ordered at PCP  2. CAD:  - LHC 2/12 with 3V CAD. Not CABG candidate. No targets for PCI -> medical therapy - Continue ASA/statin - Consider dropping aspirin  at 1 year  3. Atrial flutter with slow VR:  - Continue eliquis  5mg  BID - TEE with LAA clot so no DC-CV. Doubt would hold NSR with degree of  LAE - Amio stopped in setting of conduction disease - Remains in atrial flutter with controlled rate   4. Frequent PVCs/NSVT - Continue mexilitine 200mg  BID   5. Aortic stenosis/mitral regurgitation: - AS is mild to moderate LF/LG AS  Follow up in 3 months  Arta Lark, MD Advanced Heart Failure Mechanical Circulatory Support 01/17/24

## 2024-02-16 ENCOUNTER — Ambulatory Visit

## 2024-02-16 DIAGNOSIS — Z0000 Encounter for general adult medical examination without abnormal findings: Secondary | ICD-10-CM

## 2024-02-16 NOTE — Patient Instructions (Addendum)
 Keith Bates , Thank you for taking time out of your busy schedule to complete your Annual Wellness Visit with me. I enjoyed our conversation and look forward to speaking with you again next year. I, as well as your care team,  appreciate your ongoing commitment to your health goals. Please review the following plan we discussed and let me know if I can assist you in the future.   Follow up Visits: Next Medicare AWV with our clinical staff:   02/21/25 @ 10:10 AM BY PHONE Have you seen your provider in the last 6 months (3 months if uncontrolled diabetes)? Yes  Clinician Recommendations:  Aim for 30 minutes of exercise or brisk walking, 6-8 glasses of water , and 5 servings of fruits and vegetables each day. TAKE CARE!      This is a list of the screening recommended for you and due dates:  Health Maintenance  Topic Date Due   DTaP/Tdap/Td vaccine (1 - Tdap) Never done   Zoster (Shingles) Vaccine (1 of 2) Never done   Pneumococcal Vaccine for age over 62 (2 of 2 - PPSV23, PCV20, or PCV21) 03/12/2019   COVID-19 Vaccine (4 - 2024-25 season) 04/09/2023   Flu Shot  03/08/2024   Medicare Annual Wellness Visit  02/15/2025   Hepatitis B Vaccine  Aged Out   HPV Vaccine  Aged Out   Meningitis B Vaccine  Aged Out    Advanced directives: (ACP Link)Information on Advanced Care Planning can be found at [employer] Advance Health Care Directives Advance Health Care Directives. [email or web address].[email or web address]/  Advance Care Planning is important because it:  [x]  Makes sure you receive the medical care that is consistent with your values, goals, and preferences  [x]  It provides guidance to your family and loved ones and reduces their decisional burden about whether or not they are making the right decisions based on your wishes.  Follow the link provided in your after visit summary or read over the paperwork we have mailed to you to help you started getting your Advance Directives in place. If you need  assistance in completing these, please reach out to us  so that we can help you!

## 2024-02-16 NOTE — Progress Notes (Signed)
 Subjective:   Keith Bates is a 83 y.o. who presents for a Medicare Wellness preventive visit.  As a reminder, Annual Wellness Visits don't include a physical exam, and some assessments may be limited, especially if this visit is performed virtually. We may recommend an in-person follow-up visit with your provider if needed.  Visit Complete: Virtual I connected with  Keith Bates on 02/16/24 by a audio enabled telemedicine application and verified that I am speaking with the correct person using two identifiers.  Patient Location: Home  Provider Location: Home Office  I discussed the limitations of evaluation and management by telemedicine. The patient expressed understanding and agreed to proceed.  Vital Signs: Because this visit was a virtual/telehealth visit, some criteria may be missing or patient reported. Any vitals not documented were not able to be obtained and vitals that have been documented are patient reported.  VideoDeclined- This patient declined Librarian, academic. Therefore the visit was completed with audio only.  Persons Participating in Visit: Patient.  AWV Questionnaire: No: Patient Medicare AWV questionnaire was not completed prior to this visit.  Cardiac Risk Factors include: advanced age (>59men, >57 women);dyslipidemia;male gender     Objective:    There were no vitals filed for this visit. There is no height or weight on file to calculate BMI.     02/16/2024   10:20 AM 12/18/2023   12:37 PM 09/15/2023   10:44 AM 12/30/2022    9:31 AM 10/15/2021    3:32 PM 09/01/2020    9:18 AM 08/27/2019    1:09 PM  Advanced Directives  Does Patient Have a Medical Advance Directive? No No No No No No No  Would patient like information on creating a medical advance directive? No - Patient declined No - Patient declined No - Patient declined No - Patient declined No - Patient declined      Current Medications (verified) Outpatient Encounter  Medications as of 02/16/2024  Medication Sig   apixaban  (ELIQUIS ) 5 MG TABS tablet Take 1 tablet (5 mg total) by mouth 2 (two) times daily.   aspirin  EC 81 MG tablet Take 1 tablet (81 mg total) by mouth daily. Swallow whole.   atorvastatin  (LIPITOR ) 80 MG tablet Take 1 tablet (80 mg total) by mouth at bedtime.   FARXIGA  10 MG TABS tablet TAKE 1 TABLET BY MOUTH EVERY DAY   flunisolide  (NASALIDE ) 25 MCG/ACT (0.025%) SOLN Place 2 sprays into the nose 2 (two) times daily.   fluticasone  (FLONASE ) 50 MCG/ACT nasal spray Place 1 spray into both nostrils 2 (two) times daily.   mexiletine (MEXITIL ) 200 MG capsule TAKE 1 CAPSULE BY MOUTH TWICE A DAY   sacubitril -valsartan  (ENTRESTO ) 24-26 MG Take 1 tablet by mouth 2 (two) times daily.   spironolactone  (ALDACTONE ) 25 MG tablet Take 1 tablet (25 mg total) by mouth daily.   No facility-administered encounter medications on file as of 02/16/2024.    Allergies (verified) Other, Plasticized base [plastibase], and Penicillins   History: Past Medical History:  Diagnosis Date   Arthritis    DDD (degenerative disc disease)    Enlarged prostate    Environmental allergies    GERD (gastroesophageal reflux disease)    Hx of ulcer disease    Loss of weight    Nocturia    Prostate cancer (HCC)    Unspecified hemorrhoids without mention of complication    Past Surgical History:  Procedure Laterality Date   CARDIOVERSION N/A 09/21/2023   Procedure: CARDIOVERSION;  Surgeon:  Bensimhon, Toribio SAUNDERS, MD;  Location: [facility];  Service: Cardiovascular;  Laterality: N/A;   COLONOSCOPY  03/13/2013   EXCISION OF BACK LESION N/A 12/18/2023   Procedure: EXCISION, LESION, BACK;  Surgeon: Rodolph Romano, MD;  Location: [facility];  Service: General;  Laterality: N/A;   HEMORRHOID BANDING     LYMPHADENECTOMY Bilateral 06/21/2013   Procedure: LYMPHADENECTOMY   BILATERAL PELVIC LYMPH NODE DISSECTION ;  Surgeon: Ricardo Likens, MD;  Location: [facility];  Service: Urology;   Laterality: Bilateral;   RIGHT AND LEFT HEART CATH N/A 09/19/2023   Procedure: RIGHT AND LEFT HEART CATH;  Surgeon: Gardenia Led, DO;  Location: [facility];  Service: Cardiovascular;  Laterality: N/A;   ROBOT ASSISTED LAPAROSCOPIC RADICAL PROSTATECTOMY N/A 06/21/2013   Procedure: ROBOTIC ASSISTED LAPAROSCOPIC RADICAL PROSTATECTOMY;  Surgeon: Ricardo Likens, MD;  Location: [facility];  Service: Urology;  Laterality: N/A;   TEE WITHOUT CARDIOVERSION N/A 09/21/2023   Procedure: TRANSESOPHAGEAL ECHOCARDIOGRAM (TEE);  Surgeon: Cherrie Toribio SAUNDERS, MD;  Location: [facility];  Service: Cardiovascular;  Laterality: N/A;  +DCCV   Family History  Problem Relation Age of Onset   Prostate cancer Father        with mets to nodes   Cancer Brother        colon and prostate   Heart attack Mother    Social History   Socioeconomic History   Marital status: Married    Spouse name: Not on file   Number of children: Not on file   Years of education: Not on file   Highest education level: Not on file  Occupational History   Occupation: retired  Tobacco Use   Smoking status: Former    Current packs/day: 0.00    Average packs/day: 1.5 packs/day for 10.0 years (15.0 ttl pk-yrs)    Types: Cigarettes    Start date: 06/06/1962    Quit date: 06/06/1972    Years since quitting: 51.7   Smokeless tobacco: Former  Building services engineer status: Former  Substance and Sexual Activity   Alcohol use: No   Drug use: No   Sexual activity: Not Currently  Other Topics Concern   Not on file  Social History Narrative   Not on file   Social Drivers of Health   Financial Resource Strain: Low Risk  (02/16/2024)   Overall Financial Resource Strain (CARDIA)    Difficulty of Paying Living Expenses: Not hard at all  Recent Concern: Financial Resource Strain - Medium Risk (12/12/2023)   Received from [facility]   Overall Financial Resource Strain (CARDIA)    Difficulty of Paying Living  Expenses: Somewhat hard  Food Insecurity: No Food Insecurity (02/16/2024)   Hunger Vital Sign    Worried About Running Out of Food in the Last Year: Never true    Ran Out of Food in the Last Year: Never true  Transportation Needs: No Transportation Needs (02/16/2024)   PRAPARE - Administrator, Civil Service (Medical): No    Lack of Transportation (Non-Medical): No  Physical Activity: Insufficiently Active (02/16/2024)   Exercise Vital Sign    Days of Exercise per Week: 7 days    Minutes of Exercise per Session: 20 min  Stress: No Stress Concern Present (02/16/2024)   [employer] of Occupational Health - Occupational Stress Questionnaire    Feeling of Stress: Not at all  Social Connections: Socially Integrated (02/16/2024)   Social Connection and Isolation Panel    Frequency of Communication  with Friends and Family: More than three times a week    Frequency of Social Gatherings with Friends and Family: Once a week    Attends Religious Services: More than 4 times per year    Active Member of [employer] or Organizations: Yes    Attends Engineer, structural: More than 4 times per year    Marital Status: Married    Tobacco Counseling Counseling given: Not Answered    Clinical Intake:  Pre-visit preparation completed: Yes  Pain : No/denies pain     BMI - recorded: 19.8 Nutritional Status: BMI of 19-24  Normal Nutritional Risks: None Diabetes: No  Lab Results  Component Value Date   HGBA1C 6.0 (H) 01/10/2022   HGBA1C 6.4 (H) 07/06/2021   HGBA1C 5.9 (A) 11/04/2020     How often do you need to have someone help you when you read instructions, pamphlets, or other written materials from your doctor or pharmacy?: 1 - Never  Interpreter Needed?: No  Information entered by :: JHONNIE DAS, LPN   Activities of Daily Living    02/16/2024   10:22 AM 09/21/2023    7:22 AM  In your present state of health, do you have any difficulty performing the following  activities:  Hearing? 0 0  Vision? 0 0  Difficulty concentrating or making decisions? 1 0  Comment REMEMBERING   Walking or climbing stairs? 1   Dressing or bathing? 0   Doing errands, shopping? 0   Preparing Food and eating ? N   Using the Toilet? N   In the past six months, have you accidently leaked urine? N   Do you have problems with loss of bowel control? N   Managing your Medications? Y   Managing your Finances? Y   Housekeeping or managing your Housekeeping? N     Patient Care Team: Edman Marsa PARAS, DO as PCP - General (Family Medicine) Sankar, Seeplaputhur G, MD (General Surgery) [facility], [facility]  I have updated your Care Teams any recent Medical Services you may have received from other providers in the past year.     Assessment:   This is a routine wellness examination for [place].  Hearing/Vision screen Hearing Screening - Comments:: NO AIDS Vision Screening - Comments:: READERS- [place]ARD   Goals Addressed             This Visit's Progress    DIET - REDUCE SUGAR INTAKE         Depression Screen     02/16/2024   10:18 AM 12/05/2023    2:55 PM 12/30/2022    9:29 AM 01/10/2022    9:17 AM 10/15/2021    3:29 PM 09/01/2020    9:19 AM 05/05/2020    9:23 AM  PHQ 2/9 Scores  PHQ - 2 Score 0 0 0 0 0 0 0  PHQ- 9 Score 0 0 0 1       Fall Risk     02/16/2024   10:21 AM 12/05/2023    2:55 PM 12/30/2022    9:32 AM 01/10/2022    9:17 AM 10/15/2021    3:32 PM  Fall Risk   Falls in the past year? 1 0 1 0 0  Number falls in past yr: 0  0 0 0  Comment SLID DOWN THE STAIRS      Injury with Fall? 0  1 0 0  Risk for fall due to : History of fall(s)  History of fall(s) No Fall  Risks No Fall Risks  Follow up Falls evaluation completed;Falls prevention discussed  Falls evaluation completed;Falls prevention discussed Falls evaluation completed  Falls evaluation completed      Data saved with a previous flowsheet row definition    MEDICARE RISK AT HOME:   Medicare Risk at Home Any stairs in or around the home?: Yes If so, are there any without handrails?: No Home free of loose throw rugs in walkways, pet beds, electrical cords, etc?: Yes Adequate lighting in your home to reduce risk of falls?: Yes Life alert?: No Use of a cane, walker or w/c?: No Grab bars in the bathroom?: No Shower chair or bench in shower?: No Elevated toilet seat or a handicapped toilet?: No  TIMED UP AND GO:  Was the test performed?  No  Cognitive Function: 6CIT completed        02/16/2024   10:24 AM 12/30/2022    9:36 AM 09/01/2020    9:23 AM  6CIT Screen  What Year? 0 points 0 points 0 points  What month? 0 points 0 points 0 points  What time? 0 points 3 points 0 points  Count back from 20 0 points 0 points 2 points  Months in reverse 0 points 0 points 0 points  Repeat phrase 2 points 4 points 6 points  Total Score 2 points 7 points 8 points    Immunizations Immunization History  Administered Date(s) Administered   Fluad Quad(high Dose 65+) 05/05/2020, 06/22/2021   Influenza, High Dose Seasonal PF 04/20/2017, 03/28/2019   Influenza-Unspecified 03/30/2019   PFIZER(Purple Top)SARS-COV-2 Vaccination 08/23/2019, 09/13/2019, 07/14/2020   Pneumococcal Conjugate-13 01/15/2019    Screening Tests Health Maintenance  Topic Date Due   DTaP/Tdap/Td (1 - Tdap) Never done   Zoster Vaccines- Shingrix (1 of 2) Never done   Pneumococcal Vaccine: 50+ Years (2 of 2 - PPSV23, PCV20, or PCV21) 03/12/2019   COVID-19 Vaccine (4 - 2024-25 season) 04/09/2023   INFLUENZA VACCINE  03/08/2024   Medicare Annual Wellness (AWV)  02/15/2025   Hepatitis B Vaccines  Aged Out   HPV VACCINES  Aged Out   Meningococcal B Vaccine  Aged Out    Health Maintenance  Health Maintenance Due  Topic Date Due   DTaP/Tdap/Td (1 - Tdap) Never done   Zoster Vaccines- Shingrix (1 of 2) Never done   Pneumococcal Vaccine: 50+ Years (2 of 2 - PPSV23, PCV20, or PCV21) 03/12/2019    COVID-19 Vaccine (4 - 2024-25 season) 04/09/2023   Health Maintenance Items Addressed: UP TO DATE ON COLONOSCOPY; NEEDS TDAP, PNA, SHINGRIX, COVID  Additional Screening:  Vision Screening: Recommended annual ophthalmology exams for early detection of glaucoma and other disorders of the eye. Would you like a referral to an eye doctor? No    Dental Screening: Recommended annual dental exams for proper oral hygiene  Community Resource Referral / Chronic Care Management: CRR required this visit?  No   CCM required this visit?  No   Plan:    I have personally reviewed and noted the following in the patient's chart:   Medical and social history Use of alcohol, tobacco or illicit drugs  Current medications and supplements including opioid prescriptions. Patient is not currently taking opioid prescriptions. Functional ability and status Nutritional status Physical activity Advanced directives List of other physicians Hospitalizations, surgeries, and ER visits in previous 12 months Vitals Screenings to include cognitive, depression, and falls Referrals and appointments  In addition, I have reviewed and discussed with patient certain preventive protocols, quality  metrics, and best practice recommendations. A written personalized care plan for preventive services as well as general preventive health recommendations were provided to patient.   Jhonnie GORMAN Das, LPN   2/88/7974   After Visit Summary: (MyChart) Due to this being a telephonic visit, the after visit summary with patients personalized plan was offered to patient via MyChart   Notes: Nothing significant to report at this time.

## 2024-03-21 ENCOUNTER — Telehealth (HOSPITAL_COMMUNITY): Payer: Self-pay

## 2024-03-21 NOTE — Telephone Encounter (Signed)
-----   Message from Olam ORN sent at 03/15/2024  8:48 AM EDT ----- PENDING TO SCHEDULE ----- Message ----- From: Janet Olam NOVAK Sent: 03/13/2024  12:39 PM EDT To: Fausto JONELLE Ross, CMA  Pending to schedule ----- Message ----- From: Janet Olam NOVAK Sent: 02/16/2024  11:44 AM EDT To: Powell CHRISTELLA Latino, RN; Jannat Rosemeyer R Idella Lamontagne, CMA  We are now doing PYP again.SABRA we will need PA# in order to schedule after 02/29/24. ----- Message ----- From: Janet Olam NOVAK Sent: 09/29/2023  12:34 PM EDT To: Fausto JONELLE Ross, CMA  Patient needs PRIOR AUTH FOR AMYLOID please.

## 2024-03-27 ENCOUNTER — Other Ambulatory Visit: Payer: Self-pay | Admitting: Family Medicine

## 2024-03-27 DIAGNOSIS — I5020 Unspecified systolic (congestive) heart failure: Secondary | ICD-10-CM

## 2024-03-28 NOTE — Telephone Encounter (Signed)
 Requested Prescriptions  Pending Prescriptions Disp Refills   dapagliflozin  propanediol (FARXIGA ) 10 MG TABS tablet [Pharmacy Med Name: FARXIGA  10 MG TABLET] 90 tablet 0    Sig: TAKE 1 TABLET BY MOUTH EVERY DAY     Endocrinology:  Diabetes - SGLT2 Inhibitors Failed - 03/28/2024  4:20 PM      Failed - HBA1C is between 0 and 7.9 and within 180 days    Hgb A1c MFr Bld  Date Value Ref Range Status  01/10/2022 6.0 (H) <5.7 % of total Hgb Final    Comment:    For someone without known diabetes, a hemoglobin  A1c value between 5.7% and 6.4% is consistent with prediabetes and should be confirmed with a  follow-up test. . For someone with known diabetes, a value <7% indicates that their diabetes is well controlled. A1c targets should be individualized based on duration of diabetes, age, comorbid conditions, and other considerations. . This assay result is consistent with an increased risk of diabetes. . Currently, no consensus exists regarding use of hemoglobin A1c for diagnosis of diabetes for children. .          Passed - Cr in normal range and within 360 days    Creat  Date Value Ref Range Status  07/06/2021 0.69 (L) 0.70 - 1.28 mg/dL Final   Creatinine, Ser  Date Value Ref Range Status  09/22/2023 0.84 0.61 - 1.24 mg/dL Final         Passed - eGFR in normal range and within 360 days    GFR, Est African American  Date Value Ref Range Status  04/27/2020 99 > OR = 60 mL/min/1.84m2 Final   GFR calc Af Amer  Date Value Ref Range Status  05/18/2021 119.5  Final   GFR, Est Non African American  Date Value Ref Range Status  04/27/2020 85 > OR = 60 mL/min/1.54m2 Final   GFR, Estimated  Date Value Ref Range Status  09/22/2023 >60 >60 mL/min Final    Comment:    (NOTE) Calculated using the CKD-EPI Creatinine Equation (2021)    eGFR  Date Value Ref Range Status  07/06/2021 94 > OR = 60 mL/min/1.31m2 Final    Comment:    The eGFR is based on the CKD-EPI 2021 equation.  To calculate  the new eGFR from a previous Creatinine or Cystatin C result, go to https://www.kidney.org/professionals/ kdoqi/gfr%5Fcalculator          Passed - Valid encounter within last 6 months    Recent Outpatient Visits           2 months ago Heart failure with reduced ejection fraction [facility])   [place] [facility] [place], Marsa PARAS, DO   3 months ago Infected epidermoid cyst   [place] [facility] [place] Marsa PARAS, DO   5 months ago Acute deep vein thrombosis (DVT) of proximal vein of right lower extremity [facility])   [facility] [place], Marsa PARAS, [place]

## 2024-04-01 ENCOUNTER — Telehealth (HOSPITAL_COMMUNITY): Payer: Self-pay | Admitting: *Deleted

## 2024-04-01 NOTE — Telephone Encounter (Signed)
 Attempted to call patient regarding upcoming appointment for Amyloid Scan, no answer, unable to leave a message.  Claudene Ronal Quale, RN

## 2024-04-02 ENCOUNTER — Other Ambulatory Visit: Payer: Self-pay | Admitting: Cardiology

## 2024-04-02 DIAGNOSIS — E854 Organ-limited amyloidosis: Secondary | ICD-10-CM

## 2024-04-03 ENCOUNTER — Telehealth: Payer: Self-pay | Admitting: Family Medicine

## 2024-04-03 ENCOUNTER — Ambulatory Visit (HOSPITAL_COMMUNITY)
Admission: RE | Admit: 2024-04-03 | Discharge: 2024-04-03 | Disposition: A | Discharge status: Home / Self Care | Source: Ambulatory Visit | Attending: Cardiology | Admitting: Cardiology

## 2024-04-03 DIAGNOSIS — I43 Cardiomyopathy in diseases classified elsewhere: Secondary | ICD-10-CM

## 2024-04-03 DIAGNOSIS — E854 Organ-limited amyloidosis: Secondary | ICD-10-CM

## 2024-04-03 LAB — MYOCARDIAL AMYLOID PLANAR & SPECT: H/CL Ratio: 1.58

## 2024-04-03 MED ORDER — TECHNETIUM TC 99M PYROPHOSPHATE
20.7000 | Freq: Once | INTRAVENOUS | Status: AC
  Administered 2024-04-03: 20.7 via INTRAVENOUS

## 2024-04-03 NOTE — Telephone Encounter (Signed)
 Patient notified this was called in on 03/28/24, patient will check with pharmacy

## 2024-04-03 NOTE — Telephone Encounter (Signed)
 Prescription Request  04/03/2024  LOV: 01/02/2024  What is the name of the medication or equipment? FARXIGA   10mg   pt said that the drug store did not have a refill on this medication look like the medication was called in on 8-21 can someone [facility]  [place] to  follow up  Have you contacted your pharmacy to request a refill? Yes   Which pharmacy would you like this sent to?  [id number] [place], [place] - [address] [place] [address] Phone: [phone] Fax: [phone]    Patient notified that their request is being sent to the clinical staff for review and that they should receive a response within 2 business days.   Please advise at Mobile [phone] (mobile)

## 2024-04-15 ENCOUNTER — Ambulatory Visit (HOSPITAL_COMMUNITY): Payer: Self-pay | Admitting: Cardiology

## 2024-04-25 ENCOUNTER — Other Ambulatory Visit: Payer: Self-pay | Admitting: Cardiology

## 2024-04-25 DIAGNOSIS — I4891 Unspecified atrial fibrillation: Secondary | ICD-10-CM

## 2024-05-07 ENCOUNTER — Telehealth: Payer: Self-pay | Admitting: Cardiology

## 2024-05-07 NOTE — Telephone Encounter (Signed)
 Called to confirm/remind patient of their appointment at the [facility] on 05/07/24.   Appointment:   [] Confirmed  [x] Left mess   [] No answer/No voice mail  [] VM Full/unable to leave message  [] Phone not in service  Patient reminded to bring all medications and/or complete list.  Confirmed patient has transportation. Gave directions, instructed to utilize valet parking.

## 2024-05-08 ENCOUNTER — Ambulatory Visit: Attending: Cardiology | Admitting: Cardiology

## 2024-05-08 VITALS — BP 117/75 | HR 75 | Wt 136.0 lb

## 2024-05-08 DIAGNOSIS — I5022 Chronic systolic (congestive) heart failure: Secondary | ICD-10-CM | POA: Insufficient documentation

## 2024-05-08 DIAGNOSIS — I472 Ventricular tachycardia, unspecified: Secondary | ICD-10-CM | POA: Diagnosis not present

## 2024-05-08 DIAGNOSIS — I110 Hypertensive heart disease with heart failure: Secondary | ICD-10-CM | POA: Diagnosis not present

## 2024-05-08 DIAGNOSIS — I080 Rheumatic disorders of both mitral and aortic valves: Secondary | ICD-10-CM | POA: Insufficient documentation

## 2024-05-08 DIAGNOSIS — Z7982 Long term (current) use of aspirin: Secondary | ICD-10-CM | POA: Diagnosis not present

## 2024-05-08 DIAGNOSIS — I5020 Unspecified systolic (congestive) heart failure: Secondary | ICD-10-CM

## 2024-05-08 DIAGNOSIS — M1990 Unspecified osteoarthritis, unspecified site: Secondary | ICD-10-CM | POA: Insufficient documentation

## 2024-05-08 DIAGNOSIS — I272 Pulmonary hypertension, unspecified: Secondary | ICD-10-CM | POA: Diagnosis not present

## 2024-05-08 DIAGNOSIS — Z79899 Other long term (current) drug therapy: Secondary | ICD-10-CM | POA: Insufficient documentation

## 2024-05-08 DIAGNOSIS — Z7901 Long term (current) use of anticoagulants: Secondary | ICD-10-CM | POA: Diagnosis not present

## 2024-05-08 DIAGNOSIS — I459 Conduction disorder, unspecified: Secondary | ICD-10-CM | POA: Diagnosis not present

## 2024-05-08 DIAGNOSIS — I493 Ventricular premature depolarization: Secondary | ICD-10-CM | POA: Diagnosis not present

## 2024-05-08 DIAGNOSIS — I2510 Atherosclerotic heart disease of native coronary artery without angina pectoris: Secondary | ICD-10-CM | POA: Diagnosis not present

## 2024-05-08 DIAGNOSIS — I4892 Unspecified atrial flutter: Secondary | ICD-10-CM | POA: Insufficient documentation

## 2024-05-08 NOTE — Patient Instructions (Signed)
 Medication Changes:  No medication changes today!    Testing/Procedures:  Your physician has requested that you have an echocardiogram. Echocardiography is a painless test that uses sound waves to create images of your heart. It provides your doctor with information about the size and shape of your heart and how well your heart's chambers and valves are working. This procedure takes approximately one hour. There are no restrictions for this procedure. Please do NOT wear cologne, perfume, aftershave, or lotions (deodorant is allowed). Please arrive 15 minutes prior to your appointment time.  Please note: We ask at that you not bring children with you during ultrasound (echo/ vascular) testing. Due to room size and safety concerns, children are not allowed in the ultrasound rooms during exams. Our front office staff cannot provide observation of children in our lobby area while testing is being conducted. An adult accompanying a patient to their appointment will only be allowed in the ultrasound room at the discretion of the ultrasound technician under special circumstances. We apologize for any inconvenience.  Someone will be in contact with you in order to schedule your appointment.   Follow-Up in: Please follow up with the [facility] in 6 months with Dr. Zenaida. We do not currently have that schedule. Please give us  a call in March 2026 in order to schedule your appointment for April.    Thank you for choosing [facility].    At the [facility], you and your health needs are our priority. We have a designated team specialized in the treatment of Heart Failure. This Care Team includes your primary Heart Failure Specialized Cardiologist (physician), Advanced Practice Providers (APPs- Physician Assistants and Nurse Practitioners), and Pharmacist who all work together to provide you with the care you need, when you need it.    You may see any of the following providers on your designated Care Team at your next follow up:  Dr. Toribio Fuel Dr. Ezra Shuck Dr. Ria Commander Dr. Morene Zenaida Ellouise Class, FNP Jaun Bash, RPH-CPP  Please be sure to bring in all your medications bottles to every appointment.   Need to Contact Us :  If you have any questions or concerns before your next appointment please send us  a message through [place] or call our office at [phone].    TO LEAVE A MESSAGE FOR THE NURSE SELECT OPTION 2, PLEASE LEAVE A MESSAGE INCLUDING: YOUR NAME DATE OF BIRTH CALL BACK NUMBER REASON FOR CALL**this is important as we prioritize the call backs  YOU WILL RECEIVE A CALL BACK THE SAME DAY AS LONG AS YOU CALL BEFORE 4:00 PM

## 2024-05-11 NOTE — Progress Notes (Signed)
 ADVANCED HEART FAILURE FOLLOW UP CLINIC NOTE  Referring Physician: Edman Blunt *  Primary Care: Edman Blunt PARAS, DO Primary Cardiologist:  HPI: Keith Bates is a 83 y.o. male with a PMH of arthritis, prostate CA on hormonal therapy, recent diagnosis of  who presents for follow up of chronic systolic heart failure.      Patient was admitted on 09/15/23 with several months of progressive HF symptoms    In ER he was noted to be hypertensive with a blood pressure of 167/87 with atrial flutter.   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   Patient underwent left heart catheterization that showed severe, multivessel CAD.  In addition, right heart catheterization with normal filling pressures and severely reduced cardiac index.  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     SUBJECTIVE:  Patient continues to do well.  He walks his driveway usually around 10 times daily and denies any significant change in shortness of breath, lower extremity edema, orthopnea.  He denies any active chest pain.  We discussed his recent cardiac amyloid PYP scan that showed no evidence of significant disease.  Suspect MRI read is positive in the setting of coronary disease.  PMH, current medications, allergies, social history, and family history reviewed in epic.  PHYSICAL EXAM: Vitals:   05/08/24 1101  BP: 117/75  Pulse: 75  SpO2: 91%   GENERAL: Well-appearing PULM:  Normal work of breathing, clear to auscultation bilaterally. Respirations are unlabored.   CARDIAC:  JVP: Flat        Normal rate, irregular rhythm, no murmurs, no lower extremity edema ABDOMEN: Soft, non-tender, non-distended. NEUROLOGIC: Patient is oriented x3 with no focal or lateralizing neurologic deficits.   Data Review:   Cath: Left main with mild disease, 80% lesion proximal LAD, moderate-severe left circumflex/OM disease, severe RCA disease.  CMR: cMRI LVEF 33%, diffuse subendocardial LGE, mod dilated LA/RA, mod dilated RV, normal RV function. 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 + LAA clot - TTR genetics negative - Echocardiogram at next visit  2. CAD:  - LHC 2/12 with 3V CAD. Not CABG candidate. No targets for PCI -> medical therapy - Continue ASA/statin -No significant anginal symptoms - Consider dropping aspirin  at 1 year  3. Atrial flutter with slow VR:  - Continue eliquis  5mg  BID - TEE with LAA clot so no DC-CV. Doubt would hold NSR with degree of LAE - Amio stopped in setting of conduction disease - Remains in atrial flutter with controlled rate   4. Frequent PVCs/NSVT -  Continue mexilitine 200mg  BID   5. Aortic stenosis/mitral regurgitation: - AS is mild to moderate LF/LG AS - Repeat echocardiogram as above  Follow up in 3 months  Morene Brownie, MD Advanced Heart Failure Mechanical Circulatory Support 05/11/24

## 2024-05-22 ENCOUNTER — Other Ambulatory Visit: Payer: Self-pay | Admitting: Cardiology

## 2024-05-22 DIAGNOSIS — I5020 Unspecified systolic (congestive) heart failure: Secondary | ICD-10-CM

## 2024-06-12 ENCOUNTER — Other Ambulatory Visit

## 2024-06-12 ENCOUNTER — Ambulatory Visit (INDEPENDENT_AMBULATORY_CARE_PROVIDER_SITE_OTHER)

## 2024-06-12 DIAGNOSIS — Z8546 Personal history of malignant neoplasm of prostate: Secondary | ICD-10-CM

## 2024-06-12 DIAGNOSIS — E7800 Pure hypercholesterolemia, unspecified: Secondary | ICD-10-CM

## 2024-06-12 DIAGNOSIS — Z23 Encounter for immunization: Secondary | ICD-10-CM

## 2024-06-12 DIAGNOSIS — R7303 Prediabetes: Secondary | ICD-10-CM

## 2024-06-12 DIAGNOSIS — Z0000 Encounter for general adult medical examination without abnormal findings: Secondary | ICD-10-CM

## 2024-06-12 DIAGNOSIS — I5020 Unspecified systolic (congestive) heart failure: Secondary | ICD-10-CM

## 2024-06-13 LAB — CBC WITH DIFFERENTIAL/PLATELET
Absolute Lymphocytes: 1991 {cells}/uL (ref 850–3900)
Absolute Monocytes: 666 {cells}/uL (ref 200–950)
Basophils Absolute: 37 {cells}/uL (ref 0–200)
Basophils Relative: 0.5 %
Eosinophils Absolute: 141 {cells}/uL (ref 15–500)
Eosinophils Relative: 1.9 %
HCT: 45.7 % (ref 38.5–50.0)
Hemoglobin: 15.1 g/dL (ref 13.2–17.1)
MCH: 30.4 pg (ref 27.0–33.0)
MCHC: 33 g/dL (ref 32.0–36.0)
MCV: 92 fL (ref 80.0–100.0)
MPV: 10.3 fL (ref 7.5–12.5)
Monocytes Relative: 9 %
Neutro Abs: 4566 {cells}/uL (ref 1500–7800)
Neutrophils Relative %: 61.7 %
Platelets: 203 Thousand/uL (ref 140–400)
RBC: 4.97 Million/uL (ref 4.20–5.80)
RDW: 14.6 % (ref 11.0–15.0)
Total Lymphocyte: 26.9 %
WBC: 7.4 Thousand/uL (ref 3.8–10.8)

## 2024-06-13 LAB — COMPREHENSIVE METABOLIC PANEL WITH GFR
AG Ratio: 1.6 (calc) (ref 1.0–2.5)
ALT: 23 U/L (ref 9–46)
AST: 20 U/L (ref 10–35)
Albumin: 4.1 g/dL (ref 3.6–5.1)
Alkaline phosphatase (APISO): 105 U/L (ref 35–144)
BUN: 16 mg/dL (ref 7–25)
CO2: 26 mmol/L (ref 20–32)
Calcium: 9.7 mg/dL (ref 8.6–10.3)
Chloride: 104 mmol/L (ref 98–110)
Creat: 0.97 mg/dL (ref 0.70–1.22)
Globulin: 2.5 g/dL (ref 1.9–3.7)
Glucose, Bld: 92 mg/dL (ref 65–99)
Potassium: 4.4 mmol/L (ref 3.5–5.3)
Sodium: 138 mmol/L (ref 135–146)
Total Bilirubin: 1.3 mg/dL — ABNORMAL HIGH (ref 0.2–1.2)
Total Protein: 6.6 g/dL (ref 6.1–8.1)
eGFR: 78 mL/min/1.73m2 (ref >=60)

## 2024-06-13 LAB — LIPID PANEL
Cholesterol: 110 mg/dL (ref <200)
HDL: 52 mg/dL (ref >=40)
LDL Cholesterol (Calc): 46 mg/dL
Non-HDL Cholesterol (Calc): 58 mg/dL (ref <130)
Total CHOL/HDL Ratio: 2.1 (calc) (ref <5.0)
Triglycerides: 42 mg/dL (ref <150)

## 2024-06-13 LAB — PSA: PSA: <0.04 ng/mL (ref <=4.00)

## 2024-06-13 LAB — HEMOGLOBIN A1C
Hgb A1c MFr Bld: 6.6 % — ABNORMAL HIGH (ref <5.7)
Mean Plasma Glucose: 143 mg/dL
eAG (mmol/L): 7.9 mmol/L

## 2024-06-19 ENCOUNTER — Ambulatory Visit (INDEPENDENT_AMBULATORY_CARE_PROVIDER_SITE_OTHER): Admitting: Family Medicine

## 2024-06-19 ENCOUNTER — Encounter: Payer: Self-pay | Admitting: Family Medicine

## 2024-06-19 VITALS — BP 134/78 | HR 60 | Ht 71.0 in | Wt 137.5 lb

## 2024-06-19 DIAGNOSIS — E7800 Pure hypercholesterolemia, unspecified: Secondary | ICD-10-CM | POA: Diagnosis not present

## 2024-06-19 DIAGNOSIS — Z23 Encounter for immunization: Secondary | ICD-10-CM

## 2024-06-19 DIAGNOSIS — N62 Hypertrophy of breast: Secondary | ICD-10-CM

## 2024-06-19 DIAGNOSIS — J3089 Other allergic rhinitis: Secondary | ICD-10-CM

## 2024-06-19 DIAGNOSIS — Z0000 Encounter for general adult medical examination without abnormal findings: Secondary | ICD-10-CM

## 2024-06-19 DIAGNOSIS — R7303 Prediabetes: Secondary | ICD-10-CM | POA: Diagnosis not present

## 2024-06-19 DIAGNOSIS — M150 Primary generalized (osteo)arthritis: Secondary | ICD-10-CM

## 2024-06-19 DIAGNOSIS — M06031 Rheumatoid arthritis without rheumatoid factor, right wrist: Secondary | ICD-10-CM

## 2024-06-19 DIAGNOSIS — I5020 Unspecified systolic (congestive) heart failure: Secondary | ICD-10-CM

## 2024-06-19 NOTE — Patient Instructions (Addendum)
 Thank you for coming to the office today.  Prevnar-20 pneumonia vaccine today  Recommend RSV Vaccine at the Pharmacy  Recent Labs    06/12/24 0848  HGBA1C 6.6*   We can consider future mammogram ultrasound of that area of extra breast tissue Right side.  Keep taking fluid heart pills they are working well.  Please schedule a Follow-up Appointment to: Return for 6 month PreDM A1c.  If you have any other questions or concerns, please feel free to call the office or send a message through MyChart. You may also schedule an earlier appointment if necessary.  Additionally, you may be receiving a survey about your experience at our office within a few days to 1 week by e-mail or mail. We value your feedback.  Marsa Officer, DO [facility], [place]

## 2024-06-19 NOTE — Progress Notes (Signed)
 Subjective:    Patient ID: Keith Bates, male    DOB: 11/07/1940, 83 y.o.   MRN: [id number]  Keith Bates is a 83 y.o. male presenting on 06/19/2024 for Annual Exam   HPI  Discussed the use of AI scribe software for clinical note transcription with the patient, who gave verbal consent to proceed.  History of Present Illness   Keith Bates is an 83 year old male who presents for an annual physical exam.    Lipid management - On atorvastatin  80 mg daily for hyperlipidemia - LDL cholesterol is well-controlled at 46 mg/dL   Cardiac and fluid management - On Farxiga , spironolactone , and Entresto  for heart and fluid management - Experiencing increased urination, attributed to these medications  Physical activity and lifestyle - Maintains an active lifestyle with daily indoor exercise and outdoor walks of 20-25 minutes - Avoids large crowds and cold environments to prevent respiratory issues     Pre-Diabetes: Reports concern with poor diet lately, he will cut back on sodas and sweets Now A1c up to 6.6, prior 5.8 to 6.4 CBGs: Not checking Meds: Never on meds Not on ACEi/ARB Lifestyle: - Diet (Improved diet, veggies, reduced fried foods, reduced sweets still, reduced ice cream) - Exercise (improve regular exercise outdoor mowing and active) Denies hypoglycemia    Osteoarthritis Multiple joints / history of RA vs OA Right Wrist vs Possible Carpal Tunnel See prior notes for background information. - Today doing well. Still taking Meloxicam  7.5mg  PRN, not taking regularly - Also takes Tylenol , uses copper compression gloves - Pain can be increased repetitive activity and yardwork - Other joints with pain aching and stiffness at times, if more active, including knees Admits R hip popping pain.  L Shoulder bursitis   HYPERLIPIDEMIA: - Reports no concerns. 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 Urology follows PSA - PSA < 0.04 negative undetectable     Health Maintenance:  Flu Shot done 1 week ago Prevnar 20 pneumonia vaccine today Discussed RSV at pharmacy vaccine Decline Shingles     06/19/2024    9:06 AM 06/19/2024    9:05 AM 02/16/2024   10:18 AM  Depression screen PHQ 2/9  Decreased Interest   0  Down, Depressed, Hopeless 0 0 0  PHQ - 2 Score 0 0 0  Altered sleeping 0  0  Tired, decreased energy 0  0  Change in appetite 0  0  Feeling bad or failure about yourself  0  0  Trouble concentrating 1  0  Moving slowly or fidgety/restless 0  0  Suicidal thoughts 0  0  PHQ-9 Score 1  0   Difficult doing work/chores Not difficult at all  Not difficult at all     Data saved with a previous flowsheet row definition       06/19/2024    9:06 AM 12/05/2023    2:56 PM 01/10/2022    9:17 AM  GAD 7 : Generalized Anxiety Score  Nervous, Anxious, on Edge 1 1 0  Control/stop worrying 0 0 0  Worry too much - different things 0 0 0  Trouble relaxing 0 0 0  Restless 0 0 0  Easily annoyed or  irritable 1 2 1   Afraid - awful might happen 0 0 0  Total GAD 7 Score 2 3 1   Anxiety Difficulty Not difficult at all Somewhat difficult Not difficult at all     Past Medical History:  Diagnosis Date   Arthritis    DDD (degenerative disc disease)    Enlarged prostate    Environmental allergies    GERD (gastroesophageal reflux disease)    Hx of ulcer disease    Loss of weight    Nocturia    Prostate cancer (HCC)    Unspecified hemorrhoids without mention of complication    Past Surgical History:  Procedure Laterality Date   CARDIOVERSION N/A 09/21/2023   Procedure: CARDIOVERSION;  Surgeon: Cherrie Toribio SAUNDERS, MD;  Location: [facility] ORS;  Service: Cardiovascular;   Laterality: N/A;   COLONOSCOPY  03/13/2013   EXCISION OF BACK LESION N/A 12/18/2023   Procedure: EXCISION, LESION, BACK;  Surgeon: Rodolph Romano, MD;  Location: [facility] ORS;  Service: General;  Laterality: N/A;   HEMORRHOID BANDING     LYMPHADENECTOMY Bilateral 06/21/2013   Procedure: LYMPHADENECTOMY   BILATERAL PELVIC LYMPH NODE DISSECTION ;  Surgeon: Ricardo Likens, MD;  Location: [facility] OR[facility];  Service: Urology;  Laterality: Bilateral;   RIGHT AND LEFT HEART CATH N/A 09/19/2023   Procedure: RIGHT AND LEFT HEART CATH;  Surgeon: Gardenia Led, DO;  Location: [facility] INVASIVE CV LAB;  Service: Cardiovascular;  Laterality: N/A;   ROBOT ASSISTED LAPAROSCOPIC RADICAL PROSTATECTOMY N/A 06/21/2013   Procedure: ROBOTIC ASSISTED LAPAROSCOPIC RADICAL PROSTATECTOMY;  Surgeon: Ricardo Likens, MD;  Location: [facility];  Service: Urology;  Laterality: N/A;   TEE WITHOUT CARDIOVERSION N/A 09/21/2023   Procedure: TRANSESOPHAGEAL ECHOCARDIOGRAM (TEE);  Surgeon: Cherrie Toribio SAUNDERS, MD;  Location: [facility] ORS;  Service: Cardiovascular;  Laterality: N/A;  +DCCV   Social History   Socioeconomic History   Marital status: Married    Spouse name: Not on file   Number of children: Not on file   Years of education: Not on file   Highest education level: Not on file  Occupational History   Occupation: retired  Tobacco Use   Smoking status: Former    Current packs/day: 0.00    Average packs/day: 1.5 packs/day for 10.0 years (15.0 ttl pk-yrs)    Types: Cigarettes    Start date: 06/06/1962    Quit date: 06/06/1972    Years since quitting: 52.0   Smokeless tobacco: Former  Building Services Engineer status: Former  Substance and Sexual Activity   Alcohol use: No   Drug use: No   Sexual activity: Not Currently  Other Topics Concern   Not on file  Social History Narrative   Not on file   Social Drivers of Health   Financial Resource Strain: Low Risk  (02/16/2024)   Overall Financial Resource Strain (CARDIA)     Difficulty of Paying Living Expenses: Not hard at all  Recent Concern: Financial Resource Strain - Medium Risk (12/12/2023)   Received [facility]   Overall Financial Resource Strain (CARDIA)    Difficulty of Paying Living Expenses: Somewhat hard  Food Insecurity: No Food Insecurity (02/16/2024)   Hunger Vital Sign    Worried About Running Out of Food in the Last Year: Never true    Ran Out of Food in the Last Year: Never true  Transportation Needs: No Transportation Needs (02/16/2024)   PRAPARE - Administrator, Civil Service (Medical): No    Lack of Transportation (Non-Medical):  No  Physical Activity: Insufficiently Active (02/16/2024)   Exercise Vital Sign    Days of Exercise per Week: 7 days    Minutes of Exercise per Session: 20 min  Stress: No Stress Concern Present (02/16/2024)   [employer] of Occupational Health - Occupational Stress Questionnaire    Feeling of Stress: Not at all  Social Connections: Socially Integrated (02/16/2024)   Social Connection and Isolation Panel    Frequency of Communication with Friends and Family: More than three times a week    Frequency of Social Gatherings with Friends and Family: Once a week    Attends Religious Services: More than 4 times per year    Active Member of [employer] or Organizations: Yes    Attends Engineer, Structural: More than 4 times per year    Marital Status: Married  Catering Manager Violence: Not At Risk (02/16/2024)   Humiliation, Afraid, Rape, and Kick questionnaire    Fear of Current or Ex-Partner: No    Emotionally Abused: No    Physically Abused: No    Sexually Abused: No   Family History  Problem Relation Age of Onset   Prostate cancer Father        with mets to nodes   Cancer Brother        colon and prostate   Heart attack Mother    Current Outpatient Medications on File Prior to Visit  Medication Sig   apixaban  (ELIQUIS ) 5 MG TABS tablet Take 1 tablet (5 mg total)  by mouth 2 (two) times daily.   aspirin  EC 81 MG tablet Take 1 tablet (81 mg total) by mouth daily. Swallow whole.   atorvastatin  (LIPITOR ) 80 MG tablet Take 1 tablet (80 mg total) by mouth at bedtime.   dapagliflozin  propanediol (FARXIGA ) 10 MG TABS tablet TAKE 1 TABLET BY MOUTH EVERY DAY   fluticasone  (FLONASE ) 50 MCG/ACT nasal spray Place 1 spray into both nostrils 2 (two) times daily.   mexiletine (MEXITIL ) 200 MG capsule TAKE 1 CAPSULE BY MOUTH TWICE A DAY   sacubitril -valsartan  (ENTRESTO ) 24-26 MG TAKE 1 TABLET BY MOUTH TWICE A DAY   spironolactone  (ALDACTONE ) 25 MG tablet Take 1 tablet (25 mg total) by mouth daily.   No current facility-administered medications on file prior to visit.    Review of Systems  Constitutional:  Negative for activity change, appetite change, chills, diaphoresis, fatigue and fever.  HENT:  Negative for congestion and hearing loss.   Eyes:  Negative for visual disturbance.  Respiratory:  Negative for cough, chest tightness, shortness of breath and wheezing.   Cardiovascular:  Negative for chest pain, palpitations and leg swelling.  Gastrointestinal:  Negative for abdominal pain, constipation, diarrhea, nausea and vomiting.  Genitourinary:  Negative for dysuria, frequency and hematuria.  Musculoskeletal:  Positive for arthralgias. Negative for neck pain.  Skin:  Negative for rash.  Neurological:  Negative for dizziness, weakness, light-headedness, numbness and headaches.  Hematological:  Negative for adenopathy.  Psychiatric/Behavioral:  Negative for behavioral problems, dysphoric mood and sleep disturbance.    Per HPI unless specifically indicated above     Objective:    BP 134/78 (BP Location: Left Arm, Patient Position: Sitting, Cuff Size: Normal)   Pulse 60   Ht 5' 11 (1.803 m)   Wt 137 lb 8 oz (62.4 kg)   BMI 19.18 kg/m   Wt Readings from Last 3 Encounters:  06/19/24 137 lb 8 oz (62.4 kg)  05/08/24 136 lb (61.7 kg)  01/15/24 142 lb  4 oz  (64.5 kg)    Physical Exam Vitals and nursing note reviewed.  Constitutional:      General: He is not in acute distress.    Appearance: He is well-developed. He is not diaphoretic.     Comments: Well-appearing, comfortable, cooperative  HENT:     Head: Normocephalic and atraumatic.  Eyes:     General:        Right eye: No discharge.        Left eye: No discharge.     Conjunctiva/sclera: Conjunctivae normal.     Pupils: Pupils are equal, round, and reactive to light.  Neck:     Thyroid: No thyromegaly.  Cardiovascular:     Rate and Rhythm: Normal rate and regular rhythm.     Pulses: Normal pulses.     Heart sounds: Normal heart sounds. No murmur heard. Pulmonary:     Effort: Pulmonary effort is normal. No respiratory distress.     Breath sounds: Normal breath sounds. No wheezing or rales.  Chest:    Abdominal:     General: Bowel sounds are normal. There is no distension.     Palpations: Abdomen is soft. There is no mass.     Tenderness: There is no abdominal tenderness.  Musculoskeletal:        General: No tenderness. Normal range of motion.     Cervical back: Normal range of motion and neck supple.     Comments: Upper / Lower Extremities: - Normal muscle tone, strength bilateral upper extremities 5/5, lower extremities 5/5  Lymphadenopathy:     Cervical: No cervical adenopathy.  Skin:    General: Skin is warm and dry.     Findings: No erythema or rash.  Neurological:     Mental Status: He is alert and oriented to person, place, and time.     Comments: Distal sensation intact to light touch all extremities  Psychiatric:        Mood and Affect: Mood normal.        Behavior: Behavior normal.        Thought Content: Thought content normal.     Comments: Well groomed, good eye contact, normal speech and thoughts     Results for orders placed or performed in visit on 06/12/24  Comprehensive metabolic panel with GFR   Collection Time: 06/12/24  8:48 AM  Result Value  Ref Range   Glucose, Bld 92 65 - 99 mg/dL   BUN 16 7 - 25 mg/dL   Creat 9.02 9.29 - 8.77 mg/dL   eGFR 78 > OR = 60 fO/fpw/8.26f7   BUN/Creatinine Ratio SEE NOTE: 6 - 22 (calc)   Sodium 138 135 - 146 mmol/L   Potassium 4.4 3.5 - 5.3 mmol/L   Chloride 104 98 - 110 mmol/L   CO2 26 20 - 32 mmol/L   Calcium  9.7 8.6 - 10.3 mg/dL   Total Protein 6.6 6.1 - 8.1 g/dL   Albumin 4.1 3.6 - 5.1 g/dL   Globulin 2.5 1.9 - 3.7 g/dL (calc)   AG Ratio 1.6 1.0 - 2.5 (calc)   Total Bilirubin 1.3 (H) 0.2 - 1.2 mg/dL   Alkaline phosphatase (APISO) 105 35 - 144 U/L   AST 20 10 - 35 U/L   ALT 23 9 - 46 U/L  PSA   Collection Time: 06/12/24  8:48 AM  Result Value Ref Range   PSA <0.04 < OR = 4.00 ng/mL  CBC with Differential/Platelet   Collection Time: 06/12/24  8:48 AM  Result Value Ref Range   WBC 7.4 3.8 - 10.8 Thousand/uL   RBC 4.97 4.20 - 5.80 Million/uL   Hemoglobin 15.1 13.2 - 17.1 g/dL   HCT 54.2 61.4 - 49.9 %   MCV 92.0 80.0 - 100.0 fL   MCH 30.4 27.0 - 33.0 pg   MCHC 33.0 32.0 - 36.0 g/dL   RDW 85.3 88.9 - 84.9 %   Platelets 203 140 - 400 Thousand/uL   MPV 10.3 7.5 - 12.5 fL   Neutro Abs 4,566 1,500 - 7,800 cells/uL   Absolute Lymphocytes 1,991 850 - 3,900 cells/uL   Absolute Monocytes 666 200 - 950 cells/uL   Eosinophils Absolute 141 15 - 500 cells/uL   Basophils Absolute 37 0 - 200 cells/uL   Neutrophils Relative % 61.7 %   Total Lymphocyte 26.9 %   Monocytes Relative 9.0 %   Eosinophils Relative 1.9 %   Basophils Relative 0.5 %  Hemoglobin A1c   Collection Time: 06/12/24  8:48 AM  Result Value Ref Range   Hgb A1c MFr Bld 6.6 (H) <5.7 %   Mean Plasma Glucose 143 mg/dL   eAG (mmol/L) 7.9 mmol/L  Lipid panel   Collection Time: 06/12/24  8:48 AM  Result Value Ref Range   Cholesterol 110 <200 mg/dL   HDL 52 > OR = 40 mg/dL   Triglycerides 42 <849 mg/dL   LDL Cholesterol (Calc) 46 mg/dL (calc)   Total CHOL/HDL Ratio 2.1 <5.0 (calc)   Non-HDL Cholesterol (Calc) 58 <869 mg/dL  (calc)      Assessment & Plan:   Problem List Items Addressed This Visit     Hyperlipidemia   Pre-diabetes   Primary osteoarthritis involving multiple joints   Rheumatoid arthritis involving right wrist (HCC)   Other Visit Diagnoses       Annual physical exam    -  Primary     Heart failure with reduced ejection fraction (HCC)         Need for Streptococcus pneumoniae vaccination       Relevant Orders   Pneumococcal conjugate vaccine 20-valent (Completed)     Gynecomastia         Seasonal allergic rhinitis due to other allergic trigger            Updated Health Maintenance information Reviewed recent lab results with patient Encouraged improvement to lifestyle with diet and exercise Goal of weight loss   Adult Wellness Visit Annual wellness visit conducted. Discussed recent lab results and vaccinations. - Administered Prevnar 20 vaccine today. - Discussed RSV vaccine with pharmacist. - Scheduled follow-up in six months for A1c check.  Pre-Diabetes at risk of DM A1c increased to 6.6, indicating early diabetes. Emphasized maintaining A1c below 6.5 to prevent progression. - Scheduled follow-up in six months for A1c check. - Encouraged dietary modifications to reduce sugar intake.  Heart failure with reduced ejection fraction Heart failure managed with spironolactone , Farxiga , and Tresto. No significant fluid retention observed. - Continue current heart failure medications.  Primary generalized osteoarthritis and bursitis, left shoulder Chronic left shoulder pain likely due to osteoarthritis and bursitis. Discussed potential referral to orthopedics if symptoms worsen. - Will consider referral to orthopedics if symptoms worsen.  Gynecomastia (medication-induced) R sided excess breast tissue, non tender. Asymptomatic. No obvious nodular density Suspected Gynecomastia likely induced by spironolactone . Discussed benign nature and importance of continuing heart  medication. - Continue spironolactone . - Will consider mammogram if symptoms develop.  Allergic rhinitis Managed with over-the-counter nasal  spray. - Continue using over-the-counter nasal spray.  General Health Maintenance Discussed importance of vaccinations and screenings. Reviewed recent vaccinations and recommended updates. - Administered Prevnar 20 vaccine today. - Discussed RSV vaccine with pharmacist. - Encouraged regular exercise and healthy lifestyle choices.         Orders Placed This Encounter  Procedures   Pneumococcal conjugate vaccine 20-valent    No orders of the defined types were placed in this encounter.    Follow up plan: Return for 6 month PreDM A1c.   Marsa Officer, DO [facility] 06/19/2024, 9:11 AM

## 2024-06-25 ENCOUNTER — Other Ambulatory Visit: Payer: Self-pay | Admitting: Family Medicine

## 2024-06-25 DIAGNOSIS — I5020 Unspecified systolic (congestive) heart failure: Secondary | ICD-10-CM

## 2024-06-25 DIAGNOSIS — L089 Local infection of the skin and subcutaneous tissue, unspecified: Secondary | ICD-10-CM

## 2024-06-28 NOTE — Telephone Encounter (Signed)
 Requested medication (s) are due for refill today: see below  Requested medication (s) are on the active medication list: no - mupirocin  ointment, doxycyline, yes- farxiga   Last refill:  farxiga - 03/28/24 #90 0 refills  Future visit scheduled: yes 12/18/24  Notes to clinic: mupirocin  and doxycyline discontinued 01/02/24-  medications not assigned to a protocol.  Do you want to continue farxiga  refills? No refills remain.     Requested Prescriptions  Pending Prescriptions Disp Refills   mupirocin  ointment (BACTROBAN ) 2 % [Pharmacy Med Name: MUPIROCIN  2% OINTMENT] 22 g 0    Sig: Apply 1 Application topically 2 (two) times daily. For up to 10 days or until healed, may repeat if needed.     Off-Protocol Failed - 06/28/2024 11:20 AM      Failed - Medication not assigned to a protocol, review manually.      Passed - Valid encounter within last 12 months    Recent Outpatient Visits           1 week ago Annual physical exam   [place] [facility] [place], Marsa PARAS, DO   5 months ago Heart failure with reduced ejection fraction [facility])   [place] [facility] [facility], DO   6 months ago Infected epidermoid cyst   [place] [facility] [place] [facility]a PARAS, DO   8 months ago Acute deep vein thrombosis (DVT) of proximal vein of right lower extremity [facility])   [place] [facility], Marsa PARAS, DO               doxycycline  (VIBRAMYCIN ) 100 MG capsule [Pharmacy Med Name: DOXYCYCLINE  HYCLATE 100 MG CAP] 20 capsule     Sig: TAKE 1 TABLET (100 MG TOTAL) BY MOUTH 2 (TWO) TIMES DAILY. FOR 10 DAYS. TAKE WITH FULL GLASS OF WATER , STAY UPRIGHT 30 MIN AFTER TAKING.     Off-Protocol Failed - 06/28/2024 11:20 AM      Failed - Medication not assigned to a protocol, review manually.      Passed - Valid encounter within last 12 months    Recent Outpatient Visits           1 week ago  Annual physical exam   [place] [facility] [place], Marsa PARAS, DO   5 months ago Heart failure with reduced ejection fraction [facility])   [place] [facility] [place], Marsa PARAS, DO   6 months ago Infected epidermoid cyst   [place] [facility] [facility] PARAS, DO   8 months ago Acute deep vein thrombosis (DVT) of proximal vein of right lower extremity [facility])   [place] [facility], Marsa PARAS, DO               FARXIGA  10 MG TABS tablet [Pharmacy Med Name: FARXIGA  10 MG TABLET] 90 tablet 0    Sig: TAKE 1 TABLET BY MOUTH EVERY DAY     Endocrinology:  Diabetes - SGLT2 Inhibitors Passed - 06/28/2024 11:20 AM      Passed - Cr in normal range and within 360 days    Creat  Date Value Ref Range Status  06/12/2024 0.97 0.70 - 1.22 mg/dL Final         Passed - HBA1C is between 0 and 7.9 and within 180 days    Hgb A1c MFr Bld  Date Value Ref Range Status  06/12/2024 6.6 (H) <5.7 %  Final    Comment:    For someone without known diabetes, a hemoglobin A1c value of 6.5% or greater indicates that they may have  diabetes and this should be confirmed with a follow-up  test. . For someone with known diabetes, a value <7% indicates  that their diabetes is well controlled and a value  greater than or equal to 7% indicates suboptimal  control. A1c targets should be individualized based on  duration of diabetes, age, comorbid conditions, and  other considerations. . Currently, no consensus exists regarding use of hemoglobin A1c for diagnosis of diabetes for children. .          Passed - eGFR in normal range and within 360 days    GFR, Est African American  Date Value Ref Range Status  04/27/2020 99 > OR = 60 mL/min/1.51m2 Final   GFR calc Af Amer  Date Value Ref Range Status  05/18/2021 119.5  Final   GFR, Est Non African American  Date Value Ref Range Status  04/27/2020 85  > OR = 60 mL/min/1.36m2 Final   GFR, Estimated  Date Value Ref Range Status  09/22/2023 >60 >60 mL/min Final    Comment:    (NOTE) Calculated using the CKD-EPI Creatinine Equation (2021)    eGFR  Date Value Ref Range Status  06/12/2024 78 > OR = 60 mL/min/1.38m2 Final         Passed - Valid encounter within last 6 months    Recent Outpatient Visits           1 week ago Annual physical exam   [place] [facility], [facility] PARAS, DO   5 months ago Heart failure with reduced ejection fraction [facility])   [place] [facility] [place], Marsa PARAS, DO   6 months ago Infected epidermoid cyst   [place] [facility], Marsa PARAS, DO   8 months ago Acute deep vein thrombosis (DVT) of proximal vein of right lower extremity [facility])   [place] [facility] [place], [place]AS, [place]

## 2024-07-03 ENCOUNTER — Other Ambulatory Visit: Payer: Self-pay | Admitting: Family Medicine

## 2024-07-03 DIAGNOSIS — I5020 Unspecified systolic (congestive) heart failure: Secondary | ICD-10-CM

## 2024-07-03 NOTE — Telephone Encounter (Signed)
 Copied from CRM [id number]. Topic: Clinical - Medication Refill >> Jul 03, 2024 12:31 PM Shanda MATSU wrote: Medication: dapagliflozin  propanediol (FARXIGA ) 10 MG TABS tablet  Has the patient contacted their pharmacy? Yes, no refills on file.  (Agent: If no, request that the patient contact the pharmacy for the refill. If patient does not wish to contact the pharmacy document the reason why and proceed with request.) (Agent: If yes, when and what did the pharmacy advise?)  This is the patient's preferred pharmacy:  [id number] - [place], [place] - [address] [place] [address] Phone: [phone] Fax: [phone]  Is this the correct pharmacy for this prescription? Yes If no, delete pharmacy and type the correct one.   Has the prescription been filled recently? No  Is the patient out of the medication? No  Has the patient been seen for an appointment in the last year OR does the patient have an upcoming appointment? Yes  Can we respond through MyChart? Yes  Agent: Please be advised that Rx refills may take up to 3 business days. We ask that you follow-up with your pharmacy.

## 2024-07-08 ENCOUNTER — Telehealth: Payer: Self-pay | Admitting: Family Medicine

## 2024-07-08 DIAGNOSIS — I5020 Unspecified systolic (congestive) heart failure: Secondary | ICD-10-CM

## 2024-07-08 MED ORDER — DAPAGLIFLOZIN PROPANEDIOL 10 MG PO TABS: 10.0000 mg | ORAL_TABLET | Freq: Every day | ORAL | 0 refills | Status: AC

## 2024-07-08 NOTE — Telephone Encounter (Signed)
 Prescription Request  07/08/2024  LOV: 06/19/2024  What is the name of the medication or equipment?   dapagliflozin  propanediol (FARXIGA ) 10 MG TABS tablet    Have you contacted your pharmacy to request a refill? Yes   Which pharmacy would you like this sent to?  [id number] - [place], [place] - [address] [place] [address] Phone: [phone] Fax: [phone]  [facility] [address] [place] [facility] [address] [place] [address] Phone: [phone] Fax: [phone]    Patient notified that their request is being sent to the clinical staff for review and that they should receive a response within 2 business days.   Please advise at [facility] [phone]

## 2024-07-09 NOTE — Telephone Encounter (Signed)
 Duplicate request, refilled on 07/08/24.  Requested Prescriptions  Pending Prescriptions Disp Refills   dapagliflozin  propanediol (FARXIGA ) 10 MG TABS tablet 90 tablet 0    Sig: Take 1 tablet (10 mg total) by mouth daily.     Endocrinology:  Diabetes - SGLT2 Inhibitors Passed - 07/09/2024  8:54 AM      Passed - Cr in normal range and within 360 days    Creat  Date Value Ref Range Status  06/12/2024 0.97 0.70 - 1.22 mg/dL Final         Passed - HBA1C is between 0 and 7.9 and within 180 days    Hgb A1c MFr Bld  Date Value Ref Range Status  06/12/2024 6.6 (H) <5.7 % Final    Comment:    For someone without known diabetes, a hemoglobin A1c value of 6.5% or greater indicates that they may have  diabetes and this should be confirmed with a follow-up  test. . For someone with known diabetes, a value <7% indicates  that their diabetes is well controlled and a value  greater than or equal to 7% indicates suboptimal  control. A1c targets should be individualized based on  duration of diabetes, age, comorbid conditions, and  other considerations. . Currently, no consensus exists regarding use of hemoglobin A1c for diagnosis of diabetes for children. .          Passed - eGFR in normal range and within 360 days    GFR, Est African American  Date Value Ref Range Status  04/27/2020 99 > OR = 60 mL/min/1.6m2 Final   GFR calc Af Amer  Date Value Ref Range Status  05/18/2021 119.5  Final   GFR, Est Non African American  Date Value Ref Range Status  04/27/2020 85 > OR = 60 mL/min/1.27m2 Final   GFR, Estimated  Date Value Ref Range Status  09/22/2023 >60 >60 mL/min Final    Comment:    (NOTE) Calculated using the CKD-EPI Creatinine Equation (2021)    eGFR  Date Value Ref Range Status  06/12/2024 78 > OR = 60 mL/min/1.50m2 Final         Passed - Valid encounter within last 6 months    Recent Outpatient Visits           2 weeks ago Annual physical exam   [place]  [facility] [place], Marsa PARAS, DO   6 months ago Heart failure with reduced ejection fraction [facility])   [place] [facility] [facility]   7 months ago Infected epidermoid cyst   [place] [facility] [facility], [facility]   9 months ago Acute deep vein thrombosis (DVT) of proximal vein of right lower extremity [facility])   [place] [facility] [place], [place]AS, [place]

## 2024-08-06 ENCOUNTER — Ambulatory Visit

## 2024-08-06 DIAGNOSIS — I5020 Unspecified systolic (congestive) heart failure: Secondary | ICD-10-CM | POA: Diagnosis not present

## 2024-08-06 LAB — ECHOCARDIOGRAM COMPLETE
AR max vel: 1.08 cm2
AV Area VTI: 1.15 cm2
AV Area mean vel: 1.02 cm2
AV Mean grad: 5.2 mmHg
AV Peak grad: 10.4 mmHg
Ao pk vel: 1.61 m/s
Area-P 1/2: 5.38 cm2
MV VTI: 1.58 cm2
S' Lateral: 2.3 cm

## 2024-09-03 ENCOUNTER — Ambulatory Visit (HOSPITAL_COMMUNITY): Payer: Self-pay | Admitting: Cardiology

## 2024-12-18 ENCOUNTER — Ambulatory Visit: Admitting: Family Medicine

## 2025-02-21 ENCOUNTER — Ambulatory Visit

## 2025-02-26 ENCOUNTER — Ambulatory Visit
